# Patient Record
Sex: Female | Born: 1955 | Hispanic: No | Smoking: Never smoker
Health system: Southern US, Community
[De-identification: ages and names within clinical notes are randomized; demographics above are authoritative.]

## PROBLEM LIST (undated history)

## (undated) DIAGNOSIS — L409 Psoriasis, unspecified: Secondary | ICD-10-CM

## (undated) DIAGNOSIS — I5032 Chronic diastolic (congestive) heart failure: Secondary | ICD-10-CM

## (undated) DIAGNOSIS — E785 Hyperlipidemia, unspecified: Secondary | ICD-10-CM

## (undated) DIAGNOSIS — I10 Essential (primary) hypertension: Secondary | ICD-10-CM

## (undated) HISTORY — PX: ANAL FISSURE REPAIR: SHX2312

## (undated) HISTORY — PX: ENDOMETRIAL ABLATION: SHX621

---

## 1998-01-13 ENCOUNTER — Ambulatory Visit (HOSPITAL_COMMUNITY): Admission: RE | Admit: 1998-01-13 | Discharge: 1998-01-13 | Payer: Self-pay | Admitting: Surgery

## 1998-02-27 ENCOUNTER — Ambulatory Visit (HOSPITAL_COMMUNITY): Admission: RE | Admit: 1998-02-27 | Discharge: 1998-02-27 | Payer: Self-pay | Admitting: Surgery

## 1999-09-06 ENCOUNTER — Other Ambulatory Visit: Admission: RE | Admit: 1999-09-06 | Discharge: 1999-09-06 | Payer: Self-pay

## 2000-08-25 ENCOUNTER — Emergency Department (HOSPITAL_COMMUNITY): Admission: EM | Admit: 2000-08-25 | Discharge: 2000-08-26 | Payer: Self-pay | Admitting: Emergency Medicine

## 2001-01-12 ENCOUNTER — Encounter: Admission: RE | Admit: 2001-01-12 | Discharge: 2001-04-12 | Payer: Self-pay

## 2002-10-01 ENCOUNTER — Emergency Department (HOSPITAL_COMMUNITY): Admission: EM | Admit: 2002-10-01 | Discharge: 2002-10-01 | Payer: Self-pay | Admitting: *Deleted

## 2003-10-30 ENCOUNTER — Other Ambulatory Visit: Admission: RE | Admit: 2003-10-30 | Discharge: 2003-10-30 | Payer: Self-pay | Admitting: Obstetrics and Gynecology

## 2003-12-08 ENCOUNTER — Ambulatory Visit (HOSPITAL_COMMUNITY): Admission: RE | Admit: 2003-12-08 | Discharge: 2003-12-08 | Payer: Self-pay | Admitting: Obstetrics and Gynecology

## 2003-12-08 ENCOUNTER — Encounter (INDEPENDENT_AMBULATORY_CARE_PROVIDER_SITE_OTHER): Payer: Self-pay | Admitting: Specialist

## 2004-12-01 ENCOUNTER — Emergency Department (HOSPITAL_COMMUNITY): Admission: EM | Admit: 2004-12-01 | Discharge: 2004-12-01 | Payer: Self-pay | Admitting: Emergency Medicine

## 2006-04-09 ENCOUNTER — Emergency Department (HOSPITAL_COMMUNITY): Admission: EM | Admit: 2006-04-09 | Discharge: 2006-04-09 | Payer: Self-pay | Admitting: Emergency Medicine

## 2006-04-14 ENCOUNTER — Emergency Department (HOSPITAL_COMMUNITY): Admission: EM | Admit: 2006-04-14 | Discharge: 2006-04-14 | Payer: Self-pay | Admitting: Emergency Medicine

## 2006-11-12 ENCOUNTER — Emergency Department (HOSPITAL_COMMUNITY): Admission: EM | Admit: 2006-11-12 | Discharge: 2006-11-12 | Payer: Self-pay | Admitting: Emergency Medicine

## 2008-09-02 ENCOUNTER — Emergency Department (HOSPITAL_COMMUNITY): Admission: EM | Admit: 2008-09-02 | Discharge: 2008-09-03 | Payer: Self-pay | Admitting: Emergency Medicine

## 2009-03-17 ENCOUNTER — Emergency Department (HOSPITAL_COMMUNITY): Admission: EM | Admit: 2009-03-17 | Discharge: 2009-03-17 | Payer: Self-pay | Admitting: Emergency Medicine

## 2009-03-29 ENCOUNTER — Inpatient Hospital Stay (HOSPITAL_COMMUNITY): Admission: EM | Admit: 2009-03-29 | Discharge: 2009-03-31 | Payer: Self-pay | Admitting: Emergency Medicine

## 2009-03-29 ENCOUNTER — Encounter (INDEPENDENT_AMBULATORY_CARE_PROVIDER_SITE_OTHER): Payer: Self-pay | Admitting: Internal Medicine

## 2009-03-30 ENCOUNTER — Encounter (INDEPENDENT_AMBULATORY_CARE_PROVIDER_SITE_OTHER): Payer: Self-pay | Admitting: Internal Medicine

## 2009-03-30 ENCOUNTER — Ambulatory Visit: Payer: Self-pay | Admitting: Physical Medicine & Rehabilitation

## 2009-03-31 ENCOUNTER — Ambulatory Visit: Payer: Self-pay | Admitting: Physical Medicine & Rehabilitation

## 2009-03-31 ENCOUNTER — Inpatient Hospital Stay (HOSPITAL_COMMUNITY)
Admission: RE | Admit: 2009-03-31 | Discharge: 2009-04-09 | Payer: Self-pay | Admitting: Physical Medicine & Rehabilitation

## 2009-04-09 ENCOUNTER — Encounter: Payer: Self-pay | Admitting: Family Medicine

## 2009-05-11 ENCOUNTER — Encounter
Admission: RE | Admit: 2009-05-11 | Discharge: 2009-05-13 | Payer: Self-pay | Admitting: Physical Medicine & Rehabilitation

## 2009-05-12 ENCOUNTER — Ambulatory Visit: Payer: Self-pay | Admitting: Physical Medicine & Rehabilitation

## 2009-07-17 ENCOUNTER — Encounter: Payer: Self-pay | Admitting: *Deleted

## 2009-08-06 ENCOUNTER — Encounter: Payer: Self-pay | Admitting: Family Medicine

## 2009-08-06 ENCOUNTER — Ambulatory Visit: Payer: Self-pay | Admitting: Family Medicine

## 2009-08-06 DIAGNOSIS — I152 Hypertension secondary to endocrine disorders: Secondary | ICD-10-CM | POA: Insufficient documentation

## 2009-08-06 DIAGNOSIS — Z794 Long term (current) use of insulin: Secondary | ICD-10-CM

## 2009-08-06 DIAGNOSIS — L2089 Other atopic dermatitis: Secondary | ICD-10-CM | POA: Insufficient documentation

## 2009-08-06 DIAGNOSIS — E1165 Type 2 diabetes mellitus with hyperglycemia: Secondary | ICD-10-CM | POA: Insufficient documentation

## 2009-08-06 DIAGNOSIS — I10 Essential (primary) hypertension: Secondary | ICD-10-CM | POA: Insufficient documentation

## 2009-08-06 LAB — CONVERTED CEMR LAB
ALT: 10 units/L (ref 0–35)
AST: 11 units/L (ref 0–37)
Albumin: 4.4 g/dL (ref 3.5–5.2)
Alkaline Phosphatase: 73 units/L (ref 39–117)
BUN: 12 mg/dL (ref 6–23)
CO2: 27 meq/L (ref 19–32)
Calcium: 9.4 mg/dL (ref 8.4–10.5)
Chloride: 103 meq/L (ref 96–112)
Creatinine, Ser: 0.68 mg/dL (ref 0.40–1.20)
Glucose, Bld: 189 mg/dL — ABNORMAL HIGH (ref 70–99)
Hgb A1c MFr Bld: 8 %
Potassium: 4.5 meq/L (ref 3.5–5.3)
Sodium: 140 meq/L (ref 135–145)
Total Bilirubin: 0.3 mg/dL (ref 0.3–1.2)
Total Protein: 7.7 g/dL (ref 6.0–8.3)

## 2009-08-25 ENCOUNTER — Ambulatory Visit: Payer: Self-pay | Admitting: Family Medicine

## 2009-08-25 DIAGNOSIS — R609 Edema, unspecified: Secondary | ICD-10-CM | POA: Insufficient documentation

## 2009-08-25 DIAGNOSIS — K6289 Other specified diseases of anus and rectum: Secondary | ICD-10-CM | POA: Insufficient documentation

## 2009-09-08 ENCOUNTER — Telehealth: Payer: Self-pay | Admitting: *Deleted

## 2009-09-24 ENCOUNTER — Telehealth: Payer: Self-pay | Admitting: Family Medicine

## 2009-09-24 DIAGNOSIS — M79609 Pain in unspecified limb: Secondary | ICD-10-CM | POA: Insufficient documentation

## 2009-12-02 ENCOUNTER — Ambulatory Visit: Payer: Self-pay | Admitting: Family Medicine

## 2009-12-02 DIAGNOSIS — R269 Unspecified abnormalities of gait and mobility: Secondary | ICD-10-CM | POA: Insufficient documentation

## 2009-12-02 DIAGNOSIS — F411 Generalized anxiety disorder: Secondary | ICD-10-CM | POA: Insufficient documentation

## 2009-12-02 LAB — CONVERTED CEMR LAB: Hgb A1c MFr Bld: 11.4 %

## 2009-12-18 ENCOUNTER — Encounter: Payer: Self-pay | Admitting: *Deleted

## 2010-01-13 ENCOUNTER — Telehealth: Payer: Self-pay | Admitting: *Deleted

## 2010-01-13 ENCOUNTER — Ambulatory Visit: Payer: Self-pay | Admitting: Family Medicine

## 2010-01-13 DIAGNOSIS — Z853 Personal history of malignant neoplasm of breast: Secondary | ICD-10-CM | POA: Insufficient documentation

## 2010-01-14 ENCOUNTER — Telehealth: Payer: Self-pay | Admitting: *Deleted

## 2010-01-29 ENCOUNTER — Encounter: Payer: Self-pay | Admitting: *Deleted

## 2010-02-05 ENCOUNTER — Ambulatory Visit: Payer: Self-pay | Admitting: Family Medicine

## 2010-02-05 DIAGNOSIS — H9190 Unspecified hearing loss, unspecified ear: Secondary | ICD-10-CM | POA: Insufficient documentation

## 2010-02-05 DIAGNOSIS — R32 Unspecified urinary incontinence: Secondary | ICD-10-CM | POA: Insufficient documentation

## 2010-02-05 DIAGNOSIS — R413 Other amnesia: Secondary | ICD-10-CM | POA: Insufficient documentation

## 2010-02-10 ENCOUNTER — Telehealth: Payer: Self-pay | Admitting: Family Medicine

## 2010-02-18 ENCOUNTER — Encounter: Payer: Self-pay | Admitting: *Deleted

## 2010-03-23 ENCOUNTER — Encounter: Payer: Self-pay | Admitting: Family Medicine

## 2010-03-23 ENCOUNTER — Encounter: Payer: Self-pay | Admitting: *Deleted

## 2010-03-23 ENCOUNTER — Ambulatory Visit: Payer: Self-pay | Admitting: Family Medicine

## 2010-03-23 DIAGNOSIS — E785 Hyperlipidemia, unspecified: Secondary | ICD-10-CM | POA: Insufficient documentation

## 2010-03-23 LAB — CONVERTED CEMR LAB
Cholesterol: 331 mg/dL — ABNORMAL HIGH (ref 0–200)
HDL: 46 mg/dL (ref >39)
Hgb A1c MFr Bld: 13.8 %
LDL Cholesterol: 245 mg/dL — ABNORMAL HIGH (ref 0–99)
Total CHOL/HDL Ratio: 7.2
Triglycerides: 201 mg/dL — ABNORMAL HIGH (ref <150)
VLDL: 40 mg/dL (ref 0–40)

## 2010-03-24 ENCOUNTER — Encounter: Payer: Self-pay | Admitting: Family Medicine

## 2010-03-25 ENCOUNTER — Telehealth: Payer: Self-pay | Admitting: *Deleted

## 2010-03-25 ENCOUNTER — Telehealth: Payer: Self-pay | Admitting: Family Medicine

## 2010-03-29 ENCOUNTER — Encounter: Payer: Self-pay | Admitting: *Deleted

## 2010-04-06 ENCOUNTER — Telehealth: Payer: Self-pay | Admitting: *Deleted

## 2010-04-09 ENCOUNTER — Encounter: Payer: Self-pay | Admitting: *Deleted

## 2010-04-13 ENCOUNTER — Encounter: Payer: Self-pay | Admitting: Family Medicine

## 2010-05-12 ENCOUNTER — Encounter (INDEPENDENT_AMBULATORY_CARE_PROVIDER_SITE_OTHER): Payer: Self-pay | Admitting: *Deleted

## 2010-05-22 ENCOUNTER — Inpatient Hospital Stay (HOSPITAL_COMMUNITY)
Admission: AD | Admit: 2010-05-22 | Discharge: 2010-05-22 | Payer: Self-pay | Source: Home / Self Care | Attending: Obstetrics & Gynecology | Admitting: Obstetrics & Gynecology

## 2010-07-03 ENCOUNTER — Encounter: Payer: Self-pay | Admitting: Family Medicine

## 2010-07-04 ENCOUNTER — Encounter: Payer: Self-pay | Admitting: Family Medicine

## 2010-07-08 ENCOUNTER — Telehealth: Payer: Self-pay | Admitting: *Deleted

## 2010-07-12 ENCOUNTER — Encounter: Payer: Self-pay | Admitting: *Deleted

## 2010-07-13 NOTE — Progress Notes (Signed)
Summary: referral  Phone Note Call from Patient Call back at Home Phone [phone]   Caller: Patient Summary of Call: wants a referral to go to [facility] Initial call taken by: De Nurse,  March 25, 2010 11:59 AM     Appended Document: referral appt made pt notified

## 2010-07-13 NOTE — Progress Notes (Signed)
Summary: referral to podiatry done  Phone Note Call from Patient Call back at Home Phone [phone] Call back at [phone]   Caller: Patient Summary of Call: [facility]. Initial call taken by: De Nurse,  September 24, 2009 12:00 PM  Follow-up for Phone Call        tried to call back but phone number is invalid  message states. will MD make referral or should patient be seen here first. Follow-up by: Theresia Lo RN,  September 24, 2009 3:33 PM  Additional Follow-up for Phone Call Additional follow up Details #1::        She has been seen with toe tingling recently. Likely due to diabetic neuropathy but it is reasonable to have her seen a podiatrist.  Additional Follow-up by: [name] MD,  September 28, 2009 10:29 AM  New Problems: TOE PAIN (ICD-729.5)   New Problems: TOE PAIN (ICD-729.5)

## 2010-07-13 NOTE — Assessment & Plan Note (Signed)
Summary: HTN, DM2   Vital Signs:  Patient profile:   55 year old female Height:      63.5 inches Weight:      257 pounds BMI:     44.97 Temp:     97.5 degrees F oral Pulse rate:   106 / minute BP sitting:   168 / 96  (left arm) Cuff size:   large  Vitals Entered By: Jimmy Footman, CMA (January 13, 2010 11:10 AM)  Serial Vital Signs/Assessments:  Time      Position  BP       Pulse  Resp  Temp     By                     152/92                         Jimmy Footman, CMA  Comments: manual left arm (large) By: Jimmy Footman, CMA   CC: f/u bp, concernes about blood sugar   Primary Care Provider:  [name] MD  CC:  f/u bp and concernes about blood sugar.  History of Present Illness: Hypertension: Pt has not been taking all the meds she is suppose to take. She says she only has money for a few meds. She is tkaing amlodipine but not Lisinopril or HCTZ.   DM2: Pt says she can not afford her Metformin and has not been taking that regularly. She is taking her Lantus but says that her CBG's ahve been running between 250-359. She is using 30 units of Lanus right now. We discussed going up. She is still taking the Gabapentin for neuropathy.    Preventive: Pt needs to get a mammogram and colonoscopy. She thought that her medicaid had run out but she still has it at this time so we will try to get this done before she loses insurance. She has not gotten [facility] certified yet but will continue to work on this.   Habits & Providers  Alcohol-Tobacco-Diet     Tobacco Status: never  Current Medications (verified): 1)  Ecotrin 325 Mg Tbec (Aspirin) .... Take Once Daily 2)  Amlodipine Besylate 10 Mg Tabs (Amlodipine Besylate) .... Take 1 Dialy For High Blood Pressure 3)  Lantus 100 Unit/ml Soln (Insulin Glargine) .... Take 30 Units At Bedtime 4)  Actos 30 Mg Tabs (Pioglitazone Hcl) .Marland Kitchen.. 1 Tab A Day 5)  Simvastatin 20 Mg Tabs (Simvastatin) .... Take 1 Daily 6)  Lisinopril 40 Mg Tabs  (Lisinopril) .... Take 1 Pill Daily 7)  Triamcinolone Acetonide 0.025 % Crea (Triamcinolone Acetonide) .... Apply To Affected Area Behind Ears But Not On Face. 15 Ounce Tube 8)  Hydrochlorothiazide 25 Mg Tabs (Hydrochlorothiazide) .... Take On Pill Daily 9)  Metformin Hcl 500 Mg Tabs (Metformin Hcl) .... Take One in The Morning and One in The Evening 10)  Prodigy Lancets 28g  Misc (Lancets) .... Use One Lancet To Test Blood Sugar 11)  Prodigy No Coding Blood Gluc  Strp (Glucose Blood) .... Use One Test Strip To Test Glucose 12)  Gabapentin 300 Mg Caps (Gabapentin) .... Take One Pill 3 Times A Day  Allergies (verified): No Known Drug Allergies  Review of Systems        vitals reviewed and pertinent negatives and positives seen in HPI   Physical Exam  General:  obese, in no acute distress; alert,appropriate and cooperative throughout examination, mildly dissheveled.  Lungs:  Normal respiratory effort,  chest expands symmetrically. Lungs are clear to auscultation, no crackles or wheezes. Heart:  Normal rate and regular rhythm. S1 and S2 normal without gallop, murmur, click, rub or other extra sounds. Psych:  pt does not appear to be making clear decisions about her health, she appears to be very forgetful as well   Impression & Recommendations:  Problem # 1:  ESSENTIAL HYPERTENSION, BENIGN (ICD-401.1) Assessment Deteriorated Pt's blood pressure is getting worse because she is not taking her HCTZ or Lisinopril. She says she can not afford her meds however these should both be on the 4 dollar plan.   Her updated medication list for this problem includes:    Amlodipine Besylate 10 Mg Tabs (Amlodipine besylate) .Marland Kitchen... Take 1 dialy for high blood pressure    Lisinopril 40 Mg Tabs (Lisinopril) .Marland Kitchen... Take 1 pill daily    Hydrochlorothiazide 25 Mg Tabs (Hydrochlorothiazide) .Marland Kitchen... Take on pill daily  Orders: [facility]- Est  Level 4 ([address])  Problem # 2:  DM (ICD-250.00) Assessment:  Deteriorated Pt says that she does not have any money to buy her Metformin. She is trying to get disability but it has been denied once. She is currently only taking the Lanuts. Discussed increasing the lantus to 35 units at bedtime. Encouraged pt to get Metformin as it is on the 4 dollar plan. Given new handwritten Rx so she can find a pharmacy with the best price and get her Rx filled.   Her updated medication list for this problem includes:    Ecotrin 325 Mg Tbec (Aspirin) .Marland Kitchen... Take once daily    Lantus 100 Unit/ml Soln (Insulin glargine) .Marland Kitchen... Take 35 units at bedtime    Actos 30 Mg Tabs (Pioglitazone hcl) .Marland Kitchen... 1 tab a day    Lisinopril 40 Mg Tabs (Lisinopril) .Marland Kitchen... Take 1 pill daily    Metformin Hcl 500 Mg Tabs (Metformin hcl) .Marland Kitchen... Take one in the morning and one in the evening  Orders: [facility]- Est  Level 4 ([address]214)Future Orders: Lipid-[facility] ([address]454-[address]11) ... 01/27/2011  Complete Medication List: 1)  Ecotrin 325 Mg Tbec (Aspirin) .... Take once daily 2)  Amlodipine Besylate 10 Mg Tabs (Amlodipine besylate) .... Take 1 dialy for high blood pressure 3)  Lantus 100 Unit/ml Soln (Insulin glargine) .... Take 35 units at bedtime 4)  Actos 30 Mg Tabs (Pioglitazone hcl) .Marland Kitchen.. 1 tab a day 5)  Simvastatin 20 Mg Tabs (Simvastatin) .... Take 1 daily 6)  Lisinopril 40 Mg Tabs (Lisinopril) .... Take 1 pill daily 7)  Triamcinolone Acetonide 0.025 % Crea (Triamcinolone acetonide) .... Apply to affected area behind ears but not on face. 15 ounce tube 8)  Hydrochlorothiazide 25 Mg Tabs (Hydrochlorothiazide) .... Take on pill daily 9)  Metformin Hcl 500 Mg Tabs (Metformin hcl) .... Take one in the morning and one in the evening 10)  Prodigy Lancets 28g Misc (Lancets) .... Use one lancet to test blood sugar 11)  Prodigy No Coding Blood Gluc Strp (Glucose blood) .... Use one test strip to test glucose 12)  Gabapentin 300 Mg Caps (Gabapentin) .... Take one pill 3 times a day  Other Orders: Mammogram  (Screening) (Mammo) Gastroenterology Referral (GI)  Patient Instructions: 1)  The Metformin is on the 4 dollar plan at [facility]. [facility].  2)  We will help set you up for a mammogram and colonoscopy.  3)  You will need to make a seperate appointment for a pap smear.  4)  You can come  any morning that you have not eaten to get fasting cholesterol labs. (you can not eat 8 hours before this test).

## 2010-07-13 NOTE — Assessment & Plan Note (Signed)
Summary: memory test/kh  CC: memory test and hearing test  Hearing Screen 25db HL: Left  500 hz: No Response 1000 hz: No Response 2000 hz: No Response 4000 hz: 25db Right  500 hz: No Response 1000 hz: No Response 2000 hz: No Response 4000 hz: 25db    Primary Care Provider:  [name] MD  CC:  memory test and hearing test.  History of Present Illness: Memory: PT comes in today for a mini-mental exam. She is trying to get disability and has been concerned about her ability to work because she has a hard time remembering. I wanted to do a MMSE to determine her degree of forgetfullness. 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 I am happy to write a letter documenting what I have found.   Hearing/Gait imbalance: Pt is having a hard time with balance. She is concerned that she is having some hearing loss.   Fecal incontinence: Pt c/o fecal incontinence. She says that she was at her mom's house when she realized that she was stooling on herself and had never even felt the urge to stool. She says she often wipes the stool from her anus and then the next time she goes to the bathroom she realizes that she has stool in her underwear.  She is concerned about her sphincter tone and also needs a screening colonoscopy. Will try to get both evaluated.     Habits & Providers  Alcohol-Tobacco-Diet     Tobacco Status: never  Allergies (verified): No Known Drug Allergies  Review of Systems       no reported complaints  Physical Exam  General:  Well-developed,well-nourished,in no acute distress; alert,appropriate and cooperative throughout examination Psych:  Cognition and judgment appear intact. Alert and cooperative with normal attention span and concentration. No apparent delusions, illusions, hallucinations   Impression & Recommendations:  Problem # 1:  MEMORY LOSS  (ICD-780.93) Assessment Unchanged Pt scored a 27/30 on the MMSE. She is able to remember things but it sometimes takes her a while to get the word out. I am willing to write a letter stating that her MMSE is 27 but I don't feel that this patient is unable to work because of this.    Orders: [facility]- Est Level  3 ([address])  Problem # 2:  UNSTEADY GAIT (ICD-781.2) Assessment: Deteriorated Pt has an unsteady gate. She has a h/o a CVA which she has mostly recovered from. However she feels that she may be unsteady because she has a hearing loss. After today's screening test I agree and plan to refer her to an audiologist for formal testing.   Orders: Hearing- [facility] (92551) [facility]- Est Level  3 [phone]) Audiology (Audio)  Problem # 3:  INCONTINENCE (ICD-788.30) Assessment: New Pt is concerned that she is lacking in sphincter tone. She is in need of a colonosocpy and I asked her to ask the GI MD questions about her stool incontinence at the colonoscopy. If there is nothing the GI doc wants to add  I would consider bulking up her diet with some fiber to see if we can add some volume to her stools so they stay more formed.   Orders: Colonoscopy (Colon) [facility]- Est Level  3 (09811)  Complete Medication List: 1)  Ecotrin 325 Mg Tbec (Aspirin) .... Take once daily 2)  Amlodipine Besylate 10 Mg Tabs (Amlodipine besylate) .... Take 1  dialy for high blood pressure 3)  Lantus 100 Unit/ml Soln (Insulin glargine) .... Take 35 units at bedtime 4)  Actos 30 Mg Tabs (Pioglitazone hcl) .Marland Kitchen.. 1 tab a day 5)  Simvastatin 20 Mg Tabs (Simvastatin) .... Take 1 daily 6)  Lisinopril 40 Mg Tabs (Lisinopril) .... Take 1 pill daily 7)  Triamcinolone Acetonide 0.025 % Crea (Triamcinolone acetonide) .... Apply to affected area behind ears but not on face. 15 ounce tube 8)  Hydrochlorothiazide 25 Mg Tabs (Hydrochlorothiazide) .... Take on pill daily 9)  Metformin Hcl 500 Mg Tabs (Metformin hcl) .... Take one in the morning and one in  the evening 10)  Prodigy Lancets 28g Misc (Lancets) .... Use one lancet to test blood sugar 11)  Prodigy No Coding Blood Gluc Strp (Glucose blood) .... Use one test strip to test glucose 12)  Gabapentin 300 Mg Caps (Gabapentin) .... Take one pill 3 times a day  Other Orders: Mammogram (Screening) (Mammo)  Patient Instructions: 1)  You did well on your mental memory test today.  2)  Please let me know if your lawyer needs a letter written.  3)  You got a sample Lanus pen today.  4)  You are getting a hearing test today.  5)  We will call you with info about a mammogram.

## 2010-07-13 NOTE — Progress Notes (Signed)
  Phone Note Outgoing Call   Call placed by: Jimmy Footman, CMA,  January 13, 2010 3:53 PM Summary of Call: Called pt to inform her of an appt set at [facility] on 8/15 @ 2:45. Pt phone is unable to accept any phone calls. Initial call taken by: Jimmy Footman, CMA,  January 13, 2010 3:54 PM

## 2010-07-13 NOTE — Letter (Signed)
Summary: Generic Letter  [facility]  [address]   [place], [place] [address]   Phone: [phone]  Fax: [phone]    03/23/2010  Whitney Sloan [address] [place], [place]  [address]  Dear [name],   I have made appointments for you for an Colonoscopy and a Mammogram.  Your colonscopy appoinment has been scheduled for March 30, 2010 at 10:00am with [facility] [address] [phone], [phone].   Your mammogram is scheduled for October 19,2011 at 4:40pm with [facility]  located at [address], ph. [phone].    If you cannot make either of these appoinments please be sure to call 24 hours in advance to cancell or reschedule.  Thank you in advance for your time and attention to this matter.     Sincerely,   Loralee Pacas CMA

## 2010-07-13 NOTE — Progress Notes (Signed)
Summary: Whitney Sloan  Phone Note Call from Patient Call back at Home Phone [phone] Call back at [phone]   Caller: Patient Summary of Call: Pt is checking on a couple of referrals that she was supposed to have.  One for a colonoscopy and one for [facility]. Initial call taken by: Clydell Hakim,  March 25, 2010 10:06 AM  Follow-up for Phone Call        Called and spoke with patient ad gave her the info about colonoscopy and mammogram. Patient informed me that she cannot make either one. I gave her the numbes to call and reschedule appts. Follow-up by: Jimmy Footman, CMA,  March 25, 2010 11:58 AM

## 2010-07-13 NOTE — Letter (Signed)
Summary: Generic Letter  [facility]  [address]   [place], [place] [address]   Phone: [phone]  Fax: [phone]    02/18/2010  Whitney Sloan [address] [place], [place]  [address]  Dear Ms. Pendergrass,     I have been unable to contact you by phone. Dr. Clotilde Dieter requested we schedule an appointment with GI specialist for you to have a screening colonscopy. An appointment has been scheduled with Dr. Elnoria Howard for 02/24/2010 at 8:30 AM. This is just to discuss the procedure and to get instructions for the prep. Dr. Haywood Pao office is located at [address] , [place]. Phone number is [phone]. If this is not convenient please call their office to reschedule.      Thank you.       Sincerely,   Theresia Lo RN

## 2010-07-13 NOTE — Miscellaneous (Signed)
Summary: Appt rescheduled  Patient was late for appt.  Sicne she did show up she was allowed to reschedule.  Dennison Nancy RN  July 17, 2009 11:28 AM

## 2010-07-13 NOTE — Assessment & Plan Note (Signed)
Summary: NEW PT, HTN, DM, Ezcema   Vital Signs:  Patient profile:   55 year old female Height:      63.5 inches Weight:      258. pounds BMI:     45.15 Temp:     97.4 degrees F oral Pulse rate:   90 / minute BP sitting:   164 / 90  (left arm) Cuff size:   large  Vitals Entered By: Gladstone Pih (August 06, 2009 10:16 AM) CC: NP est Is Patient Diabetic? Yes Did you bring your meter with you today? No Pain Assessment Patient in pain? no        Primary Care Provider:  [name] MD  CC:  NP est.  History of Present Illness: 55 y/o new patient comes in to establish care. She was seen in the [facility] in Oct, 2010 for left sided weakness. 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 HGA1c at the time was 12.5. She did 9 days of inpatient rehab and improved significantly. She mentions that she still walks a little more slowly than before and sometimes feels off balance and seems to fatigue more quickly than before but is doing much better than she was after her stroke.   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  Menopause 2007  Past Surgical History: C-section 1985 and 1993 Tubal ligation 1993 Endometrial ablation 2006  Family History: Father: unknown Mother: alive, age 55, no health problems that she knows of.  Sister: hypertension, sleep apnea  Social History: lives with daughter Annella Prowell 07-23-1991), no pets, not employed, SSDI case pending, likes puzzles, TV, movies, writing, reading, dancing, black and [place], some college, sometimes has  difficulty understanding medical info, divorced, heterosexual, owns car, renting house, no tobacco, no drugs, alcohol 1 drink a year, occasionally exercises at home (walks on treadmill), no h/o STI'sSmoking Status:  never  Review of Systems        vitals reviewed and pertinent negatives and positives seen in HPI   Physical Exam  General:  alert, well-hydrated, appropriate dress, cooperative to examination, and overweight-appearing.   Head:  Normocephalic and atraumatic without obvious abnormalities. No apparent alopecia or balding. Eyes:  No corneal or conjunctival inflammation noted. EOMI. Perrla. Funduscopic exam benign, without hemorrhages, exudates or papilledema. Vision grossly normal. Ears:  External ear exam shows no significant lesions or deformities.  Otoscopic examination reveals clear canals, tympanic membranes are intact bilaterally without bulging, retraction, inflammation or discharge. Hearing is grossly normal bilaterally. Nose:  External nasal examination shows no deformity or inflammation. Nasal mucosa are pink and moist without lesions or exudates. Mouth:  Oral mucosa and oropharynx without lesions or exudates.  Teeth in good repair. Lungs:  Normal respiratory effort, chest expands symmetrically. Lungs are clear to auscultation, no crackles or wheezes. Heart:  Normal rate and regular rhythm. S1 and S2 normal without gallop, murmur, click, rub or other extra sounds. Abdomen:  Bowel sounds positive,abdomen soft and non-tender without masses, organomegaly or hernias noted. obese Skin:  ezcema rash behind bilateral ears Psych:  Cognition and judgment appear intact. Alert and cooperative with normal attention span and concentration.  No apparent delusions, illusions, hallucinations   Impression & Recommendations:  Problem # 1:  DM (ICD-250.00) Assessment Improved Pt is here for the first time today. Her A1c is better than her hospital record A1c in Oct 2010. She has done well on  the insulin and I would encourage her to stay on it for a while (even though she would like to stay off of it for a while if her numbers come down).   Her updated medication list for this problem includes:    Ecotrin 325 Mg Tbec (Aspirin) .Marland Kitchen... Take once daily    Lantus 100 Unit/ml Soln (Insulin glargine) .Marland Kitchen... Take 30 units at bedtime    Actos 30 Mg Tabs (Pioglitazone hcl) .Marland Kitchen... 1 tab a day    Lisinopril 40 Mg Tabs (Lisinopril) .Marland Kitchen... Take 1 pill daily  Orders: A1C-[facility] ([address]) Comp Met-[facility] ([address])  Problem # 2:  ESSENTIAL HYPERTENSION, BENIGN (ICD-401.1) Assessment: Deteriorated Pt has an elevated BP. Plan to recheck at next visit and if needed add a 3rd agent.   Her updated medication list for this problem includes:    Amlodipine Besylate 10 Mg Tabs (Amlodipine besylate) .Marland Kitchen... Take 1 dialy for hig blood pressure    Lisinopril 40 Mg Tabs (Lisinopril) .Marland Kitchen... Take 1 pill daily  Problem # 3:  DERMATITIS, ATOPIC (ICD-691.8) Assessment: Deteriorated Pt has skin lesions behind her ears. Says she use to use this cream and would like to use it again.   Her updated medication list for this problem includes:    Triamcinolone Acetonide 0.025 % Crea (Triamcinolone acetonide) .Marland Kitchen... Apply to affected area behind ears but not on face.  Medications Added to Medication List This Visit: 1)  Ecotrin 325 Mg Tbec (Aspirin) .... Take once daily 2)  Amlodipine Besylate 10 Mg Tabs (Amlodipine besylate) .... Take 1 dialy for hig blood pressure 3)  Lantus 100 Unit/ml Soln (Insulin glargine) .... Take 30 units at bedtime 4)  Actos 30 Mg Tabs (Pioglitazone hcl) .Marland Kitchen.. 1 tab a day 5)  Simvastatin 20 Mg Tabs (Simvastatin) .... Take 1 daily 6)  Lisinopril 40 Mg Tabs (Lisinopril) .... Take 1 pill daily 7)  Triamcinolone Acetonide 0.025 % Crea (Triamcinolone acetonide) .... Apply to affected area behind ears but not on face.  Complete Medication List: 1)  Ecotrin 325 Mg Tbec (Aspirin) .... Take once daily 2)   Amlodipine Besylate 10 Mg Tabs (Amlodipine besylate) .... Take 1 dialy for hig blood pressure 3)  Lantus 100 Unit/ml Soln (Insulin glargine) .... Take 30 units at bedtime 4)  Actos 30 Mg Tabs (Pioglitazone hcl) .Marland Kitchen.. 1 tab a day 5)  Simvastatin 20 Mg Tabs (Simvastatin) .... Take 1 daily 6)  Lisinopril 40 Mg Tabs (Lisinopril) .... Take 1 pill daily 7)  Triamcinolone Acetonide 0.025 % Crea (Triamcinolone acetonide) .... Apply to affected area behind ears but not on face.  Other Orders: Tdap => 1yrs IM (91478) Admin 1st Vaccine (29562)  Patient Instructions: 1)  Amek an appointment for 1-2 weeks.  2)  We will look at your fissue at that time.  3)  Make an appointment to get a mammogram.  4)  Keep your sugar log, bring with you to next appointment.  Prescriptions: TRIAMCINOLONE ACETONIDE 0.025 % CREA (TRIAMCINOLONE ACETONIDE) apply to affected area behind ears but not on face.  #1 x 1   Entered and Authorized by:   [name] MD   Signed by:   [name] MD on 08/06/2009   Method used:   Electronically to  [facility] [address] [phone]* (retail)       [address]       [place], [place]  [address]       Ph: [phone]       Fax: [phone]   RxID:   [id number] LISINOPRIL 40 MG TABS (LISINOPRIL) take 1 pill daily  #31 x 11   Entered and Authorized by:   [name] MD   Signed by:   [name] MD on 08/06/2009   Method used:   Historical   RxID:   [id number] SIMVASTATIN 20 MG TABS (SIMVASTATIN) take 1 daily  #31 x 11   Entered and Authorized by:   [name] MD   Signed by:   [name] MD on 08/06/2009   Method used:   Historical   RxID:   [id number] ACTOS 30 MG TABS (PIOGLITAZONE HCL) 1 tab a day  #31 x 0   Entered and Authorized by:   [name] MD   Signed by:   [name] MD on 08/06/2009   Method used:   Historical   RxID:   [id number] LANTUS 100 UNIT/ML SOLN (INSULIN GLARGINE) take 30 units at bedtime  #1 x 11    Entered and Authorized by:   [name] MD   Signed by:   [name] MD on 08/06/2009   Method used:   Historical   RxID:   [id number] ECOTRIN 325 MG TBEC (ASPIRIN) take once daily  #31 x 11   Entered and Authorized by:   [name] MD   Signed by:   [name] MD on 08/06/2009   Method used:   Historical   RxID:   [id number]    Immunizations Administered:  Tetanus Vaccine:    Vaccine Type: Tdap    Site: right deltoid    Mfr: GlaxoSmithKline    Dose: 0.5 ml    Route: IM    Given by: Loralee Pacas CMA    Exp. Date: 08/08/2011    Lot #: [id number]    VIS given: 05/01/07 version given August 06, 2009.  Laboratory Results   Blood Tests   Date/Time Received: August 06, 2009 11:30 AM  Date/Time Reported: August 06, 2009 11:56 AM   HGBA1C: 8.0%   (Normal Range: Non-Diabetic - 3-6%   Control Diabetic - 6-8%)  Comments: .......test performed by........Marland Kitchen [place], SMA

## 2010-07-13 NOTE — Progress Notes (Signed)
Summary: phn msg  Phone Note Call from Patient Call back at Home Phone [phone]   Caller: Patient Summary of Call: [facility] but not sure what type to tell them she needed. Initial call taken by: Clydell Hakim,  September 08, 2009 3:48 PM    pt called and informed of the type of mammogram that she needed.Loralee Pacas CMA  September 08, 2009 5:09 PM

## 2010-07-13 NOTE — Miscellaneous (Signed)
Summary: med rec  Clinical Lists Changes  Medications: Changed medication from METFORMIN HCL 500 MG TABS (METFORMIN HCL) take one in the morning and one in the evening to METFORMIN HCL 1000 MG TABS (METFORMIN HCL) take 1 tab in the morning and 1 tab in the evening. - Signed Changed medication from ACTOS 30 MG TABS (PIOGLITAZONE HCL) 1 tab a day to ACTOS 30 MG TABS (PIOGLITAZONE HCL) 1 tab a day - Signed Added new medication of ASPIR-LOW 81 MG TBEC (ASPIRIN) 1 Po qDay - Signed Changed medication from SIMVASTATIN 20 MG TABS (SIMVASTATIN) take 1 daily to SIMVASTATIN 40 MG TABS (SIMVASTATIN) take 1 by mouth qHS - Signed Removed medication of TRIAMCINOLONE ACETONIDE 0.025 % CREA (TRIAMCINOLONE ACETONIDE) apply to affected area behind ears but not on face. 15 ounce tube Rx of METFORMIN HCL 1000 MG TABS (METFORMIN HCL) take 1 tab in the morning and 1 tab in the evening.;  #60 x 5;  Signed;  Entered by: [name] MD;  Authorized by: [name] MD;  Method used: Faxed to [facility], [address], [place], [place]  [address], Ph: [phone], Fax: [phone] Rx of ACTOS 30 MG TABS (PIOGLITAZONE HCL) 1 tab a day;  #30 x 5;  Signed;  Entered by: [name] MD;  Authorized by: [name] MD;  Method used: Faxed to [facility], [address], [place], [place]  [address], Ph: [phone], Fax: [phone] Rx of ASPIR-LOW 81 MG TBEC (ASPIRIN) 1 Po qDay;  #30 x 5;  Signed;  Entered by: [name] MD;  Authorized by: [name] MD;  Method used: Faxed to [facility], [address], [place], [place]  [address], Ph: [phone], Fax: [phone] Rx of GABAPENTIN 300 MG CAPS (GABAPENTIN) take one pill 3 times a day;  #90 x 5;  Signed;  Entered by: [name] MD;  Authorized by: [name] MD;  Method used: Faxed to [facility], [address], [place], [place]  [address], Ph: [phone], Fax: [phone] Rx of  HYDROCHLOROTHIAZIDE 25 MG TABS (HYDROCHLOROTHIAZIDE) take on pill daily;  #31 x 5;  Signed;  Entered by: [name] MD;  Authorized by: [name] MD;  Method used: Faxed to [facility], [address], [place], [place]  [address], Ph: [phone], Fax: [phone] Rx of SIMVASTATIN 40 MG TABS (SIMVASTATIN) take 1 by mouth qHS;  #30 x 5;  Signed;  Entered by: [name] MD;  Authorized by: [name] MD;  Method used: Faxed to [facility], [address], [place], [place]  [address], Ph: [phone], Fax: [phone]    Prescriptions: SIMVASTATIN 40 MG TABS (SIMVASTATIN) take 1 by mouth qHS  #30 x 5   Entered and Authorized by:   [name] MD   Signed by:   [name] MD on 04/13/2010   Method used:   Faxed to ...       [facility] (retail)       [address]       [place], [place]  [address]       Ph: [phone]       Fax: [phone]   RxID:   [id number] HYDROCHLOROTHIAZIDE 25 MG TABS (HYDROCHLOROTHIAZIDE) take on pill daily  #31 x 5   Entered and Authorized by:   [name] MD   Signed by:   [name] MD on 04/13/2010   Method  used:   Faxed to ...       [facility] (retail)       [address]       [place], [place]  [address]       Ph: [phone]       Fax: [phone]   RxID:   [id number] GABAPENTIN 300 MG CAPS (GABAPENTIN) take one pill 3 times a day  #90 x 5   Entered and Authorized by:   [name] MD   Signed by:   [name] MD on 04/13/2010   Method used:   Faxed to ...       [facility] (retail)       [address]       [place], [place]  [address]       Ph: [phone]       Fax: [phone]   RxID:   [id number] ASPIR-LOW 81 MG TBEC (ASPIRIN) 1 Po qDay  #30 x 5   Entered and Authorized by:   [name] MD   Signed by:   [name] MD on 04/13/2010   Method used:   Faxed to ...       [facility] (retail)       [address]       [place], [place]  [address]       Ph: [phone]       Fax: [phone]   RxID:   [id number] ACTOS 30 MG TABS (PIOGLITAZONE HCL) 1 tab a day  #30 x 5   Entered and Authorized by:   [name] MD   Signed by:   [name] MD on 04/13/2010   Method used:   Faxed to ...       [facility] (retail)       [address]       [place], [place]  [address]       Ph: [phone]       Fax: [phone]   RxID:   [id number] METFORMIN HCL 1000 MG TABS (METFORMIN HCL) take 1 tab in the morning and 1 tab in the evening.  #60 x 5   Entered and Authorized by:   [name] MD   Signed by:   [name] MD on 04/13/2010   Method used:   Faxed to ...       [facility] (retail)       [address]       [place], [place]  [address]       Ph: [phone]       Fax: [phone]   RxID:   [id number]

## 2010-07-13 NOTE — Letter (Signed)
Summary: Results Follow-up Letter  [facility]  [address]   [place], [place] [address]   Phone: [phone]  Fax: [phone]    03/24/2010  [address] [place], [place]  [address]  Dear Ms. Loa,   The following are the results of your recent test(s):   Cholesterol          [H]  331 mg/dL          should be    5-784       Triglyceride         [H]  201 mg/dL          should be    <696   HDL Cholesterol           46 mg/dL           should be     >39   Total Chol/HDL Ratio      7.2 Ratio        should be about 3  VLDL Cholesterol (Calc)    40 mg/dL           should be     0-40  LDL Cholesterol (Calc) [H]  245 mg/dL    should be       2-95   You have very high cholesterol and this puts you at a very high risk for heart attack and stroke. You must start taking a medication now to get your cholesterol under control. I am sending in a prescription to your pharmacy. Please call my office if you have any questions.    Sincerely,  [name] MD [facility]

## 2010-07-13 NOTE — Letter (Signed)
Summary: Generic Letter  [facility]  [address]   [place], [place] [address]   Phone: [phone]  Fax: [phone]    03/29/2010  Whitney Sloan [address] [place], [place]  [address]  Dear [name],  I have made several attempts to reach you by phone.  I have made an appointment for you on April 15, 2010 at 930 am to go to [facility] [address], ph.  [phone]. If you cannot keep this appoinment please give their office a call 24 hours prior to your appointment so that you will not incur a fee for not keeping your appointment.      Thank you.   Sincerely,   Loralee Pacas CMA

## 2010-07-13 NOTE — Progress Notes (Signed)
Summary: Appt. cancelled with Dr. Evette Cristal  Phone Note From Other Clinic   Action Taken: Phone Call Completed Summary of Call: FYI. Dr. Luan Moore office([facility]) had to cancel Whitney Sloan's appointment because patient did not have her copay.  Pt. had [facility] card also, but they do not accept the [facility] card.  Initial call taken by: Terese Door,  April 06, 2010 11:33 AM  Follow-up for Phone Call        Oh, no, I think this was for a colonoscopy and she needs one. I may have to have her come back in a do the fecal occult blood cards. Can we mail one to her and have her mail it back? Or have her come pick one up? Please let me know.  Follow-up by: [name] MD,  April 07, 2010 11:06 AM  Additional Follow-up for Phone Call Additional follow up Details #1::        Tried calling patient to inform her that she can come by office to pick up hemoccult cards then mail them back to the office but got recording, "voicemail full", will try again later. Additional Follow-up by: Garen Grams LPN,  April 07, 2010 4:43 PM    Additional Follow-up for Phone Call Additional follow up Details #2::    Tried calling patient again, still unable to leave message will send letter to patient informing her to pick up stool cards. Follow-up by: Garen Grams LPN,  April 09, 2010 10:18 AM

## 2010-07-13 NOTE — Assessment & Plan Note (Signed)
Summary: anxiety/stress, unsteady gait, DM, HTN   Vital Signs:  Patient profile:   55 year old female Weight:      261.9 pounds Temp:     98.1 degrees F oral Pulse rate:   89 / minute Pulse rhythm:   regular BP sitting:   142 / 83  (left arm) Cuff size:   large  Vitals Entered By: Loralee Pacas CMA (December 02, 2009 8:37 AM)  Primary Care Provider:  [name] MD  CC:  stress and weakness.  History of Present Illness: Stress: Pt is very distraught about being denied disability. She spoke for at least 20 minutes about how she has tried to get many jobs and keeps being denied. She is about to loose her child support and now will be losing Medicaid as well. She is very concerned about how she will make her rent.   Weakness: Pt describes situation where she was outside in the heat for at least an hour and felt very weak after this. She also notes that after her stroke in Oct 2010 that she has some weakness and feels wobbly even at home. She says she also has stiff joints. Was using  cane before and doens't really want to go back to using a cane but thinks it might be the best option.   Diabetes: Pt ran out of meds for 10 days and has just gotten her meds about 4 days ago. Her CBG this mornin was 366. She is not taking the meds as prescribed because of money considerations.   Hypertension: Pt has been tkaing BP meds. Her BP is fairly well controlled today but could be better. When she gets [facility] and MAP lined up will plan to work on getting her under better control.   Habits & Providers  Alcohol-Tobacco-Diet     Tobacco Status: never  Current Medications (verified): 1)  Ecotrin 325 Mg Tbec (Aspirin) .... Take Once Daily 2)  Amlodipine Besylate 10 Mg Tabs (Amlodipine Besylate) .... Take 1 Dialy For High Blood Pressure 3)  Lantus 100 Unit/ml Soln (Insulin Glargine) .... Take 30 Units At Bedtime 4)  Actos 30 Mg Tabs (Pioglitazone Hcl) .Marland Kitchen.. 1 Tab A Day 5)  Simvastatin 20 Mg  Tabs (Simvastatin) .... Take 1 Daily 6)  Lisinopril 40 Mg Tabs (Lisinopril) .... Take 1 Pill Daily 7)  Triamcinolone Acetonide 0.025 % Crea (Triamcinolone Acetonide) .... Apply To Affected Area Behind Ears But Not On Face. 15 Ounce Tube 8)  Hydrochlorothiazide 25 Mg Tabs (Hydrochlorothiazide) .... Take On Pill Daily 9)  Metformin Hcl 500 Mg Tabs (Metformin Hcl) .... Take One in The Morning and One in The Evening 10)  Prodigy Lancets 28g  Misc (Lancets) .... Use One Lancet To Test Blood Sugar 11)  Prodigy No Coding Blood Gluc  Strp (Glucose Blood) .... Use One Test Strip To Test Glucose 12)  Gabapentin 300 Mg Caps (Gabapentin) .... Take One Pill 3 Times A Day  Allergies (verified): No Known Drug Allergies  Review of Systems        vitals reviewed and pertinent negatives and positives seen in HPI   Physical Exam  General:  Obese, in no acute distress; alert,appropriate and cooperative throughout examination Psych:  anxious, pressure speech   Impression & Recommendations:  Problem # 1:  ANXIETY STATE, UNSPECIFIED (ICD-300.00) Assessment New Pt is very anxious about her current financial situation. Offered her [facility] and MAP programs to help her get medical assistance and access. Spoke with her face  to face bout 20 minutes about this one issue.   Orders: [facility]- Est  Level 4 ([address])  Problem # 2:  UNSTEADY GAIT (ICD-781.2) Assessment: Unchanged Pt has been feeling "wobbly" ever since she had her stroke. Suggested using the cane again. Would like to get her into PT for balance training but will plan to set this up after she gets [facility].   Orders: [facility]- Est  Level 4 ([address])  Problem # 3:  DM (ICD-250.00) Assessment: Deteriorated Pt has been having high CBG's. She has not been getting or taking her meds like she should. She can not afford the Lantus sometimes. Pt given info to get MAP set up. Will see her back in 2 months at which time she will hopefully have it set up so we can  get her medications.   Her updated medication list for this problem includes:    Ecotrin 325 Mg Tbec (Aspirin) .Marland Kitchen... Take once daily    Lantus 100 Unit/ml Soln (Insulin glargine) .Marland Kitchen... Take 30 units at bedtime    Actos 30 Mg Tabs (Pioglitazone hcl) .Marland Kitchen... 1 tab a day    Lisinopril 40 Mg Tabs (Lisinopril) .Marland Kitchen... Take 1 pill daily    Metformin Hcl 500 Mg Tabs (Metformin hcl) .Marland Kitchen... Take one in the morning and one in the evening  Orders: [facility] ([address]) [facility]- Est  Level 4 ([address])  Problem # 4:  ESSENTIAL HYPERTENSION, BENIGN (ICD-401.1) Assessment: Unchanged Pt is fairly well controlled on these meds. Would like to optimize for BP goal of 130/80 but will wait to change meds until she has [employer] and MAP figured out.   Her updated medication list for this problem includes:    Amlodipine Besylate 10 Mg Tabs (Amlodipine besylate) .Marland Kitchen... Take 1 dialy for high blood pressure    Lisinopril 40 Mg Tabs (Lisinopril) .Marland Kitchen... Take 1 pill daily    Hydrochlorothiazide 25 Mg Tabs (Hydrochlorothiazide) .Marland Kitchen... Take on pill daily  Orders: [facility]- Est  Level 4 ([address])  Complete Medication List: 1)  Ecotrin 325 Mg Tbec (Aspirin) .... Take once daily 2)  Amlodipine Besylate 10 Mg Tabs (Amlodipine besylate) .... Take 1 dialy for high blood pressure 3)  Lantus 100 Unit/ml Soln (Insulin glargine) .... Take 30 units at bedtime 4)  Actos 30 Mg Tabs (Pioglitazone hcl) .Marland Kitchen.. 1 tab a day 5)  Simvastatin 20 Mg Tabs (Simvastatin) .... Take 1 daily 6)  Lisinopril 40 Mg Tabs (Lisinopril) .... Take 1 pill daily 7)  Triamcinolone Acetonide 0.025 % Crea (Triamcinolone acetonide) .... Apply to affected area behind ears but not on face. 15 ounce tube 8)  Hydrochlorothiazide 25 Mg Tabs (Hydrochlorothiazide) .... Take on pill daily 9)  Metformin Hcl 500 Mg Tabs (Metformin hcl) .... Take one in the morning and one in the evening 10)  Prodigy Lancets 28g Misc (Lancets) .... Use one lancet to test blood sugar 11)  Prodigy No Coding  Blood Gluc Strp (Glucose blood) .... Use one test strip to test glucose 12)  Gabapentin 300 Mg Caps (Gabapentin) .... Take one pill 3 times a day  Patient Instructions: 1)  I am sorry for your troubles with losing Medicaid and working on disability.  2)  There are several things we can do to inprove your health but I want to wait until you get the [facility] health coverage worked out and the [employer] worked out.  3)  Please work on these since you will need a Mammogram, Colonoscopy, Pap smear, Fasting Lipid Panal  and other diabetes labs in the near future.  4)  All our office and make an appointment when you have these two programs set up. Please make sure to have it done in the next 2 months.  5)  Use your cane when you are walking to give added stability.    Prevention & Chronic Care Immunizations   Influenza vaccine: Not documented    Tetanus booster: 08/06/2009: Tdap    Pneumococcal vaccine: Not documented  Colorectal Screening   Hemoccult: Not documented    Colonoscopy: Not documented  Other Screening   Pap smear: Not documented    Mammogram: Not documented  Reports requested:  Smoking status: never  (12/02/2009)  Diabetes Mellitus   HgbA1C: 11.4  (12/02/2009)    Eye exam: Not documented   Last eye exam report requested.    Foot exam: yes  (08/25/2009)   High risk foot: Not documented   Foot care education: Not documented    Urine microalbumin/creatinine ratio: Not documented   Urine microalbumin action/deferral: Not indicated    Diabetes flowsheet reviewed?: Yes   Progress toward A1C goal: Deteriorated  Lipids   Total Cholesterol: Not documented   LDL: Not documented   LDL Direct: Not documented   HDL: Not documented   Triglycerides: Not documented  Hypertension   Last Blood Pressure: 142 / 83  (12/02/2009)   Serum creatinine: 0.68  (08/06/2009)   Serum potassium 4.5  (08/06/2009)  Self-Management Support :    Diabetes  self-management support: Not documented    Hypertension self-management support: Not documented   Nursing Instructions: Request report of last diabetic eye exam    Laboratory Results   Blood Tests   Date/Time Received: December 02, 2009 8:46 AM  Date/Time Reported: December 02, 2009 9:46 AM   HGBA1C: 11.4%   (Normal Range: Non-Diabetic - 3-6%   Control Diabetic - 6-8%)  Comments: ...............test performed by......Marland KitchenBonnie A. [place], MLS (ASCP)cm

## 2010-07-13 NOTE — Assessment & Plan Note (Signed)
Summary: HTN, DM2, HLD, can't afford meds   Vital Signs:  Patient profile:   55 year old female Height:      63.5 inches Weight:      248 pounds BMI:     43.40 Temp:     98.3 degrees F oral Pulse rate:   88 / minute BP sitting:   144 / 80  (left arm) Cuff size:   large  Vitals Entered By: Tessie Fass CMA (March 23, 2010 9:51 AM) CC: HTN, DM2, disability Is Patient Diabetic? Yes Pain Assessment Patient in pain? no        Primary Care Provider:  [name] MD  CC:  HTN, DM2, and disability.  History of Present Illness: hypertension: Pt is not able to afford all her BP meds. She is only taking Lisinopril and Amlodipine. Her BP has been ranging in the 140's/110's. Her current BP today was 144/80. She says that she is trying to get some meds through [employer] a medicaid program to help her get free meds. She has a h/o pontein stroke in Oct 2010 and needs to keep her BP under control.  DM2: Pt is also not able to afford her DM meds. She is only taking Metformin. Her CBG's have been ranging 250-350. She has strips and lancets and is checking her blood sugars but is not able to get her blood glucose down.  Her current A1c is 13.8. The patient does not exercise nor eat healthfully.  HLD: Pt has h/o stroke and is not currently taking her statin because she can not afford it. Plan to retest her FLP today and encourage her to get on a statin. My fear is that she will have another stroke if she is not able to get many of these medications.   Disability: This patient has been trying to get disability for the last 1 year since her stroke. She currently has an appeal pending with the disability office. She is very forgetful and has to write everything down to discuss with me and even then it is hard to tell what she is getting at during our appointments. I am beginning to think that she may not be able to hold down a job. I will do what I can to assist in her case.    Preventive: Pt had a colonosocpy on 02/05/10 and a mammogram on 02/05/10 ordered but she has not done them yet. We will work on setting these up for her.   Habits & Providers  Alcohol-Tobacco-Diet     Tobacco Status: never  Allergies (verified): 1)  ! 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  Menopause 2007  Review of Systems       no c/o cough, SOB, headache, diaphoresis   Physical Exam  General:  Well-developed,well-nourished,in no acute distress; alert,appropriate and cooperative throughout examination Lungs:  Normal respiratory effort, chest expands symmetrically. Lungs are clear to auscultation, no crackles or wheezes. Heart:  Normal rate and regular rhythm. S1 and S2 normal without gallop, murmur, click, rub or other extra sounds.  Diabetes Management Exam:    Foot Exam (with socks and/or shoes not present):       Sensory-Monofilament:  Left foot: normal          Right foot: normal       Inspection:          Left foot: normal          Right foot: normal       Nails:          Left foot: normal          Right foot: normal   Impression & Recommendations:  Problem # 1:  ESSENTIAL HYPERTENSION, BENIGN (ICD-401.1) Assessment Deteriorated Pt is only taking 2 out of her 3 BP meds, she has elevated BP's today. encouraged her to take all the meds but she says she can not afford them. Even those that are on the 4 dollar plan are too much for her. She is working with [employer] to get her meds.   Her updated medication list for this problem includes:    Amlodipine Besylate 10 Mg Tabs (Amlodipine besylate) .Marland Kitchen... Take 1 dialy for high blood  pressure    Lisinopril 40 Mg Tabs (Lisinopril) .Marland Kitchen... Take 1 pill daily    Hydrochlorothiazide 25 Mg Tabs (Hydrochlorothiazide) .Marland Kitchen... Take on pill daily  Orders: [facility]- Est Level  3 (41660)  Problem # 2:  DM (ICD-250.00) Assessment: Deteriorated Pt is only taking Metformin and I suspect she is not really taking this on a consistant basis. Her A1c is worse today at 13.8. She was given Lantus in the clinic today and was given instruction on how to use it by the pharmacy student. She had not been using her lanuts correctly before when she had it. Hopefully her CBG's will come down and she will be able to get her other meds through the [employer] program.   Her updated medication list for this problem includes:    Ecotrin 325 Mg Tbec (Aspirin) .Marland Kitchen... Take once daily    Lantus 100 Unit/ml Soln (Insulin glargine) .Marland Kitchen... Take 35 units at bedtime    Actos 30 Mg Tabs (Pioglitazone hcl) .Marland Kitchen... 1 tab a day    Lisinopril 40 Mg Tabs (Lisinopril) .Marland Kitchen... Take 1 pill daily    Metformin Hcl 500 Mg Tabs (Metformin hcl) .Marland Kitchen... Take one in the morning and one in the evening  Orders: A1C-[facility] (63016) Lipid-[facility] ([address]557) Glucose Cap-[facility] (32202) [facility]- Est Level  3 (54270)  Problem # 3:  HYPERLIPIDEMIA (ICD-272.4) Assessment: Deteriorated Pt is suppose to be taking Simvastatin but is not taking it because she can not afford this medication. We will test a FLP today and I encouraged her to find  way to get this medication because it is to help prevent heart attack and stroke.   Her updated medication list for this problem includes:    Simvastatin 20 Mg Tabs (Simvastatin) .Marland Kitchen... Take 1 daily  Orders: [facility]- Est Level  3 (62376)  Complete Medication List: 1)  Ecotrin 325 Mg Tbec (Aspirin) .... Take once daily 2)  Amlodipine Besylate 10 Mg Tabs (Amlodipine besylate) .... Take 1 dialy for high blood pressure 3)  Lantus 100 Unit/ml Soln (Insulin glargine) .... Take 35 units at bedtime 4)  Actos 30 Mg Tabs  (Pioglitazone hcl) .Marland Kitchen.. 1 tab a day 5)  Simvastatin 20 Mg Tabs (Simvastatin) .... Take 1 daily 6)  Lisinopril 40 Mg Tabs (Lisinopril) .... Take 1 pill daily 7)  Triamcinolone Acetonide 0.025 % Crea (Triamcinolone acetonide) .... Apply to affected area behind ears but not on face. 15 ounce tube 8)  Hydrochlorothiazide 25 Mg Tabs (Hydrochlorothiazide) .Marland KitchenMarland KitchenMarland Kitchen  Take on pill daily 9)  Metformin Hcl 500 Mg Tabs (Metformin hcl) .... Take one in the morning and one in the evening 10)  Prodigy Lancets 28g Misc (Lancets) .... Use one lancet to test blood sugar 11)  Prodigy No Coding Blood Gluc Strp (Glucose blood) .... Use one test strip to test glucose 12)  Gabapentin 300 Mg Caps (Gabapentin) .... Take one pill 3 times a day  Patient Instructions: 1)  It was nice to see you today.  2)  We are giving you a Lantus pen.  3)  You are getting your Cholesterol tested.  4)  We will work on setting up the mammogram and colonoscopy.  5)  I want to see you again in December.   Laboratory Results   Blood Tests   Date/Time Received: March 23, 2010 10:06 AM  Date/Time Reported: March 23, 2010 10:39 AM   HGBA1C: 13.8%   (Normal Range: Non-Diabetic - 3-6%   Control Diabetic - 6-8%)  Comments: ...............test performed by......Marland KitchenBonnie A. [place]az[place], MLS (ASCP)cm

## 2010-07-13 NOTE — Progress Notes (Signed)
  Phone Note Outgoing Call   Call placed by: Jimmy Footman, CMA,  January 14, 2010 9:07 AM Summary of Call: Called pt. to inform of GI appt. and to ask about mammograms done @ other facilites so a mammorgams can be set up. Got message once agin that answering service cannot process calls at this time.  Follow-up for Phone Call         Patient called not able to leave message Follow-up by: Jimmy Footman, CMA,  January 14, 2010 2:40 PM  Additional Follow-up for Phone Call Additional follow up Details #1::        spoke with pt and informed her of her GI appt. she understands and wrote down appt. Additional Follow-up by: Jimmy Footman, CMA,  January 14, 2010 2:51 PM

## 2010-07-13 NOTE — Letter (Signed)
Summary: Generic Letter  [facility]  [address]   [place], [place] [address]   Phone: [phone]  Fax: [phone]    12/18/2009  Whitney Sloan [address] [place], [place]  [address]  Dear Ms. Mcnulty,   We have made several attempts to reach you by phone.  Dr. Clotilde Dieter would like for you to make an appointment with her to have your needs addressed.  Please call our office at your convenience to schedule an appointment.  Thank you in advance for your time and attention to this matter.          Sincerely,   Loralee Pacas CMA

## 2010-07-13 NOTE — Letter (Signed)
Summary: Generic Letter  [facility]  [address]   [place], [place] [address]   Phone: [phone]  Fax: [phone]    01/29/2010  Whitney Sloan [address] [place], [place]  [address]  Dear [name],   please call and schedule an appointment with Dr. Clotilde Dieter at your earliest convienence.  Thank you for your time and attention to this matter.        Sincerely,   Loralee Pacas CMA

## 2010-07-13 NOTE — Progress Notes (Signed)
Summary: phnmsg  Phone Note Call from Patient Call back at Home Phone [phone]   Caller: Patient Summary of Call: has a question about lantus pen Initial call taken by: De Nurse,  February 10, 2010 11:28 AM  Follow-up for Phone Call        states she has read the papers that came with her pen but does not understand how to put it together & use. offered nurse visit to go over it with her or she can ask her pharmacist. states she has to pick up a rx today & will ask the pharmacist to help her. told her if that did not help, call & we will schedule her to sit with a nurse to go over it Follow-up by: Golden Circle RN,  February 10, 2010 11:31 AM  Additional Follow-up for Phone Call Additional follow up Details #1::        agree. This is a good plan.  Additional Follow-up by: [name] MD,  February 11, 2010 8:51 AM

## 2010-07-13 NOTE — Letter (Signed)
Summary: Generic Letter  [facility]  [address]   [place], [place] [address]   Phone: [phone]  Fax: [phone]    04/09/2010  Whitney Sloan [address] [place], [place]  [address]  Dear Whitney Sloan,  We have been notified by Dr. Cecille Rubin office that you were unable to keep your appt. due to not having your copay at the time. Until you are able to get your colonoscopy, Dr. Clotilde Dieter would like for you to come by the office to pick up some stool cards that you can use at home to assure there is no blood in your stool. I will leave these cards up front with instructions enclosed for you to pick up at your earliest convience.          Sincerely,   Garen Grams LPN for [name] MD  Appended Document: Generic Letter mailed

## 2010-07-13 NOTE — Letter (Signed)
Summary: Discharge Summary  Discharge Summary   Imported By: Clydell Hakim 08/14/2009 14:17:18  _____________________________________________________________________  External Attachment:    Type:   Image     Comment:   External Document

## 2010-07-13 NOTE — Miscellaneous (Signed)
Summary: med record request  Clinical Lists Changes  Rec'd medical record request from Disability determination services faxed 01/01/10 Marily Memos  May 12, 2010 3:19 PM

## 2010-07-13 NOTE — Assessment & Plan Note (Signed)
Summary: DM2, Leg edema, HTN, ? Anal Fissure (no)   Vital Signs:  Patient profile:   55 year old female Height:      63.5 inches Weight:      265.9 pounds BMI:     46.53 Temp:     98.3 degrees F oral Pulse rate:   88 / minute BP sitting:   139 / 84  (left arm) Cuff size:   large  Vitals Entered By: Gladstone Pih (August 25, 2009 4:19 PM) CC: DM, Edema, HTN, ? anal fissure Is Patient Diabetic? Yes Did you bring your meter with you today? Yes Pain Assessment Patient in pain? no        Primary Care Provider:  [name] MD  CC:  DM, Edema, HTN, and ? anal fissure.  History of Present Illness: DM: Pt has DM ,  she is taking Actos and Lantus. She was suppose to be on Metformin as well but the patient did not realize it. Her CBG's have been mostly 140's. She is keeping her log. Her last A1c was 8 on 08/06/09. She mentions that she is developing some tingling in her toes. Some burning. Taught pt how to look at her feet.   Edema: Pt has edema on bilateral lower extremities. She is not taking the HCTZ that was prescribed because she did not know she was to be taking it. It is mostly gone in the mornings. It gets worse after about 1 hour of standing.   hypertension: Pt's BP's are actually do better. She  is taking her prescribed BP meds.  She is not checking her BP at home.   ? Anal Fissure: Pt has some pain occaionally when she stools. She feels a sharp burning pain that comes on all of a sudden. It is not currently present. There is no blood when stooling.   Habits & Providers  Alcohol-Tobacco-Diet     Tobacco Status: never  Current Medications (verified): 1)  Ecotrin 325 Mg Tbec (Aspirin) .... Take Once Daily 2)  Amlodipine Besylate 10 Mg Tabs (Amlodipine Besylate) .... Take 1 Dialy For High Blood Pressure 3)  Lantus 100 Unit/ml Soln (Insulin Glargine) .... Take 30 Units At Bedtime 4)  Actos 30 Mg Tabs (Pioglitazone Hcl) .Marland Kitchen.. 1 Tab A Day 5)  Simvastatin 20 Mg Tabs  (Simvastatin) .... Take 1 Daily 6)  Lisinopril 40 Mg Tabs (Lisinopril) .... Take 1 Pill Daily 7)  Triamcinolone Acetonide 0.025 % Crea (Triamcinolone Acetonide) .... Apply To Affected Area Behind Ears But Not On Face. 15 Ounce Tube 8)  Hydrochlorothiazide 25 Mg Tabs (Hydrochlorothiazide) .... Take On Pill Daily 9)  Metformin Hcl 500 Mg Tabs (Metformin Hcl) .... Take One in The Morning and One in The Evening 10)  Prodigy Lancets 28g  Misc (Lancets) .... Use One Lancet To Test Blood Sugar 11)  Prodigy No Coding Blood Gluc  Strp (Glucose Blood) .... Use One Test Strip To Test Glucose 12)  Gabapentin 300 Mg Caps (Gabapentin) .... Take One Pill 3 Times A Day  Allergies (verified): No Known Drug Allergies  Review of Systems        vitals reviewed and pertinent negatives and positives seen in HPI   Physical Exam  General:  Well-developed,well-nourished,in no acute distress; alert,appropriate and cooperative throughout examination Lungs:  Normal respiratory effort, chest expands symmetrically. Lungs are clear to auscultation, no crackles or wheezes. Heart:  Normal rate and regular rhythm. S1 and S2 normal without gallop, murmur, click, rub or other extra sounds.  Rectal:  No external abnormalities noted. Normal sphincter tone. No rectal masses or tenderness. no fissure seen on anoscopy Extremities:  2 + pitting edema in bilatearl lower legs.  Skin:  some chronic venous stasis changes in bilateral lower legs  Diabetes Management Exam:    Foot Exam (with socks and/or shoes not present):       Sensory-Monofilament:          Left foot: normal          Right foot: normal       Inspection:          Left foot: abnormal             Comments: callus, red bunion developing          Right foot: abnormal             Comments: callus       Nails:          Left foot: too long          Right foot: too long   Impression & Recommendations:  Problem # 1:  DM (ICD-250.00) Assessment Deteriorated Pt  was not taking all the meds she should be taking, her CBG's were mostly in the 140's but were as high as 180's. Put pt back on Metformin. She has run out of ACtos and so I told her to hold off on getting it for right now, see how she does on Metformin and Lantus and she may not need the ACtos. However if CBG's start to elevate she can get Actos filled.   Her updated medication list for this problem includes:    Ecotrin 325 Mg Tbec (Aspirin) .Marland Kitchen... Take once daily    Lantus 100 Unit/ml Soln (Insulin glargine) .Marland Kitchen... Take 30 units at bedtime    Actos 30 Mg Tabs (Pioglitazone hcl) .Marland Kitchen... 1 tab a day    Lisinopril 40 Mg Tabs (Lisinopril) .Marland Kitchen... Take 1 pill daily    Metformin Hcl 500 Mg Tabs (Metformin hcl) .Marland Kitchen... Take one in the morning and one in the evening  Orders: [facility]- Est  Level 4 (01093)  Problem # 2:  LEG EDEMA, BILATERAL (ICD-782.3) Assessment: New Pt has pretty significant lower leg edema but no lung crackles. This is likely from not taking the HCTZ like she is suppose to. Will resume this meds.   Her updated medication list for this problem includes:    Hydrochlorothiazide 25 Mg Tabs (Hydrochlorothiazide) .Marland Kitchen... Take on pill daily  Orders: [facility]- Est  Level 4 (23557)  Problem # 3:  ESSENTIAL HYPERTENSION, BENIGN (ICD-401.1) Assessment: Improved Pt's BP is ok today. Would like to see it even lower but it is better than last time. Cont home meds and add HCTZ  Her updated medication list for this problem includes:    Amlodipine Besylate 10 Mg Tabs (Amlodipine besylate) .Marland Kitchen... Take 1 dialy for high blood pressure    Lisinopril 40 Mg Tabs (Lisinopril) .Marland Kitchen... Take 1 pill daily    Hydrochlorothiazide 25 Mg Tabs (Hydrochlorothiazide) .Marland Kitchen... Take on pill daily  Orders: [facility]- Est  Level 4 ([address]202)  Problem # 4:  ANAL OR RECTAL PAIN (RKY-706.23) Assessment: Unchanged Pt thought she might have an anal fissure but there was not seen on anoscopy. Supportive treatment.   Orders: [facility]- Est  Level 4  ([address]214)  Complete Medication List: 1)  Ecotrin 325 Mg Tbec (Aspirin) .... Take once daily 2)  Amlodipine Besylate 10 Mg Tabs (Amlodipine besylate) .... Take 1 dialy for  high blood pressure 3)  Lantus 100 Unit/ml Soln (Insulin glargine) .... Take 30 units at bedtime 4)  Actos 30 Mg Tabs (Pioglitazone hcl) .Marland Kitchen.. 1 tab a day 5)  Simvastatin 20 Mg Tabs (Simvastatin) .... Take 1 daily 6)  Lisinopril 40 Mg Tabs (Lisinopril) .... Take 1 pill daily 7)  Triamcinolone Acetonide 0.025 % Crea (Triamcinolone acetonide) .... Apply to affected area behind ears but not on face. 15 ounce tube 8)  Hydrochlorothiazide 25 Mg Tabs (Hydrochlorothiazide) .... Take on pill daily 9)  Metformin Hcl 500 Mg Tabs (Metformin hcl) .... Take one in the morning and one in the evening 10)  Prodigy Lancets 28g Misc (Lancets) .... Use one lancet to test blood sugar 11)  Prodigy No Coding Blood Gluc Strp (Glucose blood) .... Use one test strip to test glucose 12)  Gabapentin 300 Mg Caps (Gabapentin) .... Take one pill 3 times a day  Patient Instructions: 1)  Take the Gabapentin morning, noon, and night for your foot numbness and tingling 2)  Keep up the good work with writing down your glucose numbers 3)  Make sure to test every morning and some random times during the week.  4)  I will see you in 2 months for a check up on your Diabetes and to test your A1c. Keep checking your feet, taking your meds (Actos, Metformin and Lantus).  Prescriptions: GABAPENTIN 300 MG CAPS (GABAPENTIN) take one pill 3 times a day  #96 x 11   Entered and Authorized by:   [name] MD   Signed by:   [name] MD on 08/25/2009   Method used:   Electronically to        [facility] [phone]* (retail)       [address]       [place], [place]  [address]       Ph: [phone]       Fax: [phone]   RxID:   [id number] PRODIGY NO CODING BLOOD GLUC  STRP (GLUCOSE BLOOD) use one test strip to test glucose  #50 x 11   Entered and  Authorized by:   [name] MD   Signed by:   [name] MD on 08/25/2009   Method used:   Electronically to        [facility] [phone]* (retail)       [address]       [place], [place]  [address]       Ph: [phone]       Fax: [phone]   RxID:   [id number] PRODIGY LANCETS 28G  MISC (LANCETS) use one lancet to test blood sugar  #100 x 11   Entered and Authorized by:   [name] MD   Signed by:   [name] MD on 08/25/2009   Method used:   Electronically to        [facility] [phone]* (retail)       [address]       [place], [place]  [address]       Ph: [phone]       Fax: [phone]   RxID:   [id number] METFORMIN HCL 500 MG TABS (METFORMIN HCL) take one in the morning and one in the evening  #64 x 11   Entered and Authorized by:   [name] MD   Signed by:   [name] MD on 08/25/2009   Method used:   Electronically to  [facility] [address] [phone]* (retail)       [address]       [place], [place]  [address]       Ph: [phone]       Fax: [phone]   RxID:   [id number] HYDROCHLOROTHIAZIDE 25 MG TABS (HYDROCHLOROTHIAZIDE) take on pill daily  #31 x 11   Entered and Authorized by:   [name] MD   Signed by:   [name] MD on 08/25/2009   Method used:   Electronically to        [facility] [phone]* (retail)       [address]       [place], [place]  [address]       Ph: [phone]       Fax: [phone]   RxID:   [id number] TRIAMCINOLONE ACETONIDE 0.025 % CREA (TRIAMCINOLONE ACETONIDE) apply to affected area behind ears but not on face. 15 ounce tube  #1 x 3   Entered and Authorized by:   [name] MD   Signed by:   [name] MD on 08/25/2009   Method used:   Electronically to        [facility] [phone]* (retail)       [address]       [place], [place]  [address]       Ph: [phone]       Fax: [phone]   RxID:   [id number] LISINOPRIL 40  MG TABS (LISINOPRIL) take 1 pill daily  #31 x 11   Entered and Authorized by:   [name] MD   Signed by:   [name] MD on 08/25/2009   Method used:   Electronically to        [facility] [phone]* (retail)       [address]       [place], [place]  [address]       Ph: [phone]       Fax: [phone]   RxID:   [id number] SIMVASTATIN 20 MG TABS (SIMVASTATIN) take 1 daily  #31 x 11   Entered and Authorized by:   [name] MD   Signed by:   [name] MD on 08/25/2009   Method used:   Electronically to        [facility] [phone]* (retail)       [address]       [place], [place]  [address]       Ph: [phone]       Fax: [phone]   RxID:   [id number] ACTOS 30 MG TABS (PIOGLITAZONE HCL) 1 tab a day  #31 x 11   Entered and Authorized by:   [name] MD   Signed by:   [name] MD on 08/25/2009   Method used:   Electronically to        [facility] [phone]* (retail)       [address]       [place], [place]  [address]       Ph: [phone]       Fax: [phone]   RxID:   [id number] LANTUS 100 UNIT/ML SOLN (INSULIN GLARGINE) take 30 units at bedtime  #31 x 11   Entered and Authorized by:   [name] MD   Signed by:   [name] MD on 08/25/2009   Method used:   Electronically to        [facility] #  [address]* (retail)       [address]       [place], [place]  [address]       Ph: [phone]       Fax: [phone]   RxID:   [id number] AMLODIPINE BESYLATE 10 MG TABS (AMLODIPINE BESYLATE) take 1 dialy for high blood pressure  #31 x 11   Entered and Authorized by:   [name] MD   Signed by:   [name] MD on 08/25/2009   Method used:   Electronically to        [facility] [phone]* (retail)       [address]       [place], [place]  [address]       Ph: [phone]       Fax: [phone]   RxID:   [id number] ECOTRIN 325 MG TBEC (ASPIRIN) take once daily  #31 x  11   Entered and Authorized by:   [name] MD   Signed by:   [name] MD on 08/25/2009   Method used:   Electronically to        [facility] [phone]* (retail)       [address] [place] [address]       [place], [place]  [address]       Ph: [phone]       Fax: [phone]   RxID:   [id number]

## 2010-07-21 NOTE — Progress Notes (Signed)
----   Converted from flag ---- ---- 07/08/2010 12:12 AM, [name] MD wrote: It looks like this patient never got the FOB cards and couldn't get her colonoscopy because she couldn't pay for it. Can you call her and send her a fecal occult blood cards set if she truely didn't get the colonoscopy? Thanks. ------------------------------  Called pt and phone is turned off. Will try later.Jimmy Footman, CMA  July 08, 2010 9:06 AM  attemted to call pt again and phone is either turned off or she is out of the coverage area.Loralee Pacas CMA  July 08, 2010 12:31 PM  could not reach pt and cannot lvm phone is off or out of range.Loralee Pacas CMA  July 09, 2010 10:21 AM  still unsucessful with contacting pt.Marland KitchenMarland KitchenLoralee Pacas CMA  July 09, 2010 5:22 PM  Tried again to reach pt. Phone is stilled turned off.Jimmy Footman, CMA  July 12, 2010 9:25 AM   unable to reach pt by phone will send letter regarding FOB cards.Marland KitchenMarland KitchenLoralee Pacas CMA  July 12, 2010 12:08 PM

## 2010-07-21 NOTE — Letter (Signed)
Summary: Generic Letter  [facility]  [address]   [place], [place] [address]   Phone: [phone]  Fax: [phone]    07/12/2010  Whitney Sloan [address] [place], [place]  [address]  Dear [name],  I have tried multiple time to reach you by phone.  At the request of Dr. Clotilde Dieter she would like for you to stop by our office and pick up the hemmocult stool cards. Also, if you would please at that time update your telephone number.  Thank you in advance for your time and consideration of this request.  Sincerely,   Loralee Pacas CMA

## 2010-08-06 ENCOUNTER — Other Ambulatory Visit: Payer: Self-pay | Admitting: Internal Medicine

## 2010-08-06 DIAGNOSIS — Z1231 Encounter for screening mammogram for malignant neoplasm of breast: Secondary | ICD-10-CM

## 2010-08-19 ENCOUNTER — Ambulatory Visit
Admission: RE | Admit: 2010-08-19 | Discharge: 2010-08-19 | Disposition: A | Discharge status: Home / Self Care | Payer: Medicaid Other | Source: Ambulatory Visit | Attending: Internal Medicine | Admitting: Internal Medicine

## 2010-08-19 DIAGNOSIS — Z1231 Encounter for screening mammogram for malignant neoplasm of breast: Secondary | ICD-10-CM

## 2010-08-23 LAB — URINALYSIS, ROUTINE W REFLEX MICROSCOPIC
Bilirubin Urine: NEGATIVE
Glucose, UA: >1000 mg/dL — AB
Hgb urine dipstick: NEGATIVE
Ketones, ur: 15 mg/dL — AB
Leukocytes, UA: NEGATIVE
Nitrite: NEGATIVE
Protein, ur: NEGATIVE mg/dL
Specific Gravity, Urine: >1.03 — ABNORMAL HIGH (ref 1.005–1.030)
Urobilinogen, UA: 0.2 mg/dL (ref 0.0–1.0)
pH: 5.5 (ref 5.0–8.0)

## 2010-08-23 LAB — URINE MICROSCOPIC-ADD ON

## 2010-08-26 LAB — GLUCOSE, CAPILLARY: Glucose-Capillary: 388 mg/dL — ABNORMAL HIGH (ref 70–99)

## 2010-09-04 IMAGING — CR DG KNEE 1-2V*L*
2 series · 2 of 2 positions shown · non-contrast
Comparison: None

CLINICAL DATA: Knee pain, no injury.

LEFT KNEE - 1-2 VIEW

[t knee ap left]
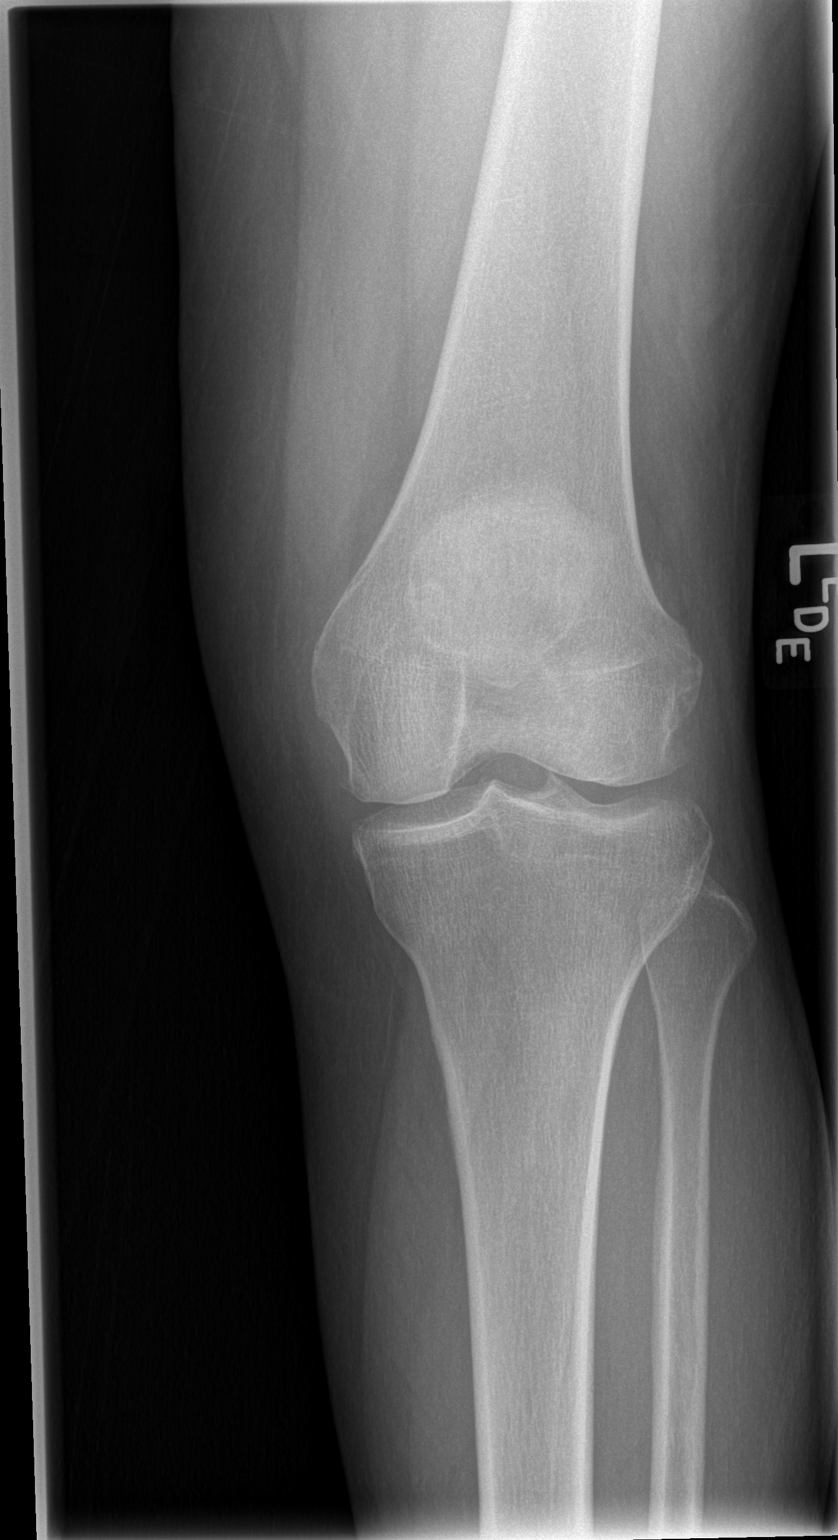

[t knee lat left]
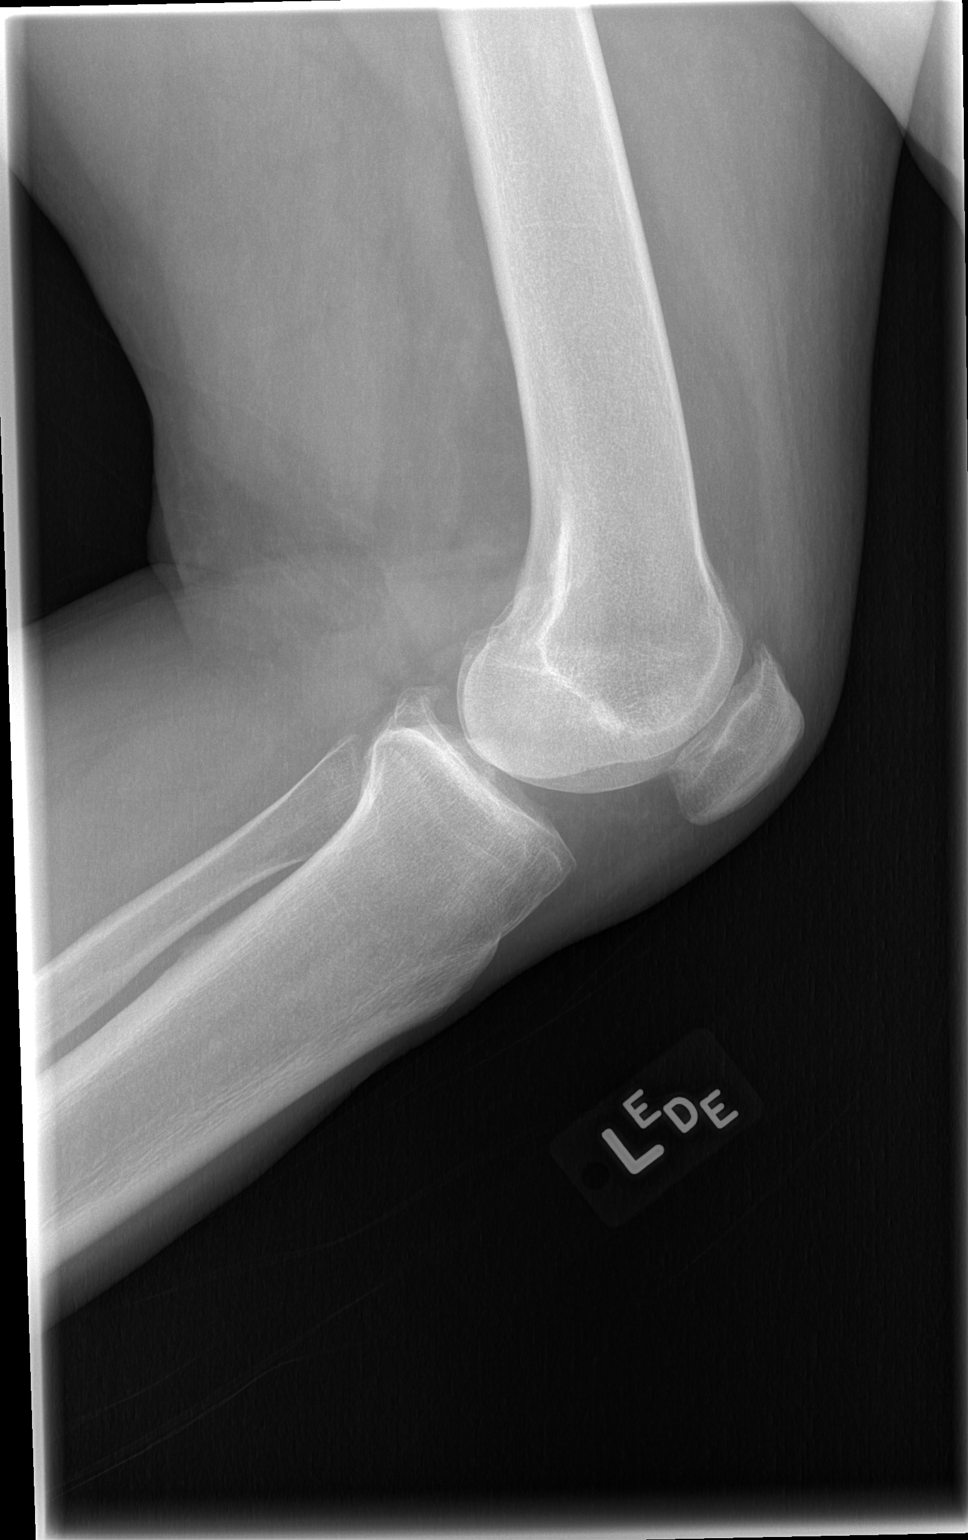

[2 of 2 positions shown; findings below may reference images not displayed]

FINDINGS: Mild degenerative changes present with early joint space
narrowing and osteophyte formation.  No effusions. No acute bony
abnormality.  Specifically, no fracture, subluxation, or
dislocation.  Soft tissues are intact.
IMPRESSION: No acute bony abnormality.  Early degenerative changes.

## 2010-09-16 LAB — URINALYSIS, ROUTINE W REFLEX MICROSCOPIC
Bilirubin Urine: NEGATIVE
Glucose, UA: >1000 mg/dL — AB
Hgb urine dipstick: NEGATIVE
Ketones, ur: NEGATIVE mg/dL
Leukocytes, UA: NEGATIVE
Nitrite: NEGATIVE
Protein, ur: NEGATIVE mg/dL
Specific Gravity, Urine: 1.042 — ABNORMAL HIGH (ref 1.005–1.030)
Urobilinogen, UA: 0.2 mg/dL (ref 0.0–1.0)
pH: 5.5 (ref 5.0–8.0)

## 2010-09-16 LAB — GLUCOSE, CAPILLARY
Glucose-Capillary: 154 mg/dL — ABNORMAL HIGH (ref 70–99)
Glucose-Capillary: 156 mg/dL — ABNORMAL HIGH (ref 70–99)
Glucose-Capillary: 160 mg/dL — ABNORMAL HIGH (ref 70–99)
Glucose-Capillary: 165 mg/dL — ABNORMAL HIGH (ref 70–99)
Glucose-Capillary: 170 mg/dL — ABNORMAL HIGH (ref 70–99)
Glucose-Capillary: 176 mg/dL — ABNORMAL HIGH (ref 70–99)
Glucose-Capillary: 182 mg/dL — ABNORMAL HIGH (ref 70–99)
Glucose-Capillary: 184 mg/dL — ABNORMAL HIGH (ref 70–99)
Glucose-Capillary: 193 mg/dL — ABNORMAL HIGH (ref 70–99)
Glucose-Capillary: 194 mg/dL — ABNORMAL HIGH (ref 70–99)
Glucose-Capillary: 196 mg/dL — ABNORMAL HIGH (ref 70–99)
Glucose-Capillary: 197 mg/dL — ABNORMAL HIGH (ref 70–99)
Glucose-Capillary: 198 mg/dL — ABNORMAL HIGH (ref 70–99)
Glucose-Capillary: 199 mg/dL — ABNORMAL HIGH (ref 70–99)
Glucose-Capillary: 202 mg/dL — ABNORMAL HIGH (ref 70–99)
Glucose-Capillary: 207 mg/dL — ABNORMAL HIGH (ref 70–99)
Glucose-Capillary: 214 mg/dL — ABNORMAL HIGH (ref 70–99)
Glucose-Capillary: 215 mg/dL — ABNORMAL HIGH (ref 70–99)
Glucose-Capillary: 215 mg/dL — ABNORMAL HIGH (ref 70–99)
Glucose-Capillary: 219 mg/dL — ABNORMAL HIGH (ref 70–99)
Glucose-Capillary: 221 mg/dL — ABNORMAL HIGH (ref 70–99)
Glucose-Capillary: 226 mg/dL — ABNORMAL HIGH (ref 70–99)
Glucose-Capillary: 233 mg/dL — ABNORMAL HIGH (ref 70–99)
Glucose-Capillary: 233 mg/dL — ABNORMAL HIGH (ref 70–99)
Glucose-Capillary: 237 mg/dL — ABNORMAL HIGH (ref 70–99)
Glucose-Capillary: 243 mg/dL — ABNORMAL HIGH (ref 70–99)
Glucose-Capillary: 244 mg/dL — ABNORMAL HIGH (ref 70–99)
Glucose-Capillary: 252 mg/dL — ABNORMAL HIGH (ref 70–99)
Glucose-Capillary: 254 mg/dL — ABNORMAL HIGH (ref 70–99)
Glucose-Capillary: 260 mg/dL — ABNORMAL HIGH (ref 70–99)
Glucose-Capillary: 262 mg/dL — ABNORMAL HIGH (ref 70–99)
Glucose-Capillary: 270 mg/dL — ABNORMAL HIGH (ref 70–99)
Glucose-Capillary: 271 mg/dL — ABNORMAL HIGH (ref 70–99)
Glucose-Capillary: 281 mg/dL — ABNORMAL HIGH (ref 70–99)
Glucose-Capillary: 281 mg/dL — ABNORMAL HIGH (ref 70–99)
Glucose-Capillary: 289 mg/dL — ABNORMAL HIGH (ref 70–99)
Glucose-Capillary: 289 mg/dL — ABNORMAL HIGH (ref 70–99)
Glucose-Capillary: 295 mg/dL — ABNORMAL HIGH (ref 70–99)
Glucose-Capillary: 296 mg/dL — ABNORMAL HIGH (ref 70–99)
Glucose-Capillary: 296 mg/dL — ABNORMAL HIGH (ref 70–99)
Glucose-Capillary: 304 mg/dL — ABNORMAL HIGH (ref 70–99)
Glucose-Capillary: 307 mg/dL — ABNORMAL HIGH (ref 70–99)
Glucose-Capillary: 308 mg/dL — ABNORMAL HIGH (ref 70–99)
Glucose-Capillary: 314 mg/dL — ABNORMAL HIGH (ref 70–99)
Glucose-Capillary: 322 mg/dL — ABNORMAL HIGH (ref 70–99)
Glucose-Capillary: 336 mg/dL — ABNORMAL HIGH (ref 70–99)
Glucose-Capillary: 337 mg/dL — ABNORMAL HIGH (ref 70–99)
Glucose-Capillary: 356 mg/dL — ABNORMAL HIGH (ref 70–99)

## 2010-09-16 LAB — CBC
HCT: 37.5 % (ref 36.0–46.0)
HCT: 38.7 % (ref 36.0–46.0)
HCT: 40.8 % (ref 36.0–46.0)
Hemoglobin: 12.5 g/dL (ref 12.0–15.0)
Hemoglobin: 12.9 g/dL (ref 12.0–15.0)
Hemoglobin: 13.6 g/dL (ref 12.0–15.0)
MCHC: 33.2 g/dL (ref 30.0–36.0)
MCHC: 33.3 g/dL (ref 30.0–36.0)
MCHC: 33.3 g/dL (ref 30.0–36.0)
MCV: 86 fL (ref 78.0–100.0)
MCV: 86.6 fL (ref 78.0–100.0)
MCV: 87.1 fL (ref 78.0–100.0)
Platelets: 196 10*3/uL (ref 150–400)
Platelets: 212 10*3/uL (ref 150–400)
Platelets: 228 10*3/uL (ref 150–400)
RBC: 4.33 MIL/uL (ref 3.87–5.11)
RBC: 4.44 MIL/uL (ref 3.87–5.11)
RBC: 4.75 MIL/uL (ref 3.87–5.11)
RDW: 13.9 % (ref 11.5–15.5)
RDW: 13.9 % (ref 11.5–15.5)
RDW: 14 % (ref 11.5–15.5)
WBC: 8.4 10*3/uL (ref 4.0–10.5)
WBC: 8.7 10*3/uL (ref 4.0–10.5)
WBC: 9.4 10*3/uL (ref 4.0–10.5)

## 2010-09-16 LAB — HEMOGLOBIN A1C
Hgb A1c MFr Bld: 12.5 % — ABNORMAL HIGH (ref 4.6–6.1)
Mean Plasma Glucose: 312 mg/dL

## 2010-09-16 LAB — URINE MICROSCOPIC-ADD ON

## 2010-09-16 LAB — BASIC METABOLIC PANEL
BUN: 5 mg/dL — ABNORMAL LOW (ref 6–23)
BUN: 8 mg/dL (ref 6–23)
CO2: 27 mEq/L (ref 19–32)
CO2: 27 mEq/L (ref 19–32)
Calcium: 8.9 mg/dL (ref 8.4–10.5)
Calcium: 9.2 mg/dL (ref 8.4–10.5)
Chloride: 102 mEq/L (ref 96–112)
Chloride: 102 mEq/L (ref 96–112)
Creatinine, Ser: 0.63 mg/dL (ref 0.4–1.2)
Creatinine, Ser: 0.7 mg/dL (ref 0.4–1.2)
GFR calc Af Amer: >60 mL/min (ref >60)
GFR calc Af Amer: >60 mL/min (ref >60)
GFR calc non Af Amer: >60 mL/min (ref >60)
GFR calc non Af Amer: >60 mL/min (ref >60)
Glucose, Bld: 277 mg/dL — ABNORMAL HIGH (ref 70–99)
Glucose, Bld: 347 mg/dL — ABNORMAL HIGH (ref 70–99)
Potassium: 3.1 mEq/L — ABNORMAL LOW (ref 3.5–5.1)
Potassium: 3.4 mEq/L — ABNORMAL LOW (ref 3.5–5.1)
Sodium: 139 mEq/L (ref 135–145)
Sodium: 140 mEq/L (ref 135–145)

## 2010-09-16 LAB — DIFFERENTIAL
Basophils Absolute: 0 10*3/uL (ref 0.0–0.1)
Basophils Absolute: 0 10*3/uL (ref 0.0–0.1)
Basophils Relative: 0 % (ref 0–1)
Basophils Relative: 0 % (ref 0–1)
Eosinophils Absolute: 0.1 10*3/uL (ref 0.0–0.7)
Eosinophils Absolute: 0.1 10*3/uL (ref 0.0–0.7)
Eosinophils Relative: 1 % (ref 0–5)
Eosinophils Relative: 1 % (ref 0–5)
Lymphocytes Relative: 32 % (ref 12–46)
Lymphocytes Relative: 37 % (ref 12–46)
Lymphs Abs: 2.8 10*3/uL (ref 0.7–4.0)
Lymphs Abs: 3.4 10*3/uL (ref 0.7–4.0)
Monocytes Absolute: 0.6 10*3/uL (ref 0.1–1.0)
Monocytes Absolute: 0.7 10*3/uL (ref 0.1–1.0)
Monocytes Relative: 7 % (ref 3–12)
Monocytes Relative: 8 % (ref 3–12)
Neutro Abs: 5.1 10*3/uL (ref 1.7–7.7)
Neutro Abs: 5.2 10*3/uL (ref 1.7–7.7)
Neutrophils Relative %: 55 % (ref 43–77)
Neutrophils Relative %: 59 % (ref 43–77)

## 2010-09-16 LAB — URINE CULTURE
Colony Count: NO GROWTH
Culture: NO GROWTH

## 2010-09-16 LAB — COMPREHENSIVE METABOLIC PANEL
ALT: 16 U/L (ref 0–35)
ALT: 18 U/L (ref 0–35)
AST: 18 U/L (ref 0–37)
AST: 24 U/L (ref 0–37)
Albumin: 3.3 g/dL — ABNORMAL LOW (ref 3.5–5.2)
Albumin: 3.3 g/dL — ABNORMAL LOW (ref 3.5–5.2)
Alkaline Phosphatase: 63 U/L (ref 39–117)
Alkaline Phosphatase: 66 U/L (ref 39–117)
BUN: 7 mg/dL (ref 6–23)
BUN: 8 mg/dL (ref 6–23)
CO2: 27 mEq/L (ref 19–32)
CO2: 28 mEq/L (ref 19–32)
Calcium: 8.4 mg/dL (ref 8.4–10.5)
Calcium: 9.1 mg/dL (ref 8.4–10.5)
Chloride: 100 mEq/L (ref 96–112)
Chloride: 102 mEq/L (ref 96–112)
Creatinine, Ser: 0.64 mg/dL (ref 0.4–1.2)
Creatinine, Ser: 0.76 mg/dL (ref 0.4–1.2)
GFR calc Af Amer: >60 mL/min (ref >60)
GFR calc Af Amer: >60 mL/min (ref >60)
GFR calc non Af Amer: >60 mL/min (ref >60)
GFR calc non Af Amer: >60 mL/min (ref >60)
Glucose, Bld: 241 mg/dL — ABNORMAL HIGH (ref 70–99)
Glucose, Bld: 357 mg/dL — ABNORMAL HIGH (ref 70–99)
Potassium: 2.9 mEq/L — ABNORMAL LOW (ref 3.5–5.1)
Potassium: 3.2 mEq/L — ABNORMAL LOW (ref 3.5–5.1)
Sodium: 137 mEq/L (ref 135–145)
Sodium: 138 mEq/L (ref 135–145)
Total Bilirubin: 0.4 mg/dL (ref 0.3–1.2)
Total Bilirubin: 0.4 mg/dL (ref 0.3–1.2)
Total Protein: 6.6 g/dL (ref 6.0–8.3)
Total Protein: 6.6 g/dL (ref 6.0–8.3)

## 2010-09-16 LAB — LIPID PANEL
Cholesterol: 239 mg/dL — ABNORMAL HIGH (ref 0–200)
HDL: 45 mg/dL (ref >39)
LDL Cholesterol: 149 mg/dL — ABNORMAL HIGH (ref 0–99)
Total CHOL/HDL Ratio: 5.3 RATIO
Triglycerides: 224 mg/dL — ABNORMAL HIGH (ref <150)
VLDL: 45 mg/dL — ABNORMAL HIGH (ref 0–40)

## 2010-09-16 LAB — POCT CARDIAC MARKERS
CKMB, poc: <1 ng/mL — ABNORMAL LOW (ref 1.0–8.0)
CKMB, poc: <1 ng/mL — ABNORMAL LOW (ref 1.0–8.0)
Myoglobin, poc: 188 ng/mL (ref 12–200)
Myoglobin, poc: 65.7 ng/mL (ref 12–200)
Troponin i, poc: <0.05 ng/mL (ref 0.00–0.09)
Troponin i, poc: <0.05 ng/mL (ref 0.00–0.09)

## 2010-09-16 LAB — TSH: TSH: 0.818 u[IU]/mL (ref 0.350–4.500)

## 2010-09-16 LAB — PROTIME-INR
INR: 1.07 (ref 0.00–1.49)
Prothrombin Time: 13.8 seconds (ref 11.6–15.2)

## 2010-09-16 LAB — MAGNESIUM: Magnesium: 1.8 mg/dL (ref 1.5–2.5)

## 2010-09-16 LAB — APTT: aPTT: 22 seconds — ABNORMAL LOW (ref 24–37)

## 2010-09-16 LAB — HOMOCYSTEINE: Homocysteine: 13.1 umol/L (ref 4.0–15.4)

## 2010-09-23 LAB — CBC
HCT: 44.1 % (ref 36.0–46.0)
Hemoglobin: 14.6 g/dL (ref 12.0–15.0)
MCHC: 33 g/dL (ref 30.0–36.0)
MCV: 85.6 fL (ref 78.0–100.0)
Platelets: 209 10*3/uL (ref 150–400)
RBC: 5.16 MIL/uL — ABNORMAL HIGH (ref 3.87–5.11)
RDW: 13.6 % (ref 11.5–15.5)
WBC: 8.8 10*3/uL (ref 4.0–10.5)

## 2010-09-23 LAB — COMPREHENSIVE METABOLIC PANEL
ALT: 25 U/L (ref 0–35)
AST: 34 U/L (ref 0–37)
Albumin: 3.6 g/dL (ref 3.5–5.2)
Alkaline Phosphatase: 87 U/L (ref 39–117)
BUN: 9 mg/dL (ref 6–23)
CO2: 23 mEq/L (ref 19–32)
Calcium: 9.4 mg/dL (ref 8.4–10.5)
Chloride: 100 mEq/L (ref 96–112)
Creatinine, Ser: 0.91 mg/dL (ref 0.4–1.2)
GFR calc Af Amer: >60 mL/min (ref >60)
GFR calc non Af Amer: >60 mL/min (ref >60)
Glucose, Bld: 483 mg/dL — ABNORMAL HIGH (ref 70–99)
Potassium: 3.4 mEq/L — ABNORMAL LOW (ref 3.5–5.1)
Sodium: 136 mEq/L (ref 135–145)
Total Bilirubin: 0.7 mg/dL (ref 0.3–1.2)
Total Protein: 7 g/dL (ref 6.0–8.3)

## 2010-09-23 LAB — DIFFERENTIAL
Basophils Absolute: 0 10*3/uL (ref 0.0–0.1)
Basophils Relative: 1 % (ref 0–1)
Eosinophils Absolute: 0.1 10*3/uL (ref 0.0–0.7)
Eosinophils Relative: 1 % (ref 0–5)
Lymphocytes Relative: 36 % (ref 12–46)
Lymphs Abs: 3.1 10*3/uL (ref 0.7–4.0)
Monocytes Absolute: 0.6 10*3/uL (ref 0.1–1.0)
Monocytes Relative: 7 % (ref 3–12)
Neutro Abs: 4.9 10*3/uL (ref 1.7–7.7)
Neutrophils Relative %: 56 % (ref 43–77)

## 2010-09-23 LAB — URINALYSIS, ROUTINE W REFLEX MICROSCOPIC
Bilirubin Urine: NEGATIVE
Glucose, UA: >1000 mg/dL — AB
Hgb urine dipstick: NEGATIVE
Ketones, ur: 15 mg/dL — AB
Leukocytes, UA: NEGATIVE
Nitrite: NEGATIVE
Protein, ur: NEGATIVE mg/dL
Specific Gravity, Urine: 1.044 — ABNORMAL HIGH (ref 1.005–1.030)
Urobilinogen, UA: 0.2 mg/dL (ref 0.0–1.0)
pH: 5.5 (ref 5.0–8.0)

## 2010-09-23 LAB — GLUCOSE, CAPILLARY
Glucose-Capillary: 234 mg/dL — ABNORMAL HIGH (ref 70–99)
Glucose-Capillary: 326 mg/dL — ABNORMAL HIGH (ref 70–99)
Glucose-Capillary: 350 mg/dL — ABNORMAL HIGH (ref 70–99)
Glucose-Capillary: 435 mg/dL — ABNORMAL HIGH (ref 70–99)

## 2010-09-23 LAB — LIPASE, BLOOD: Lipase: 41 U/L (ref 11–59)

## 2010-09-23 LAB — URINE MICROSCOPIC-ADD ON: Urine-Other: NONE SEEN

## 2010-10-29 NOTE — Op Note (Signed)
NAME:  Whitney, Sloan                         ACCOUNT NO.:  [id number]   MEDICAL RECORD NO.:  [id number]                   PATIENT TYPE:  AMB   LOCATION:  SDC                                  FACILITY:  [facility]   PHYSICIAN:  Zenaida Niece, M.D.             DATE OF BIRTH:  [birth year]   DATE OF PROCEDURE:  12/08/2003  DATE OF DISCHARGE:                                 OPERATIVE REPORT   PREOPERATIVE DIAGNOSES:  1. Abnormal uterine bleeding.  2. Uterine leiomyomas.   POSTOPERATIVE DIAGNOSES:  1. Abnormal bleeding.  2. Uterine leiomyomas.  3. Possible endometrial polyp.   PROCEDURE:  Hysteroscopy with resection of a possible fundal polyp and  NovaSure endometrial ablation.   SURGEON:  Zenaida Niece, M.D.   ANESTHESIA:  General with an LMA and a paracervical block.   ESTIMATED BLOOD LOSS:  Less than 50 mL.   FLUID DEFICIT:  The fluid deficit through the scope was 20 mL.   FINDINGS:  Small fundal lesion consistent with a polyp.  There was a  possible anterior right leiomyoma with a small submucosal component.  The  NovaSure device was used with a uterine depth of 6.5 cm and a width of 4.1  cm and operated at 147 watts for 45 seconds.   DESCRIPTION OF PROCEDURE:  The patient was taken to the operating room and  placed in the dorsal supine position.  General anesthesia was induced and  the patient placed in the mobile stirrups.  Anesthesia did have some issues  with IV access.  Perineum and vagina were prepped and draped in the sterile  fashion and bladder drained with a red rubber catheter.  Graves speculum was  inserted in the vagina and the anterior lip of the cervix grasped with a  single-tooth tenaculum.  Paracervical block was then performed with 2%  lidocaine plain.  Uterus was then sounded to 12.5 cm and cervix measured 6  cm giving a uterine depth of 6.5 cm. Cervix was then dilated to a size 23  dilator to allow insertion of the observer hysteroscope. The observer  hysteroscope was inserted and good visualization achieved.  Most of the  endometrial cavity was atrophic.  There did appear to be this lesion  fundally which appeared to be possibly a small polyp.  There was a small  bulge right anteriorly from a mostly intramural leiomyoma. The observer  hysteroscope was removed and the cervix dilated to a size 31 dilator.  The  resectoscope was then inserted and with a single loop electrode, this fundal  lesion was removed with cutting current.  The lesion was removed with the  hysteroscope.  With the scope, there were no other lesions identified and  again it appears that there is a small submucosal component of this right  anterior leiomyoma.  The hysteroscope was then removed.  The NovaSure device  was set appropriately and inserted without complications.  It did then pass  the CO2 test and endometrial ablation was performed with the measurements  noted above.  This was done without complications.  The NovaSure  device was then removed intact without complications.  The single-tooth  tenaculum was removed from the cervix and bleeding controlled with pressure.  The patient was awakened in the operating room, tolerated the procedure well  and was taken to the recovery room in stable condition.  Counts were correct  and she received no antibiotics.                                               Zenaida Niece, M.D.    TDM/MEDQ  D:  12/08/2003  T:  12/08/2003  Job:  [id number]

## 2010-10-29 NOTE — H&P (Signed)
NAME:  Whitney Sloan, Whitney Sloan                         ACCOUNT NO.:  [id number]   MEDICAL RECORD NO.:  [id number]                   PATIENT TYPE:  AMB   LOCATION:  SDC                                  [facility]ILITY:  [facility]   PHYSICIAN:  Zenaida Niece, M.D.             DATE OF BIRTH:  Dec 07, 1955   DATE OF ADMISSION:  12/08/2003  DATE OF DISCHARGE:                                HISTORY & PHYSICAL   CHIEF COMPLAINT:  Abnormal uterine bleeding.   HISTORY OF PRESENT ILLNESS:  This is a 55 year old black female para 2-0-0-2  whom I first saw for an annual examination on Oct 30, 2003.  At that time  she was complaining of irregular menses which were very heavy soaking  through tampons, pads and clothes with some cramps.  She is not sexually  active.  Physical examination at that time revealed no adnexal masses  although she is obese and her examination was limited due to this.  She had  a sonohysterogram performed on November 27, 2003 and this revealed a large  anteverted uterus with a possible myoma.  Ovaries were not seen.  An  endometrial biopsy was also performed which returned with an endocervical  polyp and proliferative endometrium with focal stromal and glandular  breakdown and findings possible consistent with an endometrial polyp.  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  The  patient has continued to bleed requiring Aygestin to help control the  bleeding and wishes to proceed with conservative surgical therapy.   OBSTETRICAL HISTORY:  Two Cesarean sections at term without complications.   PAST MEDICAL HISTORY:  Hypertension, seborrhea and psoriasis.   PAST SURGICAL HISTORY:  Pilonidal cyst removal, leg surgery, Cesarean  section X2 and tubal ligation.   ALLERGIES:  She has gastrointestinal upset to an unknown PAIN MEDICINE.   CURRENT MEDICATIONS:  Diovan 160 mg daily.   GYN  HISTORY:  No history of abnormal Pap smear and previous Pap smears was  in 1999.  Pap smear performed this May is within normal limits.   SOCIAL HISTORY:  Patient is divorced and denies alcohol, tobacco or drug  use.   FAMILY HISTORY:  Paternal aunt with colon cancer.   PHYSICAL EXAMINATION:  VITAL SIGNS:  Height is 5 feet 5 inches, weight 256.  Blood pressure 154/98. Pulse 70.  GENERAL:  This is a slightly obese black female in no acute distress.  NECK:  Supple without lymphadenopathy or thyromegaly.  LUNGS:  Clear to auscultation.  HEART: Regular rate and rhythm without murmurs.  ABDOMEN:  Obese, soft, nontender, nondistended without palpable masses and  she does have a transverse scar.  EXTREMITIES:  No edema and are nontender.  PELVIC:  External genitalia has no lesions.  On speculum examination the  cervix is normal and the Pap smear was performed.  On bimanual examination  her examination is limited due to her body habitus but there are no masses  palpated and this is confirmed by rectovaginal examination.   ASSESSMENT:  Abnormal uterine bleeding with a benign Pipelle.  Patient may  have a submucosal myoma but by ultrasound this myoma appears to be mostly  intramural.  Patient is on Aygestin to control bleeding and wishes to  proceed with conservative surgical therapy.  The risks of surgery including  bleeding, infection, damage to surrounding organs, including uterine  perforation, have been discussed and she understands.   PLAN:  Admit patient for hysteroscopy with D and C and possible resection of  a polyp or fibroid.  We will also then proceed with NovaSure endometrial  ablation.                                               Zenaida Niece, M.D.    TDM/MEDQ  D:  12/05/2003  T:  12/05/2003  Job:  [id number]

## 2010-11-18 ENCOUNTER — Other Ambulatory Visit: Payer: Self-pay | Admitting: Family Medicine

## 2010-11-18 MED ORDER — AMLODIPINE BESYLATE 10 MG PO TABS: 10.0000 mg | ORAL_TABLET | Freq: Every day | ORAL | Status: DC

## 2010-11-18 MED ORDER — SIMVASTATIN 20 MG PO TABS: 20.0000 mg | ORAL_TABLET | Freq: Every day | ORAL | Status: DC

## 2011-02-17 ENCOUNTER — Emergency Department (HOSPITAL_COMMUNITY)
Admission: EM | Admit: 2011-02-17 | Discharge: 2011-02-17 | Disposition: A | Discharge status: Home / Self Care | Payer: Medicaid Other | Attending: Emergency Medicine | Admitting: Emergency Medicine

## 2011-02-17 ENCOUNTER — Emergency Department (HOSPITAL_COMMUNITY): Payer: Medicaid Other

## 2011-02-17 DIAGNOSIS — M25519 Pain in unspecified shoulder: Secondary | ICD-10-CM | POA: Insufficient documentation

## 2011-02-17 DIAGNOSIS — Z8673 Personal history of transient ischemic attack (TIA), and cerebral infarction without residual deficits: Secondary | ICD-10-CM | POA: Insufficient documentation

## 2011-02-17 DIAGNOSIS — Z79899 Other long term (current) drug therapy: Secondary | ICD-10-CM | POA: Insufficient documentation

## 2011-02-17 DIAGNOSIS — I10 Essential (primary) hypertension: Secondary | ICD-10-CM | POA: Insufficient documentation

## 2011-02-17 DIAGNOSIS — E119 Type 2 diabetes mellitus without complications: Secondary | ICD-10-CM | POA: Insufficient documentation

## 2011-02-17 DIAGNOSIS — W010XXA Fall on same level from slipping, tripping and stumbling without subsequent striking against object, initial encounter: Secondary | ICD-10-CM | POA: Insufficient documentation

## 2011-03-21 ENCOUNTER — Other Ambulatory Visit: Payer: Self-pay | Admitting: Family Medicine

## 2011-03-21 NOTE — Telephone Encounter (Signed)
Refill request

## 2011-03-31 LAB — DIFFERENTIAL
Basophils Absolute: 0
Basophils Relative: 0
Eosinophils Absolute: 0.1
Eosinophils Relative: 1
Lymphocytes Relative: 17
Lymphs Abs: 2.1
Monocytes Absolute: 0.9 — ABNORMAL HIGH
Monocytes Relative: 8
Neutro Abs: 9.4 — ABNORMAL HIGH
Neutrophils Relative %: 74

## 2011-03-31 LAB — I-STAT 8, (EC8 V) (CONVERTED LAB)
Acid-Base Excess: 1
BUN: 9
Bicarbonate: 26.1 — ABNORMAL HIGH
Chloride: 107
Glucose, Bld: 171 — ABNORMAL HIGH
HCT: 44
Hemoglobin: 15
Operator id: 270111
Potassium: 3.6
Sodium: 140
TCO2: 27
pCO2, Ven: 40.8 — ABNORMAL LOW
pH, Ven: 7.413 — ABNORMAL HIGH

## 2011-03-31 LAB — CBC
HCT: 39.8
Hemoglobin: 13
MCHC: 32.6
MCV: 83.9
Platelets: 327
RBC: 4.74
RDW: 14.4 — ABNORMAL HIGH
WBC: 12.7 — ABNORMAL HIGH

## 2011-03-31 LAB — POCT I-STAT CREATININE
Creatinine, Ser: 0.7
Operator id: 270111

## 2011-03-31 LAB — SEDIMENTATION RATE: Sed Rate: 54 — ABNORMAL HIGH

## 2011-06-08 ENCOUNTER — Other Ambulatory Visit: Payer: Self-pay | Admitting: Family Medicine

## 2011-06-08 MED ORDER — SIMVASTATIN 20 MG PO TABS: 20.0000 mg | ORAL_TABLET | Freq: Every day | ORAL | Status: DC

## 2011-08-31 ENCOUNTER — Other Ambulatory Visit: Payer: Self-pay | Admitting: Family Medicine

## 2011-08-31 MED ORDER — METFORMIN HCL 1000 MG PO TABS: 1000.0000 mg | ORAL_TABLET | Freq: Two times a day (BID) | ORAL | Status: DC

## 2011-09-13 ENCOUNTER — Other Ambulatory Visit: Payer: Self-pay | Admitting: Internal Medicine

## 2011-09-13 DIAGNOSIS — Z1231 Encounter for screening mammogram for malignant neoplasm of breast: Secondary | ICD-10-CM

## 2011-09-19 ENCOUNTER — Ambulatory Visit: Payer: Medicaid Other

## 2011-09-20 ENCOUNTER — Encounter (HOSPITAL_COMMUNITY): Payer: Self-pay | Admitting: *Deleted

## 2011-09-20 ENCOUNTER — Emergency Department (HOSPITAL_COMMUNITY)
Admission: EM | Admit: 2011-09-20 | Discharge: 2011-09-21 | Disposition: A | Discharge status: Home / Self Care | Payer: Medicaid Other | Attending: Emergency Medicine | Admitting: Emergency Medicine

## 2011-09-20 ENCOUNTER — Encounter (HOSPITAL_COMMUNITY): Payer: Self-pay | Admitting: Emergency Medicine

## 2011-09-20 ENCOUNTER — Emergency Department (HOSPITAL_COMMUNITY)
Admission: EM | Admit: 2011-09-20 | Discharge: 2011-09-20 | Discharge status: Against Medical Advice | Payer: Medicaid Other | Source: Home / Self Care

## 2011-09-20 DIAGNOSIS — Z79899 Other long term (current) drug therapy: Secondary | ICD-10-CM | POA: Insufficient documentation

## 2011-09-20 DIAGNOSIS — L0390 Cellulitis, unspecified: Secondary | ICD-10-CM

## 2011-09-20 DIAGNOSIS — I10 Essential (primary) hypertension: Secondary | ICD-10-CM | POA: Insufficient documentation

## 2011-09-20 DIAGNOSIS — M7989 Other specified soft tissue disorders: Secondary | ICD-10-CM | POA: Insufficient documentation

## 2011-09-20 DIAGNOSIS — E785 Hyperlipidemia, unspecified: Secondary | ICD-10-CM | POA: Insufficient documentation

## 2011-09-20 DIAGNOSIS — R609 Edema, unspecified: Secondary | ICD-10-CM | POA: Insufficient documentation

## 2011-09-20 DIAGNOSIS — E119 Type 2 diabetes mellitus without complications: Secondary | ICD-10-CM | POA: Insufficient documentation

## 2011-09-20 HISTORY — DX: Psoriasis, unspecified: L40.9

## 2011-09-20 HISTORY — DX: Hyperlipidemia, unspecified: E78.5

## 2011-09-20 HISTORY — DX: Essential (primary) hypertension: I10

## 2011-09-20 LAB — DIFFERENTIAL
Basophils Absolute: 0 10*3/uL (ref 0.0–0.1)
Basophils Relative: 0 % (ref 0–1)
Eosinophils Absolute: 0.2 10*3/uL (ref 0.0–0.7)
Eosinophils Relative: 2 % (ref 0–5)
Lymphocytes Relative: 33 % (ref 12–46)
Lymphs Abs: 3 10*3/uL (ref 0.7–4.0)
Monocytes Absolute: 0.8 10*3/uL (ref 0.1–1.0)
Monocytes Relative: 9 % (ref 3–12)
Neutro Abs: 5.1 10*3/uL (ref 1.7–7.7)
Neutrophils Relative %: 56 % (ref 43–77)

## 2011-09-20 LAB — CBC
HCT: 38.7 % (ref 36.0–46.0)
Hemoglobin: 12.7 g/dL (ref 12.0–15.0)
MCH: 27.3 pg (ref 26.0–34.0)
MCHC: 32.8 g/dL (ref 30.0–36.0)
MCV: 83.2 fL (ref 78.0–100.0)
Platelets: 310 10*3/uL (ref 150–400)
RBC: 4.65 MIL/uL (ref 3.87–5.11)
RDW: 15 % (ref 11.5–15.5)
WBC: 9 10*3/uL (ref 4.0–10.5)

## 2011-09-20 LAB — BASIC METABOLIC PANEL
BUN: 11 mg/dL (ref 6–23)
CO2: 28 mEq/L (ref 19–32)
Calcium: 10.2 mg/dL (ref 8.4–10.5)
Chloride: 97 mEq/L (ref 96–112)
Creatinine, Ser: 0.73 mg/dL (ref 0.50–1.10)
GFR calc Af Amer: >90 mL/min (ref >90)
GFR calc non Af Amer: >90 mL/min (ref >90)
Glucose, Bld: 372 mg/dL — ABNORMAL HIGH (ref 70–99)
Potassium: 3.6 mEq/L (ref 3.5–5.1)
Sodium: 139 mEq/L (ref 135–145)

## 2011-09-20 LAB — GLUCOSE, CAPILLARY: Glucose-Capillary: 267 mg/dL — ABNORMAL HIGH (ref 70–99)

## 2011-09-20 NOTE — ED Notes (Signed)
PT. REPORTS BILATERAL ANKLE SWELLING / REDDNESS FOR SEVERAL DAYS , STATES SHE MISSED HER INSULIN AT NIGHT FOR 2 WEEKS.

## 2011-09-20 NOTE — ED Notes (Signed)
Patient has hx of diabetes.  She has noted onset of tightness in her ankles.  She also reports the ankles are red.  Her swelling increases during the day and decreases at night.  She had hx of cellulitis

## 2011-09-20 NOTE — ED Notes (Signed)
Pt states that she needs to leave. Risks explained regarding leaving AMA. Pt left after being triaged.

## 2011-09-21 ENCOUNTER — Emergency Department (HOSPITAL_COMMUNITY): Payer: Medicaid Other

## 2011-09-21 LAB — URINALYSIS, ROUTINE W REFLEX MICROSCOPIC
Bilirubin Urine: NEGATIVE
Glucose, UA: >1000 mg/dL — AB
Hgb urine dipstick: NEGATIVE
Ketones, ur: NEGATIVE mg/dL
Nitrite: NEGATIVE
Protein, ur: NEGATIVE mg/dL
Specific Gravity, Urine: 1.039 — ABNORMAL HIGH (ref 1.005–1.030)
Urobilinogen, UA: 0.2 mg/dL (ref 0.0–1.0)
pH: 5 (ref 5.0–8.0)

## 2011-09-21 LAB — D-DIMER, QUANTITATIVE: D-Dimer, Quant: 0.39 ug/mL-FEU (ref 0.00–0.48)

## 2011-09-21 LAB — GLUCOSE, CAPILLARY: Glucose-Capillary: 354 mg/dL — ABNORMAL HIGH (ref 70–99)

## 2011-09-21 LAB — URINE MICROSCOPIC-ADD ON

## 2011-09-21 MED ORDER — INSULIN ASPART 100 UNIT/ML ~~LOC~~ SOLN
5.0000 [IU] | Freq: Once | SUBCUTANEOUS | Status: DC
  Filled 2011-09-21: qty 1

## 2011-09-21 MED ORDER — SULFAMETHOXAZOLE-TRIMETHOPRIM 800-160 MG PO TABS: 1.0000 | ORAL_TABLET | Freq: Two times a day (BID) | ORAL | Status: AC

## 2011-09-21 MED ORDER — INSULIN ASPART 100 UNIT/ML ~~LOC~~ SOLN
SUBCUTANEOUS | Status: AC
  Filled 2011-09-21: qty 1

## 2011-09-21 MED ORDER — INSULIN REGULAR HUMAN 100 UNIT/ML IJ SOLN
5.0000 [IU] | Freq: Once | INTRAMUSCULAR | Status: DC
  Filled 2011-09-21 (×2): qty 0.05

## 2011-09-21 MED ORDER — INSULIN ASPART 100 UNIT/ML ~~LOC~~ SOLN
5.0000 [IU] | Freq: Once | SUBCUTANEOUS | Status: AC
  Administered 2011-09-21: 5 [IU] via SUBCUTANEOUS

## 2011-09-21 NOTE — ED Notes (Addendum)
Pt stated that she has been having redness, swelling, and pain in both ankles since last Thursday. Currently, pain is 5 out of 10. There is tightness and swelling in ankles. Redness to the back of both ankles. More redness in the left ankle. Pedal pulses present. Capillary refill present and brisk. Ankles are warm to touch. Pt has chronic red spots on both legs. They are intermittent. Pt also stated that she has not had insulin x 2 weeks. No fevers. Will continue to monitor.

## 2011-09-21 NOTE — Discharge Instructions (Signed)
Cellulitis Cellulitis is an infection of the tissue under the skin. The infected area is usually red and tender. This is caused by germs. These germs enter the body through cuts or sores. This usually happens in the arms or lower legs. HOME CARE   Take your medicine as told. Finish it even if you start to feel better.   If the infection is on the arm or leg, keep it raised (elevated).   Use a warm cloth on the infected area several times a day.   See your doctor for a follow-up visit as told.  GET HELP RIGHT AWAY IF:   You are tired or confused.   You throw up (vomit).   You have watery poop (diarrhea).   You feel ill and have muscle aches.   You have a fever.  MAKE SURE YOU:   Understand these instructions.   Will watch your condition.   Will get help right away if you are not doing well or get worse.  Document Released: 11/16/2007 Document Revised: 05/19/2011 Document Reviewed: 05/01/2009 [employer] Patient Information 2012 [employer]. 

## 2011-09-21 NOTE — ED Provider Notes (Signed)
History     CSN: [id number]  Arrival date & time 09/20/11  2114   First MD Initiated Contact with Patient 09/21/11 0049      Chief Complaint  Patient presents with  . Joint Swelling    (Consider location/radiation/quality/duration/timing/severity/associated sxs/prior treatment) The history is provided by the patient. No language interpreter was used.  patient presents with weeks of B symmetrical lower extremity swelling that is worse during the day and gets better when she rests.  Now has redness of B ankles.  No f/c/r.  No CP, no DOE, no SOB, no n/v/d.  No weakness or numbness.  Has been seen by PCP for same  Past Medical History  Diagnosis Date  . Diabetes mellitus   . Hypertension   . Hyperlipemia   . Psoriasis     Past Surgical History  Procedure Date  . Anal fissure repair   . Endometrial ablation     No family history on file.  History  Substance Use Topics  . Smoking status: Never Smoker   . Smokeless tobacco: Not on file  . Alcohol Use: No    OB History    Grav Para Term Preterm Abortions TAB SAB Ect Mult Living                  Review of Systems  Constitutional: Negative.   HENT: Negative.   Eyes: Negative.   Respiratory: Negative.  Negative for shortness of breath.   Cardiovascular: Positive for leg swelling. Negative for chest pain.  Gastrointestinal: Negative.   Genitourinary: Negative.   Musculoskeletal: Negative.   Skin: Positive for rash.  Neurological: Negative.   Hematological: Negative.   Psychiatric/Behavioral: Negative.     Allergies  Oxycodone-acetaminophen  Home Medications   Current Outpatient Rx  Name Route Sig Dispense Refill  . AMLODIPINE BESYLATE 10 MG PO TABS Oral Take 10 mg by mouth daily. For high blood pressure    . ASPIRIN 81 MG PO TBEC Oral Take 81 mg by mouth every morning.     . ASPIRIN 325 MG PO TBEC Oral Take 325 mg by mouth every morning.     Marland Kitchen VITAMIN D PO Oral Take 1 capsule by mouth daily.    Marland Kitchen  HYDROCHLOROTHIAZIDE 25 MG PO TABS Oral Take 25 mg by mouth daily.     . INSULIN GLARGINE 100 UNIT/ML [place] SOLN Subcutaneous Inject 60 Units into the skin at bedtime.     Marland Kitchen LISINOPRIL 40 MG PO TABS Oral Take 40 mg by mouth daily.     Marland Kitchen METFORMIN HCL 1000 MG PO TABS Oral Take 1,000 mg by mouth 2 (two) times daily.    Marland Kitchen PIOGLITAZONE HCL 30 MG PO TABS Oral Take 30 mg by mouth daily.     Marland Kitchen SIMVASTATIN 20 MG PO TABS Oral Take 20 mg by mouth at bedtime.     Marland Kitchen UNIFINE PENTIPS 31G X 6 MM MISC  USE TO INJECT INSULIN 100 each PRN    BP 110/67  Pulse 119  Temp(Src) 98.4 F (36.9 C) (Oral)  Resp 19  SpO2 96%  Physical Exam  Constitutional: She is oriented to person, place, and time. She appears well-developed and well-nourished. No distress.  HENT:  Head: Normocephalic and atraumatic.  Mouth/Throat: Oropharynx is clear and moist.  Eyes: Conjunctivae are normal. Pupils are equal, round, and reactive to light.  Neck: Normal range of motion. Neck supple.  Cardiovascular: Normal rate and regular rhythm.   Pulmonary/Chest: Effort normal and breath sounds  normal. She has no wheezes. She has no rales.  Abdominal: Soft. Bowel sounds are normal. There is no tenderness. There is no rebound and no guarding.  Musculoskeletal: She exhibits edema. She exhibits no tenderness.       Mild cellulitis of medial aspects of B ankles  Neurological: She is alert and oriented to person, place, and time.  Skin: Skin is warm and dry.  Psychiatric: She has a normal mood and affect.    ED Course  Procedures (including critical care time)  Labs Reviewed  BASIC METABOLIC PANEL - Abnormal; Notable for the following:    Glucose, Bld 372 (*)    All other components within normal limits  CBC  DIFFERENTIAL  URINALYSIS, ROUTINE W REFLEX MICROSCOPIC  URINALYSIS, ROUTINE W REFLEX MICROSCOPIC   No results found.   No diagnosis found.    MDM  Follow up with your regular doctor return for fevers chills worsening  symptoms       Alleah Dearman K Jahki Witham-Rasch, MD 09/21/11 [phone]

## 2011-09-21 NOTE — ED Notes (Signed)
The override insulin was pulled on the pts first admission today. Wasted the insulin in pyxis. However pt chart closed and read only.

## 2011-09-30 ENCOUNTER — Ambulatory Visit (HOSPITAL_COMMUNITY): Admission: RE | Admit: 2011-09-30 | Payer: Medicaid Other | Source: Ambulatory Visit

## 2011-10-24 ENCOUNTER — Ambulatory Visit: Payer: Medicaid Other

## 2012-04-02 ENCOUNTER — Other Ambulatory Visit: Payer: Self-pay | Admitting: *Deleted

## 2012-04-02 MED ORDER — LISINOPRIL 40 MG PO TABS: 40.0000 mg | ORAL_TABLET | Freq: Every day | ORAL | Status: DC

## 2012-04-02 MED ORDER — PIOGLITAZONE HCL 30 MG PO TABS: 30.0000 mg | ORAL_TABLET | Freq: Every day | ORAL | Status: DC

## 2012-05-30 ENCOUNTER — Other Ambulatory Visit: Payer: Self-pay | Admitting: *Deleted

## 2012-05-30 MED ORDER — METFORMIN HCL 1000 MG PO TABS: 1000.0000 mg | ORAL_TABLET | Freq: Two times a day (BID) | ORAL | Status: DC

## 2012-05-30 MED ORDER — SIMVASTATIN 20 MG PO TABS: 20.0000 mg | ORAL_TABLET | Freq: Every day | ORAL | Status: DC

## 2012-05-30 MED ORDER — AMLODIPINE BESYLATE 10 MG PO TABS: 10.0000 mg | ORAL_TABLET | Freq: Every day | ORAL | Status: DC

## 2013-01-03 ENCOUNTER — Telehealth: Payer: Self-pay | Admitting: *Deleted

## 2013-01-03 NOTE — Telephone Encounter (Signed)
Pt called and no voice mail left  regarding scheduling of yearly diabetic check and A1C , letter also sent to pt home. Wyatt Haste, RN-BSN

## 2013-02-12 ENCOUNTER — Ambulatory Visit: Payer: Medicaid Other | Admitting: Sports Medicine

## 2013-03-12 ENCOUNTER — Other Ambulatory Visit: Payer: Self-pay | Admitting: Sports Medicine

## 2013-05-06 ENCOUNTER — Other Ambulatory Visit: Payer: Self-pay | Admitting: Internal Medicine

## 2013-05-06 ENCOUNTER — Other Ambulatory Visit: Payer: Self-pay | Admitting: Sports Medicine

## 2013-05-27 ENCOUNTER — Other Ambulatory Visit: Payer: Self-pay | Admitting: Internal Medicine

## 2013-06-03 ENCOUNTER — Other Ambulatory Visit: Payer: Self-pay | Admitting: Internal Medicine

## 2013-06-03 ENCOUNTER — Other Ambulatory Visit: Payer: Self-pay | Admitting: Sports Medicine

## 2013-10-22 ENCOUNTER — Other Ambulatory Visit: Payer: Self-pay

## 2013-10-22 DIAGNOSIS — Z1231 Encounter for screening mammogram for malignant neoplasm of breast: Secondary | ICD-10-CM

## 2013-11-05 ENCOUNTER — Ambulatory Visit: Payer: Medicaid Other

## 2013-11-14 ENCOUNTER — Ambulatory Visit
Admission: RE | Admit: 2013-11-14 | Discharge: 2013-11-14 | Disposition: A | Discharge status: Home / Self Care | Payer: Medicaid Other | Source: Ambulatory Visit

## 2013-11-14 ENCOUNTER — Encounter (INDEPENDENT_AMBULATORY_CARE_PROVIDER_SITE_OTHER): Payer: Self-pay

## 2013-11-14 DIAGNOSIS — Z1231 Encounter for screening mammogram for malignant neoplasm of breast: Secondary | ICD-10-CM

## 2016-05-04 ENCOUNTER — Other Ambulatory Visit: Payer: Self-pay | Admitting: Internal Medicine

## 2016-05-04 DIAGNOSIS — E2839 Other primary ovarian failure: Secondary | ICD-10-CM

## 2016-05-19 ENCOUNTER — Other Ambulatory Visit: Payer: Self-pay | Admitting: Internal Medicine

## 2016-05-19 ENCOUNTER — Other Ambulatory Visit: Payer: Medicaid Other

## 2016-05-19 DIAGNOSIS — Z1231 Encounter for screening mammogram for malignant neoplasm of breast: Secondary | ICD-10-CM

## 2016-06-14 ENCOUNTER — Other Ambulatory Visit: Payer: Medicaid Other

## 2016-06-14 ENCOUNTER — Ambulatory Visit: Payer: Medicaid Other

## 2016-06-29 ENCOUNTER — Ambulatory Visit: Payer: Medicaid Other

## 2016-06-29 ENCOUNTER — Other Ambulatory Visit: Payer: Medicaid Other

## 2016-07-18 ENCOUNTER — Ambulatory Visit: Payer: Medicaid Other

## 2016-07-18 ENCOUNTER — Other Ambulatory Visit: Payer: Medicaid Other

## 2016-07-22 ENCOUNTER — Encounter (HOSPITAL_COMMUNITY): Payer: Self-pay | Admitting: Emergency Medicine

## 2016-07-22 ENCOUNTER — Ambulatory Visit (HOSPITAL_COMMUNITY)
Admission: EM | Admit: 2016-07-22 | Discharge: 2016-07-22 | Disposition: A | Discharge status: Home / Self Care | Payer: Medicaid Other | Attending: Family Medicine | Admitting: Family Medicine

## 2016-07-22 DIAGNOSIS — R21 Rash and other nonspecific skin eruption: Secondary | ICD-10-CM | POA: Diagnosis not present

## 2016-07-22 MED ORDER — FLUCONAZOLE 150 MG PO TABS: 150.0000 mg | ORAL_TABLET | Freq: Every day | ORAL | 0 refills | Status: DC

## 2016-07-22 MED ORDER — NYSTATIN 100000 UNIT/GM EX POWD: Freq: Four times a day (QID) | CUTANEOUS | 1 refills | Status: DC

## 2016-07-22 MED ORDER — NYSTATIN 100000 UNIT/GM EX CREA: TOPICAL_CREAM | CUTANEOUS | 1 refills | Status: DC

## 2016-07-22 NOTE — ED Provider Notes (Signed)
CSN: [id number]     Arrival date & time 07/22/16  1759 History   First MD Initiated Contact with Patient 07/22/16 1856     Chief Complaint  Patient presents with  . Rash   (Consider location/radiation/quality/duration/timing/severity/associated sxs/prior Treatment) Patient c/o rash under breasts and worst left breast.   The history is provided by the patient.  Rash  Location: Breast rash. Quality: redness   Onset quality:  Sudden Timing:  Constant Progression:  Worsening Chronicity:  New Relieved by:  Nothing Worsened by:  Nothing Associated symptoms: fatigue     Past Medical History:  Diagnosis Date  . Diabetes mellitus   . Hyperlipemia   . Hypertension   . Psoriasis    Past Surgical History:  Procedure Laterality Date  . ANAL FISSURE REPAIR    . ENDOMETRIAL ABLATION     History reviewed. No pertinent family history. Social History  Substance Use Topics  . Smoking status: Never Smoker  . Smokeless tobacco: Never Used  . Alcohol use No   OB History    No data available     Review of Systems  Constitutional: Positive for fatigue.  HENT: Negative.   Eyes: Negative.   Respiratory: Negative.   Cardiovascular: Negative.   Gastrointestinal: Negative.   Endocrine: Negative.   Genitourinary: Negative.   Musculoskeletal: Negative.   Skin: Positive for rash.  Allergic/Immunologic: Negative.   Neurological: Negative.   Hematological: Negative.   Psychiatric/Behavioral: Negative.     Allergies  Oxycodone-acetaminophen  Home Medications   Prior to Admission medications   Medication Sig Start Date End Date Taking? Authorizing Provider  amLODipine (NORVASC) 10 MG tablet Take 1 tablet (10 mg total) by mouth daily. For high blood pressure 05/30/12  Yes Andrena Mews, DO  aspirin (ASPIR-LOW) 81 MG EC tablet Take 81 mg by mouth every morning.    Yes Historical Provider, MD  Cholecalciferol (VITAMIN D PO) Take 1 capsule by mouth daily.   Yes Historical Provider,  MD  hydrochlorothiazide (HYDRODIURIL) 25 MG tablet Take 25 mg by mouth daily.  03/21/11  Yes Edd Arbour, MD  insulin glargine (LANTUS) 100 UNIT/ML injection Inject 60 Units into the skin at bedtime.    Yes Historical Provider, MD  lisinopril (PRINIVIL,ZESTRIL) 40 MG tablet Take 1 tablet (40 mg total) by mouth daily. 04/02/12  Yes Andrena Mews, DO  metFORMIN (GLUCOPHAGE) 1000 MG tablet Take 1 tablet (1,000 mg total) by mouth 2 (two) times daily. 05/30/12  Yes Andrena Mews, DO  pioglitazone (ACTOS) 30 MG tablet Take 1 tablet (30 mg total) by mouth daily. 04/02/12  Yes Andrena Mews, DO  simvastatin (ZOCOR) 20 MG tablet Take 1 tablet (20 mg total) by mouth at bedtime. 05/30/12  Yes Andrena Mews, DO  UNIFINE PENTIPS 31G X 6 MM MISC USE TO INJECT INSULIN 03/21/11  Yes Edd Arbour, MD  aspirin (ECOTRIN) 325 MG EC tablet Take 325 mg by mouth every morning.     Historical Provider, MD  fluconazole (DIFLUCAN) 150 MG tablet Take 1 tablet (150 mg total) by mouth daily. 07/22/16   Deatra Canter, FNP  nystatin (MYCOSTATIN/NYSTOP) powder Apply topically 4 (four) times daily. 07/22/16   Deatra Canter, FNP  nystatin cream (MYCOSTATIN) Apply to affected area 2 times daily 07/22/16   Deatra Canter, FNP   Meds Ordered and Administered this Visit  Medications - No data to display  BP 94/57 (BP Location: Right Arm)   Pulse 107   Temp 98.3  F (36.8 C) (Oral)   SpO2 97%  No data found.   Physical Exam  Constitutional: She appears well-developed and well-nourished.  HENT:  Head: Normocephalic and atraumatic.  Right Ear: External ear normal.  Left Ear: External ear normal.  Mouth/Throat: Oropharynx is clear and moist.  Eyes: Conjunctivae and EOM are normal. Pupils are equal, round, and reactive to light.  Neck: Normal range of motion. Neck supple.  Cardiovascular: Normal rate, regular rhythm and normal heart sounds.   Pulmonary/Chest: Effort normal and breath sounds normal.  Abdominal:  Soft. Bowel sounds are normal.  Skin: Rash noted.  Erythematous rash under left breast.  Nursing note and vitals reviewed.   Urgent Care Course     Procedures (including critical care time)  Labs Review Labs Reviewed - No data to display  Imaging Review No results found.   Visual Acuity Review  Right Eye Distance:   Left Eye Distance:   Bilateral Distance:    Right Eye Near:   Left Eye Near:    Bilateral Near:         MDM   1. Rash    Diflucan 150mg  one po qd x 5 days #5 Nystatin Powder Nystatin Cream apply bid      Deatra Canter, FNP 07/22/16 1940

## 2016-07-22 NOTE — ED Triage Notes (Addendum)
Pt reports a rash on the underside of her left breast for 10 days.  She reports itching, discomfort and oozing.  She denies any fever.

## 2016-10-01 ENCOUNTER — Ambulatory Visit (HOSPITAL_COMMUNITY)
Admission: EM | Admit: 2016-10-01 | Discharge: 2016-10-01 | Disposition: A | Discharge status: Home / Self Care | Payer: Medicaid Other | Attending: Family Medicine | Admitting: Family Medicine

## 2016-10-01 ENCOUNTER — Encounter (HOSPITAL_COMMUNITY): Payer: Self-pay | Admitting: Emergency Medicine

## 2016-10-01 DIAGNOSIS — Z7982 Long term (current) use of aspirin: Secondary | ICD-10-CM | POA: Diagnosis not present

## 2016-10-01 DIAGNOSIS — R350 Frequency of micturition: Secondary | ICD-10-CM | POA: Insufficient documentation

## 2016-10-01 DIAGNOSIS — R300 Dysuria: Secondary | ICD-10-CM | POA: Insufficient documentation

## 2016-10-01 DIAGNOSIS — Z9889 Other specified postprocedural states: Secondary | ICD-10-CM | POA: Diagnosis not present

## 2016-10-01 DIAGNOSIS — N898 Other specified noninflammatory disorders of vagina: Secondary | ICD-10-CM

## 2016-10-01 DIAGNOSIS — Z79899 Other long term (current) drug therapy: Secondary | ICD-10-CM | POA: Insufficient documentation

## 2016-10-01 DIAGNOSIS — I10 Essential (primary) hypertension: Secondary | ICD-10-CM | POA: Diagnosis not present

## 2016-10-01 DIAGNOSIS — L298 Other pruritus: Secondary | ICD-10-CM

## 2016-10-01 DIAGNOSIS — Z794 Long term (current) use of insulin: Secondary | ICD-10-CM | POA: Insufficient documentation

## 2016-10-01 DIAGNOSIS — E119 Type 2 diabetes mellitus without complications: Secondary | ICD-10-CM | POA: Insufficient documentation

## 2016-10-01 DIAGNOSIS — N3001 Acute cystitis with hematuria: Secondary | ICD-10-CM

## 2016-10-01 LAB — POCT URINALYSIS DIP (DEVICE)
Bilirubin Urine: NEGATIVE
Glucose, UA: 500 mg/dL — AB
Nitrite: NEGATIVE
Protein, ur: NEGATIVE mg/dL
Specific Gravity, Urine: 1.015 (ref 1.005–1.030)
Urobilinogen, UA: 0.2 mg/dL (ref 0.0–1.0)
pH: 5.5 (ref 5.0–8.0)

## 2016-10-01 MED ORDER — FLUCONAZOLE 150 MG PO TABS: 150.0000 mg | ORAL_TABLET | Freq: Every day | ORAL | 0 refills | Status: AC

## 2016-10-01 MED ORDER — NITROFURANTOIN MONOHYD MACRO 100 MG PO CAPS: 100.0000 mg | ORAL_CAPSULE | Freq: Two times a day (BID) | ORAL | 0 refills | Status: AC

## 2016-10-01 NOTE — ED Triage Notes (Signed)
Here for UTI sx onset 5 days associated w/dysuria, urinary freq/urgency  Denies fevers  Taking OTC AZO w/no relief.   A&O x4... NAD

## 2016-10-01 NOTE — ED Provider Notes (Signed)
CSN: [id number]     Arrival date & time 10/01/16  1501 History   First MD Initiated Contact with Patient 10/01/16 1707     Chief Complaint  Patient presents with  . Urinary Tract Infection   (Consider location/radiation/quality/duration/timing/severity/associated sxs/prior Treatment)  Patient is here for possible UTI. Patient reports started having symptoms 5 days ago with sudden onset of dysuria, urinary frequency and urgency. She also had a yeast infection during this time and got self-treated with monistat OTC however without complete relief. She continues to endorse vaginal itchiness and thick vaginal discharge. She is not sexually active. She denies abdominal pain, N/V, flank pain, fever.        Past Medical History:  Diagnosis Date  . Diabetes mellitus   . Hyperlipemia   . Hypertension   . Psoriasis    Past Surgical History:  Procedure Laterality Date  . ANAL FISSURE REPAIR    . ENDOMETRIAL ABLATION     History reviewed. No pertinent family history. Social History  Substance Use Topics  . Smoking status: Never Smoker  . Smokeless tobacco: Never Used  . Alcohol use No   OB History    No data available     Review of Systems  Constitutional:       As stated in the HPI    Allergies  Oxycodone-acetaminophen  Home Medications   Prior to Admission medications   Medication Sig Start Date End Date Taking? Authorizing Provider  amLODipine (NORVASC) 10 MG tablet Take 1 tablet (10 mg total) by mouth daily. For high blood pressure 05/30/12  Yes Andrena Mews, DO  aspirin (ASPIR-LOW) 81 MG EC tablet Take 81 mg by mouth every morning.    Yes Historical Provider, MD  Cholecalciferol (VITAMIN D PO) Take 1 capsule by mouth daily.   Yes Historical Provider, MD  hydrochlorothiazide (HYDRODIURIL) 25 MG tablet Take 25 mg by mouth daily.  03/21/11  Yes Edd Arbour, MD  insulin glargine (LANTUS) 100 UNIT/ML injection Inject 60 Units into the skin at bedtime.    Yes Historical  Provider, MD  lisinopril (PRINIVIL,ZESTRIL) 40 MG tablet Take 1 tablet (40 mg total) by mouth daily. 04/02/12  Yes Andrena Mews, DO  metFORMIN (GLUCOPHAGE) 1000 MG tablet Take 1 tablet (1,000 mg total) by mouth 2 (two) times daily. 05/30/12  Yes Andrena Mews, DO  nystatin (MYCOSTATIN/NYSTOP) powder Apply topically 4 (four) times daily. 07/22/16  Yes Deatra Canter, FNP  nystatin cream (MYCOSTATIN) Apply to affected area 2 times daily 07/22/16  Yes Deatra Canter, FNP  pioglitazone (ACTOS) 30 MG tablet Take 1 tablet (30 mg total) by mouth daily. 04/02/12  Yes Andrena Mews, DO  simvastatin (ZOCOR) 20 MG tablet Take 1 tablet (20 mg total) by mouth at bedtime. 05/30/12  Yes Andrena Mews, DO  aspirin (ECOTRIN) 325 MG EC tablet Take 325 mg by mouth every morning.     Historical Provider, MD  fluconazole (DIFLUCAN) 150 MG tablet Take 1 tablet (150 mg total) by mouth daily. Once today, repeat in 3 days 10/01/16 10/03/16  Lucia Estelle, NP  nitrofurantoin, macrocrystal-monohydrate, (MACROBID) 100 MG capsule Take 1 capsule (100 mg total) by mouth 2 (two) times daily. 10/01/16 10/06/16  Lucia Estelle, NP  UNIFINE PENTIPS 31G X 6 MM MISC USE TO INJECT INSULIN 03/21/11   Edd Arbour, MD   Meds Ordered and Administered this Visit  Medications - No data to display  BP 135/74 (BP Location: Left Arm)   Pulse 94  Temp 98.4 F (36.9 C) (Oral)   Resp 16   SpO2 97%  No data found.   Physical Exam  Constitutional: She is oriented to person, place, and time. She appears well-developed and well-nourished.  Cardiovascular: Normal rate, regular rhythm and normal heart sounds.   Pulmonary/Chest: Effort normal and breath sounds normal.  Abdominal: Soft. Bowel sounds are normal. She exhibits no distension. There is no tenderness.  Genitourinary:  Genitourinary Comments: Negative CVA tenderness. She declines pelvic exam  Neurological: She is alert and oriented to person, place, and time.  Skin: Skin is warm  and dry.  Nursing note and vitals reviewed.   Urgent Care Course     Procedures (including critical care time)  Labs Review Labs Reviewed  POCT URINALYSIS DIP (DEVICE) - Abnormal; Notable for the following:       Result Value   Glucose, UA 500 (*)    Ketones, ur TRACE (*)    Hgb urine dipstick TRACE (*)    Leukocytes, UA TRACE (*)    All other components within normal limits  URINE CULTURE    Imaging Review No results found.    MDM   1. Acute cystitis with hematuria   2. Vaginal itching    1) Will treat presumptively for UTI with Macrobid BID x 5 days. Urine culture pending 2) She refused pelvic exam today; will treat presumptively for yeast vaginitis with Diflucan 150 mg once, repeat in 72 hours.  3) Informed to f/u or return for no improvement.     Lucia Estelle, NP 10/01/16 1727

## 2016-10-03 LAB — URINE CULTURE

## 2017-08-31 DIAGNOSIS — I10 Essential (primary) hypertension: Secondary | ICD-10-CM | POA: Diagnosis not present

## 2017-08-31 DIAGNOSIS — E1165 Type 2 diabetes mellitus with hyperglycemia: Secondary | ICD-10-CM | POA: Diagnosis not present

## 2017-08-31 DIAGNOSIS — E7800 Pure hypercholesterolemia, unspecified: Secondary | ICD-10-CM | POA: Diagnosis not present

## 2017-08-31 DIAGNOSIS — R5383 Other fatigue: Secondary | ICD-10-CM | POA: Diagnosis not present

## 2018-01-11 DIAGNOSIS — Z0000 Encounter for general adult medical examination without abnormal findings: Secondary | ICD-10-CM | POA: Diagnosis not present

## 2018-01-11 DIAGNOSIS — M109 Gout, unspecified: Secondary | ICD-10-CM | POA: Diagnosis not present

## 2018-01-11 DIAGNOSIS — E1165 Type 2 diabetes mellitus with hyperglycemia: Secondary | ICD-10-CM | POA: Diagnosis not present

## 2018-01-11 DIAGNOSIS — Z114 Encounter for screening for human immunodeficiency virus [HIV]: Secondary | ICD-10-CM | POA: Diagnosis not present

## 2018-01-11 DIAGNOSIS — E559 Vitamin D deficiency, unspecified: Secondary | ICD-10-CM | POA: Diagnosis not present

## 2018-01-17 DIAGNOSIS — R9431 Abnormal electrocardiogram [ECG] [EKG]: Secondary | ICD-10-CM | POA: Diagnosis not present

## 2018-01-18 DIAGNOSIS — M79605 Pain in left leg: Secondary | ICD-10-CM | POA: Diagnosis not present

## 2018-01-18 DIAGNOSIS — M79604 Pain in right leg: Secondary | ICD-10-CM | POA: Diagnosis not present

## 2018-01-18 DIAGNOSIS — M7989 Other specified soft tissue disorders: Secondary | ICD-10-CM | POA: Diagnosis not present

## 2018-02-15 DIAGNOSIS — M79604 Pain in right leg: Secondary | ICD-10-CM | POA: Diagnosis not present

## 2018-02-15 DIAGNOSIS — R350 Frequency of micturition: Secondary | ICD-10-CM | POA: Diagnosis not present

## 2018-02-15 DIAGNOSIS — M79605 Pain in left leg: Secondary | ICD-10-CM | POA: Diagnosis not present

## 2018-02-15 DIAGNOSIS — I10 Essential (primary) hypertension: Secondary | ICD-10-CM | POA: Diagnosis not present

## 2018-02-15 DIAGNOSIS — N390 Urinary tract infection, site not specified: Secondary | ICD-10-CM | POA: Diagnosis not present

## 2018-02-15 DIAGNOSIS — E1165 Type 2 diabetes mellitus with hyperglycemia: Secondary | ICD-10-CM | POA: Diagnosis not present

## 2018-02-21 DIAGNOSIS — M2012 Hallux valgus (acquired), left foot: Secondary | ICD-10-CM | POA: Diagnosis not present

## 2018-02-21 DIAGNOSIS — M2011 Hallux valgus (acquired), right foot: Secondary | ICD-10-CM | POA: Diagnosis not present

## 2018-02-21 DIAGNOSIS — E1165 Type 2 diabetes mellitus with hyperglycemia: Secondary | ICD-10-CM | POA: Diagnosis not present

## 2018-03-15 DIAGNOSIS — E1165 Type 2 diabetes mellitus with hyperglycemia: Secondary | ICD-10-CM | POA: Diagnosis not present

## 2018-06-11 DIAGNOSIS — E119 Type 2 diabetes mellitus without complications: Secondary | ICD-10-CM | POA: Diagnosis not present

## 2018-06-14 DIAGNOSIS — N390 Urinary tract infection, site not specified: Secondary | ICD-10-CM | POA: Diagnosis not present

## 2018-06-14 DIAGNOSIS — E7800 Pure hypercholesterolemia, unspecified: Secondary | ICD-10-CM | POA: Diagnosis not present

## 2018-06-14 DIAGNOSIS — R350 Frequency of micturition: Secondary | ICD-10-CM | POA: Diagnosis not present

## 2018-06-14 DIAGNOSIS — Z79899 Other long term (current) drug therapy: Secondary | ICD-10-CM | POA: Diagnosis not present

## 2018-06-14 DIAGNOSIS — E1165 Type 2 diabetes mellitus with hyperglycemia: Secondary | ICD-10-CM | POA: Diagnosis not present

## 2018-06-14 DIAGNOSIS — E559 Vitamin D deficiency, unspecified: Secondary | ICD-10-CM | POA: Diagnosis not present

## 2018-06-14 DIAGNOSIS — A499 Bacterial infection, unspecified: Secondary | ICD-10-CM | POA: Diagnosis not present

## 2018-07-18 DIAGNOSIS — E119 Type 2 diabetes mellitus without complications: Secondary | ICD-10-CM | POA: Diagnosis not present

## 2018-07-30 DIAGNOSIS — E7800 Pure hypercholesterolemia, unspecified: Secondary | ICD-10-CM | POA: Diagnosis not present

## 2018-07-30 DIAGNOSIS — E559 Vitamin D deficiency, unspecified: Secondary | ICD-10-CM | POA: Diagnosis not present

## 2018-07-30 DIAGNOSIS — R350 Frequency of micturition: Secondary | ICD-10-CM | POA: Diagnosis not present

## 2018-07-30 DIAGNOSIS — E1165 Type 2 diabetes mellitus with hyperglycemia: Secondary | ICD-10-CM | POA: Diagnosis not present

## 2018-07-30 DIAGNOSIS — N949 Unspecified condition associated with female genital organs and menstrual cycle: Secondary | ICD-10-CM | POA: Diagnosis not present

## 2018-09-06 DIAGNOSIS — E119 Type 2 diabetes mellitus without complications: Secondary | ICD-10-CM | POA: Diagnosis not present

## 2018-09-10 ENCOUNTER — Ambulatory Visit: Payer: Medicaid Other | Admitting: Internal Medicine

## 2018-09-10 ENCOUNTER — Other Ambulatory Visit: Payer: Self-pay

## 2018-09-10 ENCOUNTER — Encounter: Payer: Self-pay | Admitting: Internal Medicine

## 2018-09-10 VITALS — BP 126/80 | HR 97 | Temp 97.9°F | Ht 65.0 in | Wt 223.0 lb

## 2018-09-10 DIAGNOSIS — E1159 Type 2 diabetes mellitus with other circulatory complications: Secondary | ICD-10-CM

## 2018-09-10 DIAGNOSIS — E1142 Type 2 diabetes mellitus with diabetic polyneuropathy: Secondary | ICD-10-CM

## 2018-09-10 DIAGNOSIS — Z794 Long term (current) use of insulin: Secondary | ICD-10-CM

## 2018-09-10 DIAGNOSIS — E1165 Type 2 diabetes mellitus with hyperglycemia: Secondary | ICD-10-CM | POA: Diagnosis not present

## 2018-09-10 LAB — POCT GLYCOSYLATED HEMOGLOBIN (HGB A1C): Hemoglobin A1C: 11 % — AB (ref 4.0–5.6)

## 2018-09-10 MED ORDER — DULAGLUTIDE 0.75 MG/0.5ML ~~LOC~~ SOAJ: 0.7500 mg | SUBCUTANEOUS | 11 refills | Status: DC

## 2018-09-10 NOTE — Progress Notes (Addendum)
Name: Whitney Sloan  MRN/ DOB: [id number], 1956/01/08   Age/ Sex: 63 y.o., female    PCP: Norval Gable, DO   Reason for Endocrinology Evaluation: Type 2 Diabetes Mellitus     Date of Initial Endocrinology Visit: 09/10/2018     PATIENT IDENTIFIER: Ms. ALAYJA ARJONA is a 63 y.o. female with a past medical history of HTN, T2DM,and Dyslipidemia. The patient presented for initial endocrinology clinic visit on 09/10/2018 for consultative assistance with her diabetes management.    HPI: Ms. Langerman is here with her daughter's boyfriend Elijah    Diagnosed with T2DM 2008 Prior Medications tried/Intolerance: Metformin, Glipizide, Januvia, actos  Currently checking blood sugars 1 x / day.  Hypoglycemia episodes :  No  Hemoglobin A1c has ranged from 8.0% in 2011, peaking at 12.5% in 2010. Patient required assistance for hypoglycemia: no Patient has required hospitalization within the last 1 year from hyper or hypoglycemia: no  In terms of diet, the patient drinks sugar-sweetened beverages, eats 2 meals a day and snacks at bedtime .   Pt with non-compliance with medication intake, has been off all meds for the month of February.   HOME DIABETES REGIMEN: Lantus 70 units QHS Invokana 300 mg daily  Metformin 1000 mg BID  Pioglitazone 30 mg daily - not taking > 1 yr   Statin: Yes ACE-I/ARB: Yes Prior Diabetic Education: no   METER DOWNLOAD SUMMARY: Did not bring    DIABETIC COMPLICATIONS: Microvascular complications:   Neuropathy   Denies: retinopathy   Last eye exam: Completed 2018  Macrovascular complications:   CVA (2010)  Denies: CAD, PVD   PAST HISTORY: Past Medical History:  Past Medical History:  Diagnosis Date  . Diabetes mellitus   . Hyperlipemia   . Hypertension   . Psoriasis    Past Surgical History:  Past Surgical History:  Procedure Laterality Date  . ANAL FISSURE REPAIR    . ENDOMETRIAL ABLATION        Social History:  reports that  she has never smoked. She has never used smokeless tobacco. She reports that she does not drink alcohol or use drugs. Family History:  Family History  Problem Relation Age of Onset  . Dementia Mother   . Renal Disease Father   . Diabetes Other      HOME MEDICATIONS: Allergies as of 09/10/2018      Reactions   Oxycodone-acetaminophen Nausea And Vomiting      Medication List       Accurate as of September 10, 2018  3:21 PM. Always use your most recent med list.        amLODipine 10 MG tablet Commonly known as:  NORVASC Take 1 tablet (10 mg total) by mouth daily. For high blood pressure   Aspir-Low 81 MG EC tablet Generic drug:  aspirin Take 81 mg by mouth every morning.   hydrochlorothiazide 25 MG tablet Commonly known as:  HYDRODIURIL Take 25 mg by mouth daily.   insulin glargine 100 UNIT/ML injection Commonly known as:  LANTUS Inject 60 Units into the skin at bedtime.   Invokana 300 MG Tabs tablet Generic drug:  canagliflozin 300 mg.   lisinopril 40 MG tablet Commonly known as:  PRINIVIL,ZESTRIL Take 1 tablet (40 mg total) by mouth daily.   metFORMIN 1000 MG tablet Commonly known as:  GLUCOPHAGE Take 1 tablet (1,000 mg total) by mouth 2 (two) times daily.   nystatin cream Commonly known as:  MYCOSTATIN Apply to affected area 2 times  daily   nystatin powder Commonly known as:  MYCOSTATIN/NYSTOP Apply topically 4 (four) times daily.   pioglitazone 30 MG tablet Commonly known as:  Actos Take 1 tablet (30 mg total) by mouth daily.   simvastatin 20 MG tablet Commonly known as:  ZOCOR Take 1 tablet (20 mg total) by mouth at bedtime.   Unifine Pentips 31G X 6 MM Misc Generic drug:  Insulin Pen Needle USE TO INJECT INSULIN   VITAMIN D PO Take 1 capsule by mouth daily.        ALLERGIES: Allergies  Allergen Reactions  . Oxycodone-Acetaminophen Nausea And Vomiting     REVIEW OF SYSTEMS: A comprehensive ROS was conducted with the patient and is  negative except as per HPI and below:  Review of Systems  Constitutional: Negative for fever and weight loss.  HENT: Negative for congestion and sore throat.   Eyes: Negative for blurred vision and pain.  Respiratory: Negative for cough and shortness of breath.   Cardiovascular: Negative for chest pain and palpitations.  Gastrointestinal: Negative for diarrhea and nausea.  Genitourinary: Positive for frequency.  Skin: Negative.   Neurological: Positive for tingling. Negative for tremors.  Endo/Heme/Allergies: Positive for polydipsia.  Psychiatric/Behavioral: Negative for depression. The patient is not nervous/anxious.       OBJECTIVE:   VITAL SIGNS: BP 126/80   Pulse 97   Temp 97.9 F (36.6 C)   Ht 5\' 5"  (1.651 m)   Wt 223 lb (101.2 kg)   SpO2 95%   BMI 37.11 kg/m    PHYSICAL EXAM:  General: Pt appears well and is in NAD  Hydration: Well-hydrated with moist mucous membranes and good skin turgor  HEENT: Head: Unremarkable with good dentition. Oropharynx clear without exudate.  Eyes: External eye exam normal without stare, lid lag or exophthalmos.  EOM intact.  PERRL.  Neck: General: Supple without adenopathy or carotid bruits. Thyroid: Thyroid size normal.  No goiter or nodules appreciated. No thyroid bruit.  Lungs: Clear with good BS bilat with no rales, rhonchi, or wheezes  Heart: RRR with normal S1 and S2 and no gallops; no murmurs; no rub  Abdomen: Normoactive bowel sounds, soft, nontender, without masses or organomegaly palpable  Extremities:  Lower extremities - No pretibial edema. No lesions.  Skin: Normal texture and temperature to palpation. No rash noted. No Acanthosis nigricans/skin tags. No lipohypertrophy.  Neuro: MS is good with appropriate affect, pt is alert and Ox3    DM foot exam: 09/11/18 The skin of the feet is intact without sores or ulcerations.Plantar callous formation noted  The pedal pulses are 2+ on right and 2+ on left. The sensation is  decreased on the left to a screening 5.07, 10 gram monofilament    DATA REVIEWED:  Lab Results  Component Value Date   HGBA1C 11.0 (A) 09/10/2018   HGBA1C 13.8 03/23/2010   HGBA1C 11.4 12/02/2009   Lab Results  Component Value Date   LDLCALC 245 (H) 03/23/2010   CREATININE 0.73 09/20/2011    Lab Results  Component Value Date   CHOL 331 (H) 03/23/2010   HDL 46 03/23/2010   LDLCALC 245 (H) 03/23/2010   TRIG 201 (H) 03/23/2010   CHOLHDL 7.2 Ratio 03/23/2010        ASSESSMENT / PLAN / RECOMMENDATIONS:   1) Type 2 Diabetes Mellitus,Poorly controlled, With neuropathiccomplications - Most recent A1c of 11.0 %. Goal A1c < 7.0 %.    Plan: GENERAL: I have discussed with the patient the pathophysiology of  diabetes. We went over the natural progression of the disease. We talked about both insulin resistance and insulin deficiency. We stressed the importance of lifestyle changes including diet and exercise. I explained the complications associated with diabetes including retinopathy, nephropathy, neuropathy as well as increased risk of cardiovascular disease. We went over the benefit seen with glycemic control.    I explained to the patient that diabetic patients are at higher than normal risk for amputations. The patient was informed that diabetes is the number one cause of non-traumatic amputations in [place].  Discussed pharmacokinetics of basal insulin.  We also discussed avoiding sugar-sweetened beverages and snacks, when possible.    MEDICATIONS: - Decrease Lantus to 58 units daily  - Bydureon 2 mg weekly  - Continue Metformin 1000 mg twice a day  - Continue Invokana 300 mg daily   EDUCATION / INSTRUCTIONS:  BG monitoring instructions: Patient is instructed to check her blood sugars 2 times a day, fasting and bedtime.  Call [place] [facility] if: BG persistently < 70 or > 300. . I reviewed the Rule of 15 for the treatment of hypoglycemia in detail with the  patient. Literature supplied.   2) Diabetic complications:   Eye: Does not have known diabetic retinopathy.   Neuro/ Feet: Does  have known diabetic peripheral neuropathy.  Renal: Patient does not have known baseline CKD. She is not on an ACEI/ARB at present.   3) Lipids: Patient is on a statin.    4) Hypertension: She is  at goal of < 140/90 mmHg.    F/u in 6 weeks   Addendum: insurance does not cover trulicity but they would cover bydureon, prescription sent   Signed electronically by: Lyndle Herrlich, MD  [facility] Endocrinology  [facility] [address] [place], [place] [address] Phone: [phone] FAX: [phone]   CC: Norval Gable, DO [address] [place] [address] Phone: [phone]  Fax: [phone]    Return to Endocrinology clinic as below: No future appointments.

## 2018-09-10 NOTE — Patient Instructions (Addendum)
-   Decrease Lantus to 58 units daily  - Start Trulicity 0.75 mg weekly  - Continue Metformin 1000 mg twice a day  - Continue Invokana 300 mg daily   - check sugars twice a day (fasting and bedtime)   Choose healthy, lower carb lower calorie snacks: toss salad, cooked vegetables, cottage cheese, peanut butter, low fat cheese / string cheese, lower sodium deli meat, tuna salad or chicken salad    HOW TO TREAT LOW BLOOD SUGARS (Blood sugar LESS THAN 70 MG/DL)  Please follow the RULE OF 15 for the treatment of hypoglycemia treatment (when your (blood sugars are less than 70 mg/dL)    STEP 1: Take 15 grams of carbohydrates when your blood sugar is low, which includes:   3-4 GLUCOSE TABS  OR  3-4 OZ OF JUICE OR REGULAR SODA OR  ONE TUBE OF GLUCOSE GEL     STEP 2: RECHECK blood sugar in 15 MINUTES STEP 3: If your blood sugar is still low at the 15 minute recheck --> then, go back to STEP 1 and treat AGAIN with another 15 grams of carbohydrates.

## 2018-09-11 ENCOUNTER — Telehealth: Payer: Self-pay | Admitting: Internal Medicine

## 2018-09-11 MED ORDER — EXENATIDE ER 2 MG ~~LOC~~ PEN: 2.0000 mg | PEN_INJECTOR | SUBCUTANEOUS | 6 refills | Status: DC

## 2018-09-11 NOTE — Telephone Encounter (Signed)
Per [facility], "Caller states retuning a call to the office re med change. Was seen yesterday, RX was called in at [facility]."

## 2018-09-11 NOTE — Addendum Note (Signed)
Addended by: Scarlette Shorts on: 09/11/2018 09:14 AM   Modules accepted: Orders

## 2018-09-12 NOTE — Telephone Encounter (Signed)
Returned pt call lft vm

## 2018-09-18 ENCOUNTER — Telehealth: Payer: Self-pay | Admitting: Internal Medicine

## 2018-09-18 NOTE — Telephone Encounter (Signed)
Returned call lft vm

## 2018-09-18 NOTE — Telephone Encounter (Signed)
Patient has called requesting to speak with a nurse, she is unsure about using Exenatide ER (BYDUREON) 2 MG PEN and needs instructions.  Please Advise, Thanks

## 2018-09-18 NOTE — Telephone Encounter (Signed)
Patient returned call

## 2018-09-18 NOTE — Telephone Encounter (Signed)
Pt could not understand instructions over the phone for injecting of bydureon so I asked pt to come by the office and informed her of office hours

## 2018-09-26 DIAGNOSIS — E1165 Type 2 diabetes mellitus with hyperglycemia: Secondary | ICD-10-CM | POA: Diagnosis not present

## 2018-09-28 ENCOUNTER — Ambulatory Visit: Payer: Medicaid Other | Admitting: Dietician

## 2018-10-22 ENCOUNTER — Ambulatory Visit: Payer: Medicaid Other | Admitting: Internal Medicine

## 2018-10-23 ENCOUNTER — Other Ambulatory Visit: Payer: Self-pay

## 2018-10-23 ENCOUNTER — Encounter: Payer: Self-pay | Admitting: Internal Medicine

## 2018-10-23 ENCOUNTER — Ambulatory Visit (INDEPENDENT_AMBULATORY_CARE_PROVIDER_SITE_OTHER): Payer: Medicaid Other | Admitting: Internal Medicine

## 2018-10-23 DIAGNOSIS — E1142 Type 2 diabetes mellitus with diabetic polyneuropathy: Secondary | ICD-10-CM

## 2018-10-23 DIAGNOSIS — E1165 Type 2 diabetes mellitus with hyperglycemia: Secondary | ICD-10-CM | POA: Diagnosis not present

## 2018-10-23 DIAGNOSIS — E1159 Type 2 diabetes mellitus with other circulatory complications: Secondary | ICD-10-CM

## 2018-10-23 DIAGNOSIS — Z794 Long term (current) use of insulin: Secondary | ICD-10-CM

## 2018-10-23 NOTE — Progress Notes (Signed)
Virtual Visit via Video Note  I connected with Marline Backbone on 10/23/18 at  9:10 AM EDT by a video enabled telemedicine application and verified that I am speaking with the correct person using two identifiers.   I discussed the limitations of evaluation and management by telemedicine and the availability of in person appointments. The patient expressed understanding and agreed to proceed.   -Location of the patient : Home  -Location of the provider : Office -The names of all persons participating in the telemedicine service : Pt and myself , Elijah (daughter's boyfriend)       Name: Whitney Sloan  Age/ Sex: 39 y.o., female   MRN/ DOB: [id number], 06/28/1955     PCP: Norval Gable, DO   Reason for Endocrinology Evaluation: Type 2 Diabetes Mellitus     Initial Endocrinology Clinic Visit: 09/10/2018    PATIENT IDENTIFIER: Whitney Sloan is a 63 y.o. female with a past medical history of HTN, T2DM,and Dyslipidemia The patient has followed with Endocrinology clinic since 09/10/2018 for consultative assistance with management of her diabetes.  DIABETIC HISTORY:  Whitney Sloan was diagnosed with T2DM in 2008, she has been on Metformin, Glipizide, Januvia and Actos in the past.She has been on insulin therapy for years. Her hemoglobin A1c has ranged from 8.0% in 2011, peaking at 12.5% in 2010.   On her initial visit to our clinic her A1c was 11.0% , she was on lantus, Invokana, Metfomin and Pioglitazone but she was not taking them.    Bydureon started in 08/2018 SUBJECTIVE:   During the last visit (08/15/2018): A1c was 11.0%. Started Bydureon, decreased lantus, continued invokana and Metformin.   Today (10/23/2018): [name] is here for a 6 week virtual follow up on diabetes.   She checks her blood sugars 1 times daily, preprandial to breakfast. The patient has not had hypoglycemic episodes since the last clinic visit. Otherwise, the patient has not required any recent emergency  interventions for hypoglycemia and has not had recent hospitalizations secondary to hyper or hypoglycemic episodes.  She admits to continued drinking of sugar sweetened beverages, but has cut down from 2 liters a day of coke to 12 ounce.  She is doing better with snacks She is concerned about the bydureon pen. The size scares her but it doesn't hurt and has to use the help of daughter and Elijah    ROS: As per HPI and as detailed below: Review of Systems  Constitutional: Negative for chills and fever.  HENT: Negative for congestion and sore throat.   Respiratory: Negative for cough and shortness of breath.   Cardiovascular: Negative for chest pain and palpitations.  Gastrointestinal: Negative for diarrhea and nausea.      HOME DIABETES REGIMEN:  Lantus to 58 units daily  Bydureon 2 mg weekly  Metformin 1000 mg twice a day - taking once a day  Invokana 300 mg daily   GLUCOSE LOG:  10/23/2018   184 5/11             160 5/10             107     HISTORY:  Past Medical History:  Past Medical History:  Diagnosis Date   Diabetes mellitus    Hyperlipemia    Hypertension    Psoriasis    Past Surgical History:  Past Surgical History:  Procedure Laterality Date   ANAL FISSURE REPAIR     ENDOMETRIAL ABLATION     Social History:  reports that she has never smoked. She has never used smokeless tobacco. She reports that she does not drink alcohol or use drugs. Family History:  Family History  Problem Relation Age of Onset   Dementia Mother    Renal Disease Father    Diabetes Other      HOME MEDICATIONS: Allergies as of 10/23/2018      Reactions   Oxycodone-acetaminophen Nausea And Vomiting      Medication List       Accurate as of Oct 23, 2018  8:07 AM. If you have any questions, ask your nurse or doctor.        amLODipine 10 MG tablet Commonly known as:  NORVASC Take 1 tablet (10 mg total) by mouth daily. For high blood pressure   Aspir-Low 81 MG EC  tablet Generic drug:  aspirin Take 81 mg by mouth every morning.   Exenatide ER 2 MG Pen Commonly known as:  Bydureon Inject 2 mg into the skin once a week.   hydrochlorothiazide 25 MG tablet Commonly known as:  HYDRODIURIL Take 25 mg by mouth daily.   insulin glargine 100 UNIT/ML injection Commonly known as:  LANTUS Inject 58 Units into the skin at bedtime.   Invokana 300 MG Tabs tablet Generic drug:  canagliflozin 300 mg.   lisinopril 40 MG tablet Commonly known as:  ZESTRIL Take 1 tablet (40 mg total) by mouth daily.   metFORMIN 1000 MG tablet Commonly known as:  GLUCOPHAGE Take 1 tablet (1,000 mg total) by mouth 2 (two) times daily.   nystatin cream Commonly known as:  MYCOSTATIN Apply to affected area 2 times daily   nystatin powder Commonly known as:  MYCOSTATIN/NYSTOP Apply topically 4 (four) times daily.   simvastatin 20 MG tablet Commonly known as:  ZOCOR Take 1 tablet (20 mg total) by mouth at bedtime.   Unifine Pentips 31G X 6 MM Misc Generic drug:  Insulin Pen Needle USE TO INJECT INSULIN   VITAMIN D PO Take 1 capsule by mouth daily.         DATA REVIEWED:  Lab Results  Component Value Date   HGBA1C 11.0 (A) 09/10/2018   HGBA1C 13.8 03/23/2010   HGBA1C 11.4 12/02/2009   ASSESSMENT / PLAN / RECOMMENDATIONS:   1) Type 2 Diabetes Mellitus,Poorly controlled, With neuropathic complications - Most recent A1c of 11.0 %. Goal A1c < 7.0 %.    Plan:  - Her BG's have been acceptable based on fasting readings, bedtime readings are not available. I have encouraged her to try and check bedtime glucose as well.  - I have praised her on medication compliance and encouraged her to use coke zero instead of regular coke.   - She is not very happy with the bydureon pen, it doesn't hurt her but the size of the pen is intimidating to her and has to use the help of daughter or Elijah, I explained to her that this is preferred by her insurance as well as  [employer], I gave her the option of switching but she did not find another daily injection very appealing.  - Will see how she does on next visit and consider other alternatives.  - She has been taking Metformin once a day, will increase to BID dosing.     MEDICATIONS: Decrease Lantus to 50 units daily  Increase Metformin to 1000 mg BID  Continue Bydureon 2 mg weekly Continue invokana 300 mg daily   EDUCATION / INSTRUCTIONS: BG monitoring instructions: Patient is  instructed to check her blood sugars 2 times a day, fasting and bedtime. Call  Endocrinology clinic if: BG persistently < 70 or > 300. I reviewed the Rule of 15 for the treatment of hypoglycemia in detail with the patient. Literature supplied.    I discussed the assessment and treatment plan with the patient. The patient was provided an opportunity to ask questions and all were answered. The patient agreed with the plan and demonstrated an understanding of the instructions.   The patient was advised to call back or seek an in-person evaluation if the symptoms worsen or if the condition fails to improve as anticipated.    F/U in 3 months    Signed electronically by: Lyndle Herrlich, MD  [facility] Endocrinology  [facility] [address] [place], [place] [address] Phone: [phone] FAX: [phone]   CC: Norval Gable, DO [address] [place] [address] Phone: [phone]  Fax: [phone]  Return to Endocrinology clinic as below: Future Appointments  Date Time Provider Department Center  10/23/2018  9:10 AM Kymir Coles, Konrad Dolores, MD [id number] None

## 2018-12-12 ENCOUNTER — Other Ambulatory Visit: Payer: Self-pay

## 2018-12-12 DIAGNOSIS — Z20822 Contact with and (suspected) exposure to covid-19: Secondary | ICD-10-CM

## 2018-12-14 ENCOUNTER — Encounter (HOSPITAL_COMMUNITY): Payer: Self-pay | Admitting: Emergency Medicine

## 2018-12-14 ENCOUNTER — Ambulatory Visit (HOSPITAL_COMMUNITY)
Admission: EM | Admit: 2018-12-14 | Discharge: 2018-12-14 | Disposition: A | Discharge status: Home / Self Care | Payer: Medicaid Other | Attending: Family Medicine | Admitting: Family Medicine

## 2018-12-14 DIAGNOSIS — B373 Candidiasis of vulva and vagina: Secondary | ICD-10-CM

## 2018-12-14 DIAGNOSIS — B3731 Acute candidiasis of vulva and vagina: Secondary | ICD-10-CM

## 2018-12-14 MED ORDER — FLUCONAZOLE 200 MG PO TABS: 200.0000 mg | ORAL_TABLET | Freq: Once | ORAL | 0 refills | Status: AC

## 2018-12-14 NOTE — ED Provider Notes (Signed)
[facility]    CSN: [id number] Arrival date & time: 12/14/18  1140     History   Chief Complaint Chief Complaint  Patient presents with  . Appointment    1150  . Vaginitis    HPI Whitney Sloan is a 63 y.o. female with history of obesity, diabetes, hypertension presenting for acute concern of vaginal yeast infection.  Patient states that she has had symptoms for the last 2 weeks including thick, white discharge with vaginal itching and irritation.  Patient denies malodor, bloody discharge, dysuria, and burning with urination, urinary frequency/urgency.  Patient denies fever, myalgias, abdominal/pelvic pain, flank pain.  Last A1c was done on 3/30: 11%.  Patient states that she is working with her PCP to get better control of her sugars.  Patient denies polyuria, polydipsia, polyphagia. Of note, patient's blood pressure is elevated on initial presentation, decreased after sitting for 5 minutes.  Patient denies headache, change vision, chest pain, palpitations, lower extremity motor claudication.  Patient states that she took her blood pressure medications today, intends on following up with her PCP regarding this.   Past Medical History:  Diagnosis Date  . Diabetes mellitus   . Hyperlipemia   . Hypertension   . Psoriasis     Patient Active Problem List   Diagnosis Date Noted  . HYPERLIPIDEMIA 03/23/2010  . DECREASED HEARING, BILATERAL 02/05/2010  . MEMORY LOSS 02/05/2010  . INCONTINENCE 02/05/2010  . NEOPLASM, MALIGNANT, BREAST, HX OF 01/13/2010  . ANXIETY STATE, UNSPECIFIED 12/02/2009  . UNSTEADY GAIT 12/02/2009  . TOE PAIN 09/24/2009  . ANAL OR RECTAL PAIN 08/25/2009  . LEG EDEMA, BILATERAL 08/25/2009  . Type 2 diabetes mellitus with hyperglycemia, with long-term current use of insulin (HCC) 08/06/2009  . ESSENTIAL HYPERTENSION, BENIGN 08/06/2009  . DERMATITIS, ATOPIC 08/06/2009    Past Surgical History:  Procedure Laterality Date  . ANAL FISSURE REPAIR     . ENDOMETRIAL ABLATION      OB History   No obstetric history on file.      Home Medications    Prior to Admission medications   Medication Sig Start Date End Date Taking? Authorizing Provider  amLODipine (NORVASC) 10 MG tablet Take 1 tablet (10 mg total) by mouth daily. For high blood pressure 05/30/12   Andrena Mews, DO  aspirin (ASPIR-LOW) 81 MG EC tablet Take 81 mg by mouth every morning.     [provider]  Cholecalciferol (VITAMIN D PO) Take 1 capsule by mouth daily.    [provider]  Exenatide ER (BYDUREON) 2 MG PEN Inject 2 mg into the skin once a week. 09/11/18   Shamleffer, Konrad Dolores, MD  fluconazole (DIFLUCAN) 200 MG tablet Take 1 tablet (200 mg total) by mouth once for 1 dose. May repeat in 72 hours if needed 12/14/18 12/14/18  Hall-Potvin, [place], PA-C  hydrochlorothiazide (HYDRODIURIL) 25 MG tablet Take 25 mg by mouth daily.  03/21/11   Edd Arbour, MD  insulin glargine (LANTUS) 100 UNIT/ML injection Inject 50 Units into the skin at bedtime.    [provider]  INVOKANA 300 MG TABS tablet 300 mg. 08/18/18   [provider]  lisinopril (PRINIVIL,ZESTRIL) 40 MG tablet Take 1 tablet (40 mg total) by mouth daily. 04/02/12   Andrena Mews, DO  metFORMIN (GLUCOPHAGE) 1000 MG tablet Take 1 tablet (1,000 mg total) by mouth 2 (two) times daily. 05/30/12   Andrena Mews, DO  nystatin (MYCOSTATIN/NYSTOP) powder Apply topically 4 (four) times daily.  07/22/16   Deatra Canterxford, William J, FNP  nystatin cream (MYCOSTATIN) Apply to affected area 2 times daily 07/22/16   Deatra Canterxford, William J, FNP  simvastatin (ZOCOR) 20 MG tablet Take 1 tablet (20 mg total) by mouth at bedtime. 05/30/12   Andrena Mewsigby, Michael D, DO  UNIFINE PENTIPS 31G X 6 MM MISC USE TO INJECT INSULIN 03/21/11   Edd Arbourrton, Jonathan, MD    Family History Family History  Problem Relation Age of Onset  . Dementia Mother   . Renal Disease Father   . Diabetes Other     Social History Social  History   Tobacco Use  . Smoking status: Never Smoker  . Smokeless tobacco: Never Used  Substance Use Topics  . Alcohol use: No  . Drug use: No     Allergies   Oxycodone-acetaminophen   Review of Systems As per HPI   Physical Exam Triage Vital Signs ED Triage Vitals  Enc Vitals Group     BP 12/14/18 1220 (!) 181/106     Pulse Rate 12/14/18 1220 (!) 107     Resp 12/14/18 1220 18     Temp 12/14/18 1220 98 F (36.7 C)     Temp src --      SpO2 12/14/18 1220 96 %     Weight --      Height --      Head Circumference --      Peak Flow --      Pain Score 12/14/18 1221 0     Pain Loc --      Pain Edu? --      Excl. in GC? --    No data found.  Updated Vital Signs BP (!) 163/93   Pulse 95   Temp 98 F (36.7 C)   Resp 18   SpO2 96%   Visual Acuity Right Eye Distance:   Left Eye Distance:   Bilateral Distance:    Right Eye Near:   Left Eye Near:    Bilateral Near:     Physical Exam Constitutional:      General: She is not in acute distress. HENT:     Head: Normocephalic and atraumatic.  Eyes:     General: No scleral icterus.    Pupils: Pupils are equal, round, and reactive to light.  Cardiovascular:     Rate and Rhythm: Normal rate.  Pulmonary:     Effort: Pulmonary effort is normal.  Skin:    Coloration: Skin is not jaundiced or pale.  Neurological:     Mental Status: She is alert and oriented to person, place, and time.      UC Treatments / Results  Labs (all labs ordered are listed, but only abnormal results are displayed) Labs Reviewed - No data to display  EKG   Radiology No results found.  Procedures Procedures (including critical care time)  Medications Ordered in UC Medications - No data to display  Initial Impression / Assessment and Plan / UC Course  I have reviewed the triage vital signs and the nursing notes.  Pertinent labs & imaging results that were available during my care of the patient were reviewed by me and  considered in my medical decision making (see chart for details).     63 year old female with history of obesity, diabetes, recurrent yeast infection presenting for vaginal yeast infection.  Discussed importance of glycemic control to help prevent yeast infections.  Patient states that she is following up with her PCP regarding this.  Will  treat with Diflucan outpatient.  Return precautions discussed, patient verbalized understanding, is agreeable to plan. Final Clinical Impressions(s) / UC Diagnoses   Final diagnoses:  Vaginal yeast infection     Discharge Instructions     Take Diflucan as prescribed. Return if you develop fever, urinary symptoms, abdominal or back pain. Is important to control your diabetes tell prevent yeast infections as well as UTIs.  Follow-up with your PCP regarding this. Your blood pressure was elevated in office today.  Continue your blood pressure medications as prescribed.  Return for further evaluation if you develop headache, change in vision, chest pain, shortness of breath, severe abdominal pain.    ED Prescriptions    Medication Sig Dispense Auth. Provider   fluconazole (DIFLUCAN) 200 MG tablet Take 1 tablet (200 mg total) by mouth once for 1 dose. May repeat in 72 hours if needed 2 tablet Hall-Potvin, [place], PA-C     Controlled Substance Prescriptions [place] Controlled Substance Registry consulted? Not Applicable   Quincy Sheehan, [place] 12/14/18 1324

## 2018-12-14 NOTE — Discharge Instructions (Addendum)
Take Diflucan as prescribed. Return if you develop fever, urinary symptoms, abdominal or back pain. Is important to control your diabetes tell prevent yeast infections as well as UTIs.  Follow-up with your PCP regarding this. Your blood pressure was elevated in office today.  Continue your blood pressure medications as prescribed.  Return for further evaluation if you develop headache, change in vision, chest pain, shortness of breath, severe abdominal pain.

## 2018-12-14 NOTE — ED Triage Notes (Signed)
Pt c/o chronic yeast infections, states "its been going on a year." c/o vaginal discharge and itching. Pt states she is diabetic, states yesterdays blood sugar was 211.

## 2018-12-18 LAB — NOVEL CORONAVIRUS, NAA: SARS-CoV-2, NAA: NOT DETECTED

## 2018-12-18 LAB — SPECIMEN STATUS REPORT

## 2018-12-24 DIAGNOSIS — Z79899 Other long term (current) drug therapy: Secondary | ICD-10-CM | POA: Diagnosis not present

## 2018-12-24 DIAGNOSIS — Z1159 Encounter for screening for other viral diseases: Secondary | ICD-10-CM | POA: Diagnosis not present

## 2018-12-24 DIAGNOSIS — I10 Essential (primary) hypertension: Secondary | ICD-10-CM | POA: Diagnosis not present

## 2018-12-24 DIAGNOSIS — E7800 Pure hypercholesterolemia, unspecified: Secondary | ICD-10-CM | POA: Diagnosis not present

## 2019-02-17 DIAGNOSIS — R5383 Other fatigue: Secondary | ICD-10-CM | POA: Diagnosis not present

## 2019-02-17 DIAGNOSIS — E559 Vitamin D deficiency, unspecified: Secondary | ICD-10-CM | POA: Diagnosis not present

## 2019-02-17 DIAGNOSIS — Z1159 Encounter for screening for other viral diseases: Secondary | ICD-10-CM | POA: Diagnosis not present

## 2019-02-17 DIAGNOSIS — Z114 Encounter for screening for human immunodeficiency virus [HIV]: Secondary | ICD-10-CM | POA: Diagnosis not present

## 2019-02-17 DIAGNOSIS — Z0000 Encounter for general adult medical examination without abnormal findings: Secondary | ICD-10-CM | POA: Diagnosis not present

## 2019-03-06 DIAGNOSIS — Z1212 Encounter for screening for malignant neoplasm of rectum: Secondary | ICD-10-CM | POA: Diagnosis not present

## 2019-03-06 DIAGNOSIS — Z1211 Encounter for screening for malignant neoplasm of colon: Secondary | ICD-10-CM | POA: Diagnosis not present

## 2019-03-14 DIAGNOSIS — E559 Vitamin D deficiency, unspecified: Secondary | ICD-10-CM | POA: Diagnosis not present

## 2019-03-14 DIAGNOSIS — E7800 Pure hypercholesterolemia, unspecified: Secondary | ICD-10-CM | POA: Diagnosis not present

## 2019-03-14 DIAGNOSIS — Z8673 Personal history of transient ischemic attack (TIA), and cerebral infarction without residual deficits: Secondary | ICD-10-CM | POA: Diagnosis not present

## 2019-03-14 DIAGNOSIS — Z01419 Encounter for gynecological examination (general) (routine) without abnormal findings: Secondary | ICD-10-CM | POA: Diagnosis not present

## 2019-04-30 ENCOUNTER — Other Ambulatory Visit: Payer: Self-pay | Admitting: Internal Medicine

## 2019-04-30 ENCOUNTER — Telehealth: Payer: Self-pay | Admitting: Internal Medicine

## 2019-04-30 NOTE — Telephone Encounter (Signed)
Received refill request for pt's bydureon. Attempted to reach pt, no answer. Left vm to call back. Pt is overdue for a follow up visit. Will need to get this scheduled.

## 2019-04-30 NOTE — Telephone Encounter (Signed)
Please see telephone encounter

## 2019-05-02 NOTE — Telephone Encounter (Signed)
Sent in a #30 day supply with note that an appointment is needed for further refills

## 2019-05-03 ENCOUNTER — Other Ambulatory Visit: Payer: Self-pay

## 2019-05-06 ENCOUNTER — Ambulatory Visit (INDEPENDENT_AMBULATORY_CARE_PROVIDER_SITE_OTHER): Payer: Medicaid Other | Admitting: Internal Medicine

## 2019-05-06 ENCOUNTER — Other Ambulatory Visit: Payer: Self-pay

## 2019-05-06 ENCOUNTER — Encounter: Payer: Self-pay | Admitting: Internal Medicine

## 2019-05-06 VITALS — BP 148/68 | HR 92 | Temp 97.9°F | Ht 65.0 in | Wt 234.4 lb

## 2019-05-06 DIAGNOSIS — Z23 Encounter for immunization: Secondary | ICD-10-CM | POA: Diagnosis not present

## 2019-05-06 DIAGNOSIS — Z794 Long term (current) use of insulin: Secondary | ICD-10-CM

## 2019-05-06 DIAGNOSIS — E1165 Type 2 diabetes mellitus with hyperglycemia: Secondary | ICD-10-CM

## 2019-05-06 LAB — BASIC METABOLIC PANEL
BUN: 10 mg/dL (ref 6–23)
CO2: 30 mEq/L (ref 19–32)
Calcium: 9.3 mg/dL (ref 8.4–10.5)
Chloride: 101 mEq/L (ref 96–112)
Creatinine, Ser: 0.54 mg/dL (ref 0.40–1.20)
GFR: 137.58 mL/min (ref >60.00)
Glucose, Bld: 221 mg/dL — ABNORMAL HIGH (ref 70–99)
Potassium: 3.5 mEq/L (ref 3.5–5.1)
Sodium: 139 mEq/L (ref 135–145)

## 2019-05-06 LAB — GLUCOSE, POCT (MANUAL RESULT ENTRY): POC Glucose: 226 mg/dl — AB (ref 70–99)

## 2019-05-06 LAB — POCT GLYCOSYLATED HEMOGLOBIN (HGB A1C): Hemoglobin A1C: 12.4 % — AB (ref 4.0–5.6)

## 2019-05-06 NOTE — Progress Notes (Signed)
Name: Whitney Sloan  Age/ Sex: 63 y.o., female   MRN/ DOB: [id number], 09-Aug-1955     PCP: Norval Gable, DO   Reason for Endocrinology Evaluation: Type 2 Diabetes Mellitus  Initial Endocrine Consultative Visit: 09/10/2018    PATIENT IDENTIFIER: Whitney Sloan is a 63 y.o. female with a past medical history of HTN, T2DM and Dyslipidemia. The patient has followed with Endocrinology clinic since 09/10/2018 for consultative assistance with management of her diabetes.  DIABETIC HISTORY:  [name] was diagnosed with T2DM in 2008, she has been on Metformin, Glipizide, Januvia and Actos in the past.She has been on insulin therapy for years. Her hemoglobin A1c has ranged from 8.0%in 2011, peaking at12.5%in 2010.  On her initial visit to our clinic her A1c was 11.0% , she was on lantus, Invokana, Metfomin and Pioglitazone but she was not taking them. Bydureon was added.     SUBJECTIVE:   During the last visit (10/23/2018): A1 c 11.0% . Decreased Lantus , increased metformin and contnued Bydureon and invokana   Today (05/06/2019): [name] is here for a follow up on diabetes.  She checks her blood sugars 1 times daily, preprandial . The patient has not had hypoglycemic episodes since the last clinic visit. Otherwise, the patient has not required any recent emergency interventions for hypoglycemia and has not  had recent hospitalizations secondary to hyper or hypoglycemic episodes.    ROS: As per HPI and as detailed below: Review of Systems  Constitutional: Negative for chills and fever.  HENT: Negative for congestion and sore throat.   Respiratory: Negative for cough and shortness of breath.   Cardiovascular: Negative for chest pain and palpitations.  Gastrointestinal: Negative for diarrhea and nausea.      HOME DIABETES REGIMEN:   Lantus 50 units daily   Metformin to 1000 mg BID   Continue Bydureon 2 mg weekly  Continue invokana 300 mg daily     METER  DOWNLOAD SUMMARY: Did not bring     HISTORY:  Past Medical History:  Past Medical History:  Diagnosis Date  . Diabetes mellitus   . Hyperlipemia   . Hypertension   . Psoriasis    Past Surgical History:  Past Surgical History:  Procedure Laterality Date  . ANAL FISSURE REPAIR    . ENDOMETRIAL ABLATION      Social History:  reports that she has never smoked. She has never used smokeless tobacco. She reports that she does not drink alcohol or use drugs. Family History:  Family History  Problem Relation Age of Onset  . Dementia Mother   . Renal Disease Father   . Diabetes Other      HOME MEDICATIONS: Allergies as of 05/06/2019      Reactions   Oxycodone-acetaminophen Nausea And Vomiting      Medication List       Accurate as of May 06, 2019  4:24 PM. If you have any questions, ask your nurse or doctor.        amLODipine 10 MG tablet Commonly known as: NORVASC Take 1 tablet (10 mg total) by mouth daily. For high blood pressure   Aspir-Low 81 MG EC tablet Generic drug: aspirin Take 81 mg by mouth every morning.   Bydureon 2 MG Pen Generic drug: Exenatide ER INJECT 2 MG INTO THE SKIN ONCE A WEEK   hydrochlorothiazide 25 MG tablet Commonly known as: HYDRODIURIL Take 25 mg by mouth daily.   insulin glargine 100 UNIT/ML injection Commonly known  as: LANTUS Inject 50 Units into the skin at bedtime.   Invokana 300 MG Tabs tablet Generic drug: canagliflozin 300 mg.   lisinopril 40 MG tablet Commonly known as: ZESTRIL Take 1 tablet (40 mg total) by mouth daily.   metFORMIN 1000 MG tablet Commonly known as: GLUCOPHAGE Take 1 tablet (1,000 mg total) by mouth 2 (two) times daily.   nystatin cream Commonly known as: MYCOSTATIN Apply to affected area 2 times daily   nystatin powder Commonly known as: MYCOSTATIN/NYSTOP Apply topically 4 (four) times daily.   simvastatin 20 MG tablet Commonly known as: ZOCOR Take 1 tablet (20 mg total) by mouth at  bedtime.   Unifine Pentips 31G X 6 MM Misc Generic drug: Insulin Pen Needle USE TO INJECT INSULIN   VITAMIN D PO Take 1 capsule by mouth daily.        OBJECTIVE:   Vital Signs: BP (!) 148/68 (BP Location: Right Arm, Patient Position: Sitting, Cuff Size: Large)   Pulse 92   Temp 97.9 F (36.6 C)   Ht 5\' 5"  (1.651 m)   Wt 234 lb 6.4 oz (106.3 kg)   SpO2 97%   BMI 39.01 kg/m   Wt Readings from Last 3 Encounters:  05/06/19 234 lb 6.4 oz (106.3 kg)  09/10/18 223 lb (101.2 kg)  09/20/11 265 lb (120.2 kg)     Exam: General: Pt appears well and is in NAD  Lungs: Clear with good BS bilat with no rales, rhonchi, or wheezes  Heart: RRR with normal S1 and S2 and no gallops; no murmurs; no rub  Abdomen: Normoactive bowel sounds, soft, nontender, without masses or organomegaly palpable  Extremities: No pretibial edema.  Skin: Normal texture and temperature to palpation  Neuro: MS is good with appropriate affect, pt is alert and Ox3    DM Foot Exam 05/06/2019 The skin of the feet is intact without sores or ulcerations. The pedal pulses are decreased The sensation is decreased to a screening 5.07, 10 gram monofilament bilaterally            DATA REVIEWED:  Lab Results  Component Value Date   HGBA1C 12.4 (A) 05/06/2019   HGBA1C 11.0 (A) 09/10/2018   HGBA1C 13.8 03/23/2010        Results for MELANNY, WIRE (MRN [id number]) as of 05/06/2019 16:20  Ref. Range 05/06/2019 14:30  Sodium Latest Ref Range: 135 - 145 mEq/L 139  Potassium Latest Ref Range: 3.5 - 5.1 mEq/L 3.5  Chloride Latest Ref Range: 96 - 112 mEq/L 101  CO2 Latest Ref Range: 19 - 32 mEq/L 30  Glucose Latest Ref Range: 70 - 99 mg/dL 221 (H)  BUN Latest Ref Range: 6 - 23 mg/dL 10  Creatinine Latest Ref Range: 0.40 - 1.20 mg/dL 0.54  Calcium Latest Ref Range: 8.4 - 10.5 mg/dL 9.3  GFR Latest Ref Range: >60.00 mL/min 137.58    ASSESSMENT / PLAN / RECOMMENDATIONS:   1) Type2Diabetes  Mellitus,Poorlycontrolled, Withneuropathic complications - Most recent A1c of12.4%. Goal A1c <7.0%.    - With an A1c that high the patient is not taking most of medications. We have discussed worsening glycemic control, we discussed risk of microvascular complications.  - I have advised her to add a prandial insulin, but the patient would like to try and take medications as prescribed prior to making that decisions - We also discussed the importance of checking glucose at home and having the data available to me  - No changes will be made  today   MEDICATIONS: -  Lantus 50 units daily  - Continue Bydureon 2 mg weekly  - Continue Metformin 1000 mg twice a day  - Continue Invokana 300 mg daily   EDUCATION / INSTRUCTIONS:  BG monitoring instructions: Patient is instructed to check her blood sugars 2  times a day, fasting and bedtime .  Call [place] [facility] if: BG persistently < 70 or > 300. . I reviewed the Rule of 15 for the treatment of hypoglycemia in detail with the patient. Literature supplied.   F/U in 3 months    Signed electronically by: Lyndle Herrlich, MD  [facility]  [facility] [address] [place], [place] [address] Phone: [phone] FAX: [phone]   CC: Norval Gable, DO [address] [place] [address] Phone: [phone]  Fax: [phone]  Return to Endocrinology clinic as below: Future Appointments  Date Time Provider Department Center  08/07/2019  2:40 PM Aziz Slape, Konrad Dolores, MD [id number] None

## 2019-05-06 NOTE — Patient Instructions (Signed)
-    Lantus 50 units daily  - Continue Bydureon 2 mg weekly  - Continue Metformin 1000 mg twice a day  - Continue Invokana 300 mg daily   - check sugars twice a day (fasting and bedtime)   Choose healthy, lower carb lower calorie snacks: toss salad, cooked vegetables, cottage cheese, peanut butter, low fat cheese / string cheese, lower sodium deli meat, tuna salad or chicken salad    HOW TO TREAT LOW BLOOD SUGARS (Blood sugar LESS THAN 70 MG/DL)  Please follow the RULE OF 15 for the treatment of hypoglycemia treatment (when your (blood sugars are less than 70 mg/dL)    STEP 1: Take 15 grams of carbohydrates when your blood sugar is low, which includes:   3-4 GLUCOSE TABS  OR  3-4 OZ OF JUICE OR REGULAR SODA OR  ONE TUBE OF GLUCOSE GEL     STEP 2: RECHECK blood sugar in 15 MINUTES STEP 3: If your blood sugar is still low at the 15 minute recheck --> then, go back to STEP 1 and treat AGAIN with another 15 grams of carbohydrates.

## 2019-05-07 ENCOUNTER — Encounter: Payer: Self-pay | Admitting: Internal Medicine

## 2019-05-08 ENCOUNTER — Ambulatory Visit: Payer: Medicaid Other | Admitting: Internal Medicine

## 2019-06-12 ENCOUNTER — Other Ambulatory Visit: Payer: Self-pay | Admitting: Internal Medicine

## 2019-06-25 DIAGNOSIS — I10 Essential (primary) hypertension: Secondary | ICD-10-CM | POA: Diagnosis not present

## 2019-06-25 DIAGNOSIS — Z1159 Encounter for screening for other viral diseases: Secondary | ICD-10-CM | POA: Diagnosis not present

## 2019-06-25 DIAGNOSIS — E7800 Pure hypercholesterolemia, unspecified: Secondary | ICD-10-CM | POA: Diagnosis not present

## 2019-06-25 DIAGNOSIS — E1165 Type 2 diabetes mellitus with hyperglycemia: Secondary | ICD-10-CM | POA: Diagnosis not present

## 2019-06-25 DIAGNOSIS — E559 Vitamin D deficiency, unspecified: Secondary | ICD-10-CM | POA: Diagnosis not present

## 2019-07-18 ENCOUNTER — Telehealth: Payer: Self-pay | Admitting: Internal Medicine

## 2019-07-18 NOTE — Telephone Encounter (Signed)
Pt she has been on novolog for last few years,I read follow up instructions from OV on 05/06/19 to pt and informed her that she did not let us know that she was on novolog and that it can be discussed during next OV.

## 2019-07-18 NOTE — Telephone Encounter (Signed)
Patient called stating she would like to switch to Rybellsus rather than Lantus and Bydureon due to cost.  Pharmacy :   [id number] [place], [place] - [address] AT [facility] OF [address] Phone:  [phone]  Fax:  [phone]     Patient ph# [phone]

## 2019-07-18 NOTE — Telephone Encounter (Signed)
Please advise 

## 2019-07-26 DIAGNOSIS — N952 Postmenopausal atrophic vaginitis: Secondary | ICD-10-CM | POA: Diagnosis not present

## 2019-07-26 DIAGNOSIS — B373 Candidiasis of vulva and vagina: Secondary | ICD-10-CM | POA: Diagnosis not present

## 2019-07-26 DIAGNOSIS — Z124 Encounter for screening for malignant neoplasm of cervix: Secondary | ICD-10-CM | POA: Diagnosis not present

## 2019-08-07 ENCOUNTER — Other Ambulatory Visit: Payer: Self-pay

## 2019-08-07 ENCOUNTER — Ambulatory Visit: Payer: Medicaid Other | Admitting: Internal Medicine

## 2019-08-07 VITALS — BP 142/68 | HR 112 | Temp 97.5°F | Ht 65.0 in | Wt 237.6 lb

## 2019-08-07 DIAGNOSIS — E1165 Type 2 diabetes mellitus with hyperglycemia: Secondary | ICD-10-CM | POA: Diagnosis not present

## 2019-08-07 DIAGNOSIS — E1159 Type 2 diabetes mellitus with other circulatory complications: Secondary | ICD-10-CM | POA: Diagnosis not present

## 2019-08-07 DIAGNOSIS — E1142 Type 2 diabetes mellitus with diabetic polyneuropathy: Secondary | ICD-10-CM

## 2019-08-07 DIAGNOSIS — Z794 Long term (current) use of insulin: Secondary | ICD-10-CM | POA: Diagnosis not present

## 2019-08-07 LAB — POCT GLYCOSYLATED HEMOGLOBIN (HGB A1C): Hemoglobin A1C: 11.7 % — AB (ref 4.0–5.6)

## 2019-08-07 MED ORDER — NOVOLOG FLEXPEN 100 UNIT/ML ~~LOC~~ SOPN: 16.0000 [IU] | PEN_INJECTOR | Freq: Three times a day (TID) | SUBCUTANEOUS | 6 refills | Status: DC

## 2019-08-07 NOTE — Patient Instructions (Addendum)
-   Continue Lantus 50 units daily  - Continue Bydureon 2 mg weekly  - Continue Metformin 1000 mg twice a day  - Novolog 16 units with each meal     HOW TO TREAT LOW BLOOD SUGARS (Blood sugar LESS THAN 70 MG/DL)  Please follow the RULE OF 15 for the treatment of hypoglycemia treatment (when your (blood sugars are less than 70 mg/dL)    STEP 1: Take 15 grams of carbohydrates when your blood sugar is low, which includes:   3-4 GLUCOSE TABS  OR  3-4 OZ OF JUICE OR REGULAR SODA OR  ONE TUBE OF GLUCOSE GEL     STEP 2: RECHECK blood sugar in 15 MINUTES STEP 3: If your blood sugar is still low at the 15 minute recheck --> then, go back to STEP 1 and treat AGAIN with another 15 grams of carbohydrates.

## 2019-08-07 NOTE — Progress Notes (Signed)
Name: Whitney Sloan  Age/ Sex: 64 y.o., female   MRN/ DOB: [id number], 1955-08-02     PCP: Emelia Loron, NP   Reason for Endocrinology Evaluation: Type 2 Diabetes Mellitus  Initial Endocrine Consultative Visit: 09/10/2018    PATIENT IDENTIFIER: [name] is a 64 y.o. female with a past medical history of HTN, T2DM and Dyslipidemia. The patient has followed with Endocrinology clinic since 09/10/2018 for consultative assistance with management of her diabetes.  DIABETIC HISTORY:  Whitney Sloan was diagnosed with T2DM in 2008, she has been on Metformin, Glipizide, Januvia and Actos in the past.She has been on insulin therapy for years. Her hemoglobin A1c has ranged from 8.0%in 2011, peaking at12.5%in 2010.  On her initial visit to our clinic her A1c was 11.0% , she was on lantus, Invokana, Metfomin and Pioglitazone but she was not taking them. Bydureon was added.   Invokana was stopped by the patient by 06/2019 due to recurrent yeast infections  SUBJECTIVE:   During the last visit (05/06/2019): A1 c 12.4 % .  Patient was non adherent to her medication regimen, no changes were made she was encouraged to start with taking her medications    Today (08/07/2019): Whitney Sloan is here for a follow up on diabetes.She is accompanied by her daughter "Dietrich Pates"  today .  She has not checked her blood sugar since 05/2019 . The patient has not had hypoglycemic episodes since the last clinic visit. Otherwise, the patient has not required any recent emergency interventions for hypoglycemia and has not  had recent hospitalizations secondary to hyper or hypoglycemic episodes.  She also tells me that she has a prescription of NovoLog that she is supposed to take 20 units daily     ROS: As per HPI and as detailed below: Review of Systems  Constitutional: Negative for chills and fever.  HENT: Negative for congestion and sore throat.   Respiratory: Negative for cough and shortness of breath.    Cardiovascular: Negative for chest pain and palpitations.  Gastrointestinal: Negative for diarrhea and nausea.      HOME DIABETES REGIMEN:  Lantus 50 units daily  Metformin 1000 mg BID  Bydureon 2 mg weekly Invokana 300 mg daily - stopped due to yeats infection  Pt tells me she has a prescription  Novolog 20 units once daily at night     METER DOWNLOAD SUMMARY: No data since 05/2019    HISTORY:  Past Medical History:  Past Medical History:  Diagnosis Date  . Diabetes mellitus   . Hyperlipemia   . Hypertension   . Psoriasis    Past Surgical History:  Past Surgical History:  Procedure Laterality Date  . ANAL FISSURE REPAIR    . ENDOMETRIAL ABLATION     Social History:  reports that she has never smoked. She has never used smokeless tobacco. She reports that she does not drink alcohol or use drugs. Family History:  Family History  Problem Relation Age of Onset  . Dementia Mother   . Renal Disease Father   . Diabetes Other      HOME MEDICATIONS: Allergies as of 08/07/2019      Reactions   Oxycodone-acetaminophen Nausea And Vomiting      Medication List       Accurate as of August 07, 2019  2:57 PM. If you have any questions, ask your nurse or doctor.        amLODipine 10 MG tablet Commonly known as: NORVASC Take 1 tablet (  10 mg total) by mouth daily. For high blood pressure   Aspir-Low 81 MG EC tablet Generic drug: aspirin Take 81 mg by mouth every morning.   Bydureon 2 MG Pen Generic drug: Exenatide ER INJECT 2 MG INTO THE SKIN ONCE WEEKLY   hydrochlorothiazide 25 MG tablet Commonly known as: HYDRODIURIL Take 25 mg by mouth daily.   insulin glargine 100 UNIT/ML injection Commonly known as: LANTUS Inject 50 Units into the skin at bedtime.   Invokana 300 MG Tabs tablet Generic drug: canagliflozin 300 mg.   lisinopril 40 MG tablet Commonly known as: ZESTRIL Take 1 tablet (40 mg total) by mouth daily.   metFORMIN 1000 MG tablet Commonly  known as: GLUCOPHAGE Take 1 tablet (1,000 mg total) by mouth 2 (two) times daily.   nystatin cream Commonly known as: MYCOSTATIN Apply to affected area 2 times daily   nystatin powder Commonly known as: MYCOSTATIN/NYSTOP Apply topically 4 (four) times daily.   simvastatin 20 MG tablet Commonly known as: ZOCOR Take 1 tablet (20 mg total) by mouth at bedtime.   Unifine Pentips 31G X 6 MM Misc Generic drug: Insulin Pen Needle USE TO INJECT INSULIN   VITAMIN D PO Take 1 capsule by mouth daily.        OBJECTIVE:   Vital Signs: BP (!) 142/68 (BP Location: Right Arm, Patient Position: Sitting, Cuff Size: Large)   Pulse (!) 112   Temp (!) 97.5 F (36.4 C)   Ht 5\' 5"  (1.651 m)   Wt 237 lb 9.6 oz (107.8 kg)   SpO2 98%   BMI 39.54 kg/m   Wt Readings from Last 3 Encounters:  08/07/19 237 lb 9.6 oz (107.8 kg)  05/06/19 234 lb 6.4 oz (106.3 kg)  09/10/18 223 lb (101.2 kg)     Exam: General: Pt appears well and is in NAD  Lungs: Clear with good BS bilat with no rales, rhonchi, or wheezes  Heart: RRR with normal S1 and S2 and no gallops; no murmurs; no rub  Extremities: No pretibial edema.  Skin: Normal texture and temperature to palpation  Neuro: MS is good with appropriate affect, pt is alert and Ox3    DM Foot Exam 05/06/2019 The skin of the feet is intact without sores or ulcerations. The pedal pulses are decreased The sensation is decreased to a screening 5.07, 10 gram monofilament bilaterally     In-Office 205  DATA REVIEWED:  Lab Results  Component Value Date   HGBA1C 12.4 (A) 05/06/2019   HGBA1C 11.0 (A) 09/10/2018   HGBA1C 13.8 03/23/2010        Results for MARQUISA, SALIH (MRN Marline Backbone) as of 05/06/2019 16:20  Ref. Range 05/06/2019 14:30  Sodium Latest Ref Range: 135 - 145 mEq/L 139  Potassium Latest Ref Range: 3.5 - 5.1 mEq/L 3.5  Chloride Latest Ref Range: 96 - 112 mEq/L 101  CO2 Latest Ref Range: 19 - 32 mEq/L 30  Glucose Latest Ref Range:  70 - 99 mg/dL 05/08/2019 (H)  BUN Latest Ref Range: 6 - 23 mg/dL 10  Creatinine Latest Ref Range: 0.40 - 1.20 mg/dL 841  Calcium Latest Ref Range: 8.4 - 10.5 mg/dL 9.3  GFR Latest Ref Range: >60.00 mL/min 137.58    ASSESSMENT / PLAN / RECOMMENDATIONS:   1) Type2Diabetes Mellitus,Poorlycontrolled, Withneuropathic complications - Most recent A1c of11.7%. Goal A1c <7.0%.    -Poorly controlled diabetes due to medication nonadherence and dietary indiscretions -I have encouraged the daughter to remind the patient to take  her medications, daughter admits that patient has dietary indiscretions mainly at night. -Since she had stopped the Invokana we will give her a set dose of NovoLog, apparently the patient had a prescription of NovoLog that I am not aware of. -Patient is interested in trying Rybelsus, I did explain to her that Rybelsus is not on the formulary for Medicaid, we did discuss Victoza as an alternative to Bydureon but she will have to take Victoza daily versus the Bydureon that she takes weekly.  Patient opted to continue with the Bydureon at this time.    MEDICATIONS: -Continue Lantus 50 units daily  - Continue Bydureon 2 mg weekly  - Continue Metformin 1000 mg twice a day  -NovoLog 16 units with each meal  EDUCATION / INSTRUCTIONS: BG monitoring instructions: Patient is instructed to check her blood sugars 3 times a day, before meals Call [facility] if: BG persistently < 70 or > 300. I reviewed the Rule of 15 for the treatment of hypoglycemia in detail with the patient. Literature supplied.   F/U in 3 months    Signed electronically by: Lyndle Herrlich, MD  [facility] Endocrinology  [facility] [address] [place], [place] [address] Phone: [phone] FAX: [phone]   CC: Carmel Sacramento, NP [address] [place] [address] Phone: [phone]  Fax: [phone]  Return to Endocrinology clinic as  below: No future appointments.

## 2019-08-08 ENCOUNTER — Encounter: Payer: Self-pay | Admitting: Internal Medicine

## 2019-08-08 DIAGNOSIS — E1142 Type 2 diabetes mellitus with diabetic polyneuropathy: Secondary | ICD-10-CM | POA: Insufficient documentation

## 2019-08-08 DIAGNOSIS — E119 Type 2 diabetes mellitus without complications: Secondary | ICD-10-CM | POA: Insufficient documentation

## 2019-08-08 DIAGNOSIS — Z794 Long term (current) use of insulin: Secondary | ICD-10-CM | POA: Insufficient documentation

## 2019-09-20 DIAGNOSIS — M109 Gout, unspecified: Secondary | ICD-10-CM | POA: Diagnosis not present

## 2019-09-20 DIAGNOSIS — B373 Candidiasis of vulva and vagina: Secondary | ICD-10-CM | POA: Diagnosis not present

## 2019-09-20 DIAGNOSIS — Z79899 Other long term (current) drug therapy: Secondary | ICD-10-CM | POA: Diagnosis not present

## 2019-09-20 DIAGNOSIS — M129 Arthropathy, unspecified: Secondary | ICD-10-CM | POA: Diagnosis not present

## 2019-09-20 DIAGNOSIS — I10 Essential (primary) hypertension: Secondary | ICD-10-CM | POA: Diagnosis not present

## 2019-09-20 DIAGNOSIS — Z760 Encounter for issue of repeat prescription: Secondary | ICD-10-CM | POA: Diagnosis not present

## 2019-09-20 DIAGNOSIS — Z20822 Contact with and (suspected) exposure to covid-19: Secondary | ICD-10-CM | POA: Diagnosis not present

## 2019-09-23 ENCOUNTER — Other Ambulatory Visit: Payer: Self-pay

## 2019-09-23 ENCOUNTER — Telehealth: Payer: Self-pay | Admitting: Internal Medicine

## 2019-09-23 MED ORDER — BYDUREON 2 MG ~~LOC~~ PEN: PEN_INJECTOR | SUBCUTANEOUS | 6 refills | Status: DC

## 2019-09-23 MED ORDER — OZEMPIC (0.25 OR 0.5 MG/DOSE) 2 MG/1.5ML ~~LOC~~ SOPN: 0.5000 mg | PEN_INJECTOR | SUBCUTANEOUS | 3 refills | Status: DC

## 2019-09-23 NOTE — Telephone Encounter (Signed)
Bydureon is not longer available according to pharmacy, please advise.

## 2019-09-23 NOTE — Telephone Encounter (Signed)
Ozempic ordered and pt informed

## 2019-09-23 NOTE — Telephone Encounter (Signed)
Patient requests to be called at ph# [phone] re: Patient is unable to get BYDUREON 2 MG PEN. Pharmacy told patient to call Dr. Lonzo Cloud to let her know that patient will need to have a substitute medication because PHARM is unable to get the medication listed above.

## 2019-09-23 NOTE — Telephone Encounter (Signed)
Asked pt if insurance was no longer covering it or if the pharmacy was out of stock, pt stated that pharmacy was out of stock so I added a new pharmacy to pt chart and sent rx to that pharmacy.

## 2019-09-25 ENCOUNTER — Other Ambulatory Visit: Payer: Self-pay

## 2019-09-26 ENCOUNTER — Other Ambulatory Visit: Payer: Self-pay

## 2019-09-26 ENCOUNTER — Telehealth: Payer: Self-pay

## 2019-09-26 NOTE — Telephone Encounter (Signed)
Patient calling today requesting Lantus be filled I did see this on her list by historical provider and note in last chart note states 50 mg at bed time-patient also had questions about Ozempic-patient has not started using Ozempic yet but wants to know if it is going to be gradually increased and if her stomach is the best spot to inject-I advised she can use fatty area of stomach rotate sites and allow 6-10 seconds with needle in for best absorption-please contact her to discuss further

## 2019-09-26 NOTE — Telephone Encounter (Signed)
Attempted to reach pt no answer lft vm informing she could call back if she had any further questions.

## 2019-09-30 NOTE — Telephone Encounter (Signed)
Patient called back requesting to speak w nurse. [phone]

## 2019-10-01 ENCOUNTER — Other Ambulatory Visit: Payer: Self-pay

## 2019-10-01 MED ORDER — INSULIN GLARGINE 100 UNIT/ML ~~LOC~~ SOLN: 50.0000 [IU] | Freq: Every day | SUBCUTANEOUS | 3 refills | Status: DC

## 2019-10-01 NOTE — Telephone Encounter (Signed)
Patient returned called regarding lantus, Ozempic,

## 2019-10-01 NOTE — Telephone Encounter (Signed)
Lft vm to return call 

## 2019-10-01 NOTE — Telephone Encounter (Signed)
Spoke to pt and she wanted a refill for lantus which was sent and to know what else can she be put on besides the ozempic because she stated that she read about the side effects.

## 2019-10-02 MED ORDER — VICTOZA 18 MG/3ML ~~LOC~~ SOPN: 1.8000 mg | PEN_INJECTOR | Freq: Every day | SUBCUTANEOUS | 11 refills | Status: DC

## 2019-10-02 NOTE — Telephone Encounter (Signed)
Pt stated that she will try victoza

## 2019-10-02 NOTE — Addendum Note (Signed)
Addended by: Scarlette Shorts on: 10/02/2019 03:47 PM   Modules accepted: Orders

## 2019-10-02 NOTE — Telephone Encounter (Signed)
Lf vm requesting call back to inform pt of this

## 2019-10-02 NOTE — Telephone Encounter (Signed)
Sent. Let her know this is daily injections. Pen is used until finished usually lasts for 10 days

## 2019-10-16 DIAGNOSIS — E119 Type 2 diabetes mellitus without complications: Secondary | ICD-10-CM | POA: Diagnosis not present

## 2019-10-28 ENCOUNTER — Telehealth: Payer: Self-pay | Admitting: Internal Medicine

## 2019-10-28 ENCOUNTER — Other Ambulatory Visit: Payer: Self-pay

## 2019-10-28 NOTE — Telephone Encounter (Signed)
Lft vm to return call, also d/c'ed ozempic and victoza

## 2019-10-28 NOTE — Telephone Encounter (Signed)
Victoza was ordered on 10/02/19, pt also on novolog, lantus,metformin and ozempic. Please advise.

## 2019-10-28 NOTE — Telephone Encounter (Signed)
Per patient since starting Victoza she has been experiencing shortness of breath (unable to walk about her home without becoming winded) and she is unable to sleep at all.  Patient started medication on 10/21/2019.    Would like a call back to [phone]

## 2019-10-28 NOTE — Telephone Encounter (Signed)
Patient on phone now returning your call from earlier.

## 2019-10-29 NOTE — Telephone Encounter (Signed)
Pt informed of doctor's advice

## 2019-10-29 NOTE — Telephone Encounter (Signed)
Attempted to reach pt , lft another vm

## 2019-11-01 DIAGNOSIS — M7989 Other specified soft tissue disorders: Secondary | ICD-10-CM | POA: Diagnosis not present

## 2019-11-01 DIAGNOSIS — I509 Heart failure, unspecified: Secondary | ICD-10-CM | POA: Diagnosis not present

## 2019-11-01 DIAGNOSIS — R0602 Shortness of breath: Secondary | ICD-10-CM | POA: Diagnosis not present

## 2019-11-08 ENCOUNTER — Inpatient Hospital Stay (HOSPITAL_COMMUNITY)
Admission: EM | Admit: 2019-11-08 | Discharge: 2019-11-15 | DRG: 291 | Disposition: A | Discharge status: Home Health | Payer: Medicaid Other | Attending: Internal Medicine | Admitting: Internal Medicine

## 2019-11-08 ENCOUNTER — Encounter (HOSPITAL_COMMUNITY): Payer: Self-pay | Admitting: Emergency Medicine

## 2019-11-08 ENCOUNTER — Inpatient Hospital Stay (HOSPITAL_COMMUNITY): Payer: Medicaid Other

## 2019-11-08 ENCOUNTER — Emergency Department (HOSPITAL_COMMUNITY): Payer: Medicaid Other

## 2019-11-08 ENCOUNTER — Other Ambulatory Visit: Payer: Self-pay

## 2019-11-08 DIAGNOSIS — I161 Hypertensive emergency: Secondary | ICD-10-CM

## 2019-11-08 DIAGNOSIS — G4700 Insomnia, unspecified: Secondary | ICD-10-CM | POA: Diagnosis present

## 2019-11-08 DIAGNOSIS — L84 Corns and callosities: Secondary | ICD-10-CM | POA: Diagnosis present

## 2019-11-08 DIAGNOSIS — I5031 Acute diastolic (congestive) heart failure: Secondary | ICD-10-CM | POA: Diagnosis not present

## 2019-11-08 DIAGNOSIS — I152 Hypertension secondary to endocrine disorders: Secondary | ICD-10-CM

## 2019-11-08 DIAGNOSIS — Z20822 Contact with and (suspected) exposure to covid-19: Secondary | ICD-10-CM | POA: Diagnosis present

## 2019-11-08 DIAGNOSIS — R269 Unspecified abnormalities of gait and mobility: Secondary | ICD-10-CM

## 2019-11-08 DIAGNOSIS — K5900 Constipation, unspecified: Secondary | ICD-10-CM

## 2019-11-08 DIAGNOSIS — I5032 Chronic diastolic (congestive) heart failure: Secondary | ICD-10-CM

## 2019-11-08 DIAGNOSIS — W3400XA Accidental discharge from unspecified firearms or gun, initial encounter: Secondary | ICD-10-CM

## 2019-11-08 DIAGNOSIS — Z7982 Long term (current) use of aspirin: Secondary | ICD-10-CM

## 2019-11-08 DIAGNOSIS — Z6839 Body mass index (BMI) 39.0-39.9, adult: Secondary | ICD-10-CM | POA: Diagnosis not present

## 2019-11-08 DIAGNOSIS — I509 Heart failure, unspecified: Secondary | ICD-10-CM

## 2019-11-08 DIAGNOSIS — I959 Hypotension, unspecified: Secondary | ICD-10-CM | POA: Diagnosis present

## 2019-11-08 DIAGNOSIS — R109 Unspecified abdominal pain: Secondary | ICD-10-CM | POA: Diagnosis not present

## 2019-11-08 DIAGNOSIS — Z794 Long term (current) use of insulin: Secondary | ICD-10-CM | POA: Diagnosis not present

## 2019-11-08 DIAGNOSIS — I169 Hypertensive crisis, unspecified: Secondary | ICD-10-CM | POA: Diagnosis not present

## 2019-11-08 DIAGNOSIS — R0602 Shortness of breath: Secondary | ICD-10-CM | POA: Diagnosis not present

## 2019-11-08 DIAGNOSIS — R339 Retention of urine, unspecified: Secondary | ICD-10-CM | POA: Diagnosis present

## 2019-11-08 DIAGNOSIS — E785 Hyperlipidemia, unspecified: Secondary | ICD-10-CM | POA: Diagnosis present

## 2019-11-08 DIAGNOSIS — R000 Tachycardia, unspecified: Secondary | ICD-10-CM | POA: Diagnosis not present

## 2019-11-08 DIAGNOSIS — Z841 Family history of disorders of kidney and ureter: Secondary | ICD-10-CM

## 2019-11-08 DIAGNOSIS — Z79899 Other long term (current) drug therapy: Secondary | ICD-10-CM | POA: Diagnosis not present

## 2019-11-08 DIAGNOSIS — Z885 Allergy status to narcotic agent status: Secondary | ICD-10-CM | POA: Diagnosis not present

## 2019-11-08 DIAGNOSIS — I5021 Acute systolic (congestive) heart failure: Secondary | ICD-10-CM | POA: Diagnosis not present

## 2019-11-08 DIAGNOSIS — R509 Fever, unspecified: Secondary | ICD-10-CM | POA: Diagnosis not present

## 2019-11-08 DIAGNOSIS — I5043 Acute on chronic combined systolic (congestive) and diastolic (congestive) heart failure: Secondary | ICD-10-CM | POA: Diagnosis present

## 2019-11-08 DIAGNOSIS — E1165 Type 2 diabetes mellitus with hyperglycemia: Secondary | ICD-10-CM | POA: Diagnosis not present

## 2019-11-08 DIAGNOSIS — I501 Left ventricular failure: Secondary | ICD-10-CM | POA: Diagnosis not present

## 2019-11-08 DIAGNOSIS — E782 Mixed hyperlipidemia: Secondary | ICD-10-CM

## 2019-11-08 DIAGNOSIS — I10 Essential (primary) hypertension: Secondary | ICD-10-CM | POA: Diagnosis not present

## 2019-11-08 DIAGNOSIS — J9601 Acute respiratory failure with hypoxia: Secondary | ICD-10-CM

## 2019-11-08 DIAGNOSIS — G4733 Obstructive sleep apnea (adult) (pediatric): Secondary | ICD-10-CM | POA: Diagnosis present

## 2019-11-08 DIAGNOSIS — I69393 Ataxia following cerebral infarction: Secondary | ICD-10-CM

## 2019-11-08 DIAGNOSIS — I160 Hypertensive urgency: Secondary | ICD-10-CM | POA: Diagnosis present

## 2019-11-08 DIAGNOSIS — M7989 Other specified soft tissue disorders: Secondary | ICD-10-CM | POA: Diagnosis not present

## 2019-11-08 DIAGNOSIS — J80 Acute respiratory distress syndrome: Secondary | ICD-10-CM | POA: Diagnosis not present

## 2019-11-08 DIAGNOSIS — Z833 Family history of diabetes mellitus: Secondary | ICD-10-CM | POA: Diagnosis not present

## 2019-11-08 DIAGNOSIS — J810 Acute pulmonary edema: Secondary | ICD-10-CM | POA: Diagnosis not present

## 2019-11-08 DIAGNOSIS — R0689 Other abnormalities of breathing: Secondary | ICD-10-CM | POA: Diagnosis not present

## 2019-11-08 DIAGNOSIS — I110 Hypertensive heart disease with heart failure: Secondary | ICD-10-CM | POA: Diagnosis present

## 2019-11-08 DIAGNOSIS — E1169 Type 2 diabetes mellitus with other specified complication: Secondary | ICD-10-CM | POA: Diagnosis present

## 2019-11-08 DIAGNOSIS — R0902 Hypoxemia: Secondary | ICD-10-CM | POA: Diagnosis not present

## 2019-11-08 DIAGNOSIS — I5033 Acute on chronic diastolic (congestive) heart failure: Secondary | ICD-10-CM | POA: Diagnosis present

## 2019-11-08 LAB — COMPREHENSIVE METABOLIC PANEL
ALT: 24 U/L (ref 0–44)
AST: 39 U/L (ref 15–41)
Albumin: 3.4 g/dL — ABNORMAL LOW (ref 3.5–5.0)
Alkaline Phosphatase: 77 U/L (ref 38–126)
Anion gap: 15 (ref 5–15)
BUN: 13 mg/dL (ref 8–23)
CO2: 25 mmol/L (ref 22–32)
Calcium: 8.9 mg/dL (ref 8.9–10.3)
Chloride: 100 mmol/L (ref 98–111)
Creatinine, Ser: 0.88 mg/dL (ref 0.44–1.00)
GFR calc Af Amer: >60 mL/min (ref >60)
GFR calc non Af Amer: >60 mL/min (ref >60)
Glucose, Bld: 324 mg/dL — ABNORMAL HIGH (ref 70–99)
Potassium: 3.7 mmol/L (ref 3.5–5.1)
Sodium: 140 mmol/L (ref 135–145)
Total Bilirubin: 1 mg/dL (ref 0.3–1.2)
Total Protein: 7.7 g/dL (ref 6.5–8.1)

## 2019-11-08 LAB — POCT I-STAT 7, (LYTES, BLD GAS, ICA,H+H)
Acid-Base Excess: 2 mmol/L (ref 0.0–2.0)
Bicarbonate: 27.2 mmol/L (ref 20.0–28.0)
Calcium, Ion: 1.2 mmol/L (ref 1.15–1.40)
HCT: 39 % (ref 36.0–46.0)
Hemoglobin: 13.3 g/dL (ref 12.0–15.0)
O2 Saturation: 92 %
Patient temperature: 98.8
Potassium: 3.8 mmol/L (ref 3.5–5.1)
Sodium: 140 mmol/L (ref 135–145)
TCO2: 29 mmol/L (ref 22–32)
pCO2 arterial: 44.2 mmHg (ref 32.0–48.0)
pH, Arterial: 7.398 (ref 7.350–7.450)
pO2, Arterial: 64 mmHg — ABNORMAL LOW (ref 83.0–108.0)

## 2019-11-08 LAB — C-REACTIVE PROTEIN: CRP: <0.5 mg/dL (ref <1.0)

## 2019-11-08 LAB — CBC WITH DIFFERENTIAL/PLATELET
Abs Immature Granulocytes: 0.06 10*3/uL (ref 0.00–0.07)
Basophils Absolute: 0 10*3/uL (ref 0.0–0.1)
Basophils Relative: 0 %
Eosinophils Absolute: 0.1 10*3/uL (ref 0.0–0.5)
Eosinophils Relative: 1 %
HCT: 43.1 % (ref 36.0–46.0)
Hemoglobin: 13.3 g/dL (ref 12.0–15.0)
Immature Granulocytes: 0 %
Lymphocytes Relative: 48 %
Lymphs Abs: 6.9 10*3/uL — ABNORMAL HIGH (ref 0.7–4.0)
MCH: 27.1 pg (ref 26.0–34.0)
MCHC: 30.9 g/dL (ref 30.0–36.0)
MCV: 87.8 fL (ref 80.0–100.0)
Monocytes Absolute: 1.2 10*3/uL — ABNORMAL HIGH (ref 0.1–1.0)
Monocytes Relative: 8 %
Neutro Abs: 6.2 10*3/uL (ref 1.7–7.7)
Neutrophils Relative %: 43 %
Platelets: 338 10*3/uL (ref 150–400)
RBC: 4.91 MIL/uL (ref 3.87–5.11)
RDW: 14.9 % (ref 11.5–15.5)
WBC: 14.4 10*3/uL — ABNORMAL HIGH (ref 4.0–10.5)
nRBC: 0 % (ref 0.0–0.2)

## 2019-11-08 LAB — SARS CORONAVIRUS 2 BY RT PCR (HOSPITAL ORDER, PERFORMED IN ~~LOC~~ HOSPITAL LAB): SARS Coronavirus 2: NEGATIVE

## 2019-11-08 LAB — URINALYSIS, COMPLETE (UACMP) WITH MICROSCOPIC
Bacteria, UA: NONE SEEN
Bilirubin Urine: NEGATIVE
Glucose, UA: >=500 mg/dL — AB
Ketones, ur: NEGATIVE mg/dL
Leukocytes,Ua: NEGATIVE
Nitrite: NEGATIVE
Protein, ur: >=300 mg/dL — AB
Specific Gravity, Urine: 1.01 (ref 1.005–1.030)
pH: 5 (ref 5.0–8.0)

## 2019-11-08 LAB — ECHOCARDIOGRAM COMPLETE: Height: 65 in

## 2019-11-08 LAB — GLUCOSE, CAPILLARY
Glucose-Capillary: 364 mg/dL — ABNORMAL HIGH (ref 70–99)
Glucose-Capillary: 237 mg/dL — ABNORMAL HIGH (ref 70–99)

## 2019-11-08 LAB — BRAIN NATRIURETIC PEPTIDE: B Natriuretic Peptide: 132.6 pg/mL — ABNORMAL HIGH (ref 0.0–100.0)

## 2019-11-08 LAB — HEMOGLOBIN A1C
Hgb A1c MFr Bld: 10.1 % — ABNORMAL HIGH (ref 4.8–5.6)
Mean Plasma Glucose: 243.17 mg/dL

## 2019-11-08 LAB — CBG MONITORING, ED
Glucose-Capillary: 314 mg/dL — ABNORMAL HIGH (ref 70–99)
Glucose-Capillary: 315 mg/dL — ABNORMAL HIGH (ref 70–99)

## 2019-11-08 LAB — TROPONIN I (HIGH SENSITIVITY)
Troponin I (High Sensitivity): 19 ng/L — ABNORMAL HIGH (ref <18)
Troponin I (High Sensitivity): 34 ng/L — ABNORMAL HIGH (ref <18)

## 2019-11-08 MED ORDER — ENOXAPARIN SODIUM 40 MG/0.4ML ~~LOC~~ SOLN
40.0000 mg | SUBCUTANEOUS | Status: DC
  Administered 2019-11-08 – 2019-11-14 (×7): 40 mg via SUBCUTANEOUS
  Filled 2019-11-08 (×8): qty 0.4

## 2019-11-08 MED ORDER — HYDRALAZINE HCL 20 MG/ML IJ SOLN: 10.0000 mg | Freq: Four times a day (QID) | INTRAMUSCULAR | Status: DC | PRN

## 2019-11-08 MED ORDER — ACETAMINOPHEN 325 MG PO TABS
650.0000 mg | ORAL_TABLET | ORAL | Status: DC | PRN
  Administered 2019-11-11 – 2019-11-14 (×2): 650 mg via ORAL
  Filled 2019-11-08 (×2): qty 2

## 2019-11-08 MED ORDER — AMLODIPINE BESYLATE 10 MG PO TABS
10.0000 mg | ORAL_TABLET | Freq: Every day | ORAL | Status: DC
  Administered 2019-11-08 – 2019-11-15 (×8): 10 mg via ORAL
  Filled 2019-11-08 (×9): qty 1

## 2019-11-08 MED ORDER — LISINOPRIL 40 MG PO TABS
40.0000 mg | ORAL_TABLET | Freq: Every day | ORAL | Status: DC
  Administered 2019-11-09 – 2019-11-15 (×7): 40 mg via ORAL
  Filled 2019-11-08 (×7): qty 1

## 2019-11-08 MED ORDER — ALBUTEROL SULFATE (2.5 MG/3ML) 0.083% IN NEBU: 2.5000 mg | INHALATION_SOLUTION | RESPIRATORY_TRACT | Status: DC | PRN

## 2019-11-08 MED ORDER — CHLORHEXIDINE GLUCONATE CLOTH 2 % EX PADS
6.0000 | MEDICATED_PAD | Freq: Every day | CUTANEOUS | Status: DC
  Administered 2019-11-09 – 2019-11-15 (×5): 6 via TOPICAL

## 2019-11-08 MED ORDER — SIMVASTATIN 20 MG PO TABS
20.0000 mg | ORAL_TABLET | Freq: Every day | ORAL | Status: DC
  Administered 2019-11-08 – 2019-11-12 (×5): 20 mg via ORAL
  Filled 2019-11-08 (×5): qty 1

## 2019-11-08 MED ORDER — SODIUM CHLORIDE 0.9 % IV SOLN: 250.0000 mL | INTRAVENOUS | Status: DC | PRN

## 2019-11-08 MED ORDER — ASPIRIN EC 81 MG PO TBEC
81.0000 mg | DELAYED_RELEASE_TABLET | Freq: Every day | ORAL | Status: DC
  Administered 2019-11-08 – 2019-11-15 (×8): 81 mg via ORAL
  Filled 2019-11-08 (×8): qty 1

## 2019-11-08 MED ORDER — FUROSEMIDE 10 MG/ML IJ SOLN
80.0000 mg | Freq: Once | INTRAMUSCULAR | Status: AC
  Administered 2019-11-08: 80 mg via INTRAVENOUS
  Filled 2019-11-08: qty 8

## 2019-11-08 MED ORDER — ONDANSETRON HCL 4 MG/2ML IJ SOLN: 4.0000 mg | Freq: Four times a day (QID) | INTRAMUSCULAR | Status: DC | PRN

## 2019-11-08 MED ORDER — INSULIN ASPART 100 UNIT/ML ~~LOC~~ SOLN
16.0000 [IU] | Freq: Three times a day (TID) | SUBCUTANEOUS | Status: DC
  Administered 2019-11-09 – 2019-11-15 (×18): 16 [IU] via SUBCUTANEOUS

## 2019-11-08 MED ORDER — SODIUM CHLORIDE 0.9% FLUSH: 3.0000 mL | INTRAVENOUS | Status: DC | PRN

## 2019-11-08 MED ORDER — INSULIN ASPART 100 UNIT/ML ~~LOC~~ SOLN
0.0000 [IU] | Freq: Three times a day (TID) | SUBCUTANEOUS | Status: DC
  Administered 2019-11-08: 15 [IU] via SUBCUTANEOUS
  Administered 2019-11-08: 7 [IU] via SUBCUTANEOUS
  Administered 2019-11-08: 20 [IU] via SUBCUTANEOUS
  Administered 2019-11-09 (×3): 3 [IU] via SUBCUTANEOUS
  Administered 2019-11-09: 4 [IU] via SUBCUTANEOUS
  Administered 2019-11-10 (×4): 3 [IU] via SUBCUTANEOUS
  Administered 2019-11-11 (×3): 4 [IU] via SUBCUTANEOUS
  Administered 2019-11-12: 3 [IU] via SUBCUTANEOUS
  Administered 2019-11-12 – 2019-11-13 (×4): 4 [IU] via SUBCUTANEOUS
  Administered 2019-11-13 (×3): 3 [IU] via SUBCUTANEOUS
  Administered 2019-11-14: 4 [IU] via SUBCUTANEOUS
  Administered 2019-11-14: 3 [IU] via SUBCUTANEOUS
  Administered 2019-11-14: 4 [IU] via SUBCUTANEOUS
  Administered 2019-11-15: 11 [IU] via SUBCUTANEOUS
  Administered 2019-11-15: 4 [IU] via SUBCUTANEOUS

## 2019-11-08 MED ORDER — NITROGLYCERIN IN D5W 200-5 MCG/ML-% IV SOLN
0.0000 ug/min | INTRAVENOUS | Status: DC
  Administered 2019-11-08: 20 ug/min via INTRAVENOUS
  Filled 2019-11-08: qty 250

## 2019-11-08 MED ORDER — SODIUM CHLORIDE 0.9% FLUSH
3.0000 mL | Freq: Two times a day (BID) | INTRAVENOUS | Status: DC
  Administered 2019-11-08 – 2019-11-15 (×14): 3 mL via INTRAVENOUS

## 2019-11-08 MED ORDER — FUROSEMIDE 10 MG/ML IJ SOLN
40.0000 mg | Freq: Two times a day (BID) | INTRAMUSCULAR | Status: DC
  Administered 2019-11-08 – 2019-11-11 (×7): 40 mg via INTRAVENOUS
  Filled 2019-11-08 (×7): qty 4

## 2019-11-08 MED ORDER — INSULIN GLARGINE 100 UNIT/ML ~~LOC~~ SOLN
50.0000 [IU] | Freq: Every day | SUBCUTANEOUS | Status: DC
  Administered 2019-11-08 – 2019-11-15 (×8): 50 [IU] via SUBCUTANEOUS
  Filled 2019-11-08 (×8): qty 0.5

## 2019-11-08 NOTE — ED Notes (Signed)
Pt was unable to void, unable to locate bladder scanner, in and out cath done on pt, of urine obtained.

## 2019-11-08 NOTE — ED Notes (Signed)
2d echo at bedside, breakfast tray ordered- will cover with insulin once it arrives

## 2019-11-08 NOTE — ED Triage Notes (Signed)
Patient is from home, found in resp distress with oxygen sats of 83% on RA.  Patient placed on CPAP by EMS.  Patient with diminished BS, crackles throughout.  Patient denies any pain or nausea/vomiting.  ST on monitor.

## 2019-11-08 NOTE — ED Provider Notes (Signed)
[facility] EMERGENCY DEPARTMENT Provider Note   CSN: [id number] Arrival date & time: 11/08/19  0106     History Chief Complaint  Patient presents with  . Respiratory Distress   Level 5 caveat due to acuity of condition Whitney Sloan is a 64 y.o. female.  The history is provided by the EMS personnel. The history is limited by the condition of the patient.  Patient presents with respiratory distress.  History provided by EMS.  EMS was called due to shortness of breath.  On their arrival patient was then distress and was hypoxic.  Patient was placed on CPAP with some improvement.  Patient denied any chest pain.  No other details are known on arrival.     Past Medical History:  Diagnosis Date  . Diabetes mellitus   . Hyperlipemia   . Hypertension   . Psoriasis     Patient Active Problem List   Diagnosis Date Noted  . Type 2 diabetes mellitus with diabetic polyneuropathy, with long-term current use of insulin (HCC) 08/08/2019  . Diabetes mellitus (HCC) 08/08/2019  . HYPERLIPIDEMIA 03/23/2010  . DECREASED HEARING, BILATERAL 02/05/2010  . MEMORY LOSS 02/05/2010  . INCONTINENCE 02/05/2010  . NEOPLASM, MALIGNANT, BREAST, HX OF 01/13/2010  . ANXIETY STATE, UNSPECIFIED 12/02/2009  . UNSTEADY GAIT 12/02/2009  . TOE PAIN 09/24/2009  . ANAL OR RECTAL PAIN 08/25/2009  . LEG EDEMA, BILATERAL 08/25/2009  . Type 2 diabetes mellitus with hyperglycemia, with long-term current use of insulin (HCC) 08/06/2009  . ESSENTIAL HYPERTENSION, BENIGN 08/06/2009  . DERMATITIS, ATOPIC 08/06/2009    Past Surgical History:  Procedure Laterality Date  . ANAL FISSURE REPAIR    . ENDOMETRIAL ABLATION       OB History   No obstetric history on file.     Family History  Problem Relation Age of Onset  . Dementia Mother   . Renal Disease Father   . Diabetes Other     Social History   Tobacco Use  . Smoking status: Never Smoker  . Smokeless tobacco: Never Used    Substance Use Topics  . Alcohol use: No  . Drug use: No    Home Medications Prior to Admission medications   Medication Sig Start Date End Date Taking? Authorizing Provider  amLODipine (NORVASC) 10 MG tablet Take 1 tablet (10 mg total) by mouth daily. For high blood pressure 05/30/12   Andrena Mews, DO  aspirin (ASPIR-LOW) 81 MG EC tablet Take 81 mg by mouth every morning.     [provider]  Cholecalciferol (VITAMIN D PO) Take 1 capsule by mouth daily.    [provider]  hydrochlorothiazide (HYDRODIURIL) 25 MG tablet Take 25 mg by mouth daily.  03/21/11   Edd Arbour, MD  insulin aspart (NOVOLOG FLEXPEN) 100 UNIT/ML FlexPen Inject 16 Units into the skin 3 (three) times daily with meals. 08/07/19   Shamleffer, Konrad Dolores, MD  insulin glargine (LANTUS) 100 UNIT/ML injection Inject 0.5 mLs (50 Units total) into the skin at bedtime. E11.65 10/01/19   Shamleffer, Konrad Dolores, MD  lisinopril (PRINIVIL,ZESTRIL) 40 MG tablet Take 1 tablet (40 mg total) by mouth daily. 04/02/12   Andrena Mews, DO  metFORMIN (GLUCOPHAGE) 1000 MG tablet Take 1 tablet (1,000 mg total) by mouth 2 (two) times daily. 05/30/12   Andrena Mews, DO  nystatin (MYCOSTATIN/NYSTOP) powder Apply topically 4 (four) times daily. 07/22/16   Deatra Canter, FNP  nystatin cream (MYCOSTATIN) Apply to affected area 2  times daily 07/22/16   Deatra Canter, FNP  simvastatin (ZOCOR) 20 MG tablet Take 1 tablet (20 mg total) by mouth at bedtime. 05/30/12   Andrena Mews, DO  UNIFINE PENTIPS 31G X 6 MM MISC USE TO INJECT INSULIN 03/21/11   Edd Arbour, MD    Allergies    Oxycodone-acetaminophen  Review of Systems   Review of Systems  Unable to perform ROS: Acuity of condition    Physical Exam Updated Vital Signs BP (!) 188/106 (BP Location: Right Arm)   Pulse (!) 133   Temp 98.8 F (37.1 C) (Temporal)   Resp (!) 36   Ht 1.651 m (5\' 5" )   SpO2 94%   BMI 39.54 kg/m   Physical  Exam CONSTITUTIONAL: Ill-appearing, respiratory distress noted HEAD: Normocephalic/atraumatic EYES: EOMI/PERRL ENMT: Mucous membranes moist, CPAP mask in place NECK: supple no meningeal signs SPINE/BACK:entire spine nontender CV: Tachycardic LUNGS: Respiratory distress noted, crackles bilaterally, tachypneic ABDOMEN: soft, obese NEURO: Pt is awake/alert/appropriate, moves all extremitiesx4.  No facial droop.  EXTREMITIES: pulses normal/equal, full ROM, bilateral lower extremity edema noted SKIN: warm, color normal PSYCH: Anxious  ED Results / Procedures / Treatments   Labs (all labs ordered are listed, but only abnormal results are displayed) Labs Reviewed  CBC WITH DIFFERENTIAL/PLATELET - Abnormal; Notable for the following components:      Result Value   WBC 14.4 (*)    Lymphs Abs 6.9 (*)    Monocytes Absolute 1.2 (*)    All other components within normal limits  BRAIN NATRIURETIC PEPTIDE - Abnormal; Notable for the following components:   B Natriuretic Peptide 132.6 (*)    All other components within normal limits  COMPREHENSIVE METABOLIC PANEL - Abnormal; Notable for the following components:   Glucose, Bld 324 (*)    Albumin 3.4 (*)    All other components within normal limits  TROPONIN I (HIGH SENSITIVITY) - Abnormal; Notable for the following components:   Troponin I (High Sensitivity) 19 (*)    All other components within normal limits  SARS CORONAVIRUS 2 BY RT PCR (HOSPITAL ORDER, PERFORMED IN [facility])  HEMOGLOBIN A1C  C-REACTIVE PROTEIN  URINALYSIS, COMPLETE (UACMP) WITH MICROSCOPIC  BLOOD GAS, ARTERIAL  TROPONIN I (HIGH SENSITIVITY)    EKG EKG Interpretation  Date/Time:  Friday Nov 08 2019 01:41:34 EDT Ventricular Rate:  117 PR Interval:    QRS Duration: 97 QT Interval:  346 QTC Calculation: 483 R Axis:   42 Text Interpretation: Sinus tachycardia Nonspecific T abnormalities, lateral leads Confirmed by 07-06-1988 (Zadie Rhine) on  11/08/2019 1:48:44 AM   Radiology DG Chest Port 1 View  Result Date: 11/08/2019 CLINICAL DATA:  Shortness of breath EXAM: PORTABLE CHEST 1 VIEW COMPARISON:  09/21/2011 FINDINGS: There is mild cardiomegaly with pulmonary vascular congestion. No overt edema. No pleural effusion or pneumothorax. No focal consolidation. IMPRESSION: Cardiomegaly and pulmonary vascular congestion without overt edema. Electronically Signed   By: 11/21/2011 M.D.   On: 11/08/2019 01:43    Procedures .Critical Care Performed by: 11/10/2019, MD Authorized by: Zadie Rhine, MD   Critical care provider statement:    Critical care time (minutes):  40   Critical care start time:  11/08/2019 1:50 AM   Critical care end time:  11/08/2019 2:30 AM   Critical care time was exclusive of:  Separately billable procedures and treating other patients   Critical care was necessary to treat or prevent imminent or life-threatening deterioration of the following conditions:  Respiratory failure   Critical care was time spent personally by me on the following activities:  Ordering and performing treatments and interventions, ordering and review of laboratory studies, ordering and review of radiographic studies, pulse oximetry, re-evaluation of patient's condition, examination of patient, evaluation of patient's response to treatment, discussions with consultants, development of treatment plan with patient or surrogate and obtaining history from patient or surrogate   I assumed direction of critical care for this patient from another provider in my specialty: no     Medications Ordered in ED Medications  furosemide (LASIX) injection 40 mg (has no administration in time range)  insulin glargine (LANTUS) injection 50 Units (has no administration in time range)  insulin aspart (novoLOG) injection 0-20 Units (has no administration in time range)  insulin aspart (novoLOG) injection 16 Units (has no administration in time range)    furosemide (LASIX) injection 80 mg (80 mg Intravenous Given 11/08/19 0120)    ED Course  I have reviewed the triage vital signs and the nursing notes.  Pertinent labs & imaging results that were available during my care of the patient were reviewed by me and considered in my medical decision making (see chart for details).    MDM Rules/Calculators/A&P                     1:52 AM Patient arrived in respiratory distress.  Strong suspicion for acute pulmonary edema and respiratory failure.  Patient was transitioned to BiPAP with improvement.  She has been given nitroglycerin and Lasix as well.  Work-up is pending at this time 4:41 AM Patient is improved.  Blood pressures improved.  Her work of breathing is improved. Patient does not report a previous known history of CHF.  Suspicious this is hypertensive emergency with acute pulmonary edema and respiratory failure Discussed with Dr. Cyd Silence for admission   This patient presents to the ED for concern of shortness of breath, this involves an extensive number of treatment options, and is a complaint that carries with it a high risk of complications and morbidity.  The differential diagnosis includes pneumonia, COVID-19, CHF, acute coronary syndrome   Lab Tests:   I Ordered, reviewed, and interpreted labs, which included BNP, metabolic panel, complete blood count  Medicines ordered:   I ordered medication to nitroglycerin Lasix for presumed CHF with pulmonary edema  Imaging Studies ordered:   I ordered imaging studies which included x-ray   I independently visualized and interpreted imaging which showed cardiomegaly and congestion   Consultations Obtained:   I consulted hospitalist and discussed lab and imaging findings  Reevaluation:  After the interventions stated above, I reevaluated the patient and found patient is improved  Critical Interventions:  . Noninvasive ventilation, nitroglycerin and Lasix  Final Clinical  Impression(s) / ED Diagnoses Final diagnoses:  Acute respiratory failure with hypoxia ([place])  Acute pulmonary edema [facility])  Hypertensive emergency    Rx / DC Orders ED Discharge Orders    None       Ripley Fraise, MD 11/08/19 [phone]

## 2019-11-08 NOTE — Progress Notes (Signed)
  Echocardiogram 2D Echocardiogram has been performed.  Delcie Roch 11/08/2019, 9:42 AM

## 2019-11-08 NOTE — H&P (Signed)
History and Physical    Whitney Sloan AVW:[id number] DOB: 03/10/1956 DOA: 11/08/2019  PCP: Carmel [place], NP  Patient coming from: Home   Chief Complaint:  Chief Complaint  Patient presents with  . 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  Patient has been obtained from a combination of the patient and the daughter who is at the bedside.  Approximately 1 month ago, the patient began to experience bilateral lower extremity swelling.  The weeks that followed, as patient's bilateral lower extremity swelling began to worsen, patient also began to experience episodic shortness of breath.  The shortness of breath initially was primarily associated with pillow orthopnea and paroxysmal nocturnal dyspnea.  As the weeks continue to progress patient began to also develop severe dyspnea on exertion that was worse with exertion and improved with rest.  Patient also notes associated increasing abdominal girth and a weight gain of at least 10 pounds over the span of time.  Upon further questioning patient denies fever, cough, sick contacts, recent travel, chest pain or confirmed contact with COVID-19.  Patient eventually presented to [facility] emergency department for evaluation where patient was found to be in respiratory distress with markedly elevated blood pressures initially concerning for hypertensive crisis and acute pulmonary edema.  Patient was placed on BiPAP, given 80 mg of IV Lasix and initiated on nitroglycerin infusion all of which resulted in improvement in shortness of breath.  The hospitalist group was then called to assess the patient for admission to the hospital.   Review of Systems: A 10-system review of systems  has been performed and all systems are negative with the exception of what is listed in the HPI.    Past Medical History:  Diagnosis Date  . Diabetes mellitus   . Hyperlipemia   . Hypertension   . Psoriasis     Past Surgical History:  Procedure Laterality Date  . ANAL FISSURE REPAIR    . ENDOMETRIAL ABLATION       reports that she has never smoked. She has never used smokeless tobacco. She reports that she does not drink alcohol or use drugs.  Allergies  Allergen Reactions  . Oxycodone-Acetaminophen Nausea And Vomiting    Family History  Problem Relation Age of Onset  . Dementia Mother   . Renal Disease Father   . Diabetes Other      Prior to Admission medications   Medication Sig Start Date End Date Taking? Authorizing Provider  amLODipine (NORVASC) 10 MG tablet Take 1 tablet (10 mg total) by mouth daily. For high blood pressure 05/30/12   Andrena Mews, DO  aspirin (ASPIR-LOW) 81 MG EC tablet Take 81 mg by mouth every morning.     [provider]  Cholecalciferol (VITAMIN D PO) Take 1 capsule by mouth daily.    [provider]  hydrochlorothiazide (HYDRODIURIL) 25 MG tablet Take 25 mg by mouth daily.  03/21/11   Edd Arbour, MD  insulin aspart (NOVOLOG FLEXPEN) 100 UNIT/ML FlexPen Inject 16 Units into the skin 3 (three) times daily with meals. 08/07/19   Shamleffer, Konrad Dolores, MD  insulin glargine (LANTUS) 100 UNIT/ML injection Inject 0.5 mLs (50 Units total) into the skin at bedtime. E11.65 10/01/19   Shamleffer, Konrad Dolores,  MD  lisinopril (PRINIVIL,ZESTRIL) 40 MG tablet Take 1 tablet (40 mg total) by mouth daily. 04/02/12   Andrena Mews, DO  metFORMIN (GLUCOPHAGE) 1000 MG tablet Take 1 tablet (1,000 mg total) by mouth 2 (two) times daily. 05/30/12   Andrena Mews, DO  nystatin (MYCOSTATIN/NYSTOP) powder Apply topically 4 (four) times daily. 07/22/16   Deatra Canter, FNP  nystatin cream (MYCOSTATIN) Apply to affected area 2 times  daily 07/22/16   Deatra Canter, FNP  simvastatin (ZOCOR) 20 MG tablet Take 1 tablet (20 mg total) by mouth at bedtime. 05/30/12   Andrena Mews, DO  UNIFINE PENTIPS 31G X 6 MM MISC USE TO INJECT INSULIN 03/21/11   Edd Arbour, MD    Physical Exam: Vitals:   11/08/19 0300 11/08/19 0315 11/08/19 0318 11/08/19 0421  BP: 132/81 120/79 120/79 133/84  Pulse: (!) 108 (!) 106 (!) 105 (!) 105  Resp: (!) 24 (!) 21 (!) 21 (!) 27  Temp:      TempSrc:      SpO2: 94% 95% 95% 94%  Height:        Constitutional: Acute alert and oriented x3, patient is not in any acute distress, patient is currently on BiPAP. Skin: no rashes, no lesions, good skin turgor noted. Eyes: Pupils are equally reactive to light.  No evidence of scleral icterus or conjunctival pallor.  ENMT: Moist mucous membranes noted.  Posterior pharynx clear of any exudate or lesions.   Neck: normal, supple, no masses, no thyromegaly.  Notable jugular venous distention at 45 degrees. Respiratory: clear to auscultation bilaterally, no wheezing, no crackles. Normal respiratory effort. No accessory muscle use.  Cardiovascular: Regular rate and rhythm, no murmurs / rubs / gallops extensive bilateral lower extremity edema that tracks up through the thighs.. 2+ pedal pulses. No carotid bruits.  Chest:   Nontender without crepitus or deformity.   Back:   Nontender without crepitus or deformity. Abdomen: Abdomen is protuberant but soft and nontender.  No evidence of intra-abdominal masses.  Positive bowel sounds noted in all quadrants.   Musculoskeletal: No joint deformity upper and lower extremities. Good ROM, no contractures. Normal muscle tone.  Neurologic: Patient is able to move all 4 extremities spontaneously.  Sensation is grossly intact.  Patient is following all commands.  Patient is responsive to verbal stimuli.  Psychiatric: Patient presents as a normal mood with appropriate affect.  Patient seems to possess insight as to  theircurrent situation.     Labs on Admission: I have personally reviewed following labs and imaging studies -   CBC: Recent Labs  Lab 11/08/19 0115  WBC 14.4*  NEUTROABS 6.2  HGB 13.3  HCT 43.1  MCV 87.8  PLT 338   Basic Metabolic Panel: Recent Labs  Lab 11/08/19 0115  NA 140  K 3.7  CL 100  CO2 25  GLUCOSE 324*  BUN 13  CREATININE 0.88  CALCIUM 8.9   GFR: CrCl cannot be calculated (Unknown ideal weight.). Liver Function Tests: Recent Labs  Lab 11/08/19 0115  AST 39  ALT 24  ALKPHOS 77  BILITOT 1.0  PROT 7.7  ALBUMIN 3.4*   No results for input(s): LIPASE, AMYLASE in the last 168 hours. No results for input(s): AMMONIA in the last 168 hours. Coagulation Profile: No results for input(s): INR, PROTIME in the last 168 hours. Cardiac Enzymes: No results for input(s): CKTOTAL, CKMB, CKMBINDEX, TROPONINI in the last 168 hours. BNP (last 3 results) No results for input(s): PROBNP in  the last 8760 hours. HbA1C: No results for input(s): HGBA1C in the last 72 hours. CBG: No results for input(s): GLUCAP in the last 168 hours. Lipid Profile: No results for input(s): CHOL, HDL, LDLCALC, TRIG, CHOLHDL, LDLDIRECT in the last 72 hours. Thyroid Function Tests: No results for input(s): TSH, T4TOTAL, FREET4, T3FREE, THYROIDAB in the last 72 hours. Anemia Panel: No results for input(s): VITAMINB12, FOLATE, FERRITIN, TIBC, IRON, RETICCTPCT in the last 72 hours. Urine analysis:    Component Value Date/Time   COLORURINE YELLOW 09/21/2011 0138   APPEARANCEUR CLOUDY (A) 09/21/2011 0138   LABSPEC 1.015 10/01/2016 1648   PHURINE 5.5 10/01/2016 1648   GLUCOSEU 500 (A) 10/01/2016 1648   HGBUR TRACE (A) 10/01/2016 1648   BILIRUBINUR NEGATIVE 10/01/2016 1648   KETONESUR TRACE (A) 10/01/2016 1648   PROTEINUR NEGATIVE 10/01/2016 1648   UROBILINOGEN 0.2 10/01/2016 1648   NITRITE NEGATIVE 10/01/2016 1648   LEUKOCYTESUR TRACE (A) 10/01/2016 1648    Radiological Exams on  Admission - Personally Reviewed: DG Chest Port 1 View  Result Date: 11/08/2019 CLINICAL DATA:  Shortness of breath EXAM: PORTABLE CHEST 1 VIEW COMPARISON:  09/21/2011 FINDINGS: There is mild cardiomegaly with pulmonary vascular congestion. No overt edema. No pleural effusion or pneumothorax. No focal consolidation. IMPRESSION: Cardiomegaly and pulmonary vascular congestion without overt edema. Electronically Signed   By: Ulyses Jarred M.D.   On: 11/08/2019 01:43    EKG: Personally reviewed.  Rhythm is sinus tachycardia with heart rate of 117 bpm.  No dynamic ST segment changes appreciated.  Assessment/Plan Principal Problem:   Acute respiratory failure with hypoxia [facility])   Patient presenting with acute respiratory failure and significant hypoxia upon arrival to the emergency department  This is felt to be secondary to acute cardiogenic pulmonary edema with hypertensive crisis in the setting of likely undiagnosed congestive heart failure.  Patient is clinically improving on several hours of BiPAP status post 80 mg of IV Lasix and a short course of nitroglycerin infusion  Attempted to wean patient off of BiPAP and nitroglycerin infusion while here in the emergency department, if we are successful, patient can be admitted to medical telemetry unit.  If we are unsuccessful patient will be admitted to stepdown unit.  Continue to provide patient with intravenous Lasix twice daily to achieve excellent diuresis  If we are able to wean patient off of BiPAP, will transition to supplemental oxygen via nasal cannula  Obtaining ABG once off BiPAP for 30 minutes  As needed bronchodilator therapy  Echocardiogram this morning  Resumption of home antihypertensives with additional intravenous antihypertensives for excessively elevated blood pressures.  Active Problems:   Acute cardiogenic pulmonary edema (HCC)   Please see assessment and plan above    Acute CHF (congestive heart failure)  (HCC)   Please see assessment and plan above    Hypertensive crisis   Please see assessment and plan above    Uncontrolled type 2 diabetes mellitus with hyperglycemia, with long-term current use of insulin (HCC)   Continue home regimen of Lantus 50 units every morning as well as NovoLog 16 units before every meal  Holding home regimen of metformin and Victoza  Obtain hemoglobin A1c  Accu-Cheks before every meal and nightly with sliding scale insulin    Mixed diabetic hyperlipidemia associated with type 2 diabetes mellitus ([place])  Continue home regimen of statin therapy   Code Status:  Full code Family Communication: Daughter is at bedside and has been updated on plan of care  Status is: Inpatient  Remains inpatient appropriate because:Ongoing diagnostic testing needed not appropriate for outpatient work up, IV treatments appropriate due to intensity of illness or inability to take PO and Inpatient level of care appropriate due to severity of illness   Dispo: The patient is from: Home              Anticipated d/c is to: Home              Anticipated d/c date is: > 3 days              Patient currently is not medically stable to d/c.         Marinda Elk MD [facility] Pager [phone]  If 7PM-7AM, please contact night-coverage [email or web address] Use universal [place] password for that web site. If you do not have the password, please call the hospital operator.  11/08/2019, 4:45 AM

## 2019-11-08 NOTE — ED Notes (Signed)
SDU 

## 2019-11-08 NOTE — Progress Notes (Signed)
PROGRESS NOTE    Whitney Sloan  JSE:[id number] DOB: [birth year] DOA: 11/08/2019 PCP: Carmel [place], NP   Brief Narrative:  HPI On 11/08/2019 by Dr. 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  Patient has been obtained from a combination of the patient and the daughter who is at the bedside.  Approximately 1 month ago, the patient began to experience bilateral lower extremity swelling.  The weeks that followed, as patient's bilateral lower extremity swelling began to worsen, patient also began to experience episodic shortness of breath.  The shortness of breath initially was primarily associated with pillow orthopnea and paroxysmal nocturnal dyspnea.  As the weeks continue to progress patient began to also develop severe dyspnea on exertion that was worse with exertion and improved with rest.  Patient also notes associated increasing abdominal girth and a weight gain of at least 10 pounds over the span of time.  Upon further questioning patient denies fever, cough, sick contacts, recent travel, chest pain or confirmed contact with COVID-19.  Patient eventually presented to [facility] emergency department for evaluation where patient was found to be in respiratory distress with markedly elevated blood pressures initially concerning for hypertensive crisis and acute pulmonary edema.  Patient was placed on BiPAP, given 80 mg of IV Lasix and initiated on nitroglycerin infusion all of which resulted in improvement in shortness of breath.  The hospitalist group was then called to assess the patient for admission to the hospital.  Assessment & Plan   Acute hypoxic respiratory failure with pulmonary edema -Patient required BiPAP on admission however has  now been transitioned to nasal cannula -Patient is also noted to have hypertensive crisis, also suspect some underlying component of congestive heart failure -Patient was given IV Lasix, 80 mg on admission as well as a nitro drip -Continue IV Lasix twice daily -Echocardiogram as below  Acute diastolic CHF -Echocardiogram shows an EF of 50 to 55%, grade 2 diastolic dysfunction -Pending echocardiogram -Continue IV Lasix -Monitor intake and output, daily weights  Hypertensive urgency -Patient did require nitroglycerin upon admission -Currently BP has normalized, 137/86 -Continue IV Lasix, amlodipine and monitor  Diabetes mellitus, type II, uncontrolled with hyperglycemia -Continue lantus, insulin sliding scale -metformin and Victoza held -Hemoglobin A1c 10.1  Hyperlipidemia -Continue statin  DVT Prophylaxis Lovenox  Code Status: Full  Family Communication: Daughter at bedside  Disposition Plan:  Status is: Inpatient  Remains inpatient appropriate because:IV treatments appropriate due to intensity of illness or inability to take PO   Dispo: The patient is from: Home              Anticipated d/c is to: Home              Anticipated d/c date is: 2 days              Patient currently is not medically stable to d/c.   Consultants None  Procedures  Echocardiogram  Antibiotics   Anti-infectives (From admission, onward)   None      Subjective:   Whitney Sloan seen and examined today.  Patient feels her breathing has improved mildly as compared to when she first arrived to the hospital.  She states she felt like she had been running a marathon.  She denies any chest pain, abdominal pain, nausea  or vomiting, diarrhea or constipation, dizziness or headache.  Objective:   Vitals:   11/08/19 0600 11/08/19 0615 11/08/19 0800 11/08/19 0900  BP: 136/78 135/79 128/75 137/86  Pulse: 100 98 97 (!) 102  Resp: (!) 24 (!) 26 (!) 24 19  Temp:      TempSrc:      SpO2: 93% 93%  96% 96%  Height:        Intake/Output Summary (Last 24 hours) at 11/08/2019 1410 Last data filed at 11/08/2019 0526 Gross per 24 hour  Intake 35.54 ml  Output --  Net 35.54 ml   There were no vitals filed for this visit.  Exam  General: Well developed, well nourished, NAD, appears stated age  HEENT: NCAT, mucous membranes moist.   Cardiovascular: S1 S2 auscultated, no rubs, murmurs or gallops. Regular rate and rhythm.  Respiratory: Clear to auscultation bilaterally with equal chest rise  Abdomen: Soft, obese, nontender, nondistended, + bowel sounds  Extremities: warm dry without cyanosis clubbing.  RLE edema  Neuro: AAOx3, focal  Psych: Normal affect and demeanor with intact judgement and insight   Data Reviewed: I have personally reviewed following labs and imaging studies  CBC: Recent Labs  Lab 11/08/19 0115 11/08/19 0604  WBC 14.4*  --   NEUTROABS 6.2  --   HGB 13.3 13.3  HCT 43.1 39.0  MCV 87.8  --   PLT 338  --    Basic Metabolic Panel: Recent Labs  Lab 11/08/19 0115 11/08/19 0604  NA 140 140  K 3.7 3.8  CL 100  --   CO2 25  --   GLUCOSE 324*  --   BUN 13  --   CREATININE 0.88  --   CALCIUM 8.9  --    GFR: CrCl cannot be calculated (Unknown ideal weight.). Liver Function Tests: Recent Labs  Lab 11/08/19 0115  AST 39  ALT 24  ALKPHOS 77  BILITOT 1.0  PROT 7.7  ALBUMIN 3.4*   No results for input(s): LIPASE, AMYLASE in the last 168 hours. No results for input(s): AMMONIA in the last 168 hours. Coagulation Profile: No results for input(s): INR, PROTIME in the last 168 hours. Cardiac Enzymes: No results for input(s): CKTOTAL, CKMB, CKMBINDEX, TROPONINI in the last 168 hours. BNP (last 3 results) No results for input(s): PROBNP in the last 8760 hours. HbA1C: Recent Labs    11/08/19 0529  HGBA1C 10.1*   CBG: Recent Labs  Lab 11/08/19 0840 11/08/19 1239  GLUCAP 314* 315*   Lipid Profile: No results for input(s): CHOL, HDL,  LDLCALC, TRIG, CHOLHDL, LDLDIRECT in the last 72 hours. Thyroid Function Tests: No results for input(s): TSH, T4TOTAL, FREET4, T3FREE, THYROIDAB in the last 72 hours. Anemia Panel: No results for input(s): VITAMINB12, FOLATE, FERRITIN, TIBC, IRON, RETICCTPCT in the last 72 hours. Urine analysis:    Component Value Date/Time   COLORURINE YELLOW 11/08/2019 0653   APPEARANCEUR CLEAR 11/08/2019 0653   LABSPEC 1.010 11/08/2019 0653   PHURINE 5.0 11/08/2019 0653   GLUCOSEU >=500 (A) 11/08/2019 0653   HGBUR MODERATE (A) 11/08/2019 0653   BILIRUBINUR NEGATIVE 11/08/2019 0653   KETONESUR NEGATIVE 11/08/2019 0653   PROTEINUR >=300 (A) 11/08/2019 0653   UROBILINOGEN 0.2 10/01/2016 1648   NITRITE NEGATIVE 11/08/2019 0653   LEUKOCYTESUR NEGATIVE 11/08/2019 0653   Sepsis Labs: @LABRCNTIP (procalcitonin:4,lacticidven:4)  ) Recent Results (from the past 240 hour(s))  SARS Coronavirus 2 by RT PCR (hospital order, performed in [facility] hospital lab) Nasopharyngeal Nasopharyngeal Swab  Status: None   Collection Time: 11/08/19  1:15 AM   Specimen: Nasopharyngeal Swab  Result Value Ref Range Status   SARS Coronavirus 2 NEGATIVE NEGATIVE Final    Comment: (NOTE) SARS-CoV-2 target nucleic acids are NOT DETECTED. The SARS-CoV-2 RNA is generally detectable in upper and lower respiratory specimens during the acute phase of infection. The lowest concentration of SARS-CoV-2 viral copies this assay can detect is 250 copies / mL. A negative result does not preclude SARS-CoV-2 infection and should not be used as the sole basis for treatment or other patient management decisions.  A negative result may occur with improper specimen collection / handling, submission of specimen other than nasopharyngeal swab, presence of viral mutation(s) within the areas targeted by this assay, and inadequate number of viral copies (<250 copies / mL). A negative result must be combined with clinical observations,  patient history, and epidemiological information. Fact Sheet for Patients:   StrictlyIdeas.no Fact Sheet for Healthcare Providers: BankingDealers.co.za This test is not yet approved or cleared  by the [place] FDA and has been authorized for detection and/or diagnosis of SARS-CoV-2 by FDA under an Emergency Use Authorization (EUA).  This EUA will remain in effect (meaning this test can be used) for the duration of the COVID-19 declaration under Section 564(b)(1) of the Act, 21 U.S.C. section 360bbb-3(b)(1), unless the authorization is terminated or revoked sooner. Performed at [facility], [place] [address] [place], [place] [address]       Radiology Studies: DG Chest Port 1 View  Result Date: 11/08/2019 CLINICAL DATA:  Shortness of breath EXAM: PORTABLE CHEST 1 VIEW COMPARISON:  09/21/2011 FINDINGS: There is mild cardiomegaly with pulmonary vascular congestion. No overt edema. No pleural effusion or pneumothorax. No focal consolidation. IMPRESSION: Cardiomegaly and pulmonary vascular congestion without overt edema. Electronically Signed   By: Ulyses Jarred M.D.   On: 11/08/2019 01:43   DG Foot Complete Right  Result Date: 11/08/2019 CLINICAL DATA:  Soft tissue swelling at the great toe without healing. Hit toe on door recently. EXAM: RIGHT FOOT COMPLETE - 3+ VIEW COMPARISON:  None. FINDINGS: Soft tissue swelling is present over the dorsum of foot at the MTP joints. No acute or healing fracture is present. IMPRESSION: Soft tissue swelling over the dorsum of the foot without underlying fracture. Electronically Signed   By: San Morelle M.D.   On: 11/08/2019 10:09   ECHOCARDIOGRAM COMPLETE  Result Date: 11/08/2019    ECHOCARDIOGRAM REPORT   Patient Name:   SHEALYN SEAN Date of Exam: 11/08/2019 Medical Rec #:  [id number]       Height:       65.0 in Accession #:    [id number]      Weight:       237.6 lb Date of Birth:  10-Nov-1955         BSA:          2.128 m Patient Age:    53 years        BP:           135/79 mmHg Patient Gender: F               HR:           102 bpm. Exam Location:  Inpatient Procedure: 2D Echo Indications:    acute systolic chf 237.62  History:        Patient has prior history of Echocardiogram examinations, most  recent 03/30/2009. Signs/Symptoms:Shortness of Breath and lower                 extremity edema; Risk Factors:Hypertension, Dyslipidemia and                 Diabetes.  Sonographer:    Delcie Roch Referring Phys: [id number] Marinda Elk  Sonographer Comments: Patient is morbidly obese. IMPRESSIONS  1. Left ventricular ejection fraction, by estimation, is 50 to 55%. The left ventricle has low normal function. The left ventricle demonstrates global hypokinesis. Left ventricular diastolic parameters are consistent with Grade II diastolic dysfunction (pseudonormalization). Elevated left ventricular end-diastolic pressure.  2. Right ventricular systolic function is normal. The right ventricular size is normal.  3. The mitral valve is abnormal. Trivial mitral valve regurgitation.  4. The aortic valve is tricuspid. Aortic valve regurgitation is not visualized. Mild aortic valve sclerosis is present, with no evidence of aortic valve stenosis.  5. The inferior vena cava is dilated in size with >50% respiratory variability, suggesting right atrial pressure of 8 mmHg. FINDINGS  Left Ventricle: Left ventricular ejection fraction, by estimation, is 50 to 55%. The left ventricle has low normal function. The left ventricle demonstrates global hypokinesis. The left ventricular internal cavity size was normal in size. There is no left ventricular hypertrophy. Left ventricular diastolic parameters are consistent with Grade II diastolic dysfunction (pseudonormalization). Elevated left ventricular end-diastolic pressure. Right Ventricle: The right ventricular size is normal. No increase in right ventricular  wall thickness. Right ventricular systolic function is normal. Left Atrium: Left atrial size was normal in size. Right Atrium: Right atrial size was normal in size. Pericardium: There is no evidence of pericardial effusion. Mitral Valve: The mitral valve is abnormal. There is mild thickening of the mitral valve leaflet(s). Trivial mitral valve regurgitation. Tricuspid Valve: The tricuspid valve is grossly normal. Tricuspid valve regurgitation is trivial. Aortic Valve: The aortic valve is tricuspid. Aortic valve regurgitation is not visualized. Mild aortic valve sclerosis is present, with no evidence of aortic valve stenosis. Pulmonic Valve: The pulmonic valve was normal in structure. Pulmonic valve regurgitation is not visualized. Aorta: The aortic root and ascending aorta are structurally normal, with no evidence of dilitation. Venous: The inferior vena cava is dilated in size with greater than 50% respiratory variability, suggesting right atrial pressure of 8 mmHg. IAS/Shunts: No atrial level shunt detected by color flow Doppler.  LEFT VENTRICLE PLAX 2D LVIDd:         5.50 cm     Diastology LVIDs:         4.10 cm     LV e' lateral:   6.31 cm/s LV PW:         1.00 cm     LV E/e' lateral: 21.2 LV IVS:        0.90 cm     LV e' medial:    4.79 cm/s LVOT diam:     2.00 cm     LV E/e' medial:  28.0 LV SV:         78 LV SV Index:   36 LVOT Area:     3.14 cm  LV Volumes (MOD) LV vol d, MOD A4C: 80.1 ml LV vol s, MOD A4C: 50.0 ml LV SV MOD A4C:     80.1 ml RIGHT VENTRICLE             IVC RV S prime:     13.70 cm/s  IVC diam: 2.50 cm TAPSE (M-mode): 1.7 cm LEFT  ATRIUM             Index LA diam:        4.20 cm 1.97 cm/m LA Vol (A2C):   69.2 ml 32.51 ml/m LA Vol (A4C):   57.9 ml 27.20 ml/m LA Biplane Vol: 66.7 ml 31.34 ml/m  AORTIC VALVE LVOT Vmax:   136.00 cm/s LVOT Vmean:  89.800 cm/s LVOT VTI:    0.247 m  AORTA Ao Root diam: 3.10 cm MITRAL VALVE MV Area (PHT): 5.97 cm     SHUNTS MV Decel Time: 127 msec     Systemic  VTI:  0.25 m MV E velocity: 134.00 cm/s  Systemic Diam: 2.00 cm MV A velocity: 97.40 cm/s MV E/A ratio:  1.38 Zoila Shutter MD Electronically signed by Zoila Shutter MD Signature Date/Time: 11/08/2019/11:01:57 AM    Final      Scheduled Meds: . furosemide  40 mg Intravenous BID  . insulin aspart  0-20 Units Subcutaneous TID AC & HS  . insulin aspart  16 Units Subcutaneous TID WC  . insulin glargine  50 Units Subcutaneous Daily   Continuous Infusions:   LOS: 0 days   Time Spent in minutes   45 minutes  Kellin Fifer D.O. on 11/08/2019 at 2:10 PM  Between 7am to 7pm - Please see pager noted on [email or web address]  After 7pm go to [email or web address]  And look for the night coverage person covering for me after hours  [facility] Office  [phone]

## 2019-11-08 NOTE — Progress Notes (Signed)
Patient trialed off BIPAP per MD. Patient looks and states she feels much better. Patient informed to let us know if she starts to feel SOB again. RT will continue to monitor.

## 2019-11-09 ENCOUNTER — Other Ambulatory Visit: Payer: Self-pay

## 2019-11-09 LAB — GLUCOSE, CAPILLARY
Glucose-Capillary: 146 mg/dL — ABNORMAL HIGH (ref 70–99)
Glucose-Capillary: 130 mg/dL — ABNORMAL HIGH (ref 70–99)
Glucose-Capillary: 136 mg/dL — ABNORMAL HIGH (ref 70–99)
Glucose-Capillary: 179 mg/dL — ABNORMAL HIGH (ref 70–99)

## 2019-11-09 LAB — CBC
HCT: 39.6 % (ref 36.0–46.0)
Hemoglobin: 12.5 g/dL (ref 12.0–15.0)
MCH: 27.2 pg (ref 26.0–34.0)
MCHC: 31.6 g/dL (ref 30.0–36.0)
MCV: 86.3 fL (ref 80.0–100.0)
Platelets: 311 10*3/uL (ref 150–400)
RBC: 4.59 MIL/uL (ref 3.87–5.11)
RDW: 14.9 % (ref 11.5–15.5)
WBC: 15.7 10*3/uL — ABNORMAL HIGH (ref 4.0–10.5)
nRBC: 0 % (ref 0.0–0.2)

## 2019-11-09 LAB — BASIC METABOLIC PANEL
Anion gap: 10 (ref 5–15)
BUN: 19 mg/dL (ref 8–23)
CO2: 29 mmol/L (ref 22–32)
Calcium: 9 mg/dL (ref 8.9–10.3)
Chloride: 103 mmol/L (ref 98–111)
Creatinine, Ser: 0.73 mg/dL (ref 0.44–1.00)
GFR calc Af Amer: >60 mL/min (ref >60)
GFR calc non Af Amer: >60 mL/min (ref >60)
Glucose, Bld: 170 mg/dL — ABNORMAL HIGH (ref 70–99)
Potassium: 3.5 mmol/L (ref 3.5–5.1)
Sodium: 142 mmol/L (ref 135–145)

## 2019-11-09 LAB — HIV ANTIBODY (ROUTINE TESTING W REFLEX): HIV Screen 4th Generation wRfx: NONREACTIVE

## 2019-11-09 MED ORDER — ADULT MULTIVITAMIN W/MINERALS CH
1.0000 | ORAL_TABLET | Freq: Every day | ORAL | Status: DC
  Administered 2019-11-10 – 2019-11-15 (×6): 1 via ORAL
  Filled 2019-11-09 (×6): qty 1

## 2019-11-09 MED ORDER — PNEUMOCOCCAL VAC POLYVALENT 25 MCG/0.5ML IJ INJ
0.5000 mL | INJECTION | INTRAMUSCULAR | Status: DC
  Filled 2019-11-09: qty 0.5

## 2019-11-09 MED ORDER — LIVING BETTER WITH HEART FAILURE BOOK: Freq: Once | Status: AC

## 2019-11-09 MED ORDER — POTASSIUM CHLORIDE CRYS ER 20 MEQ PO TBCR
40.0000 meq | EXTENDED_RELEASE_TABLET | Freq: Once | ORAL | Status: AC
  Administered 2019-11-09: 40 meq via ORAL
  Filled 2019-11-09: qty 2

## 2019-11-09 NOTE — Progress Notes (Signed)
Initial Nutrition Assessment  DOCUMENTATION CODES:   Morbid obesity  INTERVENTION:  Diet education handout provided in discharge instructions MVI with minerals daily  NUTRITION DIAGNOSIS:   Altered nutrition lab value related to chronic illness(uncontrolled DM2) as evidenced by (A1c 10.1).   GOAL:   (Patient will adhere to dietary recommendations to obtain better glucose control, lowering A1c)  MONITOR:   PO intake, Labs, I & O's, Weight trends  REASON FOR ASSESSMENT:   Consult Assessment of nutrition requirement/status, Diet education  ASSESSMENT:  RD working remotely.  64 year old female with past medical history of DM2, HLD, HTN, obesity, history of stroke in 2011 with residual gait ataxia and slurring of speech admitted for acute respiratory failure with hypoxia and pulmonary edema after presenting due to episodic SOB, BLE swelling, and incrased abdominal girth that has been worsening over the past month.  Patient required BiPAP on admission, has transitioned to 4 L O2 via [place] per flowsheets.   RD attempted to contact patient this afternoon via phone, however pt did not answer. Unable to obtain nutrition history at this time. Per flowsheets, pt consumed 100% x 1 documented meal. Patient with uncontrolled DM2, last A1c 10.1 on 11/08/19. RD will attach heart healthy consistent carbohydrate diet education handout to discharge instructions.   Current wt 258.06 lb Per history weights have trended up 20.9 lb in 2 months, suspect this is due to fluid status, noted elevated BNP and mild pitting BLE edema. On 2/24 pt weighed 237.16 lb, on 05/06/19 she weighed 233.86 lb and on 09/10/18 pt weighed 222.64 lb.  I/Os: -1564 ml since admit UOP: 2100 ml x 24 hrs Medications reviewed and include: Lasix IV 40 mg twice daily, SSI, Lantus 50 units daily Labs: CBGs [phone], BNP 132.6 (H), WBC 14.4 Lab Results  Component Value Date   HGBA1C 10.1 (H) 11/08/2019     NUTRITION -  FOCUSED PHYSICAL EXAM: Unable to complete at this time, RD working remotely.    Diet Order:   Diet Order            Diet heart healthy/carb modified Room service appropriate? Yes; Fluid consistency: Thin  Diet effective now              EDUCATION NEEDS:   Education needs have been addressed  Skin:  Skin Assessment: Reviewed RN Assessment  Last BM:  5/27  Height:   Ht Readings from Last 1 Encounters:  11/08/19 5\' 5"  (1.651 m)    Weight:   Wt Readings from Last 1 Encounters:  11/09/19 117.3 kg    Ideal Body Weight:  56.8 kg  BMI:  Body mass index is 43.03 kg/m.  Estimated Nutritional Needs:   Kcal:  1800-2000 (32-35 g/kg/IBW)  Protein:  90-100  Fluid:  1.8 L   11/11/19, RD, LDN Clinical Nutrition After Hours/Weekend Pager # in Amion

## 2019-11-09 NOTE — Progress Notes (Signed)
Pts order is PRN. Pt not in need of at this time. Pt respiratory status is stable at this time. Pt in no distress. RT will continue to monitor.

## 2019-11-09 NOTE — Evaluation (Signed)
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 This has progressively worsened over ~4 weeks PTA. Required BiPAP <24 hrs. Newly diagnosed CHF. Hypertensive urgency  Clinical Impression   Pt admitted with above diagnosis. Patient was mobilizing independently without a device inside apartment until several weeks PTA. She currently required 4L [place] O2 to transfer bed to chair with dyspnea 3/4. Her daughter lives with her, however patient is alone during the day with daughter at work. If she requires home O2 be set up for her, would be concerned re: tripping hazard this creates (especially if she is also needing to use her rollator) and would recommend 24/7 supervision until she can demonstrate ability to mobilize safely in her home environment. Pt currently with functional limitations due to the deficits listed below (see PT Problem List). Pt will benefit from skilled PT to increase their independence and safety with mobility to allow discharge to the venue listed below.    Note: at rest on arrival on 4L with HR 102, RR 23, Sats 97%; with activity on 4L HR max 118, RR>30 with difficulty speaking in short phrases; ?sats (not registering, but once seated 96%); during education continued to turn down oxygen until on room air with sats 92%. Returned to 1L with sats 94% and RN aware.      Follow Up Recommendations Home health PT; Supervision/Assistance - 24 hour--if goes home on home O2, rec 24/7 initially   Equipment Recommendations  None recommended by PT    Recommendations for Other Services OT consult     Precautions /  Restrictions Precautions Precautions: Other (comment) Precaution Comments: watch O2 sats and dyspnea      Mobility  Bed Mobility Overal bed mobility: Modified Independent             General bed mobility comments: HOB elevated due to respiratory status and swollen abdomen  Transfers Overall transfer level: Needs assistance Equipment used: 1 person hand held assist Transfers: Sit to/from Stand;Stand Pivot Transfers Sit to Stand: Min assist Stand pivot transfers: Min assist       General transfer comment: pt with wide BOS due to body habitus; incr dyspnea with standing  Ambulation/Gait             General Gait Details: deferred due to dyspnea, incr HR, ?sats (not registering until seated--96% on 4L0  Stairs            Wheelchair Mobility    Modified Rankin (Stroke Patients Only)       Balance Overall balance assessment: Mild deficits observed, not formally tested                                           Pertinent Vitals/Pain Pain Assessment: No/denies pain    Home Living Family/patient expects to be discharged to:: Private residence Living Arrangements: Children(28 yo dtr works 9-5 ) Available Help at Discharge: Family;Available PRN/intermittently Type of Home: Apartment Home Access: Stairs to enter Entrance Stairs-Rails: None Entrance Stairs-Number of Steps: 1 Home Layout: One level Home Equipment: Cane - quad;Walker - 4 wheels  Prior Function Level of Independence: Independent with assistive device(s)         Comments: uses quad cane at times; uses rollator to carry groceries from the car; daughter does the driving errands; assists with housework     Hand Dominance        Extremity/Trunk Assessment   Upper Extremity Assessment Upper Extremity Assessment: Defer to OT evaluation    Lower Extremity Assessment Lower Extremity Assessment: Generalized weakness(bil LE edema)    Cervical / Trunk  Assessment Cervical / Trunk Assessment: Other exceptions Cervical / Trunk Exceptions: overweight, plus fluid retention  Communication   Communication: No difficulties  Cognition Arousal/Alertness: Awake/alert Behavior During Therapy: WFL for tasks assessed/performed Overall Cognitive Status: Within Functional Limits for tasks assessed                                 General Comments: a&ox4      General Comments General comments (skin integrity, edema, etc.): Educated pt on pursed lip breathing to assist with slowing rate. Patient with difficulty return demonstrating. Educated on role of PT and concerns re: returning home if alone during the day.     Exercises General Exercises - Lower Extremity Ankle Circles/Pumps: AROM;Both;10 reps(encouraged freq reps for edema)   Assessment/Plan    PT Assessment Patient needs continued PT services  PT Problem List Decreased strength;Decreased activity tolerance;Decreased mobility;Decreased knowledge of use of DME;Cardiopulmonary status limiting activity;Obesity       PT Treatment Interventions DME instruction;Gait training;Functional mobility training;Therapeutic activities;Therapeutic exercise;Patient/family education    PT Goals (Current goals can be found in the Care Plan section)  Acute Rehab PT Goals Patient Stated Goal: go home without oxygen or needing to use rollator PT Goal Formulation: With patient Time For Goal Achievement: 11/23/19 Potential to Achieve Goals: Good    Frequency Min 3X/week   Barriers to discharge Decreased caregiver support alone during day    Co-evaluation               AM-PAC PT "6 Clicks" Mobility  Outcome Measure Help needed turning from your back to your side while in a flat bed without using bedrails?: A Little Help needed moving from lying on your back to sitting on the side of a flat bed without using bedrails?: A Lot Help needed moving to and from a bed to a chair (including a  wheelchair)?: A Little Help needed standing up from a chair using your arms (e.g., wheelchair or bedside chair)?: A Little Help needed to walk in hospital room?: Total Help needed climbing 3-5 steps with a railing? : Total 6 Click Score: 13    End of Session Equipment Utilized During Treatment: Oxygen Activity Tolerance: Treatment limited secondary to medical complications (Comment)(dyspnea 3/4) Patient left: in chair;with call bell/phone within reach;with nursing/sitter in room Nurse Communication: Mobility status;Other (comment)(O2 turned down to 1L after transfer to chair) PT Visit Diagnosis: Difficulty in walking, not elsewhere classified (R26.2);Muscle weakness (generalized) (M62.81)    Time: 5573-2202 PT Time Calculation (min) (ACUTE ONLY): 32 min   Charges:   PT Evaluation $PT Eval Moderate Complexity: 1 Mod PT Treatments $Self Care/Home Management: 8-22         Whitney Sloan, PT Pager [phone]   Whitney Sloan 11/09/2019, 4:59 PM

## 2019-11-09 NOTE — Discharge Instructions (Signed)

## 2019-11-09 NOTE — Progress Notes (Signed)
PROGRESS NOTE    LILLIA LENGEL  UQJ:[id number] DOB: 10-Feb-1956 DOA: 11/08/2019 PCP: Carmel Sacramento, NP   Brief Narrative:  HPI On 11/08/2019 by Dr. 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  Patient has been obtained from a combination of the patient and the daughter who is at the bedside.  Approximately 1 month ago, the patient began to experience bilateral lower extremity swelling.  The weeks that followed, as patient's bilateral lower extremity swelling began to worsen, patient also began to experience episodic shortness of breath.  The shortness of breath initially was primarily associated with pillow orthopnea and paroxysmal nocturnal dyspnea.  As the weeks continue to progress patient began to also develop severe dyspnea on exertion that was worse with exertion and improved with rest.  Patient also notes associated increasing abdominal girth and a weight gain of at least 10 pounds over the span of time.  Upon further questioning patient denies fever, cough, sick contacts, recent travel, chest pain or confirmed contact with COVID-19.  Patient eventually presented to [facility] emergency department for evaluation where patient was found to be in respiratory distress with markedly elevated blood pressures initially concerning for hypertensive crisis and acute pulmonary edema.  Patient was placed on BiPAP, given 80 mg of IV Lasix and initiated on nitroglycerin infusion all of which resulted in improvement in shortness of breath.  The hospitalist group was then called to assess the patient for admission to the hospital.  Assessment & Plan   Acute hypoxic respiratory failure with pulmonary edema -Patient required BiPAP on admission however has  now been transitioned to nasal cannula -Patient is also noted to have hypertensive crisis, also suspect some underlying component of congestive heart failure -Patient was given IV Lasix, 80 mg on admission as well as a nitro drip -Continue IV Lasix twice daily -Echocardiogram as below -Attempt to wean supplemental oxygen  Acute diastolic CHF -Echocardiogram shows an EF of 50 to 55%, grade 2 diastolic dysfunction -Continue IV Lasix -Monitor intake and output, daily weights -Urine output over the past 24 hours 2100 cc -Have ordered CHF information  Hypertensive urgency -Patient did require nitroglycerin upon admission -Continue IV Lasix, amlodipine, lisinopril and monitor -BP is better controlled today, currently 132/80  Diabetes mellitus, type II, uncontrolled with hyperglycemia -Continue lantus, insulin sliding scale -metformin and Victoza held -Hemoglobin A1c 10.1 -Have discussed the importance of controlling patient's blood sugar -Nutrition consulted  Hyperlipidemia -Continue statin  DVT Prophylaxis Lovenox  Code Status: Full  Family Communication: Daughter at bedside (all questions answered)  Disposition Plan:  Status is: Inpatient  Remains inpatient appropriate because:IV treatments appropriate due to intensity of illness or inability to take PO   Dispo: The patient is from: Home              Anticipated d/c is to: Home              Anticipated d/c date is: 2 days              Patient currently is not medically stable to d/c.   Consultants None  Procedures  Echocardiogram  Antibiotics   Anti-infectives (From admission, onward)   None      Subjective:   Sireen Halk seen and examined today.  She is feeling better than  the last few days.  She does to continue to have some shortness of breath but feels it is also getting better but not back to her normal.  She denies any chest pain or abdominal pain, nausea or vomiting, diarrhea or constipation, dizziness  or headache.  Patient did state that she thought maybe the Victoza had caused her shortness of breath.    Objective:   Vitals:   11/08/19 1656 11/08/19 2249 11/09/19 0300 11/09/19 0743  BP: (!) 156/98 (!) 149/95  132/80  Pulse: (!) 109 (!) 105  (!) 102  Resp: 16 20  16   Temp: 98.1 F (36.7 C) 97.7 F (36.5 C)  98 F (36.7 C)  TempSrc: Oral Oral    SpO2: 99% 98%  98%  Weight:   117.3 kg   Height:        Intake/Output Summary (Last 24 hours) at 11/09/2019 1030 Last data filed at 11/09/2019 0350 Gross per 24 hour  Intake 500 ml  Output 2100 ml  Net -1600 ml   Filed Weights   11/09/19 0300  Weight: 117.3 kg   Exam  General: Well developed, well nourished, NAD, appears stated age  HEENT: NCAT, mucous membranes moist.   Cardiovascular: S1 S2 auscultated, RRR  Respiratory: Diminished breath sounds however clear, no wheezing  Abdomen: Soft, obese, nontender, nondistended, + bowel sounds  Extremities: warm dry without cyanosis clubbing or edema  Neuro: AAOx3, nonfocal  Psych: Appropriate mood and affect, pleasant  Data Reviewed: I have personally reviewed following labs and imaging studies  CBC: Recent Labs  Lab 11/08/19 0115 11/08/19 0604 11/09/19 0442  WBC 14.4*  --  15.7*  NEUTROABS 6.2  --   --   HGB 13.3 13.3 12.5  HCT 43.1 39.0 39.6  MCV 87.8  --  86.3  PLT 338  --  299   Basic Metabolic Panel: Recent Labs  Lab 11/08/19 0115 11/08/19 0604 11/09/19 0442  NA 140 140 142  K 3.7 3.8 3.5  CL 100  --  103  CO2 25  --  29  GLUCOSE 324*  --  170*  BUN 13  --  19  CREATININE 0.88  --  0.73  CALCIUM 8.9  --  9.0   GFR: Estimated Creatinine Clearance: 91 mL/min (by C-G formula based on SCr of 0.73 mg/dL). Liver Function Tests: Recent Labs  Lab 11/08/19 0115  AST 39  ALT 24  ALKPHOS 77  BILITOT 1.0  PROT 7.7  ALBUMIN 3.4*   No results for input(s): LIPASE, AMYLASE in the last 168 hours. No results for input(s): AMMONIA in the last 168  hours. Coagulation Profile: No results for input(s): INR, PROTIME in the last 168 hours. Cardiac Enzymes: No results for input(s): CKTOTAL, CKMB, CKMBINDEX, TROPONINI in the last 168 hours. BNP (last 3 results) No results for input(s): PROBNP in the last 8760 hours. HbA1C: Recent Labs    11/08/19 0529  HGBA1C 10.1*   CBG: Recent Labs  Lab 11/08/19 0840 11/08/19 1239 11/08/19 1648 11/08/19 2152 11/09/19 0741  GLUCAP 314* 315* 364* 237* 146*   Lipid Profile: No results for input(s): CHOL, HDL, LDLCALC, TRIG, CHOLHDL, LDLDIRECT in the last 72 hours. Thyroid Function Tests: No results for input(s): TSH, T4TOTAL, FREET4, T3FREE, THYROIDAB in the last 72 hours. Anemia Panel: No results for input(s): VITAMINB12, FOLATE, FERRITIN, TIBC, IRON, RETICCTPCT in the last 72 hours. Urine analysis:    Component Value Date/Time   COLORURINE YELLOW 11/08/2019 [place] 11/08/2019  0653   LABSPEC 1.010 11/08/2019 0653   PHURINE 5.0 11/08/2019 0653   GLUCOSEU >=500 (A) 11/08/2019 0653   HGBUR MODERATE (A) 11/08/2019 0653   BILIRUBINUR NEGATIVE 11/08/2019 0653   KETONESUR NEGATIVE 11/08/2019 0653   PROTEINUR >=300 (A) 11/08/2019 0653   UROBILINOGEN 0.2 10/01/2016 1648   NITRITE NEGATIVE 11/08/2019 0653   LEUKOCYTESUR NEGATIVE 11/08/2019 0653   Sepsis Labs: @LABRCNTIP (procalcitonin:4,lacticidven:4)  ) Recent Results (from the past 240 hour(s))  SARS Coronavirus 2 by RT PCR (hospital order, performed in [facility] hospital lab) Nasopharyngeal Nasopharyngeal Swab     Status: None   Collection Time: 11/08/19  1:15 AM   Specimen: Nasopharyngeal Swab  Result Value Ref Range Status   SARS Coronavirus 2 NEGATIVE NEGATIVE Final    Comment: (NOTE) SARS-CoV-2 target nucleic acids are NOT DETECTED. The SARS-CoV-2 RNA is generally detectable in upper and lower respiratory specimens during the acute phase of infection. The lowest concentration of SARS-CoV-2 viral copies this  assay can detect is 250 copies / mL. A negative result does not preclude SARS-CoV-2 infection and should not be used as the sole basis for treatment or other patient management decisions.  A negative result may occur with improper specimen collection / handling, submission of specimen other than nasopharyngeal swab, presence of viral mutation(s) within the areas targeted by this assay, and inadequate number of viral copies (<250 copies / mL). A negative result must be combined with clinical observations, patient history, and epidemiological information. Fact Sheet for Patients:   11/10/19 Fact Sheet for Healthcare Providers: BoilerBrush.com.cy This test is not yet approved or cleared  by the https://pope.com/ FDA and has been authorized for detection and/or diagnosis of SARS-CoV-2 by FDA under an Emergency Use Authorization (EUA).  This EUA will remain in effect (meaning this test can be used) for the duration of the COVID-19 declaration under Section 564(b)(1) of the Act, 21 U.S.C. section 360bbb-3(b)(1), unless the authorization is terminated or revoked sooner. Performed at [facility], [address], [place], [place]       Radiology Studies: DG Chest Port 1 View  Result Date: 11/08/2019 CLINICAL DATA:  Shortness of breath EXAM: PORTABLE CHEST 1 VIEW COMPARISON:  09/21/2011 FINDINGS: There is mild cardiomegaly with pulmonary vascular congestion. No overt edema. No pleural effusion or pneumothorax. No focal consolidation. IMPRESSION: Cardiomegaly and pulmonary vascular congestion without overt edema. Electronically Signed   By: 11/21/2011 M.D.   On: 11/08/2019 01:43   DG Foot Complete Right  Result Date: 11/08/2019 CLINICAL DATA:  Soft tissue swelling at the great toe without healing. Hit toe on door recently. EXAM: RIGHT FOOT COMPLETE - 3+ VIEW COMPARISON:  None. FINDINGS: Soft tissue swelling is present over  the dorsum of foot at the MTP joints. No acute or healing fracture is present. IMPRESSION: Soft tissue swelling over the dorsum of the foot without underlying fracture. Electronically Signed   By: 11/10/2019 M.D.   On: 11/08/2019 10:09   ECHOCARDIOGRAM COMPLETE  Result Date: 11/08/2019    ECHOCARDIOGRAM REPORT   Patient Name:   ARNITRA SOKOLOSKI Date of Exam: 11/08/2019 Medical Rec #:  11/10/2019       Height:       65.0 in Accession #:    [id number]      Weight:       237.6 lb Date of Birth:  [birth year]        BSA:          2.128  m Patient Age:    64 years        BP:           135/79 mmHg Patient Gender: F               HR:           102 bpm. Exam Location:  Inpatient Procedure: 2D Echo Indications:    acute systolic chf 428.21  History:        Patient has prior history of Echocardiogram examinations, most                 recent 03/30/2009. Signs/Symptoms:Shortness of Breath and lower                 extremity edema; Risk Factors:Hypertension, Dyslipidemia and                 Diabetes.  Sonographer:    Delcie Roch Referring Phys: [id number] Marinda Elk  Sonographer Comments: Patient is morbidly obese. IMPRESSIONS  1. Left ventricular ejection fraction, by estimation, is 50 to 55%. The left ventricle has low normal function. The left ventricle demonstrates global hypokinesis. Left ventricular diastolic parameters are consistent with Grade II diastolic dysfunction (pseudonormalization). Elevated left ventricular end-diastolic pressure.  2. Right ventricular systolic function is normal. The right ventricular size is normal.  3. The mitral valve is abnormal. Trivial mitral valve regurgitation.  4. The aortic valve is tricuspid. Aortic valve regurgitation is not visualized. Mild aortic valve sclerosis is present, with no evidence of aortic valve stenosis.  5. The inferior vena cava is dilated in size with >50% respiratory variability, suggesting right atrial pressure of 8 mmHg. FINDINGS  Left  Ventricle: Left ventricular ejection fraction, by estimation, is 50 to 55%. The left ventricle has low normal function. The left ventricle demonstrates global hypokinesis. The left ventricular internal cavity size was normal in size. There is no left ventricular hypertrophy. Left ventricular diastolic parameters are consistent with Grade II diastolic dysfunction (pseudonormalization). Elevated left ventricular end-diastolic pressure. Right Ventricle: The right ventricular size is normal. No increase in right ventricular wall thickness. Right ventricular systolic function is normal. Left Atrium: Left atrial size was normal in size. Right Atrium: Right atrial size was normal in size. Pericardium: There is no evidence of pericardial effusion. Mitral Valve: The mitral valve is abnormal. There is mild thickening of the mitral valve leaflet(s). Trivial mitral valve regurgitation. Tricuspid Valve: The tricuspid valve is grossly normal. Tricuspid valve regurgitation is trivial. Aortic Valve: The aortic valve is tricuspid. Aortic valve regurgitation is not visualized. Mild aortic valve sclerosis is present, with no evidence of aortic valve stenosis. Pulmonic Valve: The pulmonic valve was normal in structure. Pulmonic valve regurgitation is not visualized. Aorta: The aortic root and ascending aorta are structurally normal, with no evidence of dilitation. Venous: The inferior vena cava is dilated in size with greater than 50% respiratory variability, suggesting right atrial pressure of 8 mmHg. IAS/Shunts: No atrial level shunt detected by color flow Doppler.  LEFT VENTRICLE PLAX 2D LVIDd:         5.50 cm     Diastology LVIDs:         4.10 cm     LV e' lateral:   6.31 cm/s LV PW:         1.00 cm     LV E/e' lateral: 21.2 LV IVS:        0.90 cm     LV e' medial:  4.79 cm/s LVOT diam:     2.00 cm     LV E/e' medial:  28.0 LV SV:         78 LV SV Index:   36 LVOT Area:     3.14 cm  LV Volumes (MOD) LV vol d, MOD A4C: 80.1 ml LV  vol s, MOD A4C: 50.0 ml LV SV MOD A4C:     80.1 ml RIGHT VENTRICLE             IVC RV S prime:     13.70 cm/s  IVC diam: 2.50 cm TAPSE (M-mode): 1.7 cm LEFT ATRIUM             Index LA diam:        4.20 cm 1.97 cm/m LA Vol (A2C):   69.2 ml 32.51 ml/m LA Vol (A4C):   57.9 ml 27.20 ml/m LA Biplane Vol: 66.7 ml 31.34 ml/m  AORTIC VALVE LVOT Vmax:   136.00 cm/s LVOT Vmean:  89.800 cm/s LVOT VTI:    0.247 m  AORTA Ao Root diam: 3.10 cm MITRAL VALVE MV Area (PHT): 5.97 cm     SHUNTS MV Decel Time: 127 msec     Systemic VTI:  0.25 m MV E velocity: 134.00 cm/s  Systemic Diam: 2.00 cm MV A velocity: 97.40 cm/s MV E/A ratio:  1.38 Zoila Shutter MD Electronically signed by Zoila Shutter MD Signature Date/Time: 11/08/2019/11:01:57 AM    Final      Scheduled Meds: . amLODipine  10 mg Oral Daily  . aspirin EC  81 mg Oral Daily  . Chlorhexidine Gluconate Cloth  6 each Topical Daily  . enoxaparin (LOVENOX) injection  40 mg Subcutaneous Q24H  . furosemide  40 mg Intravenous BID  . insulin aspart  0-20 Units Subcutaneous TID AC & HS  . insulin aspart  16 Units Subcutaneous TID WC  . insulin glargine  50 Units Subcutaneous Daily  . lisinopril  40 mg Oral Daily  . [START ON 11/10/2019] pneumococcal 23 valent vaccine  0.5 mL Intramuscular Tomorrow-1000  . simvastatin  20 mg Oral QHS  . sodium chloride flush  3 mL Intravenous Q12H   Continuous Infusions: . sodium chloride       LOS: 1 day   Time Spent in minutes   45 minutes (greater than 50% of time spent with patient face to face, as well as reviewing records, answering all questions, and formulating a plan)   Edsel Petrin D.O. on 11/09/2019 at 10:30 AM  Between 7am to 7pm - Please see pager noted on [email or web address]  After 7pm go to [email or web address]  And look for the night coverage person covering for me after hours  [facility] Office  [phone]

## 2019-11-10 LAB — BASIC METABOLIC PANEL
Anion gap: 8 (ref 5–15)
BUN: 17 mg/dL (ref 8–23)
CO2: 30 mmol/L (ref 22–32)
Calcium: 8.8 mg/dL — ABNORMAL LOW (ref 8.9–10.3)
Chloride: 104 mmol/L (ref 98–111)
Creatinine, Ser: 0.66 mg/dL (ref 0.44–1.00)
GFR calc Af Amer: >60 mL/min (ref >60)
GFR calc non Af Amer: >60 mL/min (ref >60)
Glucose, Bld: 161 mg/dL — ABNORMAL HIGH (ref 70–99)
Potassium: 3.6 mmol/L (ref 3.5–5.1)
Sodium: 142 mmol/L (ref 135–145)

## 2019-11-10 LAB — CBC
HCT: 39.7 % (ref 36.0–46.0)
Hemoglobin: 12.3 g/dL (ref 12.0–15.0)
MCH: 26.9 pg (ref 26.0–34.0)
MCHC: 31 g/dL (ref 30.0–36.0)
MCV: 86.9 fL (ref 80.0–100.0)
Platelets: 297 10*3/uL (ref 150–400)
RBC: 4.57 MIL/uL (ref 3.87–5.11)
RDW: 14.8 % (ref 11.5–15.5)
WBC: 13.2 10*3/uL — ABNORMAL HIGH (ref 4.0–10.5)
nRBC: 0 % (ref 0.0–0.2)

## 2019-11-10 LAB — GLUCOSE, CAPILLARY
Glucose-Capillary: 144 mg/dL — ABNORMAL HIGH (ref 70–99)
Glucose-Capillary: 141 mg/dL — ABNORMAL HIGH (ref 70–99)
Glucose-Capillary: 121 mg/dL — ABNORMAL HIGH (ref 70–99)

## 2019-11-10 MED ORDER — ZOLPIDEM TARTRATE 5 MG PO TABS
5.0000 mg | ORAL_TABLET | Freq: Every evening | ORAL | Status: DC | PRN
  Administered 2019-11-10 – 2019-11-14 (×5): 5 mg via ORAL
  Filled 2019-11-10 (×5): qty 1

## 2019-11-10 MED ORDER — BISACODYL 5 MG PO TBEC
5.0000 mg | DELAYED_RELEASE_TABLET | Freq: Every day | ORAL | Status: DC | PRN
  Administered 2019-11-10: 5 mg via ORAL
  Filled 2019-11-10: qty 1

## 2019-11-10 MED ORDER — FUROSEMIDE 10 MG/ML IJ SOLN
40.0000 mg | Freq: Once | INTRAMUSCULAR | Status: AC
  Administered 2019-11-11: 40 mg via INTRAVENOUS
  Filled 2019-11-10: qty 4

## 2019-11-10 MED ORDER — TAMSULOSIN HCL 0.4 MG PO CAPS
0.4000 mg | ORAL_CAPSULE | Freq: Every day | ORAL | Status: DC
  Administered 2019-11-10 – 2019-11-15 (×6): 0.4 mg via ORAL
  Filled 2019-11-10 (×6): qty 1

## 2019-11-10 NOTE — Evaluation (Signed)
Unclamped patient's foley for evening dose of Lasix. She says she feels like she needs to pee, but it is not urgent. She has been clamped for around 6 hours at this point and 500cc clear yellow urine immediately returned when unclamped. Will check again in one hour and document output.

## 2019-11-10 NOTE — Evaluation (Signed)
Foley catheter clamped for approximately 2 hours before patient began to feel need to urinate. She has been given her first dose of Flomax. Will continue to observe for a few more hours before d/c'ing foley. Will update MD and obtain order, as appropriate.

## 2019-11-10 NOTE — Progress Notes (Signed)
PROGRESS NOTE    Whitney Sloan  HYQ:[id number] DOB: January 06, 1956 DOA: 11/08/2019 PCP: Emelia Loron, NP   Brief Narrative:  HPI On 11/08/2019 by Dr. 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  Patient has been obtained from a combination of the patient and the daughter who is at the bedside.  Approximately 1 month ago, the patient began to experience bilateral lower extremity swelling.  The weeks that followed, as patient's bilateral lower extremity swelling began to worsen, patient also began to experience episodic shortness of breath.  The shortness of breath initially was primarily associated with pillow orthopnea and paroxysmal nocturnal dyspnea.  As the weeks continue to progress patient began to also develop severe dyspnea on exertion that was worse with exertion and improved with rest.  Patient also notes associated increasing abdominal girth and a weight gain of at least 10 pounds over the span of time.  Upon further questioning patient denies fever, cough, sick contacts, recent travel, chest pain or confirmed contact with COVID-19.  Patient eventually presented to [facility] emergency department for evaluation where patient was found to be in respiratory distress with markedly elevated blood pressures initially concerning for hypertensive crisis and acute pulmonary edema.  Patient was placed on BiPAP, given 80 mg of IV Lasix and initiated on nitroglycerin infusion all of which resulted in improvement in shortness of breath.  The hospitalist group was then called to assess the patient for admission to the hospital.  Assessment & Plan   Acute hypoxic respiratory failure with pulmonary edema -Patient required BiPAP on admission however has  now been transitioned to 4L nasal cannula -Patient is also noted to have hypertensive crisis, also suspect some underlying component of congestive heart failure -Patient was given IV Lasix, 80 mg on admission as well as a nitro drip -Continue IV Lasix twice daily -Echocardiogram as below -Attempt to wean supplemental oxygen, down to 2L  Acute diastolic CHF -Echocardiogram shows an EF of 50 to 95%, grade 2 diastolic dysfunction -Continue IV Lasix -Monitor intake and output, daily weights -Urine output over the past 24 hours 2175 cc -Have ordered CHF information  Hypertensive urgency -Patient did require nitroglycerin upon admission -Continue IV Lasix, amlodipine, lisinopril and monitor -BP is better controlled today  Diabetes mellitus, type II, uncontrolled with hyperglycemia -Continue lantus, insulin sliding scale -metformin and Victoza held -Hemoglobin A1c 10.1 -Have discussed the importance of controlling patient's blood sugar -Nutrition consulted  Hyperlipidemia -Continue statin  Insomnia -Patient states she has had insomnia for the past 2 to 3 years however did not speak to her primary care physician -Will place on Ambien as needed nightly  Urinary retention -Initially on admission, patient noted to have urinary retention.  Foley catheter placed to monitor intake and output -Will clamp catheter today, if patient does feel the urge to urinate, will then remove Foley -will place on flomax  Question OSA -Daughter and patient asked about possible use of CPAP -Explained to patient that she will need a sleep studyWhich is normally done as an outpatient  Deconditioning -PT consulted and recommended home health with 24-hour supervision   DVT Prophylaxis Lovenox  Code Status: Full  Family Communication: Daughter at bedside (all questions answered)  Disposition Plan:  Status is: Inpatient  Remains inpatient appropriate because:IV treatments appropriate due  to  intensity of illness or inability to take PO   Dispo: The patient is from: Home              Anticipated d/c is to: Home              Anticipated d/c date is: 2 days              Patient currently is not medically stable to d/c.   Consultants None  Procedures  Echocardiogram  Antibiotics   Anti-infectives (From admission, onward)   None      Subjective:   Whitney Sloan seen and examined today.  Patient feels her breathing has improved as compared to coming into the hospital.  Continues to feel some weakness.  Denies current chest pain, abdominal pain, nausea or vomiting, dizziness or headache.  Does complain of not being able to sleep well.  Has questions regarding using CPAP machine.  So wonders if she can have her Foley catheter removed.  Objective:   Vitals:   11/09/19 1642 11/09/19 2319 11/10/19 0300 11/10/19 0806  BP: 133/83 (!) 150/91  (!) 147/83  Pulse: (!) 107 (!) 102  (!) 106  Resp: 16 20  18   Temp: 98.6 F (37 C) 98.4 F (36.9 C)  97.9 F (36.6 C)  TempSrc:  Oral    SpO2: 94% 93%  93%  Weight:   116.5 kg   Height:        Intake/Output Summary (Last 24 hours) at 11/10/2019 1040 Last data filed at 11/10/2019 11/12/2019 Gross per 24 hour  Intake 600 ml  Output 2175 ml  Net -1575 ml   Filed Weights   11/09/19 0300 11/10/19 0300  Weight: 117.3 kg 116.5 kg   Exam  General: Well developed, well nourished, NAD, appears stated age  HEENT: NCAT, mucous membranes moist.   Cardiovascular: S1 S2 auscultated, RRR, no murmur  Respiratory: Diminished breath sounds, no wheezing  Abdomen: Soft, obese, nontender, nondistended, + bowel sounds  Extremities: warm dry without cyanosis clubbing or edema.  Callus on sole of right foot  Neuro: AAOx3, nonfocal  Psych: Appropriate mood and affect  Data Reviewed: I have personally reviewed following labs and imaging studies  CBC: Recent Labs  Lab 11/08/19 0115 11/08/19 0604 11/09/19 0442 11/10/19 0509  WBC 14.4*   --  15.7* 13.2*  NEUTROABS 6.2  --   --   --   HGB 13.3 13.3 12.5 12.3  HCT 43.1 39.0 39.6 39.7  MCV 87.8  --  86.3 86.9  PLT 338  --  311 297   Basic Metabolic Panel: Recent Labs  Lab 11/08/19 0115 11/08/19 0604 11/09/19 0442 11/10/19 0509  NA 140 140 142 142  K 3.7 3.8 3.5 3.6  CL 100  --  103 104  CO2 25  --  29 30  GLUCOSE 324*  --  170* 161*  BUN 13  --  19 17  CREATININE 0.88  --  0.73 0.66  CALCIUM 8.9  --  9.0 8.8*   GFR: Estimated Creatinine Clearance: 90.6 mL/min (by C-G formula based on SCr of 0.66 mg/dL). Liver Function Tests: Recent Labs  Lab 11/08/19 0115  AST 39  ALT 24  ALKPHOS 77  BILITOT 1.0  PROT 7.7  ALBUMIN 3.4*   No results for input(s): LIPASE, AMYLASE in the last 168 hours. No results for input(s): AMMONIA in the last 168 hours. Coagulation Profile: No results for input(s): INR, PROTIME in the last 168 hours. Cardiac  Enzymes: No results for input(s): CKTOTAL, CKMB, CKMBINDEX, TROPONINI in the last 168 hours. BNP (last 3 results) No results for input(s): PROBNP in the last 8760 hours. HbA1C: Recent Labs    11/08/19 0529  HGBA1C 10.1*   CBG: Recent Labs  Lab 11/09/19 0741 11/09/19 1212 11/09/19 1634 11/09/19 2104 11/10/19 0730  GLUCAP 146* 130* 136* 179* 144*   Lipid Profile: No results for input(s): CHOL, HDL, LDLCALC, TRIG, CHOLHDL, LDLDIRECT in the last 72 hours. Thyroid Function Tests: No results for input(s): TSH, T4TOTAL, FREET4, T3FREE, THYROIDAB in the last 72 hours. Anemia Panel: No results for input(s): VITAMINB12, FOLATE, FERRITIN, TIBC, IRON, RETICCTPCT in the last 72 hours. Urine analysis:    Component Value Date/Time   COLORURINE YELLOW 11/08/2019 0653   APPEARANCEUR CLEAR 11/08/2019 0653   LABSPEC 1.010 11/08/2019 0653   PHURINE 5.0 11/08/2019 0653   GLUCOSEU >=500 (A) 11/08/2019 0653   HGBUR MODERATE (A) 11/08/2019 0653   BILIRUBINUR NEGATIVE 11/08/2019 0653   KETONESUR NEGATIVE 11/08/2019 0653    PROTEINUR >=300 (A) 11/08/2019 0653   UROBILINOGEN 0.2 10/01/2016 1648   NITRITE NEGATIVE 11/08/2019 0653   LEUKOCYTESUR NEGATIVE 11/08/2019 0653   Sepsis Labs: @LABRCNTIP (procalcitonin:4,lacticidven:4)  ) Recent Results (from the past 240 hour(s))  SARS Coronavirus 2 by RT PCR (hospital order, performed in [facility] hospital lab) Nasopharyngeal Nasopharyngeal Swab     Status: None   Collection Time: 11/08/19  1:15 AM   Specimen: Nasopharyngeal Swab  Result Value Ref Range Status   SARS Coronavirus 2 NEGATIVE NEGATIVE Final    Comment: (NOTE) SARS-CoV-2 target nucleic acids are NOT DETECTED. The SARS-CoV-2 RNA is generally detectable in upper and lower respiratory specimens during the acute phase of infection. The lowest concentration of SARS-CoV-2 viral copies this assay can detect is 250 copies / mL. A negative result does not preclude SARS-CoV-2 infection and should not be used as the sole basis for treatment or other patient management decisions.  A negative result may occur with improper specimen collection / handling, submission of specimen other than nasopharyngeal swab, presence of viral mutation(s) within the areas targeted by this assay, and inadequate number of viral copies (<250 copies / mL). A negative result must be combined with clinical observations, patient history, and epidemiological information. Fact Sheet for Patients:   11/10/19 Fact Sheet for Healthcare Providers: BoilerBrush.com.cy This test is not yet approved or cleared  by the https://pope.com/ FDA and has been authorized for detection and/or diagnosis of SARS-CoV-2 by FDA under an Emergency Use Authorization (EUA).  This EUA will remain in effect (meaning this test can be used) for the duration of the COVID-19 declaration under Section 564(b)(1) of the Act, 21 U.S.C. section 360bbb-3(b)(1), unless the authorization is terminated or revoked  sooner. Performed at [facility], [address], [place], [place]       Radiology Studies: No results found.   Scheduled Meds: . amLODipine  10 mg Oral Daily  . aspirin EC  81 mg Oral Daily  . Chlorhexidine Gluconate Cloth  6 each Topical Daily  . enoxaparin (LOVENOX) injection  40 mg Subcutaneous Q24H  . furosemide  40 mg Intravenous BID  . insulin aspart  0-20 Units Subcutaneous TID AC & HS  . insulin aspart  16 Units Subcutaneous TID WC  . insulin glargine  50 Units Subcutaneous Daily  . lisinopril  40 mg Oral Daily  . multivitamin with minerals  1 tablet Oral Daily  . pneumococcal 23 valent vaccine  0.5 mL Intramuscular Tomorrow-1000  . simvastatin  20 mg Oral QHS  . sodium chloride flush  3 mL Intravenous Q12H   Continuous Infusions: . sodium chloride       LOS: 2 days   Time Spent in minutes   45 minutes (greater than 50% of time spent with patient face to face, as well as reviewing records, answering all questions, and formulating a plan)   Edsel Petrin D.O. on 11/10/2019 at 10:40 AM  Between 7am to 7pm - Please see pager noted on [email or web address]  After 7pm go to [email or web address]  And look for the night coverage person covering for me after hours  [facility] Office  [phone]

## 2019-11-10 NOTE — Progress Notes (Signed)
   11/10/19 1929  Assess: MEWS Score  ECG Heart Rate (!) 109  Resp (!) 26  Assess: MEWS Score  MEWS Temp 0  MEWS Systolic 0  MEWS Pulse 1  MEWS RR 2  MEWS LOC 0  MEWS Score 3  MEWS Score Color Yellow  Assess: if the MEWS score is Yellow or Red  Were vital signs taken at a resting state? Yes  Early Detection of Sepsis Score *See Row Information* Low  MEWS guidelines implemented *See Row Information* Yes  Treat  MEWS Interventions Other (Comment)  Take Vital Signs  Increase Vital Sign Frequency  Yellow: Q 2hr X 2 then Q 4hr X 2, if remains yellow, continue Q 4hrs  Escalate  MEWS: Escalate Yellow: discuss with charge nurse/RN and consider discussing with provider and RRT  Notify: Charge Nurse/RN  Name of Charge Nurse/RN Notified Mindy RN  Date Charge Nurse/RN Notified 11/10/19  Time Charge Nurse/RN Notified 1941  Document  Patient Outcome Other (Comment)  Progress note created (see row info) Yes

## 2019-11-11 ENCOUNTER — Inpatient Hospital Stay (HOSPITAL_COMMUNITY): Payer: Medicaid Other

## 2019-11-11 LAB — GLUCOSE, CAPILLARY
Glucose-Capillary: 166 mg/dL — ABNORMAL HIGH (ref 70–99)
Glucose-Capillary: 165 mg/dL — ABNORMAL HIGH (ref 70–99)
Glucose-Capillary: 103 mg/dL — ABNORMAL HIGH (ref 70–99)
Glucose-Capillary: 152 mg/dL — ABNORMAL HIGH (ref 70–99)

## 2019-11-11 LAB — BASIC METABOLIC PANEL
Anion gap: 13 (ref 5–15)
BUN: 22 mg/dL (ref 8–23)
CO2: 29 mmol/L (ref 22–32)
Calcium: 8.6 mg/dL — ABNORMAL LOW (ref 8.9–10.3)
Chloride: 99 mmol/L (ref 98–111)
Creatinine, Ser: 0.79 mg/dL (ref 0.44–1.00)
GFR calc Af Amer: >60 mL/min (ref >60)
GFR calc non Af Amer: >60 mL/min (ref >60)
Glucose, Bld: 238 mg/dL — ABNORMAL HIGH (ref 70–99)
Potassium: 3.5 mmol/L (ref 3.5–5.1)
Sodium: 141 mmol/L (ref 135–145)

## 2019-11-11 MED ORDER — FUROSEMIDE 10 MG/ML IJ SOLN
60.0000 mg | Freq: Two times a day (BID) | INTRAMUSCULAR | Status: DC
  Administered 2019-11-11 – 2019-11-15 (×8): 60 mg via INTRAVENOUS
  Filled 2019-11-11 (×8): qty 6

## 2019-11-11 MED ORDER — METOPROLOL TARTRATE 5 MG/5ML IV SOLN
5.0000 mg | Freq: Once | INTRAVENOUS | Status: AC
  Administered 2019-11-11: 5 mg via INTRAVENOUS
  Filled 2019-11-11: qty 5

## 2019-11-11 MED ORDER — LORAZEPAM 2 MG/ML IJ SOLN
1.0000 mg | Freq: Once | INTRAMUSCULAR | Status: AC
  Administered 2019-11-11: 1 mg via INTRAVENOUS
  Filled 2019-11-11: qty 1

## 2019-11-11 MED ORDER — IOHEXOL 350 MG/ML SOLN: 75.0000 mL | Freq: Once | INTRAVENOUS | Status: DC | PRN

## 2019-11-11 NOTE — TOC Initial Note (Addendum)
Transition of Care [facility]) - Initial/Assessment Note    Patient Details  Name: Whitney Sloan MRN: [id number] Date of Birth: Feb 01, 1956  [facility] CM/SW Contact:    Kirstie Peri, [place] Work Phone Number: 11/11/2019, 1:20 PM  Clinical Narrative:                 MSW Intern went to speak with pt about [facility] referral for discharge. Daughters, Conservation officer, nature, were at bedside. Brooke lives with pt and helps her. They live in an accessible apartment and pt has a shower chair and cane. Pt may need rollator as daughters say she does not have one. Pt has no preference for [facility] agency . [facility] contacted for PT and OT. unable to order DME, will order tomorrow. SW will continue to follow.  Addendum 11/11/2019 2:06 pm Georgina Snell from [place] advised MSW Intern that he is unable to take this pt due to insurance now. Will follow up with other options for [facility].  Expected Discharge Plan: [place] Barriers to Discharge: Continued Medical Work up   Patient Goals and CMS Choice Patient states their goals for this hospitalization and ongoing recovery are:: Pt and family are agreeable to going home with [facility]. CMS Medicare.gov Compare Post Acute Care list provided to:: Patient Choice offered to / list presented to : Patient  Expected Discharge Plan and Services Expected Discharge Plan: [place] In-house Referral: Clinical Social Work   Post Acute Care Choice: [place] arrangements for the past 2 months: Apartment                 DME Arranged: Walker rolling with seat         HH Arranged: PT, OT HH Agency: [place] Date [place]: 11/11/19 Time HH Agency Contacted: [phone] Representative spoke with at [place]: Georgina Snell  Prior Living Arrangements/Services Living arrangements for the past 2 months: [place] with:: Adult Children Patient language and need for interpreter reviewed:: Yes Do you feel safe going  back to the place where you live?: Yes      Need for Family Participation in Patient Care: Yes (Comment) Care giver support system in place?: Yes (comment)   Criminal Activity/Legal Involvement Pertinent to Current Situation/Hospitalization: No - Comment as needed  Activities of Daily Living Home Assistive Devices/Equipment: Cane (specify quad or straight) ADL Screening (condition at time of admission) Patient's cognitive ability adequate to safely complete daily activities?: Yes Is the patient deaf or have difficulty hearing?: No Does the patient have difficulty seeing, even when wearing glasses/contacts?: Yes Does the patient have difficulty concentrating, remembering, or making decisions?: No Patient able to express need for assistance with ADLs?: Yes Does the patient have difficulty dressing or bathing?: Yes Independently performs ADLs?: No Communication: Independent Dressing (OT): Independent Grooming: Independent Feeding: Independent Bathing: Independent Toileting: Independent In/Out Bed: Independent Walks in Home: Independent Does the patient have difficulty walking or climbing stairs?: Yes Weakness of Legs: Both Weakness of Arms/Hands: Both  Permission Sought/Granted   Permission granted to share information with : Yes, Verbal Permission Granted     Permission granted to share info w AGENCY: [facility]        Emotional Assessment Appearance:: Appears stated age Attitude/Demeanor/Rapport: Lethargic Affect (typically observed): Pleasant Orientation: : Oriented to Self, Oriented to Place, Oriented to  Time, Oriented to Situation Alcohol / Substance Use: Not Applicable Psych Involvement: No (comment)  Admission diagnosis:  Acute pulmonary  edema (HCC) [J81.0] Wound ballistics [W34.00XA] Acute respiratory failure with hypoxia (HCC) [J96.01] Hypertensive emergency [I16.1] Patient Active Problem List   Diagnosis Date Noted  . Acute cardiogenic pulmonary edema (HCC)  11/08/2019  . Acute respiratory failure with hypoxia (HCC) 11/08/2019  . Acute CHF (congestive heart failure) (HCC) 11/08/2019  . Hypertensive crisis 11/08/2019  . Mixed diabetic hyperlipidemia associated with type 2 diabetes mellitus (HCC) 11/08/2019  . Type 2 diabetes mellitus with diabetic polyneuropathy, with long-term current use of insulin (HCC) 08/08/2019  . Diabetes mellitus (HCC) 08/08/2019  . HYPERLIPIDEMIA 03/23/2010  . DECREASED HEARING, BILATERAL 02/05/2010  . MEMORY LOSS 02/05/2010  . INCONTINENCE 02/05/2010  . NEOPLASM, MALIGNANT, BREAST, HX OF 01/13/2010  . ANXIETY STATE, UNSPECIFIED 12/02/2009  . UNSTEADY GAIT 12/02/2009  . TOE PAIN 09/24/2009  . ANAL OR RECTAL PAIN 08/25/2009  . LEG EDEMA, BILATERAL 08/25/2009  . Uncontrolled type 2 diabetes mellitus with hyperglycemia, with long-term current use of insulin (HCC) 08/06/2009  . ESSENTIAL HYPERTENSION, BENIGN 08/06/2009  . DERMATITIS, ATOPIC 08/06/2009   PCP:  Carmel Sacramento, NP Pharmacy:   [id number] - [place], [place] - [address] AT [facility] [address] & [address] [place] [facility] [address] Phone: [phone] Fax: [phone]     Social Determinants of Health (SDOH) Interventions    Readmission Risk Interventions No flowsheet data found.

## 2019-11-11 NOTE — Progress Notes (Signed)
Patient bladder scanned as no urinary output since 1100 when foley was discontinued. Received 60mg  IV lasix at 1641. noted in bladder scan. MD made aware via secure text. Spoke with Dr. , received order to place indwelling foley catheter. Patient and daughter made aware of POC.

## 2019-11-11 NOTE — Evaluation (Signed)
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 This has progressively worsened over ~4 weeks PTA. Required BiPAP <24 hrs. Newly diagnosed CHF. Hypertensive urgency   Clinical Impression   PT admitted with SOB and new CHF. Pt currently with functional limitiations due to the deficits listed below (see OT problem list). Recommend next session to educate on energy conservation. Pt currently requires min (A) with transfer and cues for proper use of RW.  Pt will benefit from skilled OT to increase their independence and safety with adls and balance to allow discharge hhot to d/c to apartment with daughters .     Follow Up Recommendations  Home health OT    Equipment Recommendations  3 in 1 bedside commode;Other (comment)(RW)    Recommendations for Other Services       Precautions / Restrictions Precautions Precaution Comments: watch O2 sats and dyspnea      Mobility Bed Mobility               General bed mobility comments: oob in chair on arrival  Transfers Overall transfer level: Needs assistance Equipment used: 1 person hand held assist Transfers: Sit to/from Stand Sit to Stand: Min assist         General transfer comment: cues for hand placement and to power up to standing. pt static standing with RW supervision    Balance Overall balance assessment: Mild deficits observed, not formally tested                                         ADL either performed or assessed with clinical judgement   ADL Overall ADL's : Needs assistance/impaired Eating/Feeding: Modified  independent   Grooming: Supervision/safety;Oral care Grooming Details (indicate cue type and reason): pt able to tolerate static standing             Lower Body Dressing: Maximal assistance   Toilet Transfer: Min Nurse, learning disability Details (indicate cue type and reason): pt requires cues for positioning and to avoid sitting prematurely Toileting- Clothing Manipulation and Hygiene: Maximal assistance       Functional mobility during ADLs: Min guard;Rolling walker General ADL Comments: pt pushing RW too far ahead and cues to bring RW back. pt sitting prematurely throughout session and needed cues to position then sit     Vision         Perception     Praxis      Pertinent Vitals/Pain Pain Assessment: No/denies pain     Hand Dominance Right   Extremity/Trunk Assessment Upper Extremity Assessment Upper Extremity Assessment: Generalized weakness   Lower Extremity Assessment Lower Extremity Assessment: Defer to PT evaluation   Cervical / Trunk Assessment Cervical / Trunk Assessment: Other exceptions Cervical / Trunk Exceptions: body habitus   Communication Communication Communication: No difficulties   Cognition Arousal/Alertness: Awake/alert Behavior During Therapy: WFL for tasks assessed/performed Overall Cognitive Status: Within Functional Limits for tasks assessed  General Comments: a&ox4   General Comments  tolerated activity well and reports "i feel better moving"    Exercises     Shoulder Instructions      Home Living Family/patient expects to be discharged to:: Private residence Living Arrangements: Children Available Help at Discharge: Family;Available PRN/intermittently Type of Home: Apartment Home Access: Stairs to enter Entrance Stairs-Number of Steps: 1 Entrance Stairs-Rails: None Home Layout: One level     Bathroom Shower/Tub: Tub/shower unit;Curtain         Home Equipment:  Mining engineer - 4 wheels          Prior Functioning/Environment Level of Independence: Independent with assistive device(s)        Comments: uses quad cane at times; uses rollator to carry groceries from the car; daughter does the driving errands; assists with housework        OT Problem List: Decreased strength;Impaired balance (sitting and/or standing);Decreased safety awareness;Decreased knowledge of use of DME or AE;Decreased knowledge of precautions;Obesity      OT Treatment/Interventions: Self-care/ADL training;Therapeutic exercise;Energy conservation;DME and/or AE instruction;Therapeutic activities;Patient/family education;Balance training    OT Goals(Current goals can be found in the care plan section) Acute Rehab OT Goals Patient Stated Goal: to get warmer ( pt with air increased in temp during session to warm the room) OT Goal Formulation: With patient/family Time For Goal Achievement: 11/25/19 Potential to Achieve Goals: Good  OT Frequency: Min 2X/week   Barriers to D/C:            Co-evaluation              AM-PAC OT "6 Clicks" Daily Activity     Outcome Measure Help from another person eating meals?: None Help from another person taking care of personal grooming?: A Little Help from another person toileting, which includes using toliet, bedpan, or urinal?: A Lot Help from another person bathing (including washing, rinsing, drying)?: A Lot Help from another person to put on and taking off regular upper body clothing?: A Little Help from another person to put on and taking off regular lower body clothing?: A Lot 6 Click Score: 16   End of Session Equipment Utilized During Treatment: Rolling walker Nurse Communication: Mobility status;Precautions  Activity Tolerance: Patient tolerated treatment well Patient left: in chair;with call bell/phone within reach;with family/visitor present  OT Visit Diagnosis: Unsteadiness on feet (R26.81);Muscle  weakness (generalized) (M62.81)                Time: 1324-4010 OT Time Calculation (min): 26 min Charges:  OT General Charges $OT Visit: 1 Visit OT Evaluation $OT Eval Moderate Complexity: 1 Mod OT Treatments $Self Care/Home Management : 8-22 mins   Brynn, OTR/L  Acute Rehabilitation Services Pager: [phone] Office: [phone] .   Jeri Modena 11/11/2019, 2:09 PM

## 2019-11-11 NOTE — Progress Notes (Addendum)
PT Cancellation Note  Patient Details Name: Whitney Sloan MRN: [id number] DOB: 11/08/1955   Cancelled Treatment:    Reason Eval/Treat Not Completed: Fatigue/lethargy limiting ability to participate - pt very sleepy, barely rouses to PT entering room and talking with her. Pt states she mobilized with OT earlier, and declines further mobility this day. PT to check back.   Richrd Sox, PT Acute Rehabilitation Services Pager [phone]  Office [phone]    Tyrone Apple D Despina Hidden 11/11/2019, 2:30 PM

## 2019-11-11 NOTE — Progress Notes (Signed)
SATURATION QUALIFICATIONS: (This note is used to comply with regulatory documentation for home oxygen)  Patient Saturations on Room Air at Rest = 92%  Patient Saturations on Room Air while Ambulating = 93%  Patient Saturations on 1 Liters of oxygen while Ambulating = 93%  Please briefly explain why patient needs home oxygen: Patient does not appear to need oxygen. Saturations remained 93% while ambulating.

## 2019-11-11 NOTE — Consult Note (Signed)
WOC Nurse Consult Note: Patient receiving care in [facility] 2W04. Reason for Consult: "2 scabs on R foot" Wound type: There is a very tiny, 0.2 cm x 0.2cm dark spot on the lateral aspect of the right great toe, no drainage, no dressing necessary.  There is a callous on the plantar surface of the right foot adjacent to the metatarsal head of the 5th toe.  No drainage, no redness, no induration, no dressing necessary. Pressure Injury POA: Yes/No/NA Measurement: Wound bed: Drainage (amount, consistency, odor)  Periwound: Dressing procedure/placement/frequency: none at this time.  The patient might benefit from a podiatry appointment after discharge. Monitor the wound area(s) for worsening of condition such as: Signs/symptoms of infection,  Increase in size,  Development of or worsening of odor, Development of pain, or increased pain at the affected locations.  Notify the medical team if any of these develop.  Thank you for the consult.  Discussed plan of care with the patient.  WOC nurse will not follow at this time.  Please re-consult the WOC team if needed.  Helmut Muster, RN, MSN, CWOCN, CNS-BC, pager [phone]

## 2019-11-11 NOTE — Progress Notes (Signed)
PROGRESS NOTE    Whitney Sloan  TZG:[id number] DOB: 1956/04/11 DOA: 11/08/2019 PCP: Carmel Sacramento, NP   Brief Narrative:  HPI On 11/08/2019 by Dr. 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  Patient has been obtained from a combination of the patient and the daughter who is at the bedside.  Approximately 1 month ago, the patient began to experience bilateral lower extremity swelling.  The weeks that followed, as patient's bilateral lower extremity swelling began to worsen, patient also began to experience episodic shortness of breath.  The shortness of breath initially was primarily associated with pillow orthopnea and paroxysmal nocturnal dyspnea.  As the weeks continue to progress patient began to also develop severe dyspnea on exertion that was worse with exertion and improved with rest.  Patient also notes associated increasing abdominal girth and a weight gain of at least 10 pounds over the span of time.  Upon further questioning patient denies fever, cough, sick contacts, recent travel, chest pain or confirmed contact with COVID-19.  Patient eventually presented to [facility] emergency department for evaluation where patient was found to be in respiratory distress with markedly elevated blood pressures initially concerning for hypertensive crisis and acute pulmonary edema.  Patient was placed on BiPAP, given 80 mg of IV Lasix and initiated on nitroglycerin infusion all of which resulted in improvement in shortness of breath.  The hospitalist group was then called to assess the patient for admission to the hospital.  Interim history Patient mated with acute hypoxic respiratory failure and pulmonary edema.  Found to have diastolic heart  failure, placed on IV Lasix. Also has urinary retention, attempt to remove Foley catheter today and monitor. Assessment & Plan   Acute hypoxic respiratory failure with pulmonary edema -Patient required BiPAP on admission however has now been transitioned to 4L nasal cannula -Patient is also noted to have hypertensive crisis, also suspect some underlying component of congestive heart failure -Patient was given IV Lasix, 80 mg on admission as well as a nitro drip -Continue IV Lasix twice daily -Echocardiogram as below -Attempt to wean supplemental oxygen, down to 1-2L -Have ordered home desaturation test  Acute diastolic CHF -Echocardiogram shows an EF of 50 to 55%, grade 2 diastolic dysfunction -Continue IV Lasix -Monitor intake and output, daily weights -Urine output over the past 24 hours 1000 cc -Have ordered CHF information  Hypertensive urgency -Patient did require nitroglycerin upon admission -Continue IV Lasix, amlodipine, lisinopril and monitor -BP is better controlled today  Diabetes mellitus, type II, uncontrolled with hyperglycemia -Continue lantus, insulin sliding scale -metformin and Victoza held -Hemoglobin A1c 10.1 -Have discussed the importance of controlling patient's blood sugar -Nutrition consulted  Hyperlipidemia -Continue statin  Insomnia -Patient states she has had insomnia for the past 2 to 3 years however did not speak to her primary care physician -Continue Ambien QHS PRN  Urinary retention -Initially on admission, patient noted to have urinary retention.  Foley catheter placed to monitor intake and output -Will attempt to remove Foley catheter today -Continue Flomax  Question OSA -Daughter and patient asked about possible use of CPAP -Explained to patient that she will need a sleep studyWhich is normally done as an outpatient  Deconditioning -PT consulted and recommended home health with 24-hour supervision -OT consulted and pending  Right  foot callus -X-ray obtained: soft tissue swelling over dorsum of the foot without underlying fracture -Wound care consulted  DVT Prophylaxis Lovenox  Code Status: Full  Family Communication: Daughter at bedside (all questions answered)  Disposition Plan:  Status is: Inpatient  Remains inpatient appropriate because:IV treatments appropriate due to intensity of illness or inability to take PO   Dispo: The patient is from: Home              Anticipated d/c is to: Home              Anticipated d/c date is: 2 days              Patient currently is not medically stable to d/c.   Consultants None  Procedures  Echocardiogram  Antibiotics   Anti-infectives (From admission, onward)   None      Subjective:   Whitney Sloan seen and examined today.  Feels her breathing improves as she is sitting up in bed.  Wants to know she will be able to get the Foley catheter removed.  Does have some weakness and tiredness although states that she was able to sleep well overnight.  Denies current chest pain, abdominal pain, nausea or vomiting, dizziness or headache.    Objective:   Vitals:   11/11/19 0508 11/11/19 0543 11/11/19 0544 11/11/19 0741  BP: 123/60 138/72  131/75  Pulse: (!) 119 (!) 107  (!) 114  Resp: (!) 29 (!) 32 (!) 31 18  Temp: 97.8 F (36.6 C) 97.8 F (36.6 C)  98.4 F (36.9 C)  TempSrc: Oral Oral    SpO2: 96% 93%  93%  Weight:      Height:        Intake/Output Summary (Last 24 hours) at 11/11/2019 1037 Last data filed at 11/11/2019 0946 Gross per 24 hour  Intake --  Output 1700 ml  Net -1700 ml   Filed Weights   11/09/19 0300 11/10/19 0300 11/11/19 0352  Weight: 117.3 kg 116.5 kg 117.6 kg   Exam  General: Well developed, well nourished, NAD, appears stated age  HEENT: NCAT, mucous membranes moist.  Cardiovascular: S1 S2 auscultated, RRR, no murmur  Respiratory: Diminished breath sounds however clear.  No wheezing  Abdomen: Soft, obese, nontender,  nondistended, + bowel sounds  Extremities: warm dry without cyanosis clubbing. + LE edema.  Callus on sole of right foot  Neuro: AAOx3, nonfocal  Psych: Appropriate mood and affect   Data Reviewed: I have personally reviewed following labs and imaging studies  CBC: Recent Labs  Lab 11/08/19 0115 11/08/19 0604 11/09/19 0442 11/10/19 0509  WBC 14.4*  --  15.7* 13.2*  NEUTROABS 6.2  --   --   --   HGB 13.3 13.3 12.5 12.3  HCT 43.1 39.0 39.6 39.7  MCV 87.8  --  86.3 86.9  PLT 338  --  311 009   Basic Metabolic Panel: Recent Labs  Lab 11/08/19 0115 11/08/19 0604 11/09/19 0442 11/10/19 0509 11/11/19 0323  NA 140 140 142 142 141  K 3.7 3.8 3.5 3.6 3.5  CL 100  --  103 104 99  CO2 25  --  29 30 29   GLUCOSE 324*  --  170* 161* 238*  BUN 13  --  19 17 22   CREATININE 0.88  --  0.73 0.66 0.79  CALCIUM 8.9  --  9.0 8.8* 8.6*   GFR: Estimated Creatinine Clearance: 91.1 mL/min (by C-G formula based on SCr of 0.79 mg/dL). Liver  Function Tests: Recent Labs  Lab 11/08/19 0115  AST 39  ALT 24  ALKPHOS 77  BILITOT 1.0  PROT 7.7  ALBUMIN 3.4*   No results for input(s): LIPASE, AMYLASE in the last 168 hours. No results for input(s): AMMONIA in the last 168 hours. Coagulation Profile: No results for input(s): INR, PROTIME in the last 168 hours. Cardiac Enzymes: No results for input(s): CKTOTAL, CKMB, CKMBINDEX, TROPONINI in the last 168 hours. BNP (last 3 results) No results for input(s): PROBNP in the last 8760 hours. HbA1C: No results for input(s): HGBA1C in the last 72 hours. CBG: Recent Labs  Lab 11/09/19 2104 11/10/19 0730 11/10/19 1541 11/10/19 2110 11/11/19 0740  GLUCAP 179* 144* 141* 121* 166*   Lipid Profile: No results for input(s): CHOL, HDL, LDLCALC, TRIG, CHOLHDL, LDLDIRECT in the last 72 hours. Thyroid Function Tests: No results for input(s): TSH, T4TOTAL, FREET4, T3FREE, THYROIDAB in the last 72 hours. Anemia Panel: No results for input(s):  VITAMINB12, FOLATE, FERRITIN, TIBC, IRON, RETICCTPCT in the last 72 hours. Urine analysis:    Component Value Date/Time   COLORURINE YELLOW 11/08/2019 0653   APPEARANCEUR CLEAR 11/08/2019 0653   LABSPEC 1.010 11/08/2019 0653   PHURINE 5.0 11/08/2019 0653   GLUCOSEU >=500 (A) 11/08/2019 0653   HGBUR MODERATE (A) 11/08/2019 0653   BILIRUBINUR NEGATIVE 11/08/2019 0653   KETONESUR NEGATIVE 11/08/2019 0653   PROTEINUR >=300 (A) 11/08/2019 0653   UROBILINOGEN 0.2 10/01/2016 1648   NITRITE NEGATIVE 11/08/2019 0653   LEUKOCYTESUR NEGATIVE 11/08/2019 0653   Sepsis Labs: @LABRCNTIP (procalcitonin:4,lacticidven:4)  ) Recent Results (from the past 240 hour(s))  SARS Coronavirus 2 by RT PCR (hospital order, performed in [facility] hospital lab) Nasopharyngeal Nasopharyngeal Swab     Status: None   Collection Time: 11/08/19  1:15 AM   Specimen: Nasopharyngeal Swab  Result Value Ref Range Status   SARS Coronavirus 2 NEGATIVE NEGATIVE Final    Comment: (NOTE) SARS-CoV-2 target nucleic acids are NOT DETECTED. The SARS-CoV-2 RNA is generally detectable in upper and lower respiratory specimens during the acute phase of infection. The lowest concentration of SARS-CoV-2 viral copies this assay can detect is 250 copies / mL. A negative result does not preclude SARS-CoV-2 infection and should not be used as the sole basis for treatment or other patient management decisions.  A negative result may occur with improper specimen collection / handling, submission of specimen other than nasopharyngeal swab, presence of viral mutation(s) within the areas targeted by this assay, and inadequate number of viral copies (<250 copies / mL). A negative result must be combined with clinical observations, patient history, and epidemiological information. Fact Sheet for Patients:   11/10/19 Fact Sheet for Healthcare Providers: BoilerBrush.com.cy This test  is not yet approved or cleared  by the https://pope.com/ FDA and has been authorized for detection and/or diagnosis of SARS-CoV-2 by FDA under an Emergency Use Authorization (EUA).  This EUA will remain in effect (meaning this test can be used) for the duration of the COVID-19 declaration under Section 564(b)(1) of the Act, 21 U.S.C. section 360bbb-3(b)(1), unless the authorization is terminated or revoked sooner. Performed at [facility], [address] [place], [place]       Radiology Studies: No results found.   Scheduled Meds: . amLODipine  10 mg Oral Daily  . aspirin EC  81 mg Oral Daily  . Chlorhexidine Gluconate Cloth  6 each Topical Daily  . enoxaparin (LOVENOX) injection  40 mg Subcutaneous Q24H  .  furosemide  40 mg Intravenous BID  . insulin aspart  0-20 Units Subcutaneous TID AC & HS  . insulin aspart  16 Units Subcutaneous TID WC  . insulin glargine  50 Units Subcutaneous Daily  . lisinopril  40 mg Oral Daily  . multivitamin with minerals  1 tablet Oral Daily  . pneumococcal 23 valent vaccine  0.5 mL Intramuscular Tomorrow-1000  . simvastatin  20 mg Oral QHS  . sodium chloride flush  3 mL Intravenous Q12H  . tamsulosin  0.4 mg Oral Daily   Continuous Infusions: . sodium chloride       LOS: 3 days   Time Spent in minutes   45 minutes (greater than 50% of time spent with patient face to face, as well as reviewing records, answering all questions, and formulating a plan)   Edsel Petrin D.O. on 11/11/2019 at 10:37 AM  Between 7am to 7pm - Please see pager noted on [email or web address]  After 7pm go to [email or web address]  And look for the night coverage person covering for me after hours  [facility] Office  [phone]

## 2019-11-11 NOTE — Progress Notes (Addendum)
Received page that patient received 60 IV lasix and has not voided since Foley removal at 1100 despite multiple attempts. Bladder scan with 481 mL. Will replace Foley at this time to avoid multiple straight caths.   Received another page that patient was tachycardic with fever. RN to give Tylenol. Blood cx, UA, CBC and Lactate ordered. Reviewed CXR from earlier today. Will cont to monitor. No IVF at this time as patient is being diuresed.   Jerilynn Birkenhead, MD [facility]

## 2019-11-12 ENCOUNTER — Inpatient Hospital Stay (HOSPITAL_COMMUNITY): Payer: Medicaid Other

## 2019-11-12 DIAGNOSIS — J9601 Acute respiratory failure with hypoxia: Secondary | ICD-10-CM

## 2019-11-12 DIAGNOSIS — I5031 Acute diastolic (congestive) heart failure: Secondary | ICD-10-CM

## 2019-11-12 DIAGNOSIS — I160 Hypertensive urgency: Secondary | ICD-10-CM

## 2019-11-12 LAB — GLUCOSE, CAPILLARY
Glucose-Capillary: 124 mg/dL — ABNORMAL HIGH (ref 70–99)
Glucose-Capillary: 132 mg/dL — ABNORMAL HIGH (ref 70–99)
Glucose-Capillary: 200 mg/dL — ABNORMAL HIGH (ref 70–99)
Glucose-Capillary: 171 mg/dL — ABNORMAL HIGH (ref 70–99)
Glucose-Capillary: 200 mg/dL — ABNORMAL HIGH (ref 70–99)

## 2019-11-12 LAB — CBC
HCT: 37.8 % (ref 36.0–46.0)
Hemoglobin: 12 g/dL (ref 12.0–15.0)
MCH: 27 pg (ref 26.0–34.0)
MCHC: 31.7 g/dL (ref 30.0–36.0)
MCV: 84.9 fL (ref 80.0–100.0)
Platelets: 255 10*3/uL (ref 150–400)
RBC: 4.45 MIL/uL (ref 3.87–5.11)
RDW: 14.5 % (ref 11.5–15.5)
WBC: 11.3 10*3/uL — ABNORMAL HIGH (ref 4.0–10.5)
nRBC: 0 % (ref 0.0–0.2)

## 2019-11-12 LAB — BASIC METABOLIC PANEL
Anion gap: 7 (ref 5–15)
BUN: 17 mg/dL (ref 8–23)
CO2: 29 mmol/L (ref 22–32)
Calcium: 8.4 mg/dL — ABNORMAL LOW (ref 8.9–10.3)
Chloride: 103 mmol/L (ref 98–111)
Creatinine, Ser: 0.72 mg/dL (ref 0.44–1.00)
GFR calc Af Amer: >60 mL/min (ref >60)
GFR calc non Af Amer: >60 mL/min (ref >60)
Glucose, Bld: 134 mg/dL — ABNORMAL HIGH (ref 70–99)
Potassium: 3.5 mmol/L (ref 3.5–5.1)
Sodium: 139 mmol/L (ref 135–145)

## 2019-11-12 LAB — URINALYSIS, ROUTINE W REFLEX MICROSCOPIC
Bilirubin Urine: NEGATIVE
Glucose, UA: NEGATIVE mg/dL
Ketones, ur: NEGATIVE mg/dL
Leukocytes,Ua: NEGATIVE
Nitrite: NEGATIVE
Protein, ur: 100 mg/dL — AB
RBC / HPF: >50 RBC/hpf — ABNORMAL HIGH (ref 0–5)
Specific Gravity, Urine: 1.018 (ref 1.005–1.030)
pH: 5 (ref 5.0–8.0)

## 2019-11-12 LAB — MAGNESIUM: Magnesium: 2 mg/dL (ref 1.7–2.4)

## 2019-11-12 LAB — LACTIC ACID, PLASMA: Lactic Acid, Venous: 0.8 mmol/L (ref 0.5–1.9)

## 2019-11-12 MED ORDER — CALCIUM POLYCARBOPHIL 625 MG PO TABS
625.0000 mg | ORAL_TABLET | Freq: Every day | ORAL | Status: DC
  Administered 2019-11-12 – 2019-11-15 (×4): 625 mg via ORAL
  Filled 2019-11-12 (×4): qty 1

## 2019-11-12 MED ORDER — BISACODYL 10 MG RE SUPP
10.0000 mg | Freq: Once | RECTAL | Status: DC
  Filled 2019-11-12: qty 1

## 2019-11-12 MED ORDER — POLYETHYLENE GLYCOL 3350 17 G PO PACK
17.0000 g | PACK | Freq: Every day | ORAL | Status: DC
  Administered 2019-11-12 – 2019-11-14 (×3): 17 g via ORAL
  Filled 2019-11-12 (×4): qty 1

## 2019-11-12 NOTE — Consult Note (Addendum)
Cardiology Consultation:   Patient ID: Whitney Sloan MRN: [id number]; DOB: 1956/03/02  Admit date: 11/08/2019 Date of Consult: 11/12/2019  Primary Care Provider: Carmel [place], NP Primary Cardiologist: New Primary Electrophysiologist:  None    Patient Profile:   Whitney Sloan is a 64 y.o. 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 Catha Gosselin.  History of Present Illness:   [name] has not been seen by cardiology in the past. Echo in 2010 showed EF 40-45% with diffuse hypokinesis, mildly dilated LA. No history of MI, stent, arrhythmia. She lives with her daughter and is able to take care of herself. No tobacco, alcohol, drug use.   The patient presented to the ED 11/08/19 for shortness of breath. Symptoms started 1 month ago and acutely worse over the last week. Also noted lower leg edema which had been worsening over the last month. Shortness of breath also worsened and patient started experiencing PND and orthopnea. Then she starting having severe sob on exertion. Also not abdominal firmness and 10 lbs weight gain. Denies recent fever, cough, sick contacts. No chest pain.  In the ED the patient was in respiratory distress started on Bipap and given IV lasix 80 mg. BP 188/106, pulse 133, afebrile, RR 36. Nitro infusion started for elevated BP. Labs showed WBC 14.4, BNP 132, glucose 324, albumin 3.4. HS troponin 19>34. EKG showed sinus tachycardia with nonspecific T wave abnormality in lateral leads. CXR showed cardiomegaly with pulmonary vascular congestion without overt edema. Patient was admitted for further work-up.    Past Medical History:  Diagnosis Date  . Diabetes mellitus   . Hyperlipemia   . Hypertension   . Psoriasis     Past Surgical History:  Procedure Laterality Date  . ANAL FISSURE REPAIR    . ENDOMETRIAL ABLATION       Home Medications:  Prior to  Admission medications   Medication Sig Start Date End Date Taking? Authorizing Provider  amLODipine-benazepril (LOTREL) 10-40 MG capsule Take 1 capsule by mouth daily. 10/17/19  Yes [provider]  aspirin (ASPIR-LOW) 81 MG EC tablet Take 81 mg by mouth every morning.    Yes [provider]  Cholecalciferol (VITAMIN D PO) Take 1 capsule by mouth daily.   Yes [provider]  ezetimibe (ZETIA) 10 MG tablet Take 10 mg by mouth at bedtime. 08/21/19  Yes [provider]  furosemide (LASIX) 20 MG tablet Take 20 mg by mouth 2 (two) times daily as needed. 11/01/19  Yes [provider]  ibuprofen (ADVIL) 200 MG tablet Take 200 mg by mouth every 6 (six) hours as needed for fever, headache or moderate pain.   Yes [provider]  insulin aspart (NOVOLOG FLEXPEN) 100 UNIT/ML FlexPen Inject 16 Units into the skin 3 (three) times daily with meals. 08/07/19  Yes Shamleffer, Konrad Dolores, MD  insulin glargine (LANTUS) 100 UNIT/ML injection Inject 0.5 mLs (50 Units total) into the skin at bedtime. E11.65 10/01/19  Yes Shamleffer, Konrad Dolores, MD  liraglutide (VICTOZA) 18 MG/3ML SOPN Inject 1.8 mg into the skin daily.   Yes [provider]  metFORMIN (GLUCOPHAGE) 500 MG tablet Take 1,000 mg by mouth 2 (two) times daily.  10/21/19  Yes [provider]  amLODipine (NORVASC) 10 MG tablet Take 1 tablet (10 mg total) by mouth daily. For high blood pressure Patient not taking: Reported  on 11/08/2019 05/30/12   Andrena Mews, DO  lisinopril (PRINIVIL,ZESTRIL) 40 MG tablet Take 1 tablet (40 mg total) by mouth daily. Patient not taking: Reported on 11/08/2019 04/02/12   Andrena Mews, DO  metFORMIN (GLUCOPHAGE) 1000 MG tablet Take 1 tablet (1,000 mg total) by mouth 2 (two) times daily. Patient not taking: Reported on 11/08/2019 05/30/12   Andrena Mews, DO  nystatin (MYCOSTATIN/NYSTOP) powder Apply topically 4 (four) times daily. Patient not  taking: Reported on 11/08/2019 07/22/16   Deatra Canter, FNP  nystatin cream (MYCOSTATIN) Apply to affected area 2 times daily Patient not taking: Reported on 11/08/2019 07/22/16   Deatra Canter, FNP  simvastatin (ZOCOR) 20 MG tablet Take 1 tablet (20 mg total) by mouth at bedtime. Patient not taking: Reported on 11/08/2019 05/30/12   Andrena Mews, DO  UNIFINE PENTIPS 31G X 6 MM MISC USE TO INJECT INSULIN 03/21/11   Edd Arbour, MD    Inpatient Medications: Scheduled Meds: . amLODipine  10 mg Oral Daily  . aspirin EC  81 mg Oral Daily  . bisacodyl  10 mg Rectal Once  . Chlorhexidine Gluconate Cloth  6 each Topical Daily  . enoxaparin (LOVENOX) injection  40 mg Subcutaneous Q24H  . furosemide  60 mg Intravenous BID  . insulin aspart  0-20 Units Subcutaneous TID AC & HS  . insulin aspart  16 Units Subcutaneous TID WC  . insulin glargine  50 Units Subcutaneous Daily  . lisinopril  40 mg Oral Daily  . multivitamin with minerals  1 tablet Oral Daily  . pneumococcal 23 valent vaccine  0.5 mL Intramuscular Tomorrow-1000  . polycarbophil  625 mg Oral Daily  . polyethylene glycol  17 g Oral Daily  . simvastatin  20 mg Oral QHS  . sodium chloride flush  3 mL Intravenous Q12H  . tamsulosin  0.4 mg Oral Daily   Continuous Infusions: . sodium chloride     PRN Meds: sodium chloride, acetaminophen, albuterol, bisacodyl, hydrALAZINE, iohexol, ondansetron (ZOFRAN) IV, sodium chloride flush, zolpidem  Allergies:    Allergies  Allergen Reactions  . Oxycodone-Acetaminophen Nausea And Vomiting    Social History:   Social History   Socioeconomic History  . Marital status: Divorced    Spouse name: Not on file  . Number of children: Not on file  . Years of education: Not on file  . Highest education level: Not on file  Occupational History  . Not on file  Tobacco Use  . Smoking status: Never Smoker  . Smokeless tobacco: Never Used  Substance and Sexual Activity  . Alcohol use:  No  . Drug use: No  . Sexual activity: Not on file  Other Topics Concern  . Not on file  Social History Narrative  . Not on file   Social Determinants of Health   Financial Resource Strain:   . Difficulty of Paying Living Expenses:   Food Insecurity:   . Worried About Programme researcher, broadcasting/film/video in the Last Year:   . Barista in the Last Year:   Transportation Needs:   . Freight forwarder (Medical):   Marland Kitchen Lack of Transportation (Non-Medical):   Physical Activity:   . Days of Exercise per Week:   . Minutes of Exercise per Session:   Stress:   . Feeling of Stress :   Social Connections:   . Frequency of Communication with Friends and Family:   . Frequency of Social Gatherings with Friends and  Family:   . Attends Religious Services:   . Active Member of Clubs or Organizations:   . Attends Archivist Meetings:   Marland Kitchen Marital Status:   Intimate Partner Violence:   . Fear of Current or Ex-Partner:   . Emotionally Abused:   Marland Kitchen Physically Abused:   . Sexually Abused:     Family History:   Family History  Problem Relation Age of Onset  . Dementia Mother   . Renal Disease Father   . Diabetes Other      ROS:  Please see the history of present illness.  All other ROS reviewed and negative.     Physical Exam/Data:   Vitals:   11/12/19 0300 11/12/19 0800 11/12/19 0804 11/12/19 1141  BP:   (!) 149/95   Pulse:   (!) 110   Resp:  (!) 29 16   Temp:   98.9 F (37.2 C)   TempSrc:      SpO2:   94% 93%  Weight: 115.4 kg     Height:        Intake/Output Summary (Last 24 hours) at 11/12/2019 1334 Last data filed at 11/12/2019 0604 Gross per 24 hour  Intake 240 ml  Output 1000 ml  Net -760 ml   Last 3 Weights 11/12/2019 11/11/2019 11/10/2019  Weight (lbs) 254 lb 6.6 oz 259 lb 4.2 oz 256 lb 13.4 oz  Weight (kg) 115.4 kg 117.6 kg 116.5 kg     Body mass index is 42.34 kg/m.  General:  Well nourished, well developed, in no acute distress HEENT: normal Lymph: no  adenopathy Neck: + JVD Endocrine:  No thryomegaly Vascular: No carotid bruits; FA pulses 2+ bilaterally without bruits  Cardiac:  normal S1, S2; RRR; tachycardic, no murmur  Lungs:  clear to auscultation bilaterally, no wheezing, rhonchi or rales  Abd: soft, nontender, no hepatomegaly  Ext: 1-2+ lower leg edema Musculoskeletal:  No deformities, BUE and BLE strength normal and equal Skin: warm and dry  Neuro:  CNs 2-12 intact, no focal abnormalities noted Psych:  Normal affect   EKG:  The EKG was personally reviewed and demonstrates:  Sinus tachycardia, T wave changes lateral leads, 117bpm Telemetry:  Telemetry was personally reviewed and demonstrates:  Sinus tachy, HR 110-120, PVCs  Relevant CV Studies:  Echo 11/08/19 1. Left ventricular ejection fraction, by estimation, is 50 to 55%. The  left ventricle has low normal function. The left ventricle demonstrates  global hypokinesis. Left ventricular diastolic parameters are consistent  with Grade II diastolic dysfunction  (pseudonormalization). Elevated left ventricular end-diastolic pressure.  2. Right ventricular systolic function is normal. The right ventricular  size is normal.  3. The mitral valve is abnormal. Trivial mitral valve regurgitation.  4. The aortic valve is tricuspid. Aortic valve regurgitation is not  visualized. Mild aortic valve sclerosis is present, with no evidence of  aortic valve stenosis.  5. The inferior vena cava is dilated in size with >50% respiratory  variability, suggesting right atrial pressure of 8 mmHg.     Laboratory Data:  High Sensitivity Troponin:   Recent Labs  Lab 11/08/19 0115 11/08/19 0326  TROPONINIHS 19* 34*     Chemistry Recent Labs  Lab 11/10/19 0509 11/11/19 0323 11/12/19 0041  NA 142 141 139  K 3.6 3.5 3.5  CL 104 99 103  CO2 30 29 29   GLUCOSE 161* 238* 134*  BUN 17 22 17   CREATININE 0.66 0.79 0.72  CALCIUM 8.8* 8.6* 8.4*  GFRNONAA >60 >60 >  60  GFRAA >60 >60  >60  ANIONGAP 8 13 7     Recent Labs  Lab 11/08/19 0115  PROT 7.7  ALBUMIN 3.4*  AST 39  ALT 24  ALKPHOS 77  BILITOT 1.0   Hematology Recent Labs  Lab 11/09/19 0442 11/10/19 0509 11/12/19 0041  WBC 15.7* 13.2* 11.3*  RBC 4.59 4.57 4.45  HGB 12.5 12.3 12.0  HCT 39.6 39.7 37.8  MCV 86.3 86.9 84.9  MCH 27.2 26.9 27.0  MCHC 31.6 31.0 31.7  RDW 14.9 14.8 14.5  PLT 311 297 255   BNP Recent Labs  Lab 11/08/19 0115  BNP 132.6*    DDimer No results for input(s): DDIMER in the last 168 hours.   Radiology/Studies:  DG Abd 1 View  Result Date: 11/12/2019 CLINICAL DATA:  Abdominal pain.  Constipation. EXAM: ABDOMEN - 1 VIEW COMPARISON:  None. FINDINGS: Questionable punctate 3 mm calcification over right kidney suggesting right nephrolithiasis. Large amount of stool noted throughout the colon suggesting constipation. Several slightly prominent air-filled loops of small bowel noted. Follow-up exam suggested to demonstrate resolution. Degenerative changes lumbar spine and both hips. IMPRESSION: 1.  Questionable 3 mm stone right kidney. 2. Large amount of stool noted throughout the colon suggesting constipation. Several slightly prominent air-filled loops of small bowel noted. Follow-up exam suggested to demonstrate resolution. Electronically Signed   By: 01/12/2020  Register   On: 11/12/2019 09:40   DG CHEST PORT 1 VIEW  Result Date: 11/11/2019 CLINICAL DATA:  Dyspnea EXAM: PORTABLE CHEST 1 VIEW COMPARISON:  11/08/2019 chest radiograph. FINDINGS: Stable cardiomediastinal silhouette with mild cardiomegaly. No pneumothorax. No significant pleural effusions. Mild pulmonary edema, similar. IMPRESSION: Stable mild congestive heart failure. Electronically Signed   By: 11/10/2019 M.D.   On: 11/11/2019 11:20   {  Assessment and Plan:   Acute diastolic heart failure - presented with progressive son, LLE, orthopnea, PND for 1  Month - BNP 132. CXR with vascular congestion - Echo showed EF  50-55% with G2DD, trivial MR,elevated LVEDP - On IV lasix 60 mg BID - Patient put out 2.4L urine overnight, net -5.8L since admission - weight down 4 lbs - creatinine stable - continue with diuresis. Monitor strict I/Os, daily weights, and creatinine with diuresis  Acute respiratory failure with pulmonary edema - likely related to the above - was on Bipap and nitro infusion in the ED, now on 4L O2 - CXR 5/31 showed stable CHF  Hypertensive urgency - nitro infusion in the ED - amlodipine 10 mg daily, lisinopril 40 mg daily, IV lasix - Pressures improved  HLD - simvastatin 20 mg daily - check lipid panel  Possible SIRS  - Low grade fever this AM and tachycardia around 100 bpm - no leukocytosis - CXR with CHF - KUB with constipation - UA with rare bacteria, HGB, no nitrites or leukocytes - Lactic acid normal - Blood cultures pending  Otherwise per IM - Urinary retention - DM2 - Rt foot callus - constipation   For questions or updates, please contact [facility] Please consult [email or web address] for contact info under     Signed, Cadence 6/31, PA-C  11/12/2019 1:34 PM   Patient seen and examined.  Agree as above documentation.  Ms. Schenk is a 64 year old female with a history of T2DM, hypertension, hyperlipidemia, obesity, CVA in 2011 who is being seen today for evaluation of diastolic heart failure at the request of Dr. 2012.  She reports worsening shortness of breath over the past month.  Also with worsening lower extremity edema.  Prompted her to present to the ED on 11/08/2019.  She was found to be in respiratory distress in the ED and started on BiPAP.  Initial BP 188/106, was started on nitro drip.  Labs notable for BNP 132, HS troponin 19 >34, albumin 3.4, creatinine 0.9.  TTE shows LVEF 50 to 55%, grade 2 diastolic dysfunction, normal RV function, no significant valvular disease, estimated RAP 8 mmHg.  She has been diuresed with IV Lasix, net -7 L on admission.  She  is on amlodipine and lisinopril, BP has normalized.  EKG shows sinus tachycardia, rate 117, subtle T wave inversions in V5/6, I, aVL.  Telemetry shows normal sinus rhythm with rate 110s to 120s.  On exam, patient is alert and oriented, regular and tachycardic, no murmurs, lungs with diminished breath sounds at bases, 2+ BLE edema.   For her acute diastolic heart failure, she appears to be improving.  Diuresing well on IV Lasix 60 mg twice daily.  She appears volume overloaded on exam, would continue IV lasix.  BP has normalized, would continue amlodipine 10 mg daily and lisinopril 40 mg daily.  Unclear cause of persistent tachycardia, but did spike a fever last night.  Agree with infectious work-up.  Little Ishikawa, MD

## 2019-11-12 NOTE — Progress Notes (Signed)
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 This has progressively worsened over ~4 weeks PTA. Required BiPAP <24 hrs. Newly diagnosed CHF. Hypertensive urgency    PT Comments    Patient received in recliner, agrees to PT session. Reports her bottom is hurting in the chair.  Patient requires cues for hand placement with sit to stand from recliner. Requires min guard and rw for ambulation of 100-110 feet. Ambulated on 1 lpm O2 and sat remained > 90%. She will continue to benefit from skilled PT while here to improve strength and functional independence for safe return home at discharge.      Follow Up Recommendations  Home health PT;Supervision/Assistance - 24 hour     Equipment Recommendations  None recommended by PT    Recommendations for Other Services       Precautions / Restrictions Precautions Precautions: Fall Precaution Comments: watch O2 sats and dyspnea Restrictions Weight Bearing Restrictions: No    Mobility  Bed Mobility               General bed mobility comments: oob in recliner upon arrival  Transfers Overall transfer level: Needs assistance Equipment used: Rolling walker (2 wheeled) Transfers: Sit to/from Stand Sit to Stand: Min guard         General transfer comment: cues for hand placement neede  Ambulation/Gait Ambulation/Gait assistance: Min guard Gait Distance (Feet): 100 Feet Assistive device: Rolling walker (2 wheeled) Gait Pattern/deviations: Step-through pattern;Decreased stride length;Decreased step length - right;Decreased step length - left Gait  velocity: decr   General Gait Details: slow, steady ambulation with RW. HR up to 130 with ambulation, O2 sats remained > 90% throughout on 1 lpm.   Stairs             Wheelchair Mobility    Modified Rankin (Stroke Patients Only)       Balance Overall balance assessment: Modified Independent;Needs assistance Sitting-balance support: Feet supported Sitting balance-Leahy Scale: Good     Standing balance support: Bilateral upper extremity supported;During functional activity Standing balance-Leahy Scale: Fair Standing balance comment: benefits from B UE assist at this time and supervision/min guard                            Cognition Arousal/Alertness: Awake/alert Behavior During Therapy: WFL for tasks assessed/performed Overall Cognitive Status: Within Functional Limits for tasks assessed                                        Exercises Other Exercises Other Exercises: long sitting hip abd/add, ap, seated laq, x 10 reps each, STS x 3 reps    General Comments        Pertinent Vitals/Pain Pain Assessment: No/denies pain    Home Living                      Prior Function            PT Goals (current goals can now be found in the care plan section) Acute Rehab PT  Goals Patient Stated Goal: to return home with daughter PT Goal Formulation: With patient Time For Goal Achievement: 11/23/19 Potential to Achieve Goals: Good Additional Goals Additional Goal #1: Patient able to demonstrate management of oxygen tubing extensions while mobilizing in room (around furniture) with appropriate assistive device and minguard assist (if requires home O2) Progress towards PT goals: Progressing toward goals    Frequency    Min 3X/week      PT Plan Current plan remains appropriate    Co-evaluation              AM-PAC PT "6 Clicks" Mobility   Outcome Measure  Help needed turning from your back to your side while in a flat  bed without using bedrails?: None Help needed moving from lying on your back to sitting on the side of a flat bed without using bedrails?: A Little Help needed moving to and from a bed to a chair (including a wheelchair)?: A Little Help needed standing up from a chair using your arms (e.g., wheelchair or bedside chair)?: A Little Help needed to walk in hospital room?: A Little Help needed climbing 3-5 steps with a railing? : A Lot 6 Click Score: 18    End of Session Equipment Utilized During Treatment: Gait belt;Oxygen Activity Tolerance: Patient tolerated treatment well Patient left: in chair;with call bell/phone within reach Nurse Communication: Mobility status PT Visit Diagnosis: Muscle weakness (generalized) (M62.81);Difficulty in walking, not elsewhere classified (R26.2)     Time: 8453-6468 PT Time Calculation (min) (ACUTE ONLY): 26 min  Charges:  $Gait Training: 8-22 mins $Therapeutic Exercise: 8-22 mins                     Viet Kemmerer, PT, GCS 11/12/19,12:08 PM

## 2019-11-12 NOTE — Progress Notes (Signed)
PROGRESS NOTE    Whitney Sloan  YPP:[id number] DOB: 21-Oct-1955 DOA: 11/08/2019 PCP: Emelia Loron, NP   Brief Narrative:  HPI On 11/08/2019 by Dr. 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  Patient has been obtained from a combination of the patient and the daughter who is at the bedside.  Approximately 1 month ago, the patient began to experience bilateral lower extremity swelling.  The weeks that followed, as patient's bilateral lower extremity swelling began to worsen, patient also began to experience episodic shortness of breath.  The shortness of breath initially was primarily associated with pillow orthopnea and paroxysmal nocturnal dyspnea.  As the weeks continue to progress patient began to also develop severe dyspnea on exertion that was worse with exertion and improved with rest.  Patient also notes associated increasing abdominal girth and a weight gain of at least 10 pounds over the span of time.  Upon further questioning patient denies fever, cough, sick contacts, recent travel, chest pain or confirmed contact with COVID-19.  Patient eventually presented to [facility] emergency department for evaluation where patient was found to be in respiratory distress with markedly elevated blood pressures initially concerning for hypertensive crisis and acute pulmonary edema.  Patient was placed on BiPAP, given 80 mg of IV Lasix and initiated on nitroglycerin infusion all of which resulted in improvement in shortness of breath.  The hospitalist group was then called to assess the patient for admission to the hospital.  Interim history Patient mated with acute hypoxic respiratory failure and pulmonary edema.  Found to have diastolic heart  failure, placed on IV Lasix. Also has urinary retention, attempt to remove Foley catheter today and monitor. Assessment & Plan   Acute hypoxic respiratory failure with pulmonary edema -Patient required BiPAP on admission however has now been transitioned to 4L nasal cannula -Patient is also noted to have hypertensive crisis, also suspect some underlying component of congestive heart failure -Patient was given IV Lasix, 80 mg on admission as well as a nitro drip -Continue IV Lasix twice daily- 60mg  -Echocardiogram as below -Home desaturation test was done on 11/11/2019, and showed the patient maintained oxygen saturations in the 90s at rest and with ambulation on room air -Chest x-ray obtained on 11/11/2019 also shows stable CHF  Acute diastolic CHF -Echocardiogram shows an EF of 50 to 45%, grade 2 diastolic dysfunction -Continue IV Lasix, 60 mg twice daily -Monitor intake and output, daily weights -Urine output over the past 24 hours 2400 cc.  -Not sure that daily weights are accurate -Have ordered CHF information -Cardiology consulted and appreciated  Hypertensive urgency -Patient did require nitroglycerin upon admission -Continue IV Lasix, amlodipine, lisinopril and monitor  Diabetes mellitus, type II, uncontrolled with hyperglycemia -Continue lantus, insulin sliding scale -metformin and Victoza held -Hemoglobin A1c 10.1 -Have discussed the importance of controlling patient's blood sugar -Nutrition consulted  Hyperlipidemia -Continue statin  Insomnia -Patient states she has had insomnia for the past 2 to 3 years however did not speak to her primary care physician -Continue Ambien QHS PRN  Urinary retention -Initially on admission, patient noted to have urinary retention.  Foley catheter placed to monitor intake and output -Foley was removed on 11/11/2019 however patient continued to have urinary retention.  Foley catheter replaced. -Wonder if constipation may be adding  to  this. -Continue Flomax  Question OSA -Daughter and patient asked about possible use of CPAP -Explained to patient that she will need a sleep studyWhich is normally done as an outpatient  Deconditioning -PT consulted and recommended home health with 24-hour supervision -OT consulted and recommended home health, 3 and 1 bedside commode  Right foot callus -X-ray obtained: soft tissue swelling over dorsum of the foot without underlying fracture -Wound care consulted  Constipation -Patient states she has taken Dulcolax however not had a bowel movement in several days.  At home she takes some type of fiber medication. -Started FiberCon, order Dulcolax suppository as well as MiraLAX  Fever/SIRS -Patient did have a fever of 100.6 F.  She has been tachycardic as well. -No leukocytosis at this time. -Chest x-ray obtained on 11/11/2019 showed stable mild CHF. -UA obtained on 5/31 shows rare bacteria, 6-10 WBC, negative nitrites and leukocytes -Blood cultures pending -Will obtain KUB -Patient currently afebrile at this time  DVT Prophylaxis Lovenox  Code Status: Full  Family Communication: Daughter at bedside (all questions answered)  Disposition Plan:  Status is: Inpatient  Remains inpatient appropriate because:IV treatments appropriate due to intensity of illness or inability to take PO.  Patient with fever overnight.  Continues to need Foley catheter for urinary retention.  Also cardiology was consulted for CHF management.   Dispo: The patient is from: Home              Anticipated d/c is to: Home              Anticipated d/c date is: 2 days              Patient currently is not medically stable to d/c.   Consultants None  Procedures  Echocardiogram  Antibiotics   Anti-infectives (From admission, onward)   None      Subjective:   [name] seen and examined today.  Patient does feel her breathing has improved however continues to feel winded with minimal movement  or exertion.  Is concerned that she still has a Foley catheter and is unable to urinate on her own.  Also complains of constipation.  She denies any abdominal pain at this time.  Denies nausea or vomiting.  Denies chest pain, dizziness or headache.   Objective:   Vitals:   11/12/19 0121 11/12/19 0300 11/12/19 0800 11/12/19 0804  BP: 125/65   (!) 149/95  Pulse: 100   (!) 110  Resp:   (!) 29 16  Temp: 98.1 F (36.7 C)   98.9 F (37.2 C)  TempSrc: Oral     SpO2: 95%   94%  Weight:  115.4 kg    Height:        Intake/Output Summary (Last 24 hours) at 11/12/2019 0914 Last data filed at 11/12/2019 4166 Gross per 24 hour  Intake 240 ml  Output 2400 ml  Net -2160 ml   Filed Weights   11/10/19 0300 11/11/19 0352 11/12/19 0300  Weight: 116.5 kg 117.6 kg 115.4 kg   Exam  General: Well developed, well nourished, NAD, appears stated age  HEENT: NCAT, mucous membranes moist.   Cardiovascular: S1 S2 auscultated, tachycardic  Respiratory: Diminished breath sounds, no wheezing  Abdomen: Soft, obese, nontender, nondistended, + bowel sounds  Extremities: warm dry without cyanosis clubbing.  +Lower extremity edema.  Callus on right sole.  Neuro: AAOx3, nonfocal  Psych: Appropriate mood and affect, pleasant  Data Reviewed: I have personally reviewed following labs and imaging studies  CBC: Recent Labs  Lab 11/08/19 0115 11/08/19 0604 11/09/19 0442 11/10/19 0509 11/12/19 0041  WBC 14.4*  --  15.7* 13.2* 11.3*  NEUTROABS 6.2  --   --   --   --   HGB 13.3 13.3 12.5 12.3 12.0  HCT 43.1 39.0 39.6 39.7 37.8  MCV 87.8  --  86.3 86.9 84.9  PLT 338  --  311 297 255   Basic Metabolic Panel: Recent Labs  Lab 11/08/19 0115 11/08/19 0115 11/08/19 0604 11/09/19 0442 11/10/19 0509 11/11/19 0323 11/12/19 0041  NA 140   < > 140 142 142 141 139  K 3.7   < > 3.8 3.5 3.6 3.5 3.5  CL 100  --   --  103 104 99 103  CO2 25  --   --  29 30 29 29   GLUCOSE 324*  --   --  170* 161* 238* 134*    BUN 13  --   --  19 17 22 17   CREATININE 0.88  --   --  0.73 0.66 0.79 0.72  CALCIUM 8.9  --   --  9.0 8.8* 8.6* 8.4*   < > = values in this interval not displayed.   GFR: Estimated Creatinine Clearance: 90.2 mL/min (by C-G formula based on SCr of 0.72 mg/dL). Liver Function Tests: Recent Labs  Lab 11/08/19 0115  AST 39  ALT 24  ALKPHOS 77  BILITOT 1.0  PROT 7.7  ALBUMIN 3.4*   No results for input(s): LIPASE, AMYLASE in the last 168 hours. No results for input(s): AMMONIA in the last 168 hours. Coagulation Profile: No results for input(s): INR, PROTIME in the last 168 hours. Cardiac Enzymes: No results for input(s): CKTOTAL, CKMB, CKMBINDEX, TROPONINI in the last 168 hours. BNP (last 3 results) No results for input(s): PROBNP in the last 8760 hours. HbA1C: No results for input(s): HGBA1C in the last 72 hours. CBG: Recent Labs  Lab 11/11/19 0740 11/11/19 1104 11/11/19 1610 11/11/19 2055 11/12/19 0800  GLUCAP 166* 165* 103* 152* 132*   Lipid Profile: No results for input(s): CHOL, HDL, LDLCALC, TRIG, CHOLHDL, LDLDIRECT in the last 72 hours. Thyroid Function Tests: No results for input(s): TSH, T4TOTAL, FREET4, T3FREE, THYROIDAB in the last 72 hours. Anemia Panel: No results for input(s): VITAMINB12, FOLATE, FERRITIN, TIBC, IRON, RETICCTPCT in the last 72 hours. Urine analysis:    Component Value Date/Time   COLORURINE YELLOW 11/11/2019 2331   APPEARANCEUR HAZY (A) 11/11/2019 2331   LABSPEC 1.018 11/11/2019 2331   PHURINE 5.0 11/11/2019 2331   GLUCOSEU NEGATIVE 11/11/2019 2331   HGBUR LARGE (A) 11/11/2019 2331   BILIRUBINUR NEGATIVE 11/11/2019 2331   KETONESUR NEGATIVE 11/11/2019 2331   PROTEINUR 100 (A) 11/11/2019 2331   UROBILINOGEN 0.2 10/01/2016 1648   NITRITE NEGATIVE 11/11/2019 2331   LEUKOCYTESUR NEGATIVE 11/11/2019 2331   Sepsis Labs: @LABRCNTIP (procalcitonin:4,lacticidven:4)  ) Recent Results (from the past 240 hour(s))  SARS Coronavirus 2 by  RT PCR (hospital order, performed in [facility] hospital lab) Nasopharyngeal Nasopharyngeal Swab     Status: None   Collection Time: 11/08/19  1:15 AM   Specimen: Nasopharyngeal Swab  Result Value Ref Range Status   SARS Coronavirus 2 NEGATIVE NEGATIVE Final    Comment: (NOTE) SARS-CoV-2 target nucleic acids are NOT DETECTED. The SARS-CoV-2 RNA is generally detectable in upper and lower respiratory specimens during the acute phase of infection. The lowest concentration of SARS-CoV-2 viral copies this assay can detect is 250 copies / mL. A  negative result does not preclude SARS-CoV-2 infection and should not be used as the sole basis for treatment or other patient management decisions.  A negative result may occur with improper specimen collection / handling, submission of specimen other than nasopharyngeal swab, presence of viral mutation(s) within the areas targeted by this assay, and inadequate number of viral copies (<250 copies / mL). A negative result must be combined with clinical observations, patient history, and epidemiological information. Fact Sheet for Patients:   BoilerBrush.com.cy Fact Sheet for Healthcare Providers: https://pope.com/ This test is not yet approved or cleared  by the [place] FDA and has been authorized for detection and/or diagnosis of SARS-CoV-2 by FDA under an Emergency Use Authorization (EUA).  This EUA will remain in effect (meaning this test can be used) for the duration of the COVID-19 declaration under Section 564(b)(1) of the Act, 21 U.S.C. section 360bbb-3(b)(1), unless the authorization is terminated or revoked sooner. Performed at [facility], [address], [place], [place] [address]   Culture, blood (routine x 2)     Status: None (Preliminary result)   Collection Time: 11/12/19 12:41 AM   Specimen: BLOOD  Result Value Ref Range Status   Specimen Description BLOOD RIGHT  ANTECUBITAL  Final   Special Requests   Final    BOTTLES DRAWN AEROBIC AND ANAEROBIC Blood Culture results may not be optimal due to an inadequate volume of blood received in culture bottles   Culture PENDING  Incomplete   Report Status PENDING  Incomplete      Radiology Studies: DG CHEST PORT 1 VIEW  Result Date: 11/11/2019 CLINICAL DATA:  Dyspnea EXAM: PORTABLE CHEST 1 VIEW COMPARISON:  11/08/2019 chest radiograph. FINDINGS: Stable cardiomediastinal silhouette with mild cardiomegaly. No pneumothorax. No significant pleural effusions. Mild pulmonary edema, similar. IMPRESSION: Stable mild congestive heart failure. Electronically Signed   By: Delbert Phenix M.D.   On: 11/11/2019 11:20     Scheduled Meds: . amLODipine  10 mg Oral Daily  . aspirin EC  81 mg Oral Daily  . bisacodyl  10 mg Rectal Once  . Chlorhexidine Gluconate Cloth  6 each Topical Daily  . enoxaparin (LOVENOX) injection  40 mg Subcutaneous Q24H  . furosemide  60 mg Intravenous BID  . insulin aspart  0-20 Units Subcutaneous TID AC & HS  . insulin aspart  16 Units Subcutaneous TID WC  . insulin glargine  50 Units Subcutaneous Daily  . lisinopril  40 mg Oral Daily  . multivitamin with minerals  1 tablet Oral Daily  . pneumococcal 23 valent vaccine  0.5 mL Intramuscular Tomorrow-1000  . polycarbophil  625 mg Oral Daily  . polyethylene glycol  17 g Oral Daily  . simvastatin  20 mg Oral QHS  . sodium chloride flush  3 mL Intravenous Q12H  . tamsulosin  0.4 mg Oral Daily   Continuous Infusions: . sodium chloride       LOS: 4 days   Time Spent in minutes   45 minutes (greater than 50% of time spent with patient face to face, as well as reviewing records, answering all questions, and formulating a plan)   Edsel Petrin D.O. on 11/12/2019 at 9:14 AM  Between 7am to 7pm - Please see pager noted on [email or web address]  After 7pm go to [email or web address]  And look for the night coverage person covering for me after hours  [facility]  Hospitalist Group Office  [phone]

## 2019-11-12 NOTE — Progress Notes (Signed)
Nutrition Follow-up  DOCUMENTATION CODES:   Morbid obesity  INTERVENTION:  Provided diet education  Continue MVI daily  NUTRITION DIAGNOSIS:   Altered nutrition lab value related to chronic illness(uncontrolled DM2) as evidenced by (A1c 10.1).  Ongoing.  GOAL:   (Patient will adhere to dietary recommendations to obtain better glucose control, lowering A1c)  Progressing.  MONITOR:   PO intake, Labs, I & O's, Weight trends  REASON FOR ASSESSMENT:   Consult Assessment of nutrition requirement/status, Diet education  ASSESSMENT:   64 year old female with past medical history of DM2, HLD, HTN, obesity, history of stroke in 2011 with residual gait ataxia and slurring of speech admitted for acute respiratory failure with hypoxia and pulmonary edema after presenting due to episodic SOB, BLE swelling, and incrased abdominal girth that has been worsening over the past month.   RD consulted for nutrition education regarding a Heart Healthy Consistent Carbohydrate diet.   Pt reports appetite is good. RD provided "Heart Healthy Consistent Carbohydrate" handout from the Academy of Nutrition and Dietetics. Discussed different food groups and their effects on blood sugar, emphasizing carbohydrate-containing foods. Provided list of carbohydrates and recommended serving sizes of common foods. Discussed importance of controlled and consistent carbohydrate intake throughout the day. Provided examples of ways to balance meals/snacks and encouraged intake of high-fiber, whole grain complex carbohydrate. Also provided examples on ways to decrease sodium and fat intake in diet. Discouraged intake of processed foods and use of salt shaker. Encouraged fresh fruits and vegetables as well as whole grain sources of carbohydrates to maximize fiber intake. Teach back method used.  PO Intake: 100% x last 8 recorded meals  Lipid Panel     Component Value Date/Time   CHOL 331 (H) 03/23/2010 2128   TRIG  201 (H) 03/23/2010 2128   HDL 46 03/23/2010 2128   CHOLHDL 7.2 Ratio 03/23/2010 2128   VLDL 40 03/23/2010 2128   LDLCALC 245 (H) 03/23/2010 2128    Lab Results  Component Value Date   HGBA1C 10.1 (H) 11/08/2019   CBGs 103-200  Medications reviewed and include: dulcolax, novolog, lasix,fibercon, miralax, lantus, MVI  UOP: x24 hours I/O: -5,879.40ml since admit   Diet Order:   Diet Order            Diet heart healthy/carb modified Room service appropriate? Yes; Fluid consistency: Thin  Diet effective now              EDUCATION NEEDS:   Education needs have been addressed  Skin:  Skin Assessment: Reviewed RN Assessment  Last BM:  5/27  Height:   Ht Readings from Last 1 Encounters:  11/08/19 5\' 5"  (1.651 m)    Weight:   Wt Readings from Last 1 Encounters:  11/12/19 115.4 kg    Ideal Body Weight:  56.8 kg  BMI:  Body mass index is 42.34 kg/m.  Estimated Nutritional Needs:   Kcal:  1800-2000 (32-35 g/kg/IBW)  Protein:  90-100  Fluid:  1.8 L   01/12/20, MS, RD, LDN RD pager number and weekend/on-call pager number located in [place].

## 2019-11-12 NOTE — TOC Progression Note (Addendum)
Transition of Care [facility]) - Progression Note    Patient Details  Name: Whitney Sloan MRN: [id number] Date of Birth: May 04, 1956  Transition of Care [facility]) CM/SW [place], [place] Phone Number: 11/12/2019, 1:04 PM  Clinical Narrative:     Met with pt daughter in room. Daughter confirmed need for 3-in-1 and rolling walker. CSW arranged DME with Zack from [employer].  Called additional [place] agencies; the following unable to accept:  [facility]  Expected Discharge Plan: [place] Barriers to Discharge: Continued Medical Work up  Expected Discharge Plan and Services Expected Discharge Plan: [place] In-house Referral: Clinical Social Work   Post Acute Care Choice: [place] arrangements for the past 2 months: Apartment                 DME Arranged: 3-N-1, Walker rolling DME Agency: [employer] Date DME Agency Contacted: 11/12/19   Representative spoke with at DME Agency: Zack HH Arranged: PT, OT [place] Agency: [place] Date [place]: 11/11/19 Time [place]: 1316 Representative spoke with at [place]: [place] Determinants of Health ([place]) Interventions    Readmission Risk Interventions No flowsheet data found.

## 2019-11-12 NOTE — Progress Notes (Signed)
Patient states she has had no heart failure teaching. Does state she has a scale at home. Daily weight monitoring reviewed as well as low salt diet. Daughter present in room during teaching. Reinforcement needed.

## 2019-11-13 ENCOUNTER — Ambulatory Visit: Payer: Medicaid Other | Admitting: Internal Medicine

## 2019-11-13 DIAGNOSIS — I10 Essential (primary) hypertension: Secondary | ICD-10-CM

## 2019-11-13 LAB — BASIC METABOLIC PANEL
Anion gap: 11 (ref 5–15)
BUN: 19 mg/dL (ref 8–23)
CO2: 29 mmol/L (ref 22–32)
Calcium: 8.8 mg/dL — ABNORMAL LOW (ref 8.9–10.3)
Chloride: 99 mmol/L (ref 98–111)
Creatinine, Ser: 0.83 mg/dL (ref 0.44–1.00)
GFR calc Af Amer: >60 mL/min (ref >60)
GFR calc non Af Amer: >60 mL/min (ref >60)
Glucose, Bld: 130 mg/dL — ABNORMAL HIGH (ref 70–99)
Potassium: 3.4 mmol/L — ABNORMAL LOW (ref 3.5–5.1)
Sodium: 139 mmol/L (ref 135–145)

## 2019-11-13 LAB — LIPID PANEL
Cholesterol: 168 mg/dL (ref 0–200)
HDL: 48 mg/dL (ref >40)
LDL Cholesterol: 101 mg/dL — ABNORMAL HIGH (ref 0–99)
Total CHOL/HDL Ratio: 3.5 RATIO
Triglycerides: 97 mg/dL (ref <150)
VLDL: 19 mg/dL (ref 0–40)

## 2019-11-13 LAB — CBC
HCT: 35.6 % — ABNORMAL LOW (ref 36.0–46.0)
Hemoglobin: 11.1 g/dL — ABNORMAL LOW (ref 12.0–15.0)
MCH: 26.6 pg (ref 26.0–34.0)
MCHC: 31.2 g/dL (ref 30.0–36.0)
MCV: 85.4 fL (ref 80.0–100.0)
Platelets: 271 10*3/uL (ref 150–400)
RBC: 4.17 MIL/uL (ref 3.87–5.11)
RDW: 14.5 % (ref 11.5–15.5)
WBC: 11.9 10*3/uL — ABNORMAL HIGH (ref 4.0–10.5)
nRBC: 0 % (ref 0.0–0.2)

## 2019-11-13 LAB — GLUCOSE, CAPILLARY
Glucose-Capillary: 125 mg/dL — ABNORMAL HIGH (ref 70–99)
Glucose-Capillary: 127 mg/dL — ABNORMAL HIGH (ref 70–99)
Glucose-Capillary: 131 mg/dL — ABNORMAL HIGH (ref 70–99)
Glucose-Capillary: 152 mg/dL — ABNORMAL HIGH (ref 70–99)

## 2019-11-13 MED ORDER — POTASSIUM CHLORIDE CRYS ER 20 MEQ PO TBCR
40.0000 meq | EXTENDED_RELEASE_TABLET | Freq: Once | ORAL | Status: AC
  Administered 2019-11-13: 40 meq via ORAL
  Filled 2019-11-13: qty 2

## 2019-11-13 MED ORDER — ATORVASTATIN CALCIUM 10 MG PO TABS
20.0000 mg | ORAL_TABLET | Freq: Every day | ORAL | Status: DC
  Administered 2019-11-13 – 2019-11-14 (×2): 20 mg via ORAL
  Filled 2019-11-13 (×2): qty 2

## 2019-11-13 NOTE — Progress Notes (Signed)
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 This has progressively worsened over ~4 weeks PTA. Required BiPAP <24 hrs. Newly diagnosed CHF. Hypertensive urgency   OT comments  Patient continues to make steady progress towards goals in skilled OT session. Patient's session encompassed education with daughter present with regard to energy conservation techniques and adaptive equipment for home. Pt and daughter receptive to techniques, acknowledging that daughter assists with laundry and other tasks to prevent over exertion. Expressed importance of having 3 in 1 close to bed at night if plan is to stay on diuretic medication to prevent falls due to urgency. Pt left with handout and all needs met. Discharge remains appropriate to date; will continue to follow acutely.    Follow Up Recommendations  Home health OT    Equipment Recommendations  3 in 1 bedside commode;Other (comment)(RW)    Recommendations for Other Services      Precautions / Restrictions Precautions Precautions: Fall Precaution Comments: watch O2 sats and dyspnea Restrictions Weight Bearing Restrictions: No       Mobility Bed Mobility               General bed mobility comments: oob in recliner upon arrival  Transfers Overall transfer level: Needs assistance Equipment used: Rolling walker (2 wheeled) Transfers: Sit to/from Stand Sit to Stand: Supervision         General transfer comment: cues for hand placement needed; 2 sit to stands during session for transfers (further for exercise)    Balance     Sitting  balance-Leahy Scale: Good     Standing balance support: Bilateral upper extremity supported;During functional activity Standing balance-Leahy Scale: Fair Standing balance comment: benefits from B UE assist at this time and supervision/min guard                           ADL either performed or assessed with clinical judgement   ADL Overall ADL's : Needs assistance/impaired                                       General ADL Comments: Session focus on energy conservation strategies and education provided with handout     Vision       Perception     Praxis      Cognition Arousal/Alertness: Awake/alert Behavior During Therapy: WFL for tasks assessed/performed Overall Cognitive Status: Within Functional Limits for tasks assessed                                 General Comments: a&ox4        Exercises General Exercises - Lower Extremity Hip Flexion/Marching: AROM;Both;10 reps;Standing(min guard w RW) Heel Raises: AROM;Both;10 reps;Standing(min guard with RW) Other Exercises Other Exercises: sit to stand x 10 with cues for hand placement and supervision   Shoulder Instructions       General Comments Pt on 2 LPM O2 at arrival with sats 99%.  Placed on RA and sats 94% at rest, ambulated  on RA and sats 97%.  Left on RA and notified RN.  HR 103 bpm rest and up to 128 bpm walking    Pertinent Vitals/ Pain       Pain Assessment: No/denies pain  Home Living                                          Prior Functioning/Environment              Frequency  Min 2X/week        Progress Toward Goals  OT Goals(current goals can now be found in the care plan section)  Progress towards OT goals: Progressing toward goals  Acute Rehab OT Goals Patient Stated Goal: to return home with daughter OT Goal Formulation: With patient/family Time For Goal Achievement: 11/25/19 Potential to Achieve Goals: Good  Plan  Discharge plan remains appropriate    Co-evaluation                 AM-PAC OT "6 Clicks" Daily Activity     Outcome Measure   Help from another person eating meals?: None Help from another person taking care of personal grooming?: A Little Help from another person toileting, which includes using toliet, bedpan, or urinal?: A Lot Help from another person bathing (including washing, rinsing, drying)?: A Lot Help from another person to put on and taking off regular upper body clothing?: A Little Help from another person to put on and taking off regular lower body clothing?: A Lot 6 Click Score: 16    End of Session    OT Visit Diagnosis: Unsteadiness on feet (R26.81);Muscle weakness (generalized) (M62.81)   Activity Tolerance Patient tolerated treatment well   Patient Left in bed;with call bell/phone within reach;with family/visitor present   Nurse Communication Mobility status;Precautions        Time: 8638-1771 OT Time Calculation (min): 12 min  Charges: OT General Charges $OT Visit: 1 Visit OT Treatments $Self Care/Home Management : 8-22 mins  Corinne Port[place] E. Young, COTA/L Acute Rehabilitation Services [employer] 11/13/2019, 11:29 AM

## 2019-11-13 NOTE — Progress Notes (Signed)
Progress Note  Patient Name: Whitney Sloan Date of Encounter: 11/13/2019  Primary Cardiologist: No primary care provider on file.   Subjective   Net negative 1.2L yesterday (7.1L on admission).  Creatinine stable at 0.8.  Reports dyspnea has improved.  Inpatient Medications    Scheduled Meds: . amLODipine  10 mg Oral Daily  . aspirin EC  81 mg Oral Daily  . bisacodyl  10 mg Rectal Once  . Chlorhexidine Gluconate Cloth  6 each Topical Daily  . enoxaparin (LOVENOX) injection  40 mg Subcutaneous Q24H  . furosemide  60 mg Intravenous BID  . insulin aspart  0-20 Units Subcutaneous TID AC & HS  . insulin aspart  16 Units Subcutaneous TID WC  . insulin glargine  50 Units Subcutaneous Daily  . lisinopril  40 mg Oral Daily  . multivitamin with minerals  1 tablet Oral Daily  . pneumococcal 23 valent vaccine  0.5 mL Intramuscular Tomorrow-1000  . polycarbophil  625 mg Oral Daily  . polyethylene glycol  17 g Oral Daily  . simvastatin  20 mg Oral QHS  . sodium chloride flush  3 mL Intravenous Q12H  . tamsulosin  0.4 mg Oral Daily   Continuous Infusions: . sodium chloride     PRN Meds: sodium chloride, acetaminophen, albuterol, bisacodyl, hydrALAZINE, iohexol, ondansetron (ZOFRAN) IV, sodium chloride flush, zolpidem   Vital Signs    Vitals:   11/12/19 1141 11/12/19 1558 11/12/19 2105 11/13/19 0752  BP:  119/61 127/90 (!) 146/77  Pulse:  (!) 105 (!) 102 (!) 108  Resp:  16 (!) 22 16  Temp:  98.7 F (37.1 C) 98.7 F (37.1 C) 99.1 F (37.3 C)  TempSrc:   Oral   SpO2: 93% 98% 99% 94%  Weight:      Height:        Intake/Output Summary (Last 24 hours) at 11/13/2019 0841 Last data filed at 11/12/2019 1500 Gross per 24 hour  Intake --  Output 1200 ml  Net -1200 ml   Last 3 Weights 11/12/2019 11/11/2019 11/10/2019  Weight (lbs) 254 lb 6.6 oz 259 lb 4.2 oz 256 lb 13.4 oz  Weight (kg) 115.4 kg 117.6 kg 116.5 kg      Telemetry    Sinus tachycardia, 100-110s- Personally  Reviewed  ECG    No new ECG- Personally Reviewed  Physical Exam   GEN: No acute distress.   Neck: No JVD appreciated but difficult toassess given body habitus Cardiac: tachycardic, regular, no murmurs, rubs, or gallops.  Respiratory: Clear to auscultation bilaterally. GI: Soft, nontender, non-distended  MS: 1+ BLE edema Neuro:  Nonfocal  Psych: Normal affect   Labs    High Sensitivity Troponin:   Recent Labs  Lab 11/08/19 0115 11/08/19 0326  TROPONINIHS 19* 34*      Chemistry Recent Labs  Lab 11/08/19 0115 11/08/19 0604 11/11/19 0323 11/12/19 0041 11/13/19 0218  NA 140   < > 141 139 139  K 3.7   < > 3.5 3.5 3.4*  CL 100   < > 99 103 99  CO2 25   < > 29 29 29   GLUCOSE 324*   < > 238* 134* 130*  BUN 13   < > 22 17 19   CREATININE 0.88   < > 0.79 0.72 0.83  CALCIUM 8.9   < > 8.6* 8.4* 8.8*  PROT 7.7  --   --   --   --   ALBUMIN 3.4*  --   --   --   --  AST 39  --   --   --   --   ALT 24  --   --   --   --   ALKPHOS 77  --   --   --   --   BILITOT 1.0  --   --   --   --   GFRNONAA >60   < > >60 >60 >60  GFRAA >60   < > >60 >60 >60  ANIONGAP 15   < > 13 7 11    < > = values in this interval not displayed.     Hematology Recent Labs  Lab 11/10/19 0509 11/12/19 0041 11/13/19 0218  WBC 13.2* 11.3* 11.9*  RBC 4.57 4.45 4.17  HGB 12.3 12.0 11.1*  HCT 39.7 37.8 35.6*  MCV 86.9 84.9 85.4  MCH 26.9 27.0 26.6  MCHC 31.0 31.7 31.2  RDW 14.8 14.5 14.5  PLT 297 255 271    BNP Recent Labs  Lab 11/08/19 0115  BNP 132.6*     DDimer No results for input(s): DDIMER in the last 168 hours.   Radiology    DG Abd 1 View  Result Date: 11/12/2019 CLINICAL DATA:  Abdominal pain.  Constipation. EXAM: ABDOMEN - 1 VIEW COMPARISON:  None. FINDINGS: Questionable punctate 3 mm calcification over right kidney suggesting right nephrolithiasis. Large amount of stool noted throughout the colon suggesting constipation. Several slightly prominent air-filled loops of small  bowel noted. Follow-up exam suggested to demonstrate resolution. Degenerative changes lumbar spine and both hips. IMPRESSION: 1.  Questionable 3 mm stone right kidney. 2. Large amount of stool noted throughout the colon suggesting constipation. Several slightly prominent air-filled loops of small bowel noted. Follow-up exam suggested to demonstrate resolution. Electronically Signed   By: 01/12/2020  Register   On: 11/12/2019 09:40   DG CHEST PORT 1 VIEW  Result Date: 11/11/2019 CLINICAL DATA:  Dyspnea EXAM: PORTABLE CHEST 1 VIEW COMPARISON:  11/08/2019 chest radiograph. FINDINGS: Stable cardiomediastinal silhouette with mild cardiomegaly. No pneumothorax. No significant pleural effusions. Mild pulmonary edema, similar. IMPRESSION: Stable mild congestive heart failure. Electronically Signed   By: 11/10/2019 M.D.   On: 11/11/2019 11:20    Cardiac Studies   TTE 11/08/19:  1. Left ventricular ejection fraction, by estimation, is 50 to 55%. The  left ventricle has low normal function. The left ventricle demonstrates  global hypokinesis. Left ventricular diastolic parameters are consistent  with Grade II diastolic dysfunction  (pseudonormalization). Elevated left ventricular end-diastolic pressure.  2. Right ventricular systolic function is normal. The right ventricular  size is normal.  3. The mitral valve is abnormal. Trivial mitral valve regurgitation.  4. The aortic valve is tricuspid. Aortic valve regurgitation is not  visualized. Mild aortic valve sclerosis is present, with no evidence of  aortic valve stenosis.  5. The inferior vena cava is dilated in size with >50% respiratory  variability, suggesting right atrial pressure of 8 mmHg.   Patient Profile     64 y.o. 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  Assessment & Plan    Acute diastolic heart failure: presented with progressive SOB,  LLE, orthopnea, PND for 1  Month.  Echo showed EF 50-55% with G2DD. - Diuresing well on IV lasix 60 mg BID, would continue - Monitor strict I/Os, daily weights, and creatinine with diuresis  Acute respiratory failure with pulmonary edema: likely 2/2 acute diastolic  heart failure.  Was on Bipap and nitro infusion in the ED.  Improving with diuresis, now on 2L [place]  Hypertensive urgency: nitro infusion in the ED. BP improved, now on amlodipine and lisinopril -Continue amlodipine 10 mg daily lisinopril 40 mg daily  HLD: LDL 101.  Will switch from simvastatin to atorvastatin 20 mg daily  Possible SIRS: Low grade fever yesterday and tachycardia around 100 bpm.  Normal lactate.  Blood cultures NGTD   For questions or updates, please contact [facility] Please consult [email or web address] for contact info under        Signed, Little Ishikawa, MD  11/13/2019, 8:41 AM

## 2019-11-13 NOTE — Progress Notes (Signed)
Inpatient Diabetes Program Recommendations  AACE/ADA: New Consensus Statement on Inpatient Glycemic Control (2015)  Target Ranges:  Prepandial:   less than 140 mg/dL      Peak postprandial:   less than 180 mg/dL (1-2 hours)      Critically ill patients:  140 - 180 mg/dL   Lab Results  Component Value Date   GLUCAP 127 (H) 11/13/2019   HGBA1C 10.1 (H) 11/08/2019    Review of Glycemic Control Results for IVALENE, PLATTE (MRN [id number]) as of 11/13/2019 13:25  Ref. Range 11/12/2019 11:33 11/12/2019 16:40 11/12/2019 21:05 11/13/2019 07:49 11/13/2019 11:23  Glucose-Capillary Latest Ref Range: 70 - 99 mg/dL 465 (H) 681 (H) 275 (H) 125 (H) 127 (H)    Diabetes history:  DM2  Outpatient Diabetes medications:  Lantus 50 units daily  Novolog 16 units tid Metformin 500 mg bid  Current orders for Inpatient glycemic control:  Novolog 0-20 tid and hs Novolog 16 units tid    Note:  Spoke with patient at bedside.  Reviewed patient's current A1c of 10.1% which is down from 11.7%. Explained what a A1c is and what it measures. Also reviewed goal A1c with patient, importance of good glucose control @ home, and blood sugar goals.  She does not drink any beverages with sugar. She states she tries to watch her CHO intake.  She checks her blood sugar regularly.  She is current with [facility] endocrinology.  Patient states she does not have any hypoglycemic episodes and does know how to treat and identify symptoms.  She denies difficulties obtaining medications and states she takes as prescribed.  Patient is falling asleep while trying to educate.  Will attempt again tomorrow.  Thank you, Dulce Sellar, RN, BSN Diabetes Coordinator Inpatient Diabetes Program [phone] (team pager from 8a-5p)

## 2019-11-13 NOTE — Progress Notes (Signed)
Pt. Foley was pulled at/around 12 noon. While attempting to do a bladder scan, we were unsuccessful and locating any urine. Pt. Was encouraged to try to use the restroom. Pt. Was able to void - unfortunately we were not able to capture the amount.

## 2019-11-13 NOTE — Progress Notes (Signed)
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 This has progressively worsened over ~4 weeks PTA. Required BiPAP <24 hrs. Newly diagnosed CHF. Hypertensive urgency    PT Comments    Pt making good progress today.  She was able to ambulate with RW and supervision.  Pt was on 2 LPM O2 but able to be weaned to RA during therapy with sats stable at rest (94%) and ambulation (97%) . Pt  Does fatigue and with elevated HR requiring rest breaks.  Min cues for RW proximity, exercise form, rest breaks, and breathing technique throughout session.   Follow Up Recommendations  Home health PT;Supervision - Intermittent     Equipment Recommendations  None recommended by PT    Recommendations for Other Services       Precautions / Restrictions Precautions Precautions: Fall Precaution Comments: watch O2 sats and dyspnea Restrictions Weight Bearing Restrictions: No    Mobility  Bed Mobility               General bed mobility comments: oob in recliner upon arrival  Transfers Overall transfer level: Needs assistance Equipment used: Rolling walker (2 wheeled) Transfers: Sit to/from Stand Sit to Stand: Supervision         General transfer comment: cues for hand placement needed; 2 sit to stands during session for transfers (further for exercise)  Ambulation/Gait Ambulation/Gait assistance: Supervision Gait Distance (Feet): 200 Feet Assistive device: Rolling walker (2 wheeled)   Gait velocity: decreased   General Gait Details: Steady without LOB with RW; HR 103 bpm rest and up to 128 bpm with ambulation;  tried on RA and sats maintained 97%   Stairs             Wheelchair Mobility    Modified Rankin (Stroke Patients Only)       Balance     Sitting balance-Leahy Scale: Good     Standing balance support: Bilateral upper extremity supported;During functional activity Standing balance-Leahy Scale: Fair Standing balance comment: benefits from B UE assist at this time and supervision/min guard                            Cognition Arousal/Alertness: Awake/alert Behavior During Therapy: WFL for tasks assessed/performed Overall Cognitive Status: Within Functional Limits for tasks assessed                                        Exercises General Exercises - Lower Extremity Hip Flexion/Marching: AROM;Both;10 reps;Standing(min guard w RW) Heel Raises: AROM;Both;10 reps;Standing(min guard with RW) Other Exercises Other Exercises: sit to stand x 10 with cues for hand placement and supervision    General Comments General comments (skin integrity, edema, etc.): Pt on 2 LPM O2 at arrival with sats 99%.  Placed on RA and sats 94% at rest, ambulated on RA and sats 97%.  Left on RA and notified RN.  HR 103 bpm rest and up to 128 bpm walking      Pertinent Vitals/Pain Pain Assessment: No/denies pain    Home Living  Prior Function            PT Goals (current goals can now be found in the care plan section) Progress towards PT goals: Progressing toward goals    Frequency    Min 3X/week      PT Plan Current plan remains appropriate    Co-evaluation              AM-PAC PT "6 Clicks" Mobility   Outcome Measure  Help needed turning from your back to your side while in a flat bed without using bedrails?: None Help needed moving from lying on your back to sitting on the side of a flat bed without using bedrails?: None Help needed moving to and from a bed to a chair (including a wheelchair)?: None Help needed  standing up from a chair using your arms (e.g., wheelchair or bedside chair)?: None Help needed to walk in hospital room?: None Help needed climbing 3-5 steps with a railing? : A Little 6 Click Score: 23    End of Session Equipment Utilized During Treatment: Gait belt Activity Tolerance: Patient tolerated treatment well Patient left: in chair;with call bell/phone within reach;with family/visitor present Nurse Communication: Mobility status(Able to be placed oN RA) PT Visit Diagnosis: Muscle weakness (generalized) (M62.81);Difficulty in walking, not elsewhere classified (R26.2)     Time: 0998-3382 PT Time Calculation (min) (ACUTE ONLY): 30 min  Charges:  $Gait Training: 8-22 mins $Therapeutic Exercise: 8-22 mins                     Maggie Font, PT Acute Rehab Services Pager [phone] [facility] [phone]    Karlton Lemon 11/13/2019, 11:14 AM

## 2019-11-13 NOTE — Progress Notes (Signed)
Chaplain responded to Spiritual Consult for delivery of Advance Directive.  Chaplain found patient very sleepy and unable to stay awake long enough to hear explanation of AD. Daughter was present and confirmed there are times throughout the day when her mother is awake and able to understand the purpose of an AD.  Chaplain explained AD to daughter and advised to have nurse call for notary once the document completed by her mother.  In the meantime, daughter asked who is listed as main contact on her hospital records.  Facesheet has patient's mother Vernard Gambles) who is deceased.  Daughter requests her mother's records be updated to show contact info for her and her sister.  Chaplain advised unit secretary of this request so patient's record could be updated: Classie Weng (daugher)  [phone] Sol Blazing  (daughter)  [phone].  Vernell Morgans Chaplain Resident

## 2019-11-13 NOTE — Progress Notes (Signed)
PROGRESS NOTE    Whitney Sloan  PYP:[id number] DOB: Dec 16, 1955 DOA: 11/08/2019 PCP: Carmel Sacramento, NP   Brief Narrative: Patient is a 51 female with history of diabetes type 2, hyperlipidemia, hypertension, obesity, nonhemorrhagic stroke with residual gait ataxia, slurred speech who presented to the emergency department with complaints of shortness of breath, bilateral lower extremity swelling.  He had to be put on BiPAP on presentation.  He was hypotensive in the emergency department and was found to have acute pulmonary edema.  Started on IV Lasix.  Cardiology consulted and following.  Currently is being managed for acute hypoxic respiratory failure and pulmonary edema due to diastolic heart failure and on IV diuresis.  Hospital course remarkable for urinary retention.  Assessment & Plan:   Principal Problem:   Acute respiratory failure with hypoxia (HCC) Active Problems:   Uncontrolled type 2 diabetes mellitus with hyperglycemia, with long-term current use of insulin (HCC)   Acute cardiogenic pulmonary edema (HCC)   Acute CHF (congestive heart failure) (HCC)   Hypertensive crisis   Mixed diabetic hyperlipidemia associated with type 2 diabetes mellitus (HCC)   Acute hypoxic respiratory failure with pulmonary edema: Initially required BiPAP on presentation.  Currently on 2 L of oxygen per minute.  Continue IV diuresis. Home desaturation test was done on 11/11/2019, and showed the patient maintained oxygen saturations in the 90s at rest and with ambulation on room air Chest x-ray obtained on 11/11/2019 also shows stable CHF  Acute diastolic congestive heart failure: Echocardiogram showed ejection fraction of 50 to 55%, grade 2 diastolic dysfunction.  Currently on Lasix IV 60 mg twice a day.  Monitor input/output, daily weight.  Cardiology following.  Patient still has significant bilateral lower extremity edema.  Hypertensive urgency: Required nitroglycerin drip on admission.  Currently  blood pressure is stable.  On amlodipine, lisinopril.  Diabetes type 2: Uncontrolled with hyperglycemia.  On Lantus, sliding cell insulin.  Monitor CBGs  Hyperlipidemia: Continue statin  Insomnia: Currently on Ambien  Urinary retention: Currently on Foley catheter.  Continue Flomax.  Suspected OSA: Not on CPAP.  Needs sleep study as an outpatient.  Debilitation, deconditioning: PT recommended home health with 24-hour supervision.  Right foot callus: X-ray did not show any fracture.  Wound care consulted.  Constipation: Continue bowel regimen  Fever: Currently afebrile.  No leukocytosis.  Chest x-ray showed mild stable CHF.  Blood cultures no growth till date.   Nutrition Problem: Altered nutrition lab value Etiology: chronic illness(uncontrolled DM2)      DVT prophylaxis: Lovenox Code Status: Full Family Communication: Daughter present at the bedside Status is: Inpatient  Remains inpatient appropriate because:IV treatments appropriate due to intensity of illness or inability to take PO   Dispo: The patient is from: Home              Anticipated d/c is to: Home              Anticipated d/c date is: 1 day              Patient currently is not medically stable to d/c.     Consultants: Cardiology  Procedures:  Antimicrobials:  Anti-infectives (From admission, onward)   None      Subjective: Patient seen and examined at the bedside this morning.  Hemodynamically stable.  Daughter was present at the bedside also.  She was on 2 L of oxygen per minute.  Denies any respiratory complaints.  We discussed about giving her a voiding trial again today and  monitoring her on room air.  Objective: Vitals:   11/12/19 1141 11/12/19 1558 11/12/19 2105 11/13/19 0752  BP:  119/61 127/90 (!) 146/77  Pulse:  (!) 105 (!) 102 (!) 108  Resp:  16 (!) 22 16  Temp:  98.7 F (37.1 C) 98.7 F (37.1 C) 99.1 F (37.3 C)  TempSrc:   Oral   SpO2: 93% 98% 99% 94%  Weight:      Height:         Intake/Output Summary (Last 24 hours) at 11/13/2019 0827 Last data filed at 11/12/2019 1500 Gross per 24 hour  Intake --  Output 1200 ml  Net -1200 ml   Filed Weights   11/10/19 0300 11/11/19 0352 11/12/19 0300  Weight: 116.5 kg 117.6 kg 115.4 kg    Examination:  General exam: Appears calm and comfortable ,Not in distress, morbidly obese HEENT:PERRL,Oral mucosa moist, Ear/Nose normal on gross exam Respiratory system: Bilateral equal air entry, normal vesicular breath sounds, no wheezes or crackles  Cardiovascular system: S1 & S2 heard, RRR. No JVD, murmurs, rubs, gallops or clicks. Gastrointestinal system: Abdomen is nondistended, soft and nontender. No organomegaly or masses felt. Normal bowel sounds heard. Central nervous system: Alert and oriented. No focal neurological deficits. Extremities:2+ bilateral lower extremity  edema, no clubbing ,no cyanosis Skin: No rashes, lesions or ulcers,no icterus ,no pallor  Data Reviewed: I have personally reviewed following labs and imaging studies  CBC: Recent Labs  Lab 11/08/19 0115 11/08/19 0115 11/08/19 0604 11/09/19 0442 11/10/19 0509 11/12/19 0041 11/13/19 0218  WBC 14.4*  --   --  15.7* 13.2* 11.3* 11.9*  NEUTROABS 6.2  --   --   --   --   --   --   HGB 13.3   < > 13.3 12.5 12.3 12.0 11.1*  HCT 43.1   < > 39.0 39.6 39.7 37.8 35.6*  MCV 87.8  --   --  86.3 86.9 84.9 85.4  PLT 338  --   --  311 297 255 271   < > = values in this interval not displayed.   Basic Metabolic Panel: Recent Labs  Lab 11/09/19 0442 11/10/19 0509 11/11/19 0323 11/12/19 0041 11/12/19 0933 11/13/19 0218  NA 142 142 141 139  --  139  K 3.5 3.6 3.5 3.5  --  3.4*  CL 103 104 99 103  --  99  CO2 29 30 29 29   --  29  GLUCOSE 170* 161* 238* 134*  --  130*  BUN 19 17 22 17   --  19  CREATININE 0.73 0.66 0.79 0.72  --  0.83  CALCIUM 9.0 8.8* 8.6* 8.4*  --  8.8*  MG  --   --   --   --  2.0  --    GFR: Estimated Creatinine Clearance: 86.9  mL/min (by C-G formula based on SCr of 0.83 mg/dL). Liver Function Tests: Recent Labs  Lab 11/08/19 0115  AST 39  ALT 24  ALKPHOS 77  BILITOT 1.0  PROT 7.7  ALBUMIN 3.4*   No results for input(s): LIPASE, AMYLASE in the last 168 hours. No results for input(s): AMMONIA in the last 168 hours. Coagulation Profile: No results for input(s): INR, PROTIME in the last 168 hours. Cardiac Enzymes: No results for input(s): CKTOTAL, CKMB, CKMBINDEX, TROPONINI in the last 168 hours. BNP (last 3 results) No results for input(s): PROBNP in the last 8760 hours. HbA1C: No results for input(s): HGBA1C in the last 72 hours. CBG:  Recent Labs  Lab 11/12/19 0800 11/12/19 1133 11/12/19 1640 11/12/19 2105 11/13/19 0749  GLUCAP 132* 200* 171* 200* 125*   Lipid Profile: Recent Labs    11/13/19 0218  CHOL 168  HDL 48  LDLCALC 101*  TRIG 97  CHOLHDL 3.5   Thyroid Function Tests: No results for input(s): TSH, T4TOTAL, FREET4, T3FREE, THYROIDAB in the last 72 hours. Anemia Panel: No results for input(s): VITAMINB12, FOLATE, FERRITIN, TIBC, IRON, RETICCTPCT in the last 72 hours. Sepsis Labs: Recent Labs  Lab 11/12/19 0041  LATICACIDVEN 0.8    Recent Results (from the past 240 hour(s))  SARS Coronavirus 2 by RT PCR (hospital order, performed in [facility] hospital lab) Nasopharyngeal Nasopharyngeal Swab     Status: None   Collection Time: 11/08/19  1:15 AM   Specimen: Nasopharyngeal Swab  Result Value Ref Range Status   SARS Coronavirus 2 NEGATIVE NEGATIVE Final    Comment: (NOTE) SARS-CoV-2 target nucleic acids are NOT DETECTED. The SARS-CoV-2 RNA is generally detectable in upper and lower respiratory specimens during the acute phase of infection. The lowest concentration of SARS-CoV-2 viral copies this assay can detect is 250 copies / mL. A negative result does not preclude SARS-CoV-2 infection and should not be used as the sole basis for treatment or other patient management  decisions.  A negative result may occur with improper specimen collection / handling, submission of specimen other than nasopharyngeal swab, presence of viral mutation(s) within the areas targeted by this assay, and inadequate number of viral copies (<250 copies / mL). A negative result must be combined with clinical observations, patient history, and epidemiological information. Fact Sheet for Patients:   BoilerBrush.com.cy Fact Sheet for Healthcare Providers: https://pope.com/ This test is not yet approved or cleared  by the [place] FDA and has been authorized for detection and/or diagnosis of SARS-CoV-2 by FDA under an Emergency Use Authorization (EUA).  This EUA will remain in effect (meaning this test can be used) for the duration of the COVID-19 declaration under Section 564(b)(1) of the Act, 21 U.S.C. section 360bbb-3(b)(1), unless the authorization is terminated or revoked sooner. Performed at [facility], [address], [place], [place] [address]   Culture, blood (routine x 2)     Status: None (Preliminary result)   Collection Time: 11/12/19 12:37 AM   Specimen: BLOOD  Result Value Ref Range Status   Specimen Description BLOOD LEFT ANTECUBITAL  Final   Special Requests   Final    BOTTLES DRAWN AEROBIC ONLY Blood Culture results may not be optimal due to an inadequate volume of blood received in culture bottles   Culture   Final    NO GROWTH 1 DAY Performed at [facility], [address] [place], [place] [address]    Report Status PENDING  Incomplete  Culture, blood (routine x 2)     Status: None (Preliminary result)   Collection Time: 11/12/19 12:41 AM   Specimen: BLOOD  Result Value Ref Range Status   Specimen Description BLOOD RIGHT ANTECUBITAL  Final   Special Requests   Final    BOTTLES DRAWN AEROBIC AND ANAEROBIC Blood Culture results may not be optimal due to an inadequate volume of blood  received in culture bottles   Culture   Final    NO GROWTH 1 DAY Performed at [facility], [address] [place], [place] [address]    Report Status PENDING  Incomplete         Radiology  Studies: DG Abd 1 View  Result Date: 11/12/2019 CLINICAL DATA:  Abdominal pain.  Constipation. EXAM: ABDOMEN - 1 VIEW COMPARISON:  None. FINDINGS: Questionable punctate 3 mm calcification over right kidney suggesting right nephrolithiasis. Large amount of stool noted throughout the colon suggesting constipation. Several slightly prominent air-filled loops of small bowel noted. Follow-up exam suggested to demonstrate resolution. Degenerative changes lumbar spine and both hips. IMPRESSION: 1.  Questionable 3 mm stone right kidney. 2. Large amount of stool noted throughout the colon suggesting constipation. Several slightly prominent air-filled loops of small bowel noted. Follow-up exam suggested to demonstrate resolution. Electronically Signed   By: Marcello Moores  Register   On: 11/12/2019 09:40   DG CHEST PORT 1 VIEW  Result Date: 11/11/2019 CLINICAL DATA:  Dyspnea EXAM: PORTABLE CHEST 1 VIEW COMPARISON:  11/08/2019 chest radiograph. FINDINGS: Stable cardiomediastinal silhouette with mild cardiomegaly. No pneumothorax. No significant pleural effusions. Mild pulmonary edema, similar. IMPRESSION: Stable mild congestive heart failure. Electronically Signed   By: Ilona Sorrel M.D.   On: 11/11/2019 11:20        Scheduled Meds: . amLODipine  10 mg Oral Daily  . aspirin EC  81 mg Oral Daily  . bisacodyl  10 mg Rectal Once  . Chlorhexidine Gluconate Cloth  6 each Topical Daily  . enoxaparin (LOVENOX) injection  40 mg Subcutaneous Q24H  . furosemide  60 mg Intravenous BID  . insulin aspart  0-20 Units Subcutaneous TID AC & HS  . insulin aspart  16 Units Subcutaneous TID WC  . insulin glargine  50 Units Subcutaneous Daily  . lisinopril  40 mg Oral Daily  . multivitamin with minerals  1 tablet Oral Daily    . pneumococcal 23 valent vaccine  0.5 mL Intramuscular Tomorrow-1000  . polycarbophil  625 mg Oral Daily  . polyethylene glycol  17 g Oral Daily  . simvastatin  20 mg Oral QHS  . sodium chloride flush  3 mL Intravenous Q12H  . tamsulosin  0.4 mg Oral Daily   Continuous Infusions: . sodium chloride       LOS: 5 days    Time spent: 25 mins.More than 50% of that time was spent in counseling and/or coordination of care.      Shelly Coss, MD [facility] P6/07/2019, 8:27 AM

## 2019-11-13 NOTE — Progress Notes (Deleted)
Name: Whitney Sloan  Age/ Sex: 64 y.o., female   MRN/ DOB: [id number], 03-21-1956     PCP: Carmel Sacramento, NP   Reason for Endocrinology Evaluation: Type 2 Diabetes Mellitus  Initial Endocrine Consultative Visit: 09/10/2018    PATIENT IDENTIFIER: Whitney Sloan is a 64 y.o. female with a past medical history of HTN, T2DM and Dyslipidemia. The patient has followed with Endocrinology clinic since 09/10/2018 for consultative assistance with management of her diabetes.  DIABETIC HISTORY:  Whitney Sloan was diagnosed with T2DM in 2008, she has been on Metformin, Glipizide, Januvia and Actos in the past.She has been on insulin therapy for years. Her hemoglobin A1c has ranged from 8.0%in 2011, peaking at12.5%in 2010.  On her initial visit to our clinic her A1c was 11.0% , she was on lantus, Invokana, Metfomin and Pioglitazone but she was not taking them. Bydureon was added.   Invokana was stopped by the patient by 06/2019 due to recurrent yeast infections Prandial insulin added 07/2019  SUBJECTIVE:   During the last visit (08/07/2019): A1 c 12.4 % .  Patient was non adherent to her medication regimen, no changes were made she was encouraged to start with taking her medications    Today (11/13/2019): Whitney Sloan is here for a follow up on diabetes.She is accompanied by her daughter "Debbe Bales"  today .  She has not checked her blood sugar since 05/2019 . The patient has not had hypoglycemic episodes since the last clinic visit. Otherwise, the patient has not required any recent emergency interventions for hypoglycemia but has  had recent hospitalizations secondary to acute respiratory failure.    ROS: As per HPI and as detailed below: Review of Systems  Constitutional: Negative for chills and fever.  HENT: Negative for congestion and sore throat.   Respiratory: Negative for cough and shortness of breath.   Cardiovascular: Negative for chest pain and palpitations.  Gastrointestinal: Negative  for diarrhea and nausea.      HOME DIABETES REGIMEN:   Lantus 50 units daily   Novolog 16 units with each meal  Metformin 1000 mg BID   Victoza 1.8 mg daily      METER DOWNLOAD SUMMARY: No data since 05/2019    HISTORY:  Past Medical History:  Past Medical History:  Diagnosis Date  . Diabetes mellitus   . Hyperlipemia   . Hypertension   . Psoriasis    Past Surgical History:  Past Surgical History:  Procedure Laterality Date  . ANAL FISSURE REPAIR    . ENDOMETRIAL ABLATION      Social History:  reports that she has never smoked. She has never used smokeless tobacco. She reports that she does not drink alcohol or use drugs. Family History:  Family History  Problem Relation Age of Onset  . Dementia Mother   . Renal Disease Father   . Diabetes Other      HOME MEDICATIONS: Allergies as of 11/13/2019      Reactions   Oxycodone-acetaminophen Nausea And Vomiting      Medication List    Notice   This visit is during an admission. Changes to the med list made in this visit will be reflected in the After Visit Summary of the admission.      OBJECTIVE:   Vital Signs: There were no vitals taken for this visit.  Wt Readings from Last 3 Encounters:  11/12/19 254 lb 6.6 oz (115.4 kg)  08/07/19 237 lb 9.6 oz (107.8 kg)  05/06/19 234 lb  6.4 oz (106.3 kg)     Exam: General: Pt appears well and is in NAD  Lungs: Clear with good BS bilat with no rales, rhonchi, or wheezes  Heart: RRR with normal S1 and S2 and no gallops; no murmurs; no rub  Extremities: No pretibial edema.  Skin: Normal texture and temperature to palpation  Neuro: MS is good with appropriate affect, pt is alert and Ox3    DM Foot Exam 05/06/2019 The skin of the feet is intact without sores or ulcerations. The pedal pulses are decreased The sensation is decreased to a screening 5.07, 10 gram monofilament bilaterally     In-Office 205  DATA REVIEWED:  Lab Results  Component Value  Date   HGBA1C 10.1 (H) 11/08/2019   HGBA1C 11.7 (A) 08/07/2019   HGBA1C 12.4 (A) 05/06/2019        Results for Whitney Sloan, Whitney Sloan (MRN [id number]) as of 05/06/2019 16:20  Ref. Range 05/06/2019 14:30  Sodium Latest Ref Range: 135 - 145 mEq/L 139  Potassium Latest Ref Range: 3.5 - 5.1 mEq/L 3.5  Chloride Latest Ref Range: 96 - 112 mEq/L 101  CO2 Latest Ref Range: 19 - 32 mEq/L 30  Glucose Latest Ref Range: 70 - 99 mg/dL 221 (H)  BUN Latest Ref Range: 6 - 23 mg/dL 10  Creatinine Latest Ref Range: 0.40 - 1.20 mg/dL 0.54  Calcium Latest Ref Range: 8.4 - 10.5 mg/dL 9.3  GFR Latest Ref Range: >60.00 mL/min 137.58    ASSESSMENT / PLAN / RECOMMENDATIONS:   1) Type2Diabetes Mellitus,Poorlycontrolled, Withneuropathic complications - Most recent A1c of10.1%. Goal A1c <7.0%.   - A1c down from 11.7 % -Poorly controlled diabetes due to medication nonadherence and dietary indiscretions -I have encouraged the daughter to remind the patient to take her medications, daughter admits that patient has dietary indiscretions mainly at night. -Since she had stopped the Invokana we will give her a set dose of NovoLog, apparently the patient had a prescription of NovoLog that I am not aware of. -Patient is interested in trying Rybelsus, I did explain to her that Rybelsus is not on the formulary for Medicaid, we did discuss Victoza as an alternative to Bydureon but she will have to take Victoza daily versus the Bydureon that she takes weekly.  Patient opted to continue with the Bydureon at this time.    MEDICATIONS: -Continue Lantus 50 units daily   - Continue Metformin 1000 mg twice a day  -NovoLog 16 units with each meal  EDUCATION / INSTRUCTIONS:  BG monitoring instructions: Patient is instructed to check her blood sugars 3 times a day, before meals  Call [facility] if: BG persistently < 70 or > 300. . I reviewed the Rule of 15 for the treatment of hypoglycemia in detail with  the patient. Literature supplied.   F/U in 3 months    Signed electronically by: Mack Guise, MD  [facility] Endocrinology  [facility] [address] [place], [place] [address] Phone: [phone] FAX: [phone]   CC: [address], [place] [address] Phone: [phone]  Fax: [phone]  Return to Endocrinology clinic as below: Future Appointments  Date Time Provider [place]  11/13/2019 10:10 AM Pasha Gadison, Melanie Crazier, MD [facility] None

## 2019-11-14 LAB — BASIC METABOLIC PANEL
Anion gap: 8 (ref 5–15)
BUN: 16 mg/dL (ref 8–23)
CO2: 28 mmol/L (ref 22–32)
Calcium: 8.2 mg/dL — ABNORMAL LOW (ref 8.9–10.3)
Chloride: 101 mmol/L (ref 98–111)
Creatinine, Ser: 0.75 mg/dL (ref 0.44–1.00)
GFR calc Af Amer: >60 mL/min (ref >60)
GFR calc non Af Amer: >60 mL/min (ref >60)
Glucose, Bld: 167 mg/dL — ABNORMAL HIGH (ref 70–99)
Potassium: 3.6 mmol/L (ref 3.5–5.1)
Sodium: 137 mmol/L (ref 135–145)

## 2019-11-14 LAB — GLUCOSE, CAPILLARY
Glucose-Capillary: 164 mg/dL — ABNORMAL HIGH (ref 70–99)
Glucose-Capillary: 152 mg/dL — ABNORMAL HIGH (ref 70–99)
Glucose-Capillary: 143 mg/dL — ABNORMAL HIGH (ref 70–99)
Glucose-Capillary: 139 mg/dL — ABNORMAL HIGH (ref 70–99)

## 2019-11-14 MED ORDER — LIVING BETTER WITH HEART FAILURE BOOK: Freq: Once | Status: DC

## 2019-11-14 NOTE — Progress Notes (Signed)
Chaplain responded to Spiritual Consult for notary of AD. Chaplain spoke with daughter, Nehemiah Settle, who reported her mother has not been awake enough since yesterday to comprehend what the AD is all about.  Nehemiah Settle reports her sister is here now and perhaps as the day goes on they will be able to speak with their mother about the AD and help her fill it out.  Invited daughter Debbe Bales to let the nurse know when the AD is completed so nurse can contact Spiritual Care for notary to come.  Vernell Morgans Chaplain Resident

## 2019-11-14 NOTE — Progress Notes (Signed)
Progress Note  Patient Name: Whitney Sloan Date of Encounter: 11/14/2019  Primary Cardiologist: No primary care provider on file.   Subjective   Net negative 1.8L yesterday though also with unmeasured UOP (negative 8.9L on admission).  Creatinine stable at 0.8.  Weight down 4lbs from yesterday.  Reports dyspnea improved, now on room air.  Inpatient Medications    Scheduled Meds: . amLODipine  10 mg Oral Daily  . aspirin EC  81 mg Oral Daily  . atorvastatin  20 mg Oral QHS  . bisacodyl  10 mg Rectal Once  . Chlorhexidine Gluconate Cloth  6 each Topical Daily  . enoxaparin (LOVENOX) injection  40 mg Subcutaneous Q24H  . furosemide  60 mg Intravenous BID  . insulin aspart  0-20 Units Subcutaneous TID AC & HS  . insulin aspart  16 Units Subcutaneous TID WC  . insulin glargine  50 Units Subcutaneous Daily  . lisinopril  40 mg Oral Daily  . Living Better with Heart Failure Book   Does not apply Once  . multivitamin with minerals  1 tablet Oral Daily  . pneumococcal 23 valent vaccine  0.5 mL Intramuscular Tomorrow-1000  . polycarbophil  625 mg Oral Daily  . polyethylene glycol  17 g Oral Daily  . sodium chloride flush  3 mL Intravenous Q12H  . tamsulosin  0.4 mg Oral Daily   Continuous Infusions: . sodium chloride     PRN Meds: sodium chloride, acetaminophen, albuterol, bisacodyl, hydrALAZINE, iohexol, ondansetron (ZOFRAN) IV, sodium chloride flush, zolpidem   Vital Signs    Vitals:   11/13/19 0752 11/13/19 1533 11/13/19 2233 11/14/19 0500  BP: (!) 146/77 106/87 (!) 150/71   Pulse: (!) 108 94 (!) 104   Resp: 16 16 19    Temp: 99.1 F (37.3 C) 98 F (36.7 C) 98.6 F (37 C)   TempSrc:   Oral   SpO2: 94% 93% 96%   Weight:    113.4 kg  Height:        Intake/Output Summary (Last 24 hours) at 11/14/2019 0655 Last data filed at 11/14/2019 0600 Gross per 24 hour  Intake --  Output 1800 ml  Net -1800 ml   Last 3 Weights 11/14/2019 11/12/2019 11/11/2019  Weight (lbs) 250 lb  254 lb 6.6 oz 259 lb 4.2 oz  Weight (kg) 113.4 kg 115.4 kg 117.6 kg      Telemetry    Sinus rhythm 90s-110s- Personally Reviewed  ECG    No new ECG- Personally Reviewed  Physical Exam   GEN: No acute distress.   Neck: No JVD appreciated but difficult toassess given body habitus Cardiac: RRR, no murmurs, rubs, or gallops.  Respiratory: Clear to auscultation bilaterally. GI: Soft, nontender, non-distended  MS: 1+ BLE edema Neuro:  Nonfocal  Psych: Normal affect   Labs    High Sensitivity Troponin:   Recent Labs  Lab 11/08/19 0115 11/08/19 0326  TROPONINIHS 19* 34*      Chemistry Recent Labs  Lab 11/08/19 0115 11/08/19 0604 11/12/19 0041 11/13/19 0218 11/14/19 0239  NA 140   < > 139 139 137  K 3.7   < > 3.5 3.4* 3.6  CL 100   < > 103 99 101  CO2 25   < > 29 29 28   GLUCOSE 324*   < > 134* 130* 167*  BUN 13   < > 17 19 16   CREATININE 0.88   < > 0.72 0.83 0.75  CALCIUM 8.9   < >  8.4* 8.8* 8.2*  PROT 7.7  --   --   --   --   ALBUMIN 3.4*  --   --   --   --   AST 39  --   --   --   --   ALT 24  --   --   --   --   ALKPHOS 77  --   --   --   --   BILITOT 1.0  --   --   --   --   GFRNONAA >60   < > >60 >60 >60  GFRAA >60   < > >60 >60 >60  ANIONGAP 15   < > 7 11 8    < > = values in this interval not displayed.     Hematology Recent Labs  Lab 11/10/19 0509 11/12/19 0041 11/13/19 0218  WBC 13.2* 11.3* 11.9*  RBC 4.57 4.45 4.17  HGB 12.3 12.0 11.1*  HCT 39.7 37.8 35.6*  MCV 86.9 84.9 85.4  MCH 26.9 27.0 26.6  MCHC 31.0 31.7 31.2  RDW 14.8 14.5 14.5  PLT 297 255 271    BNP Recent Labs  Lab 11/08/19 0115  BNP 132.6*     DDimer No results for input(s): DDIMER in the last 168 hours.   Radiology    DG Abd 1 View  Result Date: 11/12/2019 CLINICAL DATA:  Abdominal pain.  Constipation. EXAM: ABDOMEN - 1 VIEW COMPARISON:  None. FINDINGS: Questionable punctate 3 mm calcification over right kidney suggesting right nephrolithiasis. Large amount of  stool noted throughout the colon suggesting constipation. Several slightly prominent air-filled loops of small bowel noted. Follow-up exam suggested to demonstrate resolution. Degenerative changes lumbar spine and both hips. IMPRESSION: 1.  Questionable 3 mm stone right kidney. 2. Large amount of stool noted throughout the colon suggesting constipation. Several slightly prominent air-filled loops of small bowel noted. Follow-up exam suggested to demonstrate resolution. Electronically Signed   By: 01/12/2020  Register   On: 11/12/2019 09:40    Cardiac Studies   TTE 11/08/19:  1. Left ventricular ejection fraction, by estimation, is 50 to 55%. The  left ventricle has low normal function. The left ventricle demonstrates  global hypokinesis. Left ventricular diastolic parameters are consistent  with Grade II diastolic dysfunction  (pseudonormalization). Elevated left ventricular end-diastolic pressure.  2. Right ventricular systolic function is normal. The right ventricular  size is normal.  3. The mitral valve is abnormal. Trivial mitral valve regurgitation.  4. The aortic valve is tricuspid. Aortic valve regurgitation is not  visualized. Mild aortic valve sclerosis is present, with no evidence of  aortic valve stenosis.  5. The inferior vena cava is dilated in size with >50% respiratory  variability, suggesting right atrial pressure of 8 mmHg.   Patient Profile     64 y.o. 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  Assessment & Plan    Acute diastolic heart failure: presented with progressive SOB, LLE, orthopnea, PND for 1  Month.  Echo showed EF 50-55% with G2DD. - Continues to appear hypervolemic on exam, but improving.  Diuresing well on IV lasix 60 mg BID, would continue - Monitor strict I/Os, daily weights, and creatinine with diuresis  Acute respiratory failure with pulmonary edema: likely  2/2 acute diastolic heart failure.  Was on Bipap and nitro infusion in the ED.  Improving with diuresis, now on room  air.  Hypertensive urgency: nitro infusion in the ED. BP improved, now on amlodipine and lisinopril -Continue amlodipine 10 mg daily, lisinopril 40 mg daily  HLD: LDL 101.  Started atorvastatin 20 mg daily  SIRS: Low grade fever and tachycardia.  Normal lactate.  Blood cultures NGTD   For questions or updates, please contact [facility] Please consult [email or web address] for contact info under        Signed, Little Ishikawa, MD  11/14/2019, 6:55 AM

## 2019-11-14 NOTE — Progress Notes (Signed)
PROGRESS NOTE    Whitney Sloan  QPY:[id number] DOB: 11/29/1955 DOA: 11/08/2019 PCP: Emelia Loron, NP   Brief Narrative: Patient is a 51 female with history of diabetes type 2, hyperlipidemia, hypertension, obesity, nonhemorrhagic stroke with residual gait ataxia, slurred speech who presented to the emergency department with complaints of shortness of breath, bilateral lower extremity swelling.  He had to be put on BiPAP on presentation.  He was hypotensive in the emergency department and was found to have acute pulmonary edema.  Started on IV Lasix.  Cardiology consulted and following.  Currently is being managed for acute hypoxic respiratory failure and pulmonary edema due to diastolic heart failure and on IV diuresis.  Hospital course remarkable for urinary retention which has resolved.  Plan for discharge tomorrow to home with oral diuretics.  Assessment & Plan:   Principal Problem:   Acute respiratory failure with hypoxia (HCC) Active Problems:   Uncontrolled type 2 diabetes mellitus with hyperglycemia, with long-term current use of insulin (HCC)   Essential hypertension   Acute cardiogenic pulmonary edema (HCC)   Acute CHF (congestive heart failure) ([place])   Hypertensive crisis   Mixed diabetic hyperlipidemia associated with type 2 diabetes mellitus (HCC)   Acute hypoxic respiratory failure with pulmonary edema: Initially required BiPAP on presentation.  On room air today.  Home desaturation test was done on 11/11/2019, and showed the patient maintained oxygen saturations in the 90s at rest and with ambulation on room air Chest x-ray obtained on 11/11/2019 also shows stable CHF  Acute diastolic congestive heart failure: Echocardiogram showed ejection fraction of 50 to 12%, grade 2 diastolic dysfunction.  Currently on Lasix IV 60 mg twice a day.  Monitor input/output, daily weight.  Cardiology following.  Patient still has significant bilateral lower extremity edema so continue IV  diuresis today.  Hypertensive urgency: Required nitroglycerin drip on admission.  Currently blood pressure is stable.  On amlodipine, lisinopril.  Diabetes type 2: Uncontrolled with hyperglycemia.  On Lantus, sliding scale insulin.  Monitor CBGs  Hyperlipidemia: Continue statin  Insomnia: Currently on Ambien  Urinary retention: Resolved.  Foley removed.  On Flomax  Suspected OSA: Not on CPAP.  Needs sleep study as an outpatient.  Debilitation, deconditioning: PT recommended home health with 24-hour supervision.  Right foot callus: X-ray did not show any fracture.  Wound care consulted.  Constipation: Continue bowel regimen  Fever: One episode.Currently afebrile.  No leukocytosis.  Chest x-ray showed mild stable CHF.  Blood cultures no growth till date.   Nutrition Problem: Altered nutrition lab value Etiology: chronic illness(uncontrolled DM2)      DVT prophylaxis: Lovenox Code Status: Full Family Communication: Daughter present at the bedside Status is: Inpatient  Remains inpatient appropriate because:IV treatments appropriate due to intensity of illness or inability to take PO   Dispo: The patient is from: Home              Anticipated d/c is to: Home              Anticipated d/c date is: 1 day              Patient currently is not medically stable to d/c.  DC tomorrow     Consultants: Cardiology  Procedures:  Antimicrobials:  Anti-infectives (From admission, onward)   None      Subjective: Patient seen and examined at the bedside this morning.  Hemodynamically stable.  Sitting on the chair.  Of oxygen.  Foley was removed.  Very eager to  go home.  But she still has bilateral lower extremity edema requiring IV diuresis today.  Objective: Vitals:   11/13/19 1533 11/13/19 2233 11/14/19 0500 11/14/19 0731  BP: 106/87 (!) 150/71  127/89  Pulse: 94 (!) 104  (!) 102  Resp: 16 19  19   Temp: 98 F (36.7 C) 98.6 F (37 C)  98.9 F (37.2 C)  TempSrc:  Oral     SpO2: 93% 96%  94%  Weight:   113.4 kg   Height:        Intake/Output Summary (Last 24 hours) at 11/14/2019 1329 Last data filed at 11/14/2019 0600 Gross per 24 hour  Intake --  Output 300 ml  Net -300 ml   Filed Weights   11/11/19 0352 11/12/19 0300 11/14/19 0500  Weight: 117.6 kg 115.4 kg 113.4 kg    Examination:   General exam: Comfortable, morbidly obese  Respiratory system: Bilateral equal air entry, normal vesicular breath sounds, no wheezes or crackles  Cardiovascular system: S1 & S2 heard, RRR. No JVD, murmurs, rubs, gallops or clicks. Gastrointestinal system: Abdomen is nondistended, soft and nontender. No organomegaly or masses felt. Normal bowel sounds heard. Central nervous system: Alert and oriented. No focal neurological deficits. Extremities: 2+ lateral lower extremity edema, no clubbing ,no cyanosis Skin: No rashes, lesions or ulcers,no icterus ,no pallor  Data Reviewed: I have personally reviewed following labs and imaging studies  CBC: Recent Labs  Lab 11/08/19 0115 11/08/19 0115 11/08/19 0604 11/09/19 0442 11/10/19 0509 11/12/19 0041 11/13/19 0218  WBC 14.4*  --   --  15.7* 13.2* 11.3* 11.9*  NEUTROABS 6.2  --   --   --   --   --   --   HGB 13.3   < > 13.3 12.5 12.3 12.0 11.1*  HCT 43.1   < > 39.0 39.6 39.7 37.8 35.6*  MCV 87.8  --   --  86.3 86.9 84.9 85.4  PLT 338  --   --  311 297 255 271   < > = values in this interval not displayed.   Basic Metabolic Panel: Recent Labs  Lab 11/10/19 0509 11/11/19 0323 11/12/19 0041 11/12/19 0933 11/13/19 0218 11/14/19 0239  NA 142 141 139  --  139 137  K 3.6 3.5 3.5  --  3.4* 3.6  CL 104 99 103  --  99 101  CO2 30 29 29   --  29 28  GLUCOSE 161* 238* 134*  --  130* 167*  BUN 17 22 17   --  19 16  CREATININE 0.66 0.79 0.72  --  0.83 0.75  CALCIUM 8.8* 8.6* 8.4*  --  8.8* 8.2*  MG  --   --   --  2.0  --   --    GFR: Estimated Creatinine Clearance: 89.3 mL/min (by C-G formula based on SCr of 0.75  mg/dL). Liver Function Tests: Recent Labs  Lab 11/08/19 0115  AST 39  ALT 24  ALKPHOS 77  BILITOT 1.0  PROT 7.7  ALBUMIN 3.4*   No results for input(s): LIPASE, AMYLASE in the last 168 hours. No results for input(s): AMMONIA in the last 168 hours. Coagulation Profile: No results for input(s): INR, PROTIME in the last 168 hours. Cardiac Enzymes: No results for input(s): CKTOTAL, CKMB, CKMBINDEX, TROPONINI in the last 168 hours. BNP (last 3 results) No results for input(s): PROBNP in the last 8760 hours. HbA1C: No results for input(s): HGBA1C in the last 72 hours. CBG: Recent Labs  Lab 11/13/19 1123 11/13/19 1628 11/13/19 2103 11/14/19 0727 11/14/19 1207  GLUCAP 127* 131* 152* 164* 152*   Lipid Profile: Recent Labs    11/13/19 0218  CHOL 168  HDL 48  LDLCALC 101*  TRIG 97  CHOLHDL 3.5   Thyroid Function Tests: No results for input(s): TSH, T4TOTAL, FREET4, T3FREE, THYROIDAB in the last 72 hours. Anemia Panel: No results for input(s): VITAMINB12, FOLATE, FERRITIN, TIBC, IRON, RETICCTPCT in the last 72 hours. Sepsis Labs: Recent Labs  Lab 11/12/19 0041  LATICACIDVEN 0.8    Recent Results (from the past 240 hour(s))  SARS Coronavirus 2 by RT PCR (hospital order, performed in [facility] hospital lab) Nasopharyngeal Nasopharyngeal Swab     Status: None   Collection Time: 11/08/19  1:15 AM   Specimen: Nasopharyngeal Swab  Result Value Ref Range Status   SARS Coronavirus 2 NEGATIVE NEGATIVE Final    Comment: (NOTE) SARS-CoV-2 target nucleic acids are NOT DETECTED. The SARS-CoV-2 RNA is generally detectable in upper and lower respiratory specimens during the acute phase of infection. The lowest concentration of SARS-CoV-2 viral copies this assay can detect is 250 copies / mL. A negative result does not preclude SARS-CoV-2 infection and should not be used as the sole basis for treatment or other patient management decisions.  A negative result may occur  with improper specimen collection / handling, submission of specimen other than nasopharyngeal swab, presence of viral mutation(s) within the areas targeted by this assay, and inadequate number of viral copies (<250 copies / mL). A negative result must be combined with clinical observations, patient history, and epidemiological information. Fact Sheet for Patients:   BoilerBrush.com.cy Fact Sheet for Healthcare Providers: https://pope.com/ This test is not yet approved or cleared  by the [place] FDA and has been authorized for detection and/or diagnosis of SARS-CoV-2 by FDA under an Emergency Use Authorization (EUA).  This EUA will remain in effect (meaning this test can be used) for the duration of the COVID-19 declaration under Section 564(b)(1) of the Act, 21 U.S.C. section 360bbb-3(b)(1), unless the authorization is terminated or revoked sooner. Performed at [facility], [address] [place], [place] [address]   Culture, blood (routine x 2)     Status: None (Preliminary result)   Collection Time: 11/12/19 12:37 AM   Specimen: BLOOD  Result Value Ref Range Status   Specimen Description BLOOD LEFT ANTECUBITAL  Final   Special Requests   Final    BOTTLES DRAWN AEROBIC ONLY Blood Culture results may not be optimal due to an inadequate volume of blood received in culture bottles   Culture   Final    NO GROWTH 2 DAYS Performed at [facility], [address], [place], [place] [address]    Report Status PENDING  Incomplete  Culture, blood (routine x 2)     Status: None (Preliminary result)   Collection Time: 11/12/19 12:41 AM   Specimen: BLOOD  Result Value Ref Range Status   Specimen Description BLOOD RIGHT ANTECUBITAL  Final   Special Requests   Final    BOTTLES DRAWN AEROBIC AND ANAEROBIC Blood Culture results may not be optimal due to an inadequate volume of blood received in culture bottles   Culture   Final     NO GROWTH 2 DAYS Performed at [facility], [address], [place], [place] [address]    Report Status PENDING  Incomplete         Radiology Studies: No results  found.      Scheduled Meds: . amLODipine  10 mg Oral Daily  . aspirin EC  81 mg Oral Daily  . atorvastatin  20 mg Oral QHS  . bisacodyl  10 mg Rectal Once  . Chlorhexidine Gluconate Cloth  6 each Topical Daily  . enoxaparin (LOVENOX) injection  40 mg Subcutaneous Q24H  . furosemide  60 mg Intravenous BID  . insulin aspart  0-20 Units Subcutaneous TID AC & HS  . insulin aspart  16 Units Subcutaneous TID WC  . insulin glargine  50 Units Subcutaneous Daily  . lisinopril  40 mg Oral Daily  . Living Better with Heart Failure Book   Does not apply Once  . multivitamin with minerals  1 tablet Oral Daily  . pneumococcal 23 valent vaccine  0.5 mL Intramuscular Tomorrow-1000  . polycarbophil  625 mg Oral Daily  . polyethylene glycol  17 g Oral Daily  . sodium chloride flush  3 mL Intravenous Q12H  . tamsulosin  0.4 mg Oral Daily   Continuous Infusions: . sodium chloride       LOS: 6 days    Time spent: 25 mins.More than 50% of that time was spent in counseling and/or coordination of care.      Burnadette Pop, MD [facility] P6/08/2019, 1:29 PM

## 2019-11-14 NOTE — Plan of Care (Signed)
Exit care not given

## 2019-11-14 NOTE — Progress Notes (Signed)
Physical Therapy Treatment Patient Details Name: Whitney Sloan MRN: [id number] DOB: 24-Nov-1955 Today's Date: 11/14/2019    History of Present Illness 64 year old female with past medical history of diabetes mellitus type 2, hyperlipidemia, hypertension, obesity and previous stroke in 2011 with residual gait ataxia and slurring of speech who presents to [facility] emergency department 11/08/19 due to shortness of breath. This has progressively worsened over ~4 weeks PTA. Required BiPAP <24 hrs. Newly diagnosed CHF. Hypertensive urgency    PT Comments    Pt demonstrating good progress.  Able to progress gait distance and stairs on RA with stable O2 and 1/4 DOE.  Will cont to benefit from PT while hospitalized to progress mobility, endurance, balance, and to LRAD.  Cont POC.     Follow Up Recommendations  Home health PT;Supervision - Intermittent     Equipment Recommendations  None recommended by PT    Recommendations for Other Services       Precautions / Restrictions Precautions Precautions: Fall    Mobility  Bed Mobility               General bed mobility comments: oob in recliner upon arrival  Transfers Overall transfer level: Needs assistance Equipment used: Rolling walker (2 wheeled) Transfers: Sit to/from Stand Sit to Stand: Supervision         General transfer comment: cues for first sit to stand but then performed safely on subsequent trials  Ambulation/Gait Ambulation/Gait assistance: Supervision Gait Distance (Feet): 300 Feet Assistive device: Rolling walker (2 wheeled) Gait Pattern/deviations: Step-through pattern     General Gait Details: Steady without LOB with RW; HR 90's bpm; on RA and sats maintained 98%   Stairs Stairs: Yes Stairs assistance: Min guard Stair Management: One rail Right;Step to pattern Number of Stairs: 2 General stair comments: min cues for safety   Wheelchair Mobility    Modified Rankin (Stroke Patients  Only)       Balance     Sitting balance-Leahy Scale: Good     Standing balance support: No upper extremity supported;During functional activity Standing balance-Leahy Scale: Good               High level balance activites: Side stepping;Backward walking              Cognition Arousal/Alertness: Awake/alert Behavior During Therapy: WFL for tasks assessed/performed Overall Cognitive Status: Within Functional Limits for tasks assessed                                        Exercises General Exercises - Lower Extremity Hip Flexion/Marching: AROM;Both;10 reps;Standing Heel Raises: AROM;Both;10 reps;Standing Other Exercises Other Exercises: sit to stand x 10 with cues for hand placement and supervision    General Comments        Pertinent Vitals/Pain Pain Assessment: No/denies pain    Home Living                      Prior Function            PT Goals (current goals can now be found in the care plan section) Progress towards PT goals: Progressing toward goals    Frequency    Min 3X/week      PT Plan Current plan remains appropriate    Co-evaluation              AM-PAC PT "6 Clicks"  Mobility   Outcome Measure  Help needed turning from your back to your side while in a flat bed without using bedrails?: None Help needed moving from lying on your back to sitting on the side of a flat bed without using bedrails?: None Help needed moving to and from a bed to a chair (including a wheelchair)?: None Help needed standing up from a chair using your arms (e.g., wheelchair or bedside chair)?: None Help needed to walk in hospital room?: None Help needed climbing 3-5 steps with a railing? : A Little 6 Click Score: 23    End of Session Equipment Utilized During Treatment: Gait belt Activity Tolerance: Patient tolerated treatment well Patient left: in chair;with call bell/phone within reach Nurse Communication: Mobility  status PT Visit Diagnosis: Muscle weakness (generalized) (M62.81);Difficulty in walking, not elsewhere classified (R26.2)     Time: 9244-6286 PT Time Calculation (min) (ACUTE ONLY): 23 min  Charges:  $Gait Training: 8-22 mins $Therapeutic Exercise: 8-22 mins                     Maggie Font, PT Acute Rehab Services Pager [phone] [facility] [phone]    Karlton Lemon 11/14/2019, 11:57 AM

## 2019-11-15 LAB — BASIC METABOLIC PANEL
Anion gap: 12 (ref 5–15)
BUN: 14 mg/dL (ref 8–23)
CO2: 28 mmol/L (ref 22–32)
Calcium: 8.9 mg/dL (ref 8.9–10.3)
Chloride: 101 mmol/L (ref 98–111)
Creatinine, Ser: 0.77 mg/dL (ref 0.44–1.00)
GFR calc Af Amer: >60 mL/min (ref >60)
GFR calc non Af Amer: >60 mL/min (ref >60)
Glucose, Bld: 141 mg/dL — ABNORMAL HIGH (ref 70–99)
Potassium: 3.8 mmol/L (ref 3.5–5.1)
Sodium: 141 mmol/L (ref 135–145)

## 2019-11-15 LAB — GLUCOSE, CAPILLARY
Glucose-Capillary: 158 mg/dL — ABNORMAL HIGH (ref 70–99)
Glucose-Capillary: 184 mg/dL — ABNORMAL HIGH (ref 70–99)

## 2019-11-15 MED ORDER — FUROSEMIDE 40 MG PO TABS: 40.0000 mg | ORAL_TABLET | Freq: Every day | ORAL | 1 refills | Status: DC

## 2019-11-15 MED ORDER — ATORVASTATIN CALCIUM 20 MG PO TABS: 20.0000 mg | ORAL_TABLET | Freq: Every day | ORAL | 1 refills | Status: DC

## 2019-11-15 MED ORDER — TAMSULOSIN HCL 0.4 MG PO CAPS: 0.4000 mg | ORAL_CAPSULE | Freq: Every day | ORAL | 1 refills | Status: DC

## 2019-11-15 MED ORDER — FUROSEMIDE 40 MG PO TABS: 40.0000 mg | ORAL_TABLET | Freq: Every day | ORAL | Status: DC

## 2019-11-15 MED ORDER — LISINOPRIL 40 MG PO TABS: 40.0000 mg | ORAL_TABLET | Freq: Every day | ORAL | 1 refills | Status: DC

## 2019-11-15 MED ORDER — AMLODIPINE BESYLATE 10 MG PO TABS: 10.0000 mg | ORAL_TABLET | Freq: Every day | ORAL | 1 refills | Status: DC

## 2019-11-15 NOTE — Discharge Summary (Signed)
Physician Discharge Summary  Whitney Sloan QIH:[id number] DOB: Oct 16, 1955 DOA: 11/08/2019  PCP: Carmel Sacramento, NP  Admit date: 11/08/2019 Discharge date: 11/15/2019  Admitted From: Home Disposition:  Home  Discharge Condition:Stable CODE STATUS:FULL Diet recommendation: Heart Healthy   Brief/Interim Summary: Patient is a 64 female with history of diabetes type 2, hyperlipidemia, hypertension, obesity, nonhemorrhagic stroke with residual gait ataxia, slurred speech who presented to the emergency department with complaints of shortness of breath, bilateral lower extremity swelling.  He had to be put on BiPAP on presentation.  He was hypotensive in the emergency department and was found to have acute pulmonary edema.  Started on IV Lasix.  Cardiology consulted and was following.  Currently is being managed for acute hypoxic respiratory failure and pulmonary edema due to diastolic heart failure and on IV diuresis.  Hospital course remarkable for urinary retention which has resolved.  Plan is for discharge today to home with oral diuretics.  Following problems were addressed during her hospitalization:  Acute hypoxic respiratory failure with pulmonary edema: Initially required BiPAP on presentation.  Now on room air. Chest x-ray obtained on 11/11/2019 showed  stable CHF  Acute diastolic congestive heart failure: Echocardiogram showed ejection fraction of 50 to 55%, grade 2 diastolic dysfunction.  She was  on Lasix IV 60 mg twice a day.  Change to Lasix 40 mg daily on discharge.  Follow-up with cardiology as an outpatient  Hypertensive urgency: Required nitroglycerin drip on admission.  Currently blood pressure is stable.  On amlodipine, lisinopril.  Diabetes type 2:   On Lantus, NovoLog, Metformin at home  Hyperlipidemia: Continue statin  Urinary retention: Resolved.  Foley removed.  On Flomax  Suspected OSA: Not on CPAP.  Needs sleep study as an outpatient.  Debilitation, deconditioning:  PT recommended home health with 24-hour supervision.  Constipation: Continue bowel regimen  Fever: One episode.Currently afebrile.  No leukocytosis.  Chest x-ray showed mild stable CHF.  Blood cultures no growth till date.  Discharge Diagnoses:  Principal Problem:   Acute respiratory failure with hypoxia (HCC) Active Problems:   Uncontrolled type 2 diabetes mellitus with hyperglycemia, with long-term current use of insulin (HCC)   Essential hypertension   Acute cardiogenic pulmonary edema (HCC)   Acute CHF (congestive heart failure) (HCC)   Hypertensive crisis   Mixed diabetic hyperlipidemia associated with type 2 diabetes mellitus [facility])    Discharge Instructions  Discharge Instructions    Diet - low sodium heart healthy   Complete by: As directed    Discharge instructions   Complete by: As directed    1)Please take prescribed medications as instructed. 2)Follow with your PCP in a week.  Do a BMP test during the follow-up. 3)Monitor your weight .   Increase activity slowly   Complete by: As directed      Allergies as of 11/15/2019      Reactions   Oxycodone-acetaminophen Nausea And Vomiting      Medication List    STOP taking these medications   amLODipine-benazepril 10-40 MG capsule Commonly known as: LOTREL   nystatin cream Commonly known as: MYCOSTATIN   nystatin powder Commonly known as: MYCOSTATIN/NYSTOP   simvastatin 20 MG tablet Commonly known as: ZOCOR   Victoza 18 MG/3ML Sopn Generic drug: liraglutide     TAKE these medications   amLODipine 10 MG tablet Commonly known as: NORVASC Take 1 tablet (10 mg total) by mouth daily. Start taking on: November 16, 2019 What changed: additional instructions   Aspir-Low 81 MG EC  tablet Generic drug: aspirin Take 81 mg by mouth every morning.   atorvastatin 20 MG tablet Commonly known as: LIPITOR Take 1 tablet (20 mg total) by mouth at bedtime.   ezetimibe 10 MG tablet Commonly known as: ZETIA Take 10  mg by mouth at bedtime.   furosemide 40 MG tablet Commonly known as: LASIX Take 1 tablet (40 mg total) by mouth daily. What changed:   medication strength  how much to take  when to take this  reasons to take this   ibuprofen 200 MG tablet Commonly known as: ADVIL Take 200 mg by mouth every 6 (six) hours as needed for fever, headache or moderate pain.   insulin glargine 100 UNIT/ML injection Commonly known as: LANTUS Inject 0.5 mLs (50 Units total) into the skin at bedtime. E11.65   lisinopril 40 MG tablet Commonly known as: ZESTRIL Take 1 tablet (40 mg total) by mouth daily.   metFORMIN 500 MG tablet Commonly known as: GLUCOPHAGE Take 1,000 mg by mouth 2 (two) times daily. What changed: Another medication with the same name was removed. Continue taking this medication, and follow the directions you see here.   NovoLOG FlexPen 100 UNIT/ML FlexPen Generic drug: insulin aspart Inject 16 Units into the skin 3 (three) times daily with meals.   tamsulosin 0.4 MG Caps capsule Commonly known as: FLOMAX Take 1 capsule (0.4 mg total) by mouth daily. Start taking on: November 16, 2019   Unifine Pentips 31G X 6 MM Misc Generic drug: Insulin Pen Needle USE TO INJECT INSULIN   VITAMIN D PO Take 1 capsule by mouth daily.            Durable Medical Equipment  (From admission, onward)         Start     Ordered   11/12/19 1223  For home use only DME Walker rolling  Once    Question Answer Comment  Walker: With 5 Inch Wheels   Patient needs a walker to treat with the following condition Physical deconditioning      11/12/19 1222   11/12/19 1223  For home use only DME 3 n 1  Once     11/12/19 1222         Follow-up Information    Carmel Sacramento, NP. Schedule an appointment as soon as possible for a visit in 1 week(s).   Specialty: Nurse Practitioner Contact information: [address] [place] [address] [phone]          Allergies  Allergen  Reactions  . Oxycodone-Acetaminophen Nausea And Vomiting    Consultations:  Cardiology   Procedures/Studies: DG Abd 1 View  Result Date: 11/12/2019 CLINICAL DATA:  Abdominal pain.  Constipation. EXAM: ABDOMEN - 1 VIEW COMPARISON:  None. FINDINGS: Questionable punctate 3 mm calcification over right kidney suggesting right nephrolithiasis. Large amount of stool noted throughout the colon suggesting constipation. Several slightly prominent air-filled loops of small bowel noted. Follow-up exam suggested to demonstrate resolution. Degenerative changes lumbar spine and both hips. IMPRESSION: 1.  Questionable 3 mm stone right kidney. 2. Large amount of stool noted throughout the colon suggesting constipation. Several slightly prominent air-filled loops of small bowel noted. Follow-up exam suggested to demonstrate resolution. Electronically Signed   By: Maisie Fus  Register   On: 11/12/2019 09:40   DG CHEST PORT 1 VIEW  Result Date: 11/11/2019 CLINICAL DATA:  Dyspnea EXAM: PORTABLE CHEST 1 VIEW COMPARISON:  11/08/2019 chest radiograph. FINDINGS: Stable cardiomediastinal silhouette with mild cardiomegaly. No pneumothorax. No significant pleural  effusions. Mild pulmonary edema, similar. IMPRESSION: Stable mild congestive heart failure. Electronically Signed   By: Delbert Phenix M.D.   On: 11/11/2019 11:20   DG Chest Port 1 View  Result Date: 11/08/2019 CLINICAL DATA:  Shortness of breath EXAM: PORTABLE CHEST 1 VIEW COMPARISON:  09/21/2011 FINDINGS: There is mild cardiomegaly with pulmonary vascular congestion. No overt edema. No pleural effusion or pneumothorax. No focal consolidation. IMPRESSION: Cardiomegaly and pulmonary vascular congestion without overt edema. Electronically Signed   By: Deatra Robinson M.D.   On: 11/08/2019 01:43   DG Foot Complete Right  Result Date: 11/08/2019 CLINICAL DATA:  Soft tissue swelling at the great toe without healing. Hit toe on door recently. EXAM: RIGHT FOOT COMPLETE - 3+  VIEW COMPARISON:  None. FINDINGS: Soft tissue swelling is present over the dorsum of foot at the MTP joints. No acute or healing fracture is present. IMPRESSION: Soft tissue swelling over the dorsum of the foot without underlying fracture. Electronically Signed   By: Marin Roberts M.D.   On: 11/08/2019 10:09   ECHOCARDIOGRAM COMPLETE  Result Date: 11/08/2019    ECHOCARDIOGRAM REPORT   Patient Name:   Whitney Sloan Date of Exam: 11/08/2019 Medical Rec #:  [id number]       Height:       65.0 in Accession #:    [id number]      Weight:       237.6 lb Date of Birth:  12-Nov-1955        BSA:          2.128 m Patient Age:    64 years        BP:           135/79 mmHg Patient Gender: F               HR:           102 bpm. Exam Location:  Inpatient Procedure: 2D Echo Indications:    acute systolic chf 428.21  History:        Patient has prior history of Echocardiogram examinations, most                 recent 03/30/2009. Signs/Symptoms:Shortness of Breath and lower                 extremity edema; Risk Factors:Hypertension, Dyslipidemia and                 Diabetes.  Sonographer:    Delcie Roch Referring Phys: [id number] Marinda Elk  Sonographer Comments: Patient is morbidly obese. IMPRESSIONS  1. Left ventricular ejection fraction, by estimation, is 50 to 55%. The left ventricle has low normal function. The left ventricle demonstrates global hypokinesis. Left ventricular diastolic parameters are consistent with Grade II diastolic dysfunction (pseudonormalization). Elevated left ventricular end-diastolic pressure.  2. Right ventricular systolic function is normal. The right ventricular size is normal.  3. The mitral valve is abnormal. Trivial mitral valve regurgitation.  4. The aortic valve is tricuspid. Aortic valve regurgitation is not visualized. Mild aortic valve sclerosis is present, with no evidence of aortic valve stenosis.  5. The inferior vena cava is dilated in size with >50% respiratory  variability, suggesting right atrial pressure of 8 mmHg. FINDINGS  Left Ventricle: Left ventricular ejection fraction, by estimation, is 50 to 55%. The left ventricle has low normal function. The left ventricle demonstrates global hypokinesis. The left ventricular internal cavity size was normal in size. There is no left ventricular hypertrophy.  Left ventricular diastolic parameters are consistent with Grade II diastolic dysfunction (pseudonormalization). Elevated left ventricular end-diastolic pressure. Right Ventricle: The right ventricular size is normal. No increase in right ventricular wall thickness. Right ventricular systolic function is normal. Left Atrium: Left atrial size was normal in size. Right Atrium: Right atrial size was normal in size. Pericardium: There is no evidence of pericardial effusion. Mitral Valve: The mitral valve is abnormal. There is mild thickening of the mitral valve leaflet(s). Trivial mitral valve regurgitation. Tricuspid Valve: The tricuspid valve is grossly normal. Tricuspid valve regurgitation is trivial. Aortic Valve: The aortic valve is tricuspid. Aortic valve regurgitation is not visualized. Mild aortic valve sclerosis is present, with no evidence of aortic valve stenosis. Pulmonic Valve: The pulmonic valve was normal in structure. Pulmonic valve regurgitation is not visualized. Aorta: The aortic root and ascending aorta are structurally normal, with no evidence of dilitation. Venous: The inferior vena cava is dilated in size with greater than 50% respiratory variability, suggesting right atrial pressure of 8 mmHg. IAS/Shunts: No atrial level shunt detected by color flow Doppler.  LEFT VENTRICLE PLAX 2D LVIDd:         5.50 cm     Diastology LVIDs:         4.10 cm     LV e' lateral:   6.31 cm/s LV PW:         1.00 cm     LV E/e' lateral: 21.2 LV IVS:        0.90 cm     LV e' medial:    4.79 cm/s LVOT diam:     2.00 cm     LV E/e' medial:  28.0 LV SV:         78 LV SV Index:   36  LVOT Area:     3.14 cm  LV Volumes (MOD) LV vol d, MOD A4C: 80.1 ml LV vol s, MOD A4C: 50.0 ml LV SV MOD A4C:     80.1 ml RIGHT VENTRICLE             IVC RV S prime:     13.70 cm/s  IVC diam: 2.50 cm TAPSE (M-mode): 1.7 cm LEFT ATRIUM             Index LA diam:        4.20 cm 1.97 cm/m LA Vol (A2C):   69.2 ml 32.51 ml/m LA Vol (A4C):   57.9 ml 27.20 ml/m LA Biplane Vol: 66.7 ml 31.34 ml/m  AORTIC VALVE LVOT Vmax:   136.00 cm/s LVOT Vmean:  89.800 cm/s LVOT VTI:    0.247 m  AORTA Ao Root diam: 3.10 cm MITRAL VALVE MV Area (PHT): 5.97 cm     SHUNTS MV Decel Time: 127 msec     Systemic VTI:  0.25 m MV E velocity: 134.00 cm/s  Systemic Diam: 2.00 cm MV A velocity: 97.40 cm/s MV E/A ratio:  1.38 Zoila Shutter MD Electronically signed by Zoila Shutter MD Signature Date/Time: 11/08/2019/11:01:57 AM    Final        Subjective: Patient seen and examined at the bedside this morning.  Hemodynamically stable for discharge to home today.  Discharge Exam: Vitals:   11/14/19 2306 11/15/19 0759  BP: 128/82 129/73  Pulse: 95 99  Resp: 18 19  Temp: 98.2 F (36.8 C) 98.5 F (36.9 C)  SpO2: 97% 94%   Vitals:   11/14/19 1619 11/14/19 2306 11/15/19 0508 11/15/19 0759  BP: 124/75 128/82  129/73  Pulse: 94 95  99  Resp: 18 18  19   Temp: 98.3 F (36.8 C) 98.2 F (36.8 C)  98.5 F (36.9 C)  TempSrc:  Oral    SpO2: 96% 97%  94%  Weight:   102.4 kg   Height:        General: Pt is alert, awake, not in acute distress Cardiovascular: RRR, S1/S2 +, no rubs, no gallops Respiratory: CTA bilaterally, no wheezing, no rhonchi Abdominal: Soft, NT, ND, bowel sounds + Extremities: 1-2+ bilateral lower extremity edema, no cyanosis    The results of significant diagnostics from this hospitalization (including imaging, microbiology, ancillary and laboratory) are listed below for reference.     Microbiology: Recent Results (from the past 240 hour(s))  SARS Coronavirus 2 by RT PCR (hospital order, performed  in [facility] hospital lab) Nasopharyngeal Nasopharyngeal Swab     Status: None   Collection Time: 11/08/19  1:15 AM   Specimen: Nasopharyngeal Swab  Result Value Ref Range Status   SARS Coronavirus 2 NEGATIVE NEGATIVE Final    Comment: (NOTE) SARS-CoV-2 target nucleic acids are NOT DETECTED. The SARS-CoV-2 RNA is generally detectable in upper and lower respiratory specimens during the acute phase of infection. The lowest concentration of SARS-CoV-2 viral copies this assay can detect is 250 copies / mL. A negative result does not preclude SARS-CoV-2 infection and should not be used as the sole basis for treatment or other patient management decisions.  A negative result may occur with improper specimen collection / handling, submission of specimen other than nasopharyngeal swab, presence of viral mutation(s) within the areas targeted by this assay, and inadequate number of viral copies (<250 copies / mL). A negative result must be combined with clinical observations, patient history, and epidemiological information. Fact Sheet for Patients:   11/10/19 Fact Sheet for Healthcare Providers: BoilerBrush.com.cy This test is not yet approved or cleared  by the https://pope.com/ FDA and has been authorized for detection and/or diagnosis of SARS-CoV-2 by FDA under an Emergency Use Authorization (EUA).  This EUA will remain in effect (meaning this test can be used) for the duration of the COVID-19 declaration under Section 564(b)(1) of the Act, 21 U.S.C. section 360bbb-3(b)(1), unless the authorization is terminated or revoked sooner. Performed at [facility], [address], [place], [place]   Culture, blood (routine x 2)     Status: None (Preliminary result)   Collection Time: 11/12/19 12:37 AM   Specimen: BLOOD  Result Value Ref Range Status   Specimen Description BLOOD LEFT ANTECUBITAL  Final   Special Requests    Final    BOTTLES DRAWN AEROBIC ONLY Blood Culture results may not be optimal due to an inadequate volume of blood received in culture bottles   Culture   Final    NO GROWTH 2 DAYS Performed at [facility], [address], [place], [place]    Report Status PENDING  Incomplete  Culture, blood (routine x 2)     Status: None (Preliminary result)   Collection Time: 11/12/19 12:41 AM   Specimen: BLOOD  Result Value Ref Range Status   Specimen Description BLOOD RIGHT ANTECUBITAL  Final   Special Requests   Final    BOTTLES DRAWN AEROBIC AND ANAEROBIC Blood Culture results may not be optimal due to an inadequate volume of blood received in culture bottles   Culture   Final    NO GROWTH 2 DAYS Performed at [facility], [address] [place], [place] [address]    Report Status PENDING  Incomplete     Labs: BNP (last 3 results) Recent Labs    11/08/19 0115  BNP 132.6*   Basic Metabolic Panel: Recent Labs  Lab 11/11/19 0323 11/12/19 0041 11/12/19 0933 11/13/19 0218 11/14/19 0239 11/15/19 0247  NA 141 139  --  139 137 141  K 3.5 3.5  --  3.4* 3.6 3.8  CL 99 103  --  99 101 101  CO2 29 29  --  GLUCOSE 238* 134*  --  130* 167* 141*  BUN 22 17  --  CREATININE 0.79 0.72  --  0.83 0.75 0.77  CALCIUM 8.6* 8.4*  --  8.8* 8.2* 8.9  MG  --   --  2.0  --   --   --    Liver Function Tests: No results for input(s): AST, ALT, ALKPHOS, BILITOT, PROT, ALBUMIN in the last 168 hours. No results for input(s): LIPASE, AMYLASE in the last 168 hours. No results for input(s): AMMONIA in the last 168 hours. CBC: Recent Labs  Lab 11/09/19 0442 11/10/19 0509 11/12/19 0041 11/13/19 0218  WBC 15.7* 13.2* 11.3* 11.9*  HGB 12.5 12.3 12.0 11.1*  HCT 39.6 39.7 37.8 35.6*  MCV 86.3 86.9 84.9 85.4  PLT 311 297 255 271   Cardiac Enzymes: No results for input(s): CKTOTAL, CKMB, CKMBINDEX, TROPONINI in the last 168 hours. BNP: Invalid input(s):  POCBNP CBG: Recent Labs  Lab 11/14/19 0727 11/14/19 1207 11/14/19 1616 11/14/19 2022 11/15/19 0727  GLUCAP 164* 152* 143* 139* 158*   D-Dimer No results for input(s): DDIMER in the last 72 hours. Hgb A1c No results for input(s): HGBA1C in the last 72 hours. Lipid Profile Recent Labs    11/13/19 0218  CHOL 168  HDL 48  LDLCALC 101*  TRIG 97  CHOLHDL 3.5   Thyroid function studies No results for input(s): TSH, T4TOTAL, T3FREE, THYROIDAB in the last 72 hours.  Invalid input(s): FREET3 Anemia work up No results for input(s): VITAMINB12, FOLATE, FERRITIN, TIBC, IRON, RETICCTPCT in the last 72 hours. Urinalysis    Component Value Date/Time   COLORURINE YELLOW 11/11/2019 2331   APPEARANCEUR HAZY (A) 11/11/2019 2331   LABSPEC 1.018 11/11/2019 2331   PHURINE 5.0 11/11/2019 2331   GLUCOSEU NEGATIVE 11/11/2019 2331   HGBUR LARGE (A) 11/11/2019 2331   BILIRUBINUR NEGATIVE 11/11/2019 2331   KETONESUR NEGATIVE 11/11/2019 2331   PROTEINUR 100 (A) 11/11/2019 2331   UROBILINOGEN 0.2 10/01/2016 1648   NITRITE NEGATIVE 11/11/2019 2331   LEUKOCYTESUR NEGATIVE 11/11/2019 2331   Sepsis Labs Invalid input(s): PROCALCITONIN,  WBC,  LACTICIDVEN Microbiology Recent Results (from the past 240 hour(s))  SARS Coronavirus 2 by RT PCR (hospital order, performed in [facility] hospital lab) Nasopharyngeal Nasopharyngeal Swab     Status: None   Collection Time: 11/08/19  1:15 AM   Specimen: Nasopharyngeal Swab  Result Value Ref Range Status   SARS Coronavirus 2 NEGATIVE NEGATIVE Final    Comment: (NOTE) SARS-CoV-2 target nucleic acids are NOT DETECTED. The SARS-CoV-2 RNA is generally detectable in upper and lower respiratory specimens during the acute phase of infection. The lowest concentration of SARS-CoV-2 viral copies this assay can detect is 250 copies / mL. A negative result does not preclude SARS-CoV-2 infection and should not be used as the sole basis for treatment or  other patient management decisions.  A negative result may occur with improper  specimen collection / handling, submission of specimen other than nasopharyngeal swab, presence of viral mutation(s) within the areas targeted by this assay, and inadequate number of viral copies (<250 copies / mL). A negative result must be combined with clinical observations, patient history, and epidemiological information. Fact Sheet for Patients:   StrictlyIdeas.no Fact Sheet for Healthcare Providers: BankingDealers.co.za This test is not yet approved or cleared  by the [place] FDA and has been authorized for detection and/or diagnosis of SARS-CoV-2 by FDA under an Emergency Use Authorization (EUA).  This EUA will remain in effect (meaning this test can be used) for the duration of the COVID-19 declaration under Section 564(b)(1) of the Act, 21 U.S.C. section 360bbb-3(b)(1), unless the authorization is terminated or revoked sooner. Performed at [facility], [place] [address], [place], [place] [address]   Culture, blood (routine x 2)     Status: None (Preliminary result)   Collection Time: 11/12/19 12:37 AM   Specimen: BLOOD  Result Value Ref Range Status   Specimen Description BLOOD LEFT ANTECUBITAL  Final   Special Requests   Final    BOTTLES DRAWN AEROBIC ONLY Blood Culture results may not be optimal due to an inadequate volume of blood received in culture bottles   Culture   Final    NO GROWTH 2 DAYS Performed at [facility], [place] [address], [place], [place] [address]    Report Status PENDING  Incomplete  Culture, blood (routine x 2)     Status: None (Preliminary result)   Collection Time: 11/12/19 12:41 AM   Specimen: BLOOD  Result Value Ref Range Status   Specimen Description BLOOD RIGHT ANTECUBITAL  Final   Special Requests   Final    BOTTLES DRAWN AEROBIC AND ANAEROBIC Blood Culture results may not be optimal due to an  inadequate volume of blood received in culture bottles   Culture   Final    NO GROWTH 2 DAYS Performed at [facility], [place] [address] [place], [place] [address]    Report Status PENDING  Incomplete    Please note: You were cared for by a hospitalist during your hospital stay. Once you are discharged, your primary care physician will handle any further medical issues. Please note that NO REFILLS for any discharge medications will be authorized once you are discharged, as it is imperative that you return to your primary care physician (or establish a relationship with a primary care physician if you do not have one) for your post hospital discharge needs so that they can reassess your need for medications and monitor your lab values.    Time coordinating discharge: 40 minutes  SIGNED:   Shelly Coss, MD  [facility] 11/15/2019, 10:41 AM Pager [phone]  If 7PM-7AM, please contact night-coverage [email or web address] Password [email or web address]

## 2019-11-15 NOTE — TOC Progression Note (Signed)
Transition of Care [facility] - Progression Note    Patient Details  Name: Whitney Sloan MRN: [id number] Date of Birth: 14-Aug-1955  [facility] CM/SW Contact  Erin Sons, [place] Phone Number: 11/15/2019, 1:49 PM  Clinical Narrative:     CSW unable to arrange Home Health.Outpatient PT/OT internal referral made for [facility] location. Family informed that 3 n 1 is being shipped to house per Zack at [employer].   Expected Discharge Plan: Home w Home Health Services Barriers to Discharge: Continued Medical Work up  Expected Discharge Plan and Services Expected Discharge Plan: Home w Home Health Services In-house Referral: Clinical Social Work   Post Acute Care Choice: Home Health Living arrangements for the past 2 months: Apartment Expected Discharge Date: 11/15/19               DME Arranged: Cherre Huger rolling DME Agency: [facility] Date DME Agency Contacted: 11/12/19   Representative spoke with at DME Agency: Zack HH Arranged: PT, OT HH Agency: [facility] Date [facility] Agency Contacted: 11/11/19 Time HH Agency Contacted: [phone] Representative spoke with at [facility] Agency: Denyse Amass   Social Determinants of Health (SDOH) Interventions    Readmission Risk Interventions No flowsheet data found.

## 2019-11-15 NOTE — Progress Notes (Signed)
Progress Note  Patient Name: Whitney Sloan Date of Encounter: 11/15/2019  Primary Cardiologist: No primary care provider on file.   Subjective   Net negative 1.3L yesterday though also with unmeasured UOP (negative 10.2L on admission).  Creatinine stable at 0.8.  Reports dyspnea improved.    Inpatient Medications    Scheduled Meds: . amLODipine  10 mg Oral Daily  . aspirin EC  81 mg Oral Daily  . atorvastatin  20 mg Oral QHS  . bisacodyl  10 mg Rectal Once  . Chlorhexidine Gluconate Cloth  6 each Topical Daily  . enoxaparin (LOVENOX) injection  40 mg Subcutaneous Q24H  . furosemide  60 mg Intravenous BID  . insulin aspart  0-20 Units Subcutaneous TID AC & HS  . insulin aspart  16 Units Subcutaneous TID WC  . insulin glargine  50 Units Subcutaneous Daily  . lisinopril  40 mg Oral Daily  . Living Better with Heart Failure Book   Does not apply Once  . multivitamin with minerals  1 tablet Oral Daily  . pneumococcal 23 valent vaccine  0.5 mL Intramuscular Tomorrow-1000  . polycarbophil  625 mg Oral Daily  . polyethylene glycol  17 g Oral Daily  . sodium chloride flush  3 mL Intravenous Q12H  . tamsulosin  0.4 mg Oral Daily   Continuous Infusions: . sodium chloride     PRN Meds: sodium chloride, acetaminophen, albuterol, bisacodyl, hydrALAZINE, iohexol, ondansetron (ZOFRAN) IV, sodium chloride flush, zolpidem   Vital Signs    Vitals:   11/14/19 1619 11/14/19 2306 11/15/19 0508 11/15/19 0759  BP: 124/75 128/82  129/73  Pulse: 94 95  99  Resp: 18 18  19   Temp: 98.3 F (36.8 C) 98.2 F (36.8 C)  98.5 F (36.9 C)  TempSrc:  Oral    SpO2: 96% 97%  94%  Weight:   102.4 kg   Height:        Intake/Output Summary (Last 24 hours) at 11/15/2019 0915 Last data filed at 11/15/2019 0508 Gross per 24 hour  Intake --  Output 1000 ml  Net -1000 ml   Last 3 Weights 11/15/2019 11/14/2019 11/12/2019  Weight (lbs) 225 lb 12 oz 250 lb 254 lb 6.6 oz  Weight (kg) 102.4 kg 113.4 kg 115.4  kg      Telemetry    Sinus rhythm 90s-100s- Personally Reviewed  ECG    No new ECG- Personally Reviewed  Physical Exam   GEN: No acute distress.   Neck: No JVD appreciated but difficult toassess given body habitus Cardiac: RRR, no murmurs, rubs, or gallops.  Respiratory: Clear to auscultation bilaterally. GI: Soft, nontender, non-distended  MS: 1+ BLE edema Neuro:  Nonfocal  Psych: Normal affect   Labs    High Sensitivity Troponin:   Recent Labs  Lab 11/08/19 0115 11/08/19 0326  TROPONINIHS 19* 34*      Chemistry Recent Labs  Lab 11/13/19 0218 11/14/19 0239 11/15/19 0247  NA 139 137 141  K 3.4* 3.6 3.8  CL 99 101 101  CO2 29 28 28   GLUCOSE 130* 167* 141*  BUN 19 16 14   CREATININE 0.83 0.75 0.77  CALCIUM 8.8* 8.2* 8.9  GFRNONAA >60 >60 >60  GFRAA >60 >60 >60  ANIONGAP 11 8 12      Hematology Recent Labs  Lab 11/10/19 0509 11/12/19 0041 11/13/19 0218  WBC 13.2* 11.3* 11.9*  RBC 4.57 4.45 4.17  HGB 12.3 12.0 11.1*  HCT 39.7 37.8 35.6*  MCV 86.9  84.9 85.4  MCH 26.9 27.0 26.6  MCHC 31.0 31.7 31.2  RDW 14.8 14.5 14.5  PLT 297 255 271    BNP No results for input(s): BNP, PROBNP in the last 168 hours.   DDimer No results for input(s): DDIMER in the last 168 hours.   Radiology    No results found.  Cardiac Studies   TTE 11/08/19:  1. Left ventricular ejection fraction, by estimation, is 50 to 55%. The  left ventricle has low normal function. The left ventricle demonstrates  global hypokinesis. Left ventricular diastolic parameters are consistent  with Grade II diastolic dysfunction  (pseudonormalization). Elevated left ventricular end-diastolic pressure.  2. Right ventricular systolic function is normal. The right ventricular  size is normal.  3. The mitral valve is abnormal. Trivial mitral valve regurgitation.  4. The aortic valve is tricuspid. Aortic valve regurgitation is not  visualized. Mild aortic valve sclerosis is present,  with no evidence of  aortic valve stenosis.  5. The inferior vena cava is dilated in size with >50% respiratory  variability, suggesting right atrial pressure of 8 mmHg.   Patient Profile     64 y.o. 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  Assessment & Plan    Acute diastolic heart failure: presented with progressive SOB, LLE, orthopnea, PND for 1  Month.  Echo showed EF 50-55% with G2DD. - Continues to have LE edema but reports dyspnea has resolved, normal SPO2 on room air.  Will convert to PO lasix 40 mg daily  Acute respiratory failure with pulmonary edema: likely 2/2 acute diastolic heart failure.  Was on Bipap and nitro infusion in the ED.  Resolved with diuresis, now on room air.  Hypertensive urgency: nitro infusion in the ED. BP improved, now on amlodipine and lisinopril -Continue amlodipine 10 mg daily, lisinopril 40 mg daily  HLD: LDL 101.  Started atorvastatin 20 mg daily  [facility] will sign off.   Medication Recommendations:  Amlodipine 10 mg daily, atorvastatin 20 mg daily, lasix 40 mg daily, lisinopril 40 mg daily Other recommendations (labs, testing, etc):  BMET in 1 week.  Advised to monitor daily weights and call if gaining more than 3 lbs in a day or 5 lbs in a week. Follow up as an outpatient: Will schedule outpatient follow-up   For questions or updates, please contact [facility] Please consult [email or web address] for contact info under        Signed, Little Ishikawa, MD  11/15/2019, 9:15 AM

## 2019-11-17 LAB — CULTURE, BLOOD (ROUTINE X 2)
Culture: NO GROWTH
Culture: NO GROWTH

## 2019-11-20 NOTE — Progress Notes (Signed)
Name: Whitney Sloan  Age/ Sex: 64 y.o., female   MRN/ DOB: [id number], 1956-02-15     PCP: Emelia Loron, NP   Reason for Endocrinology Evaluation: Type 2 Diabetes Mellitus  Initial Endocrine Consultative Visit: 09/10/2018    PATIENT IDENTIFIER: [name] is a 64 y.o. female with a past medical history of HTN, T2DM and Dyslipidemia. The patient has followed with Endocrinology clinic since 09/10/2018 for consultative assistance with management of her diabetes.  DIABETIC HISTORY:  Whitney Sloan was diagnosed with T2DM in 2008, she has been on Metformin, Glipizide, Januvia and Actos in the past.She has been on insulin therapy for years. Her hemoglobin A1c has ranged from 8.0%in 2011, peaking at12.5%in 2010.  On her initial visit to our clinic her A1c was 11.0% , she was on lantus, Invokana, Metfomin and Pioglitazone but she was not taking them. Bydureon was added.   Invokana was stopped by the patient by 06/2019 due to recurrent yeast infections Prandial insulin added 07/2019  SUBJECTIVE:   During the last visit (08/07/2019): A1 c 11.7 % .  Patient was non adherent to her medication regimen, no changes were made she was encouraged to start with taking her medications    Today (11/21/2019): Whitney Sloan is here for a follow up on diabetes.She is accompanied by her daughter "Donalda Ewings"  today .  She has been checking glucose 2x a day . The patient has not had hypoglycemic episodes since the last clinic visit. Otherwise, the patient has required mergency interventions acute respiratory failure (10/2019)      HOME DIABETES REGIMEN:  Lantus 50 units daily  Novolog 16 units with each meal Metformin 1000 mg BID  Victoza 1.8 mg daily - not taking    METER DOWNLOAD SUMMARY: Did not bring   98- 140 mg/dL ( memory recall)  HISTORY:  Past Medical History:  Past Medical History:  Diagnosis Date  . Diabetes mellitus   . Hyperlipemia   . Hypertension   . Psoriasis    Past Surgical  History:  Past Surgical History:  Procedure Laterality Date  . ANAL FISSURE REPAIR    . ENDOMETRIAL ABLATION     Social History:  reports that she has never smoked. She has never used smokeless tobacco. She reports that she does not drink alcohol and does not use drugs. Family History:  Family History  Problem Relation Age of Onset  . Dementia Mother   . Renal Disease Father   . Diabetes Other      HOME MEDICATIONS: Allergies as of 11/21/2019      Reactions   Oxycodone-acetaminophen Nausea And Vomiting      Medication List       Accurate as of November 21, 2019 10:11 AM. If you have any questions, ask your nurse or doctor.        STOP taking these medications   insulin glargine 100 UNIT/ML injection Commonly known as: LANTUS Replaced by: Lantus SoloStar 100 UNIT/ML Solostar Pen Stopped by: Dorita Sciara, MD     TAKE these medications   amLODipine 10 MG tablet Commonly known as: NORVASC Take 1 tablet (10 mg total) by mouth daily.   Aspir-Low 81 MG EC tablet Generic drug: aspirin Take 81 mg by mouth every morning.   atorvastatin 20 MG tablet Commonly known as: LIPITOR Take 1 tablet (20 mg total) by mouth at bedtime.   ezetimibe 10 MG tablet Commonly known as: ZETIA Take 10 mg by mouth at bedtime.  furosemide 40 MG tablet Commonly known as: LASIX Take 1 tablet (40 mg total) by mouth daily.   ibuprofen 200 MG tablet Commonly known as: ADVIL Take 200 mg by mouth every 6 (six) hours as needed for fever, headache or moderate pain.   Lantus SoloStar 100 UNIT/ML Solostar Pen Generic drug: insulin glargine Inject 40 Units into the skin daily. Replaces: insulin glargine 100 UNIT/ML injection Started by: Scarlette Shorts, MD   lisinopril 40 MG tablet Commonly known as: ZESTRIL Take 1 tablet (40 mg total) by mouth daily.   metFORMIN 500 MG tablet Commonly known as: GLUCOPHAGE Take 1,000 mg by mouth 2 (two) times daily.   NovoLOG FlexPen 100 UNIT/ML  FlexPen Generic drug: insulin aspart Inject 16 Units into the skin 3 (three) times daily with meals.   tamsulosin 0.4 MG Caps capsule Commonly known as: FLOMAX Take 1 capsule (0.4 mg total) by mouth daily.   Unifine Pentips 31G X 6 MM Misc Generic drug: Insulin Pen Needle USE TO INJECT INSULIN   VITAMIN D PO Take 1 capsule by mouth daily.        OBJECTIVE:   Vital Signs: BP 132/88 (BP Location: Left Arm, Patient Position: Sitting, Cuff Size: Normal)   Pulse 96   Ht 5\' 5"  (1.651 m)   Wt 249 lb 9.6 oz (113.2 kg)   SpO2 96%   BMI 41.54 kg/m   Wt Readings from Last 3 Encounters:  11/21/19 249 lb 9.6 oz (113.2 kg)  11/15/19 225 lb 12 oz (102.4 kg)  08/07/19 237 lb 9.6 oz (107.8 kg)     Exam: General: Pt appears well and is in NAD  Lungs: CTA   Heart: RRR  Extremities: 2+  pretibial edema.  Neuro: MS is good with appropriate affect, pt is alert and Ox3    DM Foot Exam 11/21/2019 The skin of the feet is without sores or ulcerations. The pedal pulses undetectable due to edema  The sensation is decreased to a screening 5.07, 10 gram monofilament bilaterally       DATA REVIEWED:  Lab Results  Component Value Date   HGBA1C 10.1 (H) 11/08/2019   HGBA1C 11.7 (A) 08/07/2019   HGBA1C 12.4 (A) 05/06/2019         In-Office BG 56 mg/dL   ASSESSMENT / PLAN / RECOMMENDATIONS:   1) Type2Diabetes Mellitus,Poorlycontrolled, Withneuropathic complications - Most recent A1c of10.1%. Goal A1c <7.0%.   - A1c down from 11.7 % - Since her hospitalization, she has been motivated to take control of diabetes. She has been more consistent with insulin intake.  - She has been noted with hypoglycemia in the office today BG 56 mg/dL , we followed rule of 15 for hypoglycemia with repeat BGs of 65 and 73 mg/dL   - Will adjust insulin as below - She is reluctant to restart victoza as this coincided with her CHF attack. We did discussed side effects of victoza but will stop it  at this time     MEDICATIONS: - Decrease  Lantus 40 units daily   - Continue Metformin 1000 mg twice a day  - NovoLog 16 units with each meal  EDUCATION / INSTRUCTIONS: BG monitoring instructions: Patient is instructed to check her blood sugars 3 times a day, before meals Call [facility] if: BG persistently < 70 or > 300. I reviewed the Rule of 15 for the treatment of hypoglycemia in detail with the patient. Literature supplied.   F/U in 4 months  Signed electronically by: Lyndle Herrlich, MD  [facility]  [facility] [address] [place], [place] [address] Phone: [phone] FAX: [phone]   CC: Carmel Sacramento, NP [address] [place] [address] Phone: [phone]  Fax: [phone]  Return to Endocrinology clinic as below: No future appointments.

## 2019-11-21 ENCOUNTER — Ambulatory Visit (INDEPENDENT_AMBULATORY_CARE_PROVIDER_SITE_OTHER): Payer: Medicaid Other | Admitting: Internal Medicine

## 2019-11-21 ENCOUNTER — Encounter: Payer: Self-pay | Admitting: Internal Medicine

## 2019-11-21 ENCOUNTER — Other Ambulatory Visit: Payer: Self-pay

## 2019-11-21 VITALS — BP 132/88 | HR 96 | Ht 65.0 in | Wt 249.6 lb

## 2019-11-21 DIAGNOSIS — Z794 Long term (current) use of insulin: Secondary | ICD-10-CM

## 2019-11-21 DIAGNOSIS — E1165 Type 2 diabetes mellitus with hyperglycemia: Secondary | ICD-10-CM | POA: Diagnosis not present

## 2019-11-21 DIAGNOSIS — E1159 Type 2 diabetes mellitus with other circulatory complications: Secondary | ICD-10-CM | POA: Diagnosis not present

## 2019-11-21 DIAGNOSIS — E1142 Type 2 diabetes mellitus with diabetic polyneuropathy: Secondary | ICD-10-CM

## 2019-11-21 LAB — GLUCOSE, POCT (MANUAL RESULT ENTRY)
POC Glucose: 56 mg/dl — AB (ref 70–99)
POC Glucose: 65 mg/dl — AB (ref 70–99)
POC Glucose: 73 mg/dl (ref 70–99)

## 2019-11-21 MED ORDER — LANTUS SOLOSTAR 100 UNIT/ML ~~LOC~~ SOPN
40.0000 [IU] | PEN_INJECTOR | Freq: Every day | SUBCUTANEOUS | 3 refills | Status: DC
Start: 2019-11-21 — End: 2020-03-26

## 2019-11-21 NOTE — Patient Instructions (Signed)
-   Decrease Lantus 40 units daily  - Continue Metformin 1000 mg twice a day  - Continue Novolog 16 units with each meal     HOW TO TREAT LOW BLOOD SUGARS (Blood sugar LESS THAN 70 MG/DL)  Please follow the RULE OF 15 for the treatment of hypoglycemia treatment (when your (blood sugars are less than 70 mg/dL)    STEP 1: Take 15 grams of carbohydrates when your blood sugar is low, which includes:   3-4 GLUCOSE TABS  OR  3-4 OZ OF JUICE OR REGULAR SODA OR  ONE TUBE OF GLUCOSE GEL     STEP 2: RECHECK blood sugar in 15 MINUTES STEP 3: If your blood sugar is still low at the 15 minute recheck --> then, go back to STEP 1 and treat AGAIN with another 15 grams of carbohydrates.

## 2019-11-30 ENCOUNTER — Ambulatory Visit (HOSPITAL_COMMUNITY)
Admission: EM | Admit: 2019-11-30 | Discharge: 2019-11-30 | Disposition: A | Discharge status: Home / Self Care | Payer: Medicaid Other | Attending: Family Medicine | Admitting: Family Medicine

## 2019-11-30 ENCOUNTER — Other Ambulatory Visit: Payer: Self-pay

## 2019-11-30 ENCOUNTER — Encounter (HOSPITAL_COMMUNITY): Payer: Self-pay

## 2019-11-30 DIAGNOSIS — K5900 Constipation, unspecified: Secondary | ICD-10-CM

## 2019-11-30 NOTE — Discharge Instructions (Signed)
Rx Miralax for constipation - start with 1/2 cap daily and may increase to 1 cap daily to achieve 1-2 soft stools per day.  Continue MIralax for ~6 months to allow the rectum to return to normal caliber.   Increase fruits, veggies, and water in diet.   Supportive cares, return precautions, and emergency procedures reviewed.  Please try a laxative  Please be seen in the emergency department if you fail to have a movement.

## 2019-11-30 NOTE — ED Provider Notes (Signed)
[facility]    CSN: [id number] Arrival date & time: 11/30/19  1624      History   Chief Complaint Chief Complaint  Patient presents with  . Abdominal Pain    HPI Whitney Sloan is a 64 y.o. female.   She is presenting with left lower quadrant abdominal pain.  She was recently discharged from the hospital with fluid overload.  Her last bowel movement was on Wednesday.  She has been having this left lower quadrant pain since that time.  She has a history of constipation.  She did have a take a laxative while she was admitted in the hospital.  HPI  Past Medical History:  Diagnosis Date  . Diabetes mellitus   . Hyperlipemia   . Hypertension   . Psoriasis     Patient Active Problem List   Diagnosis Date Noted  . Acute cardiogenic pulmonary edema (HCC) 11/08/2019  . Acute respiratory failure with hypoxia (HCC) 11/08/2019  . Acute CHF (congestive heart failure) (HCC) 11/08/2019  . Hypertensive crisis 11/08/2019  . Mixed diabetic hyperlipidemia associated with type 2 diabetes mellitus (HCC) 11/08/2019  . Type 2 diabetes mellitus with diabetic polyneuropathy, with long-term current use of insulin (HCC) 08/08/2019  . Diabetes mellitus (HCC) 08/08/2019  . HYPERLIPIDEMIA 03/23/2010  . DECREASED HEARING, BILATERAL 02/05/2010  . MEMORY LOSS 02/05/2010  . INCONTINENCE 02/05/2010  . NEOPLASM, MALIGNANT, BREAST, HX OF 01/13/2010  . ANXIETY STATE, UNSPECIFIED 12/02/2009  . UNSTEADY GAIT 12/02/2009  . TOE PAIN 09/24/2009  . ANAL OR RECTAL PAIN 08/25/2009  . LEG EDEMA, BILATERAL 08/25/2009  . Uncontrolled type 2 diabetes mellitus with hyperglycemia, with long-term current use of insulin (HCC) 08/06/2009  . Essential hypertension 08/06/2009  . DERMATITIS, ATOPIC 08/06/2009    Past Surgical History:  Procedure Laterality Date  . ANAL FISSURE REPAIR    . ENDOMETRIAL ABLATION      OB History   No obstetric history on file.      Home Medications    Prior to  Admission medications   Medication Sig Start Date End Date Taking? Authorizing Provider  amLODipine (NORVASC) 10 MG tablet Take 1 tablet (10 mg total) by mouth daily. 11/16/19   Burnadette Pop, MD  aspirin (ASPIR-LOW) 81 MG EC tablet Take 81 mg by mouth every morning.     [provider]  atorvastatin (LIPITOR) 20 MG tablet Take 1 tablet (20 mg total) by mouth at bedtime. 11/15/19   Burnadette Pop, MD  Cholecalciferol (VITAMIN D PO) Take 1 capsule by mouth daily.    [provider]  ezetimibe (ZETIA) 10 MG tablet Take 10 mg by mouth at bedtime. 08/21/19   [provider]  furosemide (LASIX) 40 MG tablet Take 1 tablet (40 mg total) by mouth daily. 11/15/19   Burnadette Pop, MD  ibuprofen (ADVIL) 200 MG tablet Take 200 mg by mouth every 6 (six) hours as needed for fever, headache or moderate pain.    [provider]  insulin aspart (NOVOLOG FLEXPEN) 100 UNIT/ML FlexPen Inject 16 Units into the skin 3 (three) times daily with meals. 08/07/19   Shamleffer, Konrad Dolores, MD  insulin glargine (LANTUS SOLOSTAR) 100 UNIT/ML Solostar Pen Inject 40 Units into the skin daily. 11/21/19   Shamleffer, Konrad Dolores, MD  lisinopril (ZESTRIL) 40 MG tablet Take 1 tablet (40 mg total) by mouth daily. 11/15/19   Burnadette Pop, MD  metFORMIN (GLUCOPHAGE) 500 MG tablet Take 1,000 mg by mouth 2 (two) times daily.  10/21/19  [provider]  tamsulosin (FLOMAX) 0.4 MG CAPS capsule Take 1 capsule (0.4 mg total) by mouth daily. 11/16/19   Burnadette Pop, MD  UNIFINE PENTIPS 31G X 6 MM MISC USE TO INJECT INSULIN 03/21/11   Edd Arbour, MD    Family History Family History  Problem Relation Age of Onset  . Dementia Mother   . Renal Disease Father   . Diabetes Other     Social History Social History   Tobacco Use  . Smoking status: Never Smoker  . Smokeless tobacco: Never Used  Substance Use Topics  . Alcohol use: No  . Drug use: No     Allergies     Oxycodone-acetaminophen   Review of Systems Review of Systems  See HPI  Physical Exam Triage Vital Signs ED Triage Vitals [11/30/19 1657]  Enc Vitals Group     BP (!) 151/72     Pulse Rate 95     Resp 16     Temp 98.4 F (36.9 C)     Temp Source Oral     SpO2 99 %     Weight      Height      Head Circumference      Peak Flow      Pain Score 5     Pain Loc      Pain Edu?      Excl. in GC?    No data found.  Updated Vital Signs BP (!) 151/72 (BP Location: Right Arm)   Pulse 95   Temp 98.4 F (36.9 C) (Oral)   Resp 16   SpO2 99%   Visual Acuity Right Eye Distance:   Left Eye Distance:   Bilateral Distance:    Right Eye Near:   Left Eye Near:    Bilateral Near:     Physical Exam Gen: NAD, alert, cooperative with exam, well-appearing ENT: normal lips, normal nasal mucosa,  Eye: normal EOM, normal conjunctiva and lids Resp: no accessory muscle use, non-labored,  GI: no masses or tenderness, soft, nontender, nondistended Skin: no rashes, no areas of induration    UC Treatments / Results  Labs (all labs ordered are listed, but only abnormal results are displayed) Labs Reviewed - No data to display  EKG   Radiology No results found.  Procedures Procedures (including critical care time)  Medications Ordered in UC Medications - No data to display  Initial Impression / Assessment and Plan / UC Course  I have reviewed the triage vital signs and the nursing notes.  Pertinent labs & imaging results that were available during my care of the patient were reviewed by me and considered in my medical decision making (see chart for details).     [name] is a 64 year old female that is presenting with left lower quadrant abdominal pain.  Symptoms are likely secondary to constipation.  She has been constipated before.  She had to take a laxative while admitted.  Counseled on MiraLAX and home therapies.  Advised to be seen in the emergency department if  fails have a bowel movement.  Final Clinical Impressions(s) / UC Diagnoses   Final diagnoses:  Constipation, unspecified constipation type     Discharge Instructions     Rx Miralax for constipation - start with 1/2 cap daily and may increase to 1 cap daily to achieve 1-2 soft stools per day.  Continue MIralax for ~6 months to allow the rectum to return to normal caliber.   Increase fruits, veggies, and  water in diet.   Supportive cares, return precautions, and emergency procedures reviewed.  Please try a laxative  Please be seen in the emergency department if you fail to have a movement.     ED Prescriptions    None     PDMP not reviewed this encounter.   Rosemarie Ax, MD 11/30/19 1807

## 2019-11-30 NOTE — ED Triage Notes (Signed)
Pt present abdominal pain, in the lower level of her pelvic area.  Symptoms started on Monday.  Pt also has had some  Constipations as well.

## 2019-12-02 ENCOUNTER — Other Ambulatory Visit: Payer: Self-pay

## 2019-12-02 ENCOUNTER — Encounter: Payer: Self-pay | Admitting: Cardiology

## 2019-12-02 ENCOUNTER — Ambulatory Visit (INDEPENDENT_AMBULATORY_CARE_PROVIDER_SITE_OTHER): Payer: Medicaid Other | Admitting: Cardiology

## 2019-12-02 VITALS — BP 135/80 | HR 99 | Temp 97.2°F | Ht 65.0 in | Wt 240.2 lb

## 2019-12-02 DIAGNOSIS — I10 Essential (primary) hypertension: Secondary | ICD-10-CM

## 2019-12-02 DIAGNOSIS — R079 Chest pain, unspecified: Secondary | ICD-10-CM | POA: Diagnosis not present

## 2019-12-02 DIAGNOSIS — Z79899 Other long term (current) drug therapy: Secondary | ICD-10-CM

## 2019-12-02 DIAGNOSIS — I5031 Acute diastolic (congestive) heart failure: Secondary | ICD-10-CM

## 2019-12-02 DIAGNOSIS — E785 Hyperlipidemia, unspecified: Secondary | ICD-10-CM

## 2019-12-02 MED ORDER — FUROSEMIDE 80 MG PO TABS: 80.0000 mg | ORAL_TABLET | Freq: Two times a day (BID) | ORAL | 3 refills | Status: DC

## 2019-12-02 NOTE — Patient Instructions (Addendum)
Medication Instructions:  INCREASE furosemide (Lasix) to 80 mg two times daily  *If you need a refill on your cardiac medications before your next appointment, please call your pharmacy*   Lab Work: BMET, Mag, BNP today  If you have labs (blood work) drawn today and your tests are completely normal, you will receive your results only by: Marland Kitchen MyChart Message (if you have MyChart) OR . A paper copy in the mail If you have any lab test that is abnormal or we need to change your treatment, we will call you to review the results.   Testing/Procedures: Your physician has requested that you have a lexiscan myoview, 2 day at [facility] (in 2-3 weeks). For further information please visit [email or web address] Please follow instruction sheet, as given   Follow-Up: At [facility], you and your health needs are our priority.  As part of our continuing mission to provide you with exceptional heart care, we have created designated Provider Care Teams.  These Care Teams include your primary Cardiologist (physician) and Advanced Practice Providers (APPs -  Physician Assistants and Nurse Practitioners) who all work together to provide you with the care you need, when you need it.  We recommend signing up for the patient portal called "MyChart".  Sign up information is provided on this After Visit Summary.  MyChart is used to connect with patients for Virtual Visits (Telemedicine).  Patients are able to view lab/test results, encounter notes, upcoming appointments, etc.  Non-urgent messages can be sent to your provider as well.   To learn more about what you can do with MyChart, go to [email or web address].    Your next appointment:   NEXT WEEK in person with APP 1 month with Dr. Bjorn Pippin

## 2019-12-02 NOTE — Progress Notes (Signed)
Cardiology Office Note:    Date:  12/04/2019   ID:  DECLYNN LOPRESTI, DOB 03/07/1956, MRN [id number]  PCP:  Emelia Loron, NP  Cardiologist:  No primary care provider on file.  Electrophysiologist:  None   Referring MD: Emelia Loron, NP   Chief Complaint  Patient presents with  . Congestive Heart Failure    History of Present Illness:    Whitney Sloan is a 64 y.o. female with a hx of chronic diastolic heart failure, K0XF, hypertension, hyperlipidemia, obesity, CVA in 2011  who presents as a hospital follow-up.  She had recent admission to [facility] for diastolic heart failure exacerbation.  Presented to the ED on 11/08/2019.  She was in respiratory distress and started on BiPAP and nitroglycerin drip.  TTE shows LVEF 50 to 81%, grade 2 diastolic function, normal RV function, no significant valvular disease.  She was diuresed with IV Lasix, was net negative over 10 L during admission.  She was discharged on Lasix 40 mg daily, lisinopril 40 mg daily, amlodipine 10 mg daily.  Has gained weight since left hospital, 10-12 lbs. reports dyspnea with minimal exertion.  BP has been controlled.  Reports she has been having chest pain 2-3 times per week.  Describes a squeezing pain in her left upper chest.  Lasts few minutes and resolves.  No clear relationship with exertion.   Wt Readings from Last 3 Encounters:  12/02/19 240 lb 3.2 oz (109 kg)  11/21/19 249 lb 9.6 oz (113.2 kg)  11/15/19 225 lb 12 oz (102.4 kg)     Past Medical History:  Diagnosis Date  . Diabetes mellitus   . Hyperlipemia   . Hypertension   . Psoriasis     Past Surgical History:  Procedure Laterality Date  . ANAL FISSURE REPAIR    . ENDOMETRIAL ABLATION      Current Medications: Current Meds  Medication Sig  . amLODipine (NORVASC) 10 MG tablet Take 1 tablet (10 mg total) by mouth daily.  Marland Kitchen aspirin (ASPIR-LOW) 81 MG EC tablet Take 81 mg by mouth every morning.   Marland Kitchen atorvastatin (LIPITOR) 20 MG tablet Take 1 tablet (20 mg  total) by mouth at bedtime.  . Cholecalciferol (VITAMIN D PO) Take 1 capsule by mouth daily.  Marland Kitchen ezetimibe (ZETIA) 10 MG tablet Take 10 mg by mouth at bedtime.  . furosemide (LASIX) 80 MG tablet Take 1 tablet (80 mg total) by mouth 2 (two) times daily.  Marland Kitchen ibuprofen (ADVIL) 200 MG tablet Take 200 mg by mouth every 6 (six) hours as needed for fever, headache or moderate pain.  Marland Kitchen insulin aspart (NOVOLOG FLEXPEN) 100 UNIT/ML FlexPen Inject 16 Units into the skin 3 (three) times daily with meals.  . insulin glargine (LANTUS SOLOSTAR) 100 UNIT/ML Solostar Pen Inject 40 Units into the skin daily.  Marland Kitchen lisinopril (ZESTRIL) 40 MG tablet Take 1 tablet (40 mg total) by mouth daily.  . metFORMIN (GLUCOPHAGE) 500 MG tablet Take 1,000 mg by mouth 2 (two) times daily.   . tamsulosin (FLOMAX) 0.4 MG CAPS capsule Take 1 capsule (0.4 mg total) by mouth daily.  Marland Kitchen UNIFINE PENTIPS 31G X 6 MM MISC USE TO INJECT INSULIN  . [DISCONTINUED] furosemide (LASIX) 40 MG tablet Take 1 tablet (40 mg total) by mouth daily.     Allergies:   Oxycodone-acetaminophen   Social History   Socioeconomic History  . Marital status: Divorced    Spouse name: Not on file  . Number of children: Not on file  .  Years of education: Not on file  . Highest education level: Not on file  Occupational History  . Not on file  Tobacco Use  . Smoking status: Never Smoker  . Smokeless tobacco: Never Used  Substance and Sexual Activity  . Alcohol use: No  . Drug use: No  . Sexual activity: Not on file  Other Topics Concern  . Not on file  Social History Narrative  . Not on file   Social Determinants of Health   Financial Resource Strain:   . Difficulty of Paying Living Expenses:   Food Insecurity:   . Worried About Programme researcher, broadcasting/film/video in the Last Year:   . Barista in the Last Year:   Transportation Needs:   . Freight forwarder (Medical):   Marland Kitchen Lack of Transportation (Non-Medical):   Physical Activity:   . Days of  Exercise per Week:   . Minutes of Exercise per Session:   Stress:   . Feeling of Stress :   Social Connections:   . Frequency of Communication with Friends and Family:   . Frequency of Social Gatherings with Friends and Family:   . Attends Religious Services:   . Active Member of Clubs or Organizations:   . Attends Banker Meetings:   Marland Kitchen Marital Status:      Family History: The patient's family history includes Dementia in her mother; Diabetes in an other family member; Renal Disease in her father.  ROS:   Please see the history of present illness.     All other systems reviewed and are negative.  EKGs/Labs/Other Studies Reviewed:    The following studies were reviewed today:   EKG:  EKG is ordered today.  The ekg ordered today demonstrates normal sinus rhythm, rate T wave inversions in leads I, aVL  Recent Labs: 11/08/2019: ALT 24 11/13/2019: Hemoglobin 11.1; Platelets 271 12/02/2019: BNP 93.7; BUN 10; Creatinine, Ser 0.72; Magnesium 1.9; Potassium 4.2; Sodium 145  Recent Lipid Panel    Component Value Date/Time   CHOL 168 11/13/2019 0218   TRIG 97 11/13/2019 0218   HDL 48 11/13/2019 0218   CHOLHDL 3.5 11/13/2019 0218   VLDL 19 11/13/2019 0218   LDLCALC 101 (H) 11/13/2019 0218    Physical Exam:    VS:  BP 135/80   Pulse 99   Temp (!) 97.2 F (36.2 C)   Ht 5\' 5"  (1.651 m)   Wt 240 lb 3.2 oz (109 kg)   SpO2 96%   BMI 39.97 kg/m     Wt Readings from Last 3 Encounters:  12/02/19 240 lb 3.2 oz (109 kg)  11/21/19 249 lb 9.6 oz (113.2 kg)  11/15/19 225 lb 12 oz (102.4 kg)     GEN: in no acute distress HEENT: Normal NECK: No JVD appreciated but difficult to assess given body CARDIAC: RRR, no murmurs, rubs, gallops RESPIRATORY:  Clear to auscultation without rales, wheezing or rhonchi  ABDOMEN: Soft, non-tender, non-distended MUSCULOSKELETAL:  BLE edema SKIN: Warm and dry NEUROLOGIC:  Alert and oriented x 3 PSYCHIATRIC:  Normal affect    ASSESSMENT:    1. Acute diastolic heart failure (HCC)   2. Hypertension, unspecified type   3. Medication management   4. Chest pain of uncertain etiology   5. Hyperlipidemia, unspecified hyperlipidemia type   6. Essential hypertension    PLAN:     Acute on chronic diastolic heart failure: Recent admission with decompensated heart failure.  TTE showed EF 50-55% with G2DD.  Was diuresed over 10 L during admission.  Discharged on Lasix 40 mg daily.  Reports 10 pound weight gain since discharge.  Appears volume overloaded on exam. -Increase Lasix to 80 mg twice daily.  Will check BMP, magnesium, BNP  Chest pain: Atypical in description, but does have significant CAD risk factors (diabetes, hypertension, hyperlipidemia).  Plan for Lexiscan Myoview once more euvolemic  Hypertension: Continue lisinopril 40 mg daily and amlodipine 10 mg daily.  Appears controlled  HLD: LDL 101 on 11/13/2018.  Started atorvastatin 20 mg daily  Type 2 diabetes: On insulin.  A1c 10.1  Medication Adjustments/Labs and Tests Ordered: Current medicines are reviewed at length with the patient today.  Concerns regarding medicines are outlined above.  Orders Placed This Encounter  Procedures  . Basic metabolic panel  . Brain natriuretic peptide  . Magnesium  . MYOCARDIAL PERFUSION IMAGING  . EKG 12-Lead   Meds ordered this encounter  Medications  . furosemide (LASIX) 80 MG tablet    Sig: Take 1 tablet (80 mg total) by mouth 2 (two) times daily.    Dispense:  30 tablet    Refill:  3    Patient Instructions  Medication Instructions:  INCREASE furosemide (Lasix) to 80 mg two times daily  *If you need a refill on your cardiac medications before your next appointment, please call your pharmacy*   Lab Work: BMET, Mag, BNP today  If you have labs (blood work) drawn today and your tests are completely normal, you will receive your results only by: Marland Kitchen MyChart Message (if you have MyChart) OR . A paper  copy in the mail If you have any lab test that is abnormal or we need to change your treatment, we will call you to review the results.   Testing/Procedures: Your physician has requested that you have a lexiscan myoview, 2 day at [facility] (in 2-3 weeks). For further information please visit [email or web address] Please follow instruction sheet, as given   Follow-Up: At [facility], you and your health needs are our priority.  As part of our continuing mission to provide you with exceptional heart care, we have created designated Provider Care Teams.  These Care Teams include your primary Cardiologist (physician) and Advanced Practice Providers (APPs -  Physician Assistants and Nurse Practitioners) who all work together to provide you with the care you need, when you need it.  We recommend signing up for the patient portal called "MyChart".  Sign up information is provided on this After Visit Summary.  MyChart is used to connect with patients for Virtual Visits (Telemedicine).  Patients are able to view lab/test results, encounter notes, upcoming appointments, etc.  Non-urgent messages can be sent to your provider as well.   To learn more about what you can do with MyChart, go to [email or web address].    Your next appointment:   NEXT WEEK in person with APP 1 month with Dr. Bjorn Pippin      Signed, Little Ishikawa, MD  12/04/2019 5:53 PM    [facility]

## 2019-12-03 LAB — BASIC METABOLIC PANEL
BUN/Creatinine Ratio: 14 (ref 12–28)
BUN: 10 mg/dL (ref 8–27)
CO2: 24 mmol/L (ref 20–29)
Calcium: 9.4 mg/dL (ref 8.7–10.3)
Chloride: 101 mmol/L (ref 96–106)
Creatinine, Ser: 0.72 mg/dL (ref 0.57–1.00)
GFR calc Af Amer: 102 mL/min/{1.73_m2} (ref >59)
GFR calc non Af Amer: 89 mL/min/{1.73_m2} (ref >59)
Glucose: 194 mg/dL — ABNORMAL HIGH (ref 65–99)
Potassium: 4.2 mmol/L (ref 3.5–5.2)
Sodium: 145 mmol/L — ABNORMAL HIGH (ref 134–144)

## 2019-12-03 LAB — BRAIN NATRIURETIC PEPTIDE: BNP: 93.7 pg/mL (ref 0.0–100.0)

## 2019-12-03 LAB — MAGNESIUM: Magnesium: 1.9 mg/dL (ref 1.6–2.3)

## 2019-12-04 ENCOUNTER — Ambulatory Visit: Payer: Medicaid Other | Admitting: Internal Medicine

## 2019-12-05 ENCOUNTER — Telehealth: Payer: Self-pay | Admitting: Cardiology

## 2019-12-05 ENCOUNTER — Telehealth (HOSPITAL_COMMUNITY): Payer: Self-pay | Admitting: *Deleted

## 2019-12-05 ENCOUNTER — Other Ambulatory Visit: Payer: Self-pay | Admitting: *Deleted

## 2019-12-05 DIAGNOSIS — Z79899 Other long term (current) drug therapy: Secondary | ICD-10-CM

## 2019-12-05 DIAGNOSIS — I5031 Acute diastolic (congestive) heart failure: Secondary | ICD-10-CM

## 2019-12-05 MED ORDER — FUROSEMIDE 80 MG PO TABS: 80.0000 mg | ORAL_TABLET | Freq: Every day | ORAL | 3 refills | Status: DC

## 2019-12-05 NOTE — Telephone Encounter (Signed)
Spoke to patient, she states she forgot her new rx was 80 mg tablets instead of 40, so she was actually taking 160mg  BID.    She states the 2nd dose she became nauseous but has been ok since.    Advised to decrease as recommended by Dr. to 80 daily and monitor weights.   Spoke to Dr. Bjorn Pippin BMET.   Patient aware and will try to get this done tomorrow. Order placed

## 2019-12-05 NOTE — Telephone Encounter (Signed)
Pt c/o medication issue:  1. Name of Medication: furosemide (LASIX) 80 MG tablet  2. How are you currently taking this medication (dosage and times per day)? As directed, Starting 1x daily tomorrow  3. Are you having a reaction (difficulty breathing--STAT)? no  4. What is your medication issue? Patient states that she was taking this medication 4x daily. 2 in the morning and 2 and night. She wants to know if she was taking too much? She states she did not notice an increase in urinating more frequently.

## 2019-12-05 NOTE — Telephone Encounter (Signed)
Left message on voicemail in reference to upcoming appointment scheduled for 12/11/19. Phone number given for a call back so details instructions can be given. [place], Ruel Dimmick Jacqueline ° ° °

## 2019-12-06 ENCOUNTER — Other Ambulatory Visit: Payer: Self-pay

## 2019-12-06 DIAGNOSIS — Z79899 Other long term (current) drug therapy: Secondary | ICD-10-CM | POA: Diagnosis not present

## 2019-12-06 DIAGNOSIS — I5031 Acute diastolic (congestive) heart failure: Secondary | ICD-10-CM | POA: Diagnosis not present

## 2019-12-07 LAB — BASIC METABOLIC PANEL
BUN/Creatinine Ratio: 24 (ref 12–28)
BUN: 17 mg/dL (ref 8–27)
CO2: 27 mmol/L (ref 20–29)
Calcium: 9.6 mg/dL (ref 8.7–10.3)
Chloride: 98 mmol/L (ref 96–106)
Creatinine, Ser: 0.72 mg/dL (ref 0.57–1.00)
GFR calc Af Amer: 102 mL/min/{1.73_m2} (ref >59)
GFR calc non Af Amer: 89 mL/min/{1.73_m2} (ref >59)
Glucose: 195 mg/dL — ABNORMAL HIGH (ref 65–99)
Potassium: 3.9 mmol/L (ref 3.5–5.2)
Sodium: 140 mmol/L (ref 134–144)

## 2019-12-09 ENCOUNTER — Inpatient Hospital Stay (HOSPITAL_COMMUNITY)
Admission: EM | Admit: 2019-12-09 | Discharge: 2019-12-13 | DRG: 871 | Disposition: A | Discharge status: Home / Self Care | Payer: Medicaid Other | Attending: Internal Medicine | Admitting: Internal Medicine

## 2019-12-09 ENCOUNTER — Encounter (HOSPITAL_COMMUNITY): Payer: Self-pay | Admitting: *Deleted

## 2019-12-09 DIAGNOSIS — J9601 Acute respiratory failure with hypoxia: Secondary | ICD-10-CM | POA: Diagnosis present

## 2019-12-09 DIAGNOSIS — N12 Tubulo-interstitial nephritis, not specified as acute or chronic: Secondary | ICD-10-CM | POA: Diagnosis not present

## 2019-12-09 DIAGNOSIS — I69393 Ataxia following cerebral infarction: Secondary | ICD-10-CM

## 2019-12-09 DIAGNOSIS — Z7982 Long term (current) use of aspirin: Secondary | ICD-10-CM

## 2019-12-09 DIAGNOSIS — R0902 Hypoxemia: Secondary | ICD-10-CM

## 2019-12-09 DIAGNOSIS — K8070 Calculus of gallbladder and bile duct without cholecystitis without obstruction: Secondary | ICD-10-CM | POA: Diagnosis present

## 2019-12-09 DIAGNOSIS — I10 Essential (primary) hypertension: Secondary | ICD-10-CM | POA: Diagnosis present

## 2019-12-09 DIAGNOSIS — R000 Tachycardia, unspecified: Secondary | ICD-10-CM | POA: Diagnosis not present

## 2019-12-09 DIAGNOSIS — I152 Hypertension secondary to endocrine disorders: Secondary | ICD-10-CM | POA: Diagnosis present

## 2019-12-09 DIAGNOSIS — E1165 Type 2 diabetes mellitus with hyperglycemia: Secondary | ICD-10-CM

## 2019-12-09 DIAGNOSIS — Z885 Allergy status to narcotic agent status: Secondary | ICD-10-CM

## 2019-12-09 DIAGNOSIS — E041 Nontoxic single thyroid nodule: Secondary | ICD-10-CM | POA: Diagnosis present

## 2019-12-09 DIAGNOSIS — E119 Type 2 diabetes mellitus without complications: Secondary | ICD-10-CM

## 2019-12-09 DIAGNOSIS — E669 Obesity, unspecified: Secondary | ICD-10-CM | POA: Diagnosis present

## 2019-12-09 DIAGNOSIS — Z841 Family history of disorders of kidney and ureter: Secondary | ICD-10-CM

## 2019-12-09 DIAGNOSIS — I110 Hypertensive heart disease with heart failure: Secondary | ICD-10-CM | POA: Diagnosis present

## 2019-12-09 DIAGNOSIS — E876 Hypokalemia: Secondary | ICD-10-CM | POA: Diagnosis present

## 2019-12-09 DIAGNOSIS — I5032 Chronic diastolic (congestive) heart failure: Secondary | ICD-10-CM | POA: Diagnosis present

## 2019-12-09 DIAGNOSIS — K5900 Constipation, unspecified: Secondary | ICD-10-CM | POA: Diagnosis present

## 2019-12-09 DIAGNOSIS — I2510 Atherosclerotic heart disease of native coronary artery without angina pectoris: Secondary | ICD-10-CM | POA: Diagnosis present

## 2019-12-09 DIAGNOSIS — Z79899 Other long term (current) drug therapy: Secondary | ICD-10-CM

## 2019-12-09 DIAGNOSIS — G4733 Obstructive sleep apnea (adult) (pediatric): Secondary | ICD-10-CM | POA: Diagnosis present

## 2019-12-09 DIAGNOSIS — Z794 Long term (current) use of insulin: Secondary | ICD-10-CM

## 2019-12-09 DIAGNOSIS — K8020 Calculus of gallbladder without cholecystitis without obstruction: Secondary | ICD-10-CM | POA: Diagnosis present

## 2019-12-09 DIAGNOSIS — A419 Sepsis, unspecified organism: Secondary | ICD-10-CM | POA: Diagnosis not present

## 2019-12-09 DIAGNOSIS — Z833 Family history of diabetes mellitus: Secondary | ICD-10-CM

## 2019-12-09 DIAGNOSIS — Z791 Long term (current) use of non-steroidal anti-inflammatories (NSAID): Secondary | ICD-10-CM

## 2019-12-09 DIAGNOSIS — Z20822 Contact with and (suspected) exposure to covid-19: Secondary | ICD-10-CM | POA: Diagnosis present

## 2019-12-09 DIAGNOSIS — E785 Hyperlipidemia, unspecified: Secondary | ICD-10-CM | POA: Diagnosis present

## 2019-12-09 DIAGNOSIS — J189 Pneumonia, unspecified organism: Secondary | ICD-10-CM | POA: Diagnosis present

## 2019-12-09 DIAGNOSIS — Z6841 Body Mass Index (BMI) 40.0 and over, adult: Secondary | ICD-10-CM

## 2019-12-09 HISTORY — DX: Chronic diastolic (congestive) heart failure: I50.32

## 2019-12-09 LAB — COMPREHENSIVE METABOLIC PANEL
ALT: 13 U/L (ref 0–44)
AST: 16 U/L (ref 15–41)
Albumin: 3 g/dL — ABNORMAL LOW (ref 3.5–5.0)
Alkaline Phosphatase: 66 U/L (ref 38–126)
Anion gap: 14 (ref 5–15)
BUN: 20 mg/dL (ref 8–23)
CO2: 27 mmol/L (ref 22–32)
Calcium: 8.8 mg/dL — ABNORMAL LOW (ref 8.9–10.3)
Chloride: 98 mmol/L (ref 98–111)
Creatinine, Ser: 1.06 mg/dL — ABNORMAL HIGH (ref 0.44–1.00)
GFR calc Af Amer: >60 mL/min (ref >60)
GFR calc non Af Amer: 55 mL/min — ABNORMAL LOW (ref >60)
Glucose, Bld: 277 mg/dL — ABNORMAL HIGH (ref 70–99)
Potassium: 2.9 mmol/L — ABNORMAL LOW (ref 3.5–5.1)
Sodium: 139 mmol/L (ref 135–145)
Total Bilirubin: 0.5 mg/dL (ref 0.3–1.2)
Total Protein: 7.6 g/dL (ref 6.5–8.1)

## 2019-12-09 LAB — CBC
HCT: 36.2 % (ref 36.0–46.0)
Hemoglobin: 11 g/dL — ABNORMAL LOW (ref 12.0–15.0)
MCH: 25.3 pg — ABNORMAL LOW (ref 26.0–34.0)
MCHC: 30.4 g/dL (ref 30.0–36.0)
MCV: 83.4 fL (ref 80.0–100.0)
Platelets: 316 10*3/uL (ref 150–400)
RBC: 4.34 MIL/uL (ref 3.87–5.11)
RDW: 14.2 % (ref 11.5–15.5)
WBC: 11.3 10*3/uL — ABNORMAL HIGH (ref 4.0–10.5)
nRBC: 0 % (ref 0.0–0.2)

## 2019-12-09 MED ORDER — SODIUM CHLORIDE 0.9% FLUSH: 3.0000 mL | Freq: Once | INTRAVENOUS | Status: DC

## 2019-12-09 NOTE — ED Triage Notes (Signed)
To ED for eval of constipation for since 6/12. States she was discharged from the hospital since 6/4 - was in for chf. Normal BM is every other day. Pt states she was seen at [facility] on 6/12 for same and told to start miralax but not working. Taking fiber therapy, suppositories, and laxative without relief. No vomiting. Pt appears in nad.

## 2019-12-09 NOTE — ED Notes (Addendum)
No answer for VS x1 

## 2019-12-10 ENCOUNTER — Emergency Department (HOSPITAL_COMMUNITY): Payer: Medicaid Other

## 2019-12-10 ENCOUNTER — Other Ambulatory Visit: Payer: Self-pay

## 2019-12-10 ENCOUNTER — Encounter (HOSPITAL_COMMUNITY): Payer: Self-pay | Admitting: Internal Medicine

## 2019-12-10 DIAGNOSIS — N12 Tubulo-interstitial nephritis, not specified as acute or chronic: Secondary | ICD-10-CM | POA: Diagnosis not present

## 2019-12-10 DIAGNOSIS — Z6841 Body Mass Index (BMI) 40.0 and over, adult: Secondary | ICD-10-CM | POA: Diagnosis not present

## 2019-12-10 DIAGNOSIS — E785 Hyperlipidemia, unspecified: Secondary | ICD-10-CM | POA: Diagnosis present

## 2019-12-10 DIAGNOSIS — M7989 Other specified soft tissue disorders: Secondary | ICD-10-CM

## 2019-12-10 DIAGNOSIS — K8020 Calculus of gallbladder without cholecystitis without obstruction: Secondary | ICD-10-CM | POA: Diagnosis present

## 2019-12-10 DIAGNOSIS — R0602 Shortness of breath: Secondary | ICD-10-CM | POA: Diagnosis not present

## 2019-12-10 DIAGNOSIS — G4733 Obstructive sleep apnea (adult) (pediatric): Secondary | ICD-10-CM | POA: Diagnosis present

## 2019-12-10 DIAGNOSIS — J9601 Acute respiratory failure with hypoxia: Secondary | ICD-10-CM | POA: Diagnosis not present

## 2019-12-10 DIAGNOSIS — E041 Nontoxic single thyroid nodule: Secondary | ICD-10-CM | POA: Diagnosis present

## 2019-12-10 DIAGNOSIS — I2510 Atherosclerotic heart disease of native coronary artery without angina pectoris: Secondary | ICD-10-CM | POA: Diagnosis present

## 2019-12-10 DIAGNOSIS — K5900 Constipation, unspecified: Secondary | ICD-10-CM | POA: Diagnosis present

## 2019-12-10 DIAGNOSIS — Z794 Long term (current) use of insulin: Secondary | ICD-10-CM | POA: Diagnosis not present

## 2019-12-10 DIAGNOSIS — I69393 Ataxia following cerebral infarction: Secondary | ICD-10-CM | POA: Diagnosis not present

## 2019-12-10 DIAGNOSIS — N281 Cyst of kidney, acquired: Secondary | ICD-10-CM | POA: Diagnosis not present

## 2019-12-10 DIAGNOSIS — Z20822 Contact with and (suspected) exposure to covid-19: Secondary | ICD-10-CM | POA: Diagnosis present

## 2019-12-10 DIAGNOSIS — Z7982 Long term (current) use of aspirin: Secondary | ICD-10-CM | POA: Diagnosis not present

## 2019-12-10 DIAGNOSIS — J189 Pneumonia, unspecified organism: Secondary | ICD-10-CM | POA: Diagnosis present

## 2019-12-10 DIAGNOSIS — Z79899 Other long term (current) drug therapy: Secondary | ICD-10-CM | POA: Diagnosis not present

## 2019-12-10 DIAGNOSIS — K8070 Calculus of gallbladder and bile duct without cholecystitis without obstruction: Secondary | ICD-10-CM | POA: Diagnosis present

## 2019-12-10 DIAGNOSIS — E876 Hypokalemia: Secondary | ICD-10-CM | POA: Diagnosis not present

## 2019-12-10 DIAGNOSIS — I110 Hypertensive heart disease with heart failure: Secondary | ICD-10-CM | POA: Diagnosis present

## 2019-12-10 DIAGNOSIS — Z841 Family history of disorders of kidney and ureter: Secondary | ICD-10-CM | POA: Diagnosis not present

## 2019-12-10 DIAGNOSIS — A419 Sepsis, unspecified organism: Secondary | ICD-10-CM | POA: Diagnosis not present

## 2019-12-10 DIAGNOSIS — E669 Obesity, unspecified: Secondary | ICD-10-CM | POA: Diagnosis present

## 2019-12-10 DIAGNOSIS — R609 Edema, unspecified: Secondary | ICD-10-CM | POA: Diagnosis not present

## 2019-12-10 DIAGNOSIS — E1165 Type 2 diabetes mellitus with hyperglycemia: Secondary | ICD-10-CM | POA: Diagnosis not present

## 2019-12-10 DIAGNOSIS — I10 Essential (primary) hypertension: Secondary | ICD-10-CM | POA: Diagnosis not present

## 2019-12-10 DIAGNOSIS — D259 Leiomyoma of uterus, unspecified: Secondary | ICD-10-CM | POA: Diagnosis not present

## 2019-12-10 DIAGNOSIS — I700 Atherosclerosis of aorta: Secondary | ICD-10-CM | POA: Diagnosis not present

## 2019-12-10 DIAGNOSIS — Z885 Allergy status to narcotic agent status: Secondary | ICD-10-CM | POA: Diagnosis not present

## 2019-12-10 DIAGNOSIS — I5032 Chronic diastolic (congestive) heart failure: Secondary | ICD-10-CM | POA: Diagnosis present

## 2019-12-10 DIAGNOSIS — Z791 Long term (current) use of non-steroidal anti-inflammatories (NSAID): Secondary | ICD-10-CM | POA: Diagnosis not present

## 2019-12-10 DIAGNOSIS — Z833 Family history of diabetes mellitus: Secondary | ICD-10-CM | POA: Diagnosis not present

## 2019-12-10 DIAGNOSIS — R0902 Hypoxemia: Secondary | ICD-10-CM | POA: Diagnosis not present

## 2019-12-10 DIAGNOSIS — K5730 Diverticulosis of large intestine without perforation or abscess without bleeding: Secondary | ICD-10-CM | POA: Diagnosis not present

## 2019-12-10 LAB — TROPONIN I (HIGH SENSITIVITY)
Troponin I (High Sensitivity): 34 ng/L — ABNORMAL HIGH (ref <18)
Troponin I (High Sensitivity): 39 ng/L — ABNORMAL HIGH (ref <18)

## 2019-12-10 LAB — URINALYSIS, ROUTINE W REFLEX MICROSCOPIC
Bilirubin Urine: NEGATIVE
Glucose, UA: NEGATIVE mg/dL
Ketones, ur: NEGATIVE mg/dL
Nitrite: NEGATIVE
Protein, ur: >=300 mg/dL — AB
Specific Gravity, Urine: 1.015 (ref 1.005–1.030)
WBC, UA: >50 WBC/hpf — ABNORMAL HIGH (ref 0–5)
pH: 5 (ref 5.0–8.0)

## 2019-12-10 LAB — LACTIC ACID, PLASMA
Lactic Acid, Venous: 1.5 mmol/L (ref 0.5–1.9)
Lactic Acid, Venous: 1.3 mmol/L (ref 0.5–1.9)

## 2019-12-10 LAB — BRAIN NATRIURETIC PEPTIDE: B Natriuretic Peptide: 266.9 pg/mL — ABNORMAL HIGH (ref 0.0–100.0)

## 2019-12-10 LAB — D-DIMER, QUANTITATIVE: D-Dimer, Quant: 6.86 ug/mL-FEU — ABNORMAL HIGH (ref 0.00–0.50)

## 2019-12-10 LAB — MAGNESIUM: Magnesium: 1.7 mg/dL (ref 1.7–2.4)

## 2019-12-10 LAB — SARS CORONAVIRUS 2 BY RT PCR (HOSPITAL ORDER, PERFORMED IN ~~LOC~~ HOSPITAL LAB): SARS Coronavirus 2: NEGATIVE

## 2019-12-10 LAB — PROTIME-INR
INR: 1.3 — ABNORMAL HIGH (ref 0.8–1.2)
Prothrombin Time: 15.4 seconds — ABNORMAL HIGH (ref 11.4–15.2)

## 2019-12-10 LAB — GLUCOSE, CAPILLARY: Glucose-Capillary: 232 mg/dL — ABNORMAL HIGH (ref 70–99)

## 2019-12-10 LAB — APTT: aPTT: 32 seconds (ref 24–36)

## 2019-12-10 MED ORDER — AMLODIPINE BESYLATE 10 MG PO TABS
10.0000 mg | ORAL_TABLET | Freq: Every day | ORAL | Status: DC
  Administered 2019-12-11 – 2019-12-13 (×3): 10 mg via ORAL
  Filled 2019-12-10 (×3): qty 1

## 2019-12-10 MED ORDER — SODIUM CHLORIDE 0.9 % IV SOLN
1.0000 g | INTRAVENOUS | Status: DC
  Administered 2019-12-11 – 2019-12-12 (×2): 1 g via INTRAVENOUS
  Filled 2019-12-10: qty 0.05
  Filled 2019-12-10 (×2): qty 10

## 2019-12-10 MED ORDER — HYDRALAZINE HCL 20 MG/ML IJ SOLN: 5.0000 mg | INTRAMUSCULAR | Status: DC | PRN

## 2019-12-10 MED ORDER — CEFTRIAXONE SODIUM 1 G IJ SOLR
1.0000 g | Freq: Once | INTRAMUSCULAR | Status: AC
  Administered 2019-12-10: 1 g via INTRAMUSCULAR
  Filled 2019-12-10: qty 10

## 2019-12-10 MED ORDER — INSULIN ASPART 100 UNIT/ML ~~LOC~~ SOLN
0.0000 [IU] | Freq: Every day | SUBCUTANEOUS | Status: DC
  Administered 2019-12-10: 2 [IU] via SUBCUTANEOUS

## 2019-12-10 MED ORDER — SODIUM CHLORIDE 0.9 % IV SOLN
500.0000 mg | INTRAVENOUS | Status: DC
  Administered 2019-12-10: 500 mg via INTRAVENOUS
  Filled 2019-12-10 (×2): qty 500

## 2019-12-10 MED ORDER — CALCIUM POLYCARBOPHIL 625 MG PO TABS
1250.0000 mg | ORAL_TABLET | Freq: Every day | ORAL | Status: DC | PRN
  Administered 2019-12-11: 1250 mg via ORAL
  Filled 2019-12-10 (×5): qty 2

## 2019-12-10 MED ORDER — LISINOPRIL 40 MG PO TABS
40.0000 mg | ORAL_TABLET | Freq: Every day | ORAL | Status: DC
  Administered 2019-12-11 – 2019-12-13 (×3): 40 mg via ORAL
  Filled 2019-12-10 (×3): qty 1

## 2019-12-10 MED ORDER — DOCUSATE SODIUM 100 MG PO CAPS
100.0000 mg | ORAL_CAPSULE | Freq: Two times a day (BID) | ORAL | Status: DC
  Administered 2019-12-10 – 2019-12-13 (×6): 100 mg via ORAL
  Filled 2019-12-10 (×6): qty 1

## 2019-12-10 MED ORDER — IOHEXOL 300 MG/ML  SOLN
100.0000 mL | Freq: Once | INTRAMUSCULAR | Status: AC | PRN
  Administered 2019-12-10: 100 mL via INTRAVENOUS

## 2019-12-10 MED ORDER — ZOLPIDEM TARTRATE 5 MG PO TABS
5.0000 mg | ORAL_TABLET | Freq: Every evening | ORAL | Status: DC | PRN
  Administered 2019-12-11 – 2019-12-12 (×2): 5 mg via ORAL
  Filled 2019-12-10 (×2): qty 1

## 2019-12-10 MED ORDER — ONDANSETRON HCL 4 MG PO TABS: 4.0000 mg | ORAL_TABLET | Freq: Four times a day (QID) | ORAL | Status: DC | PRN

## 2019-12-10 MED ORDER — ONDANSETRON HCL 4 MG/2ML IJ SOLN
4.0000 mg | Freq: Four times a day (QID) | INTRAMUSCULAR | Status: DC | PRN
  Administered 2019-12-11: 4 mg via INTRAVENOUS
  Filled 2019-12-10: qty 2

## 2019-12-10 MED ORDER — POTASSIUM CHLORIDE 10 MEQ/100ML IV SOLN
10.0000 meq | Freq: Once | INTRAVENOUS | Status: AC
  Administered 2019-12-10: 10 meq via INTRAVENOUS
  Filled 2019-12-10: qty 100

## 2019-12-10 MED ORDER — SODIUM CHLORIDE 0.9 % IV BOLUS
1000.0000 mL | Freq: Once | INTRAVENOUS | Status: AC
  Administered 2019-12-10: 1000 mL via INTRAVENOUS

## 2019-12-10 MED ORDER — TAMSULOSIN HCL 0.4 MG PO CAPS
0.4000 mg | ORAL_CAPSULE | Freq: Every day | ORAL | Status: DC
  Administered 2019-12-11 – 2019-12-13 (×3): 0.4 mg via ORAL
  Filled 2019-12-10 (×3): qty 1

## 2019-12-10 MED ORDER — POTASSIUM CHLORIDE CRYS ER 20 MEQ PO TBCR
40.0000 meq | EXTENDED_RELEASE_TABLET | Freq: Once | ORAL | Status: AC
  Administered 2019-12-10: 40 meq via ORAL
  Filled 2019-12-10: qty 2

## 2019-12-10 MED ORDER — ASPIRIN EC 81 MG PO TBEC
81.0000 mg | DELAYED_RELEASE_TABLET | ORAL | Status: DC
  Administered 2019-12-11 – 2019-12-13 (×3): 81 mg via ORAL
  Filled 2019-12-10 (×3): qty 1

## 2019-12-10 MED ORDER — CEFTRIAXONE SODIUM 1 G IJ SOLR: 1.0000 g | INTRAMUSCULAR | Status: DC

## 2019-12-10 MED ORDER — STERILE WATER FOR INJECTION IJ SOLN
INTRAMUSCULAR | Status: AC
  Administered 2019-12-10: 10 mL
  Filled 2019-12-10: qty 10

## 2019-12-10 MED ORDER — FUROSEMIDE 80 MG PO TABS
80.0000 mg | ORAL_TABLET | Freq: Every day | ORAL | Status: DC
  Administered 2019-12-10 – 2019-12-13 (×4): 80 mg via ORAL
  Filled 2019-12-10: qty 4
  Filled 2019-12-10 (×3): qty 1

## 2019-12-10 MED ORDER — ACETAMINOPHEN 650 MG RE SUPP: 650.0000 mg | Freq: Four times a day (QID) | RECTAL | Status: DC | PRN

## 2019-12-10 MED ORDER — ENOXAPARIN SODIUM 40 MG/0.4ML ~~LOC~~ SOLN
40.0000 mg | SUBCUTANEOUS | Status: DC
  Administered 2019-12-10 – 2019-12-12 (×4): 40 mg via SUBCUTANEOUS
  Filled 2019-12-10 (×3): qty 0.4

## 2019-12-10 MED ORDER — MORPHINE SULFATE (PF) 2 MG/ML IV SOLN: 2.0000 mg | INTRAVENOUS | Status: DC | PRN

## 2019-12-10 MED ORDER — HYDROCODONE-ACETAMINOPHEN 5-325 MG PO TABS: 1.0000 | ORAL_TABLET | ORAL | Status: DC | PRN

## 2019-12-10 MED ORDER — POLYETHYLENE GLYCOL 3350 17 G PO PACK
17.0000 g | PACK | Freq: Every day | ORAL | Status: DC
  Administered 2019-12-10 – 2019-12-11 (×2): 17 g via ORAL
  Filled 2019-12-10 (×2): qty 1

## 2019-12-10 MED ORDER — INSULIN GLARGINE 100 UNIT/ML ~~LOC~~ SOLN
40.0000 [IU] | Freq: Every day | SUBCUTANEOUS | Status: DC
  Administered 2019-12-10 – 2019-12-13 (×4): 40 [IU] via SUBCUTANEOUS
  Filled 2019-12-10 (×4): qty 0.4

## 2019-12-10 MED ORDER — ONDANSETRON HCL 4 MG/2ML IJ SOLN
4.0000 mg | Freq: Once | INTRAMUSCULAR | Status: AC
  Administered 2019-12-10: 4 mg via INTRAVENOUS
  Filled 2019-12-10: qty 2

## 2019-12-10 MED ORDER — SODIUM CHLORIDE 0.9 % IV SOLN: INTRAVENOUS | Status: DC

## 2019-12-10 MED ORDER — INSULIN ASPART 100 UNIT/ML ~~LOC~~ SOLN
0.0000 [IU] | Freq: Three times a day (TID) | SUBCUTANEOUS | Status: DC
  Administered 2019-12-11 (×3): 3 [IU] via SUBCUTANEOUS
  Administered 2019-12-12: 2 [IU] via SUBCUTANEOUS
  Administered 2019-12-12 (×2): 3 [IU] via SUBCUTANEOUS
  Administered 2019-12-13 (×2): 2 [IU] via SUBCUTANEOUS

## 2019-12-10 MED ORDER — ACETAMINOPHEN 325 MG PO TABS
650.0000 mg | ORAL_TABLET | Freq: Four times a day (QID) | ORAL | Status: DC | PRN
  Administered 2019-12-12 – 2019-12-13 (×3): 650 mg via ORAL
  Filled 2019-12-10 (×3): qty 2

## 2019-12-10 MED ORDER — ATORVASTATIN CALCIUM 10 MG PO TABS
20.0000 mg | ORAL_TABLET | Freq: Every day | ORAL | Status: DC
  Administered 2019-12-10 – 2019-12-12 (×3): 20 mg via ORAL
  Filled 2019-12-10 (×3): qty 2

## 2019-12-10 MED ORDER — IOHEXOL 350 MG/ML SOLN
75.0000 mL | Freq: Once | INTRAVENOUS | Status: AC | PRN
  Administered 2019-12-10: 64 mL via INTRAVENOUS

## 2019-12-10 MED ORDER — BISACODYL 10 MG RE SUPP: 10.0000 mg | Freq: Every day | RECTAL | Status: DC | PRN

## 2019-12-10 MED ORDER — NYSTATIN 100000 UNIT/GM EX POWD
Freq: Once | CUTANEOUS | Status: AC
  Filled 2019-12-10: qty 15

## 2019-12-10 MED ORDER — MORPHINE SULFATE (PF) 4 MG/ML IV SOLN
4.0000 mg | Freq: Once | INTRAVENOUS | Status: AC
  Administered 2019-12-10: 4 mg via INTRAVENOUS
  Filled 2019-12-10: qty 1

## 2019-12-10 MED ORDER — SENNA 8.6 MG PO TABS
8.6000 mg | ORAL_TABLET | Freq: Every day | ORAL | Status: DC | PRN
  Administered 2019-12-10: 8.6 mg via ORAL
  Filled 2019-12-10 (×2): qty 1

## 2019-12-10 NOTE — Progress Notes (Signed)
Notified bedside nurse of need to draw repeat lactic acid. 

## 2019-12-10 NOTE — ED Notes (Signed)
Pt vomited after giving PO potassium.

## 2019-12-10 NOTE — Progress Notes (Signed)
Right lower extremity venous duplex complete.  Please see CV Proc tab for preliminary results. Levin Bacon- RDMS, RVT 6:52 PM  12/10/2019

## 2019-12-10 NOTE — ED Provider Notes (Signed)
[facility] EMERGENCY DEPARTMENT Provider Note   CSN: [id number] Arrival date & time: 12/09/19  1803     History Chief Complaint  Patient presents with  . Abdominal Pain  . Constipation    Whitney Sloan is a 64 y.o. female.  The history is provided by the patient and medical records. No language interpreter was used.  Abdominal Pain Associated symptoms: constipation   Constipation Associated symptoms: abdominal pain      64 year old female with history of diabetes, hypertension, hyperlipidemia, presenting for evaluation of abdominal pain.  Patient report for more than 2 weeks she has had sensation of constipation.  States she endorsed pressure sensation to her lower abdomen, feeling constipated producing minimal bowel movement with small stool caliber.  Endorsed occasional bouts of nausea and occasional nonbloody nonbilious vomiting.  She has not had a normal bowel movement in quite a while, states she used to have bowel movement daily.  Able to pass flatus.  She has a decrease in appetite.  She does not complain of any fever chills no chest pain or shortness of breath no dysuria.  She denies alcohol or tobacco abuse.  No known history of active cancer.  She mention been seen several times for symptoms.  States she has been taking multiple laxative medication as well as suppository, and fiber pills without relief.  Past Medical History:  Diagnosis Date  . Diabetes mellitus   . Hyperlipemia   . Hypertension   . Psoriasis     Patient Active Problem List   Diagnosis Date Noted  . Acute cardiogenic pulmonary edema (HCC) 11/08/2019  . Acute respiratory failure with hypoxia (HCC) 11/08/2019  . Acute CHF (congestive heart failure) (HCC) 11/08/2019  . Hypertensive crisis 11/08/2019  . Mixed diabetic hyperlipidemia associated with type 2 diabetes mellitus (HCC) 11/08/2019  . Type 2 diabetes mellitus with diabetic polyneuropathy, with long-term current use of  insulin (HCC) 08/08/2019  . Diabetes mellitus (HCC) 08/08/2019  . HYPERLIPIDEMIA 03/23/2010  . DECREASED HEARING, BILATERAL 02/05/2010  . MEMORY LOSS 02/05/2010  . INCONTINENCE 02/05/2010  . NEOPLASM, MALIGNANT, BREAST, HX OF 01/13/2010  . ANXIETY STATE, UNSPECIFIED 12/02/2009  . UNSTEADY GAIT 12/02/2009  . TOE PAIN 09/24/2009  . ANAL OR RECTAL PAIN 08/25/2009  . LEG EDEMA, BILATERAL 08/25/2009  . Uncontrolled type 2 diabetes mellitus with hyperglycemia, with long-term current use of insulin (HCC) 08/06/2009  . Essential hypertension 08/06/2009  . DERMATITIS, ATOPIC 08/06/2009    Past Surgical History:  Procedure Laterality Date  . ANAL FISSURE REPAIR    . ENDOMETRIAL ABLATION       OB History   No obstetric history on file.     Family History  Problem Relation Age of Onset  . Dementia Mother   . Renal Disease Father   . Diabetes Other     Social History   Tobacco Use  . Smoking status: Never Smoker  . Smokeless tobacco: Never Used  Substance Use Topics  . Alcohol use: No  . Drug use: No    Home Medications Prior to Admission medications   Medication Sig Start Date End Date Taking? Authorizing Provider  amLODipine (NORVASC) 10 MG tablet Take 1 tablet (10 mg total) by mouth daily. 11/16/19   Burnadette Pop, MD  aspirin (ASPIR-LOW) 81 MG EC tablet Take 81 mg by mouth every morning.     [provider]  atorvastatin (LIPITOR) 20 MG tablet Take 1 tablet (20 mg total) by mouth at bedtime. 11/15/19  Burnadette Pop, MD  Cholecalciferol (VITAMIN D PO) Take 1 capsule by mouth daily.    [provider]  ezetimibe (ZETIA) 10 MG tablet Take 10 mg by mouth at bedtime. 08/21/19   [provider]  furosemide (LASIX) 80 MG tablet Take 1 tablet (80 mg total) by mouth daily. 12/05/19   Little Ishikawa, MD  ibuprofen (ADVIL) 200 MG tablet Take 200 mg by mouth every 6 (six) hours as needed for fever, headache or moderate pain.    [provider]  insulin aspart (NOVOLOG FLEXPEN) 100 UNIT/ML FlexPen Inject 16 Units into the skin 3 (three) times daily with meals. 08/07/19   Shamleffer, Konrad Dolores, MD  insulin glargine (LANTUS SOLOSTAR) 100 UNIT/ML Solostar Pen Inject 40 Units into the skin daily. 11/21/19   Shamleffer, Konrad Dolores, MD  lisinopril (ZESTRIL) 40 MG tablet Take 1 tablet (40 mg total) by mouth daily. 11/15/19   Burnadette Pop, MD  metFORMIN (GLUCOPHAGE) 500 MG tablet Take 1,000 mg by mouth 2 (two) times daily.  10/21/19   [provider]  tamsulosin (FLOMAX) 0.4 MG CAPS capsule Take 1 capsule (0.4 mg total) by mouth daily. 11/16/19   Burnadette Pop, MD  UNIFINE PENTIPS 31G X 6 MM MISC USE TO INJECT INSULIN 03/21/11   Edd Arbour, MD    Allergies    Oxycodone-acetaminophen  Review of Systems   Review of Systems  Gastrointestinal: Positive for abdominal pain and constipation.  All other systems reviewed and are negative.   Physical Exam Updated Vital Signs BP (!) 134/101 (BP Location: Right Arm)   Pulse (!) 110   Temp 98.7 F (37.1 C) (Oral)   Resp 19   Ht 5\' 5"  (1.651 m)   Wt 108.9 kg   SpO2 95%   BMI 39.94 kg/m   Physical Exam Vitals and nursing note reviewed. Exam conducted with a chaperone present.  Constitutional:      General: She is not in acute distress.    Appearance: She is well-developed. She is obese.  HENT:     Head: Atraumatic.  Eyes:     Conjunctiva/sclera: Conjunctivae normal.  Cardiovascular:     Rate and Rhythm: Tachycardia present.  Pulmonary:     Effort: Pulmonary effort is normal.     Breath sounds: Normal breath sounds. No wheezing, rhonchi or rales.  Abdominal:     General: Abdomen is protuberant. Bowel sounds are decreased.     Palpations: Abdomen is soft.     Tenderness: There is abdominal tenderness in the suprapubic area.     Hernia: No hernia is present.  Genitourinary:    Rectum: Normal. Guaiac result negative. No mass or tenderness.  Musculoskeletal:      Cervical back: Neck supple.  Skin:    Findings: Rash (Abdominal wall: In between the skin fold of the pannus there is significant skin erythema and macerated skin change consistent with candidal infection) present.  Neurological:     Mental Status: She is alert.     ED Results / Procedures / Treatments   Labs (all labs ordered are listed, but only abnormal results are displayed) Labs Reviewed  COMPREHENSIVE METABOLIC PANEL - Abnormal; Notable for the following components:      Result Value   Potassium 2.9 (*)    Glucose, Bld 277 (*)    Creatinine, Ser 1.06 (*)    Calcium 8.8 (*)    Albumin 3.0 (*)    GFR calc non Af Amer 55 (*)  All other components within normal limits  CBC - Abnormal; Notable for the following components:   WBC 11.3 (*)    Hemoglobin 11.0 (*)    MCH 25.3 (*)    All other components within normal limits  URINALYSIS, ROUTINE W REFLEX MICROSCOPIC - Abnormal; Notable for the following components:   Color, Urine AMBER (*)    APPearance CLOUDY (*)    Hgb urine dipstick MODERATE (*)    Protein, ur >=300 (*)    Leukocytes,Ua LARGE (*)    WBC, UA >50 (*)    Bacteria, UA MANY (*)    All other components within normal limits  D-DIMER, QUANTITATIVE (NOT AT [facility]) - Abnormal; Notable for the following components:   D-Dimer, Quant 6.86 (*)    All other components within normal limits  BRAIN NATRIURETIC PEPTIDE - Abnormal; Notable for the following components:   B Natriuretic Peptide 266.9 (*)    All other components within normal limits  TROPONIN I (HIGH SENSITIVITY) - Abnormal; Notable for the following components:   Troponin I (High Sensitivity) 34 (*)    All other components within normal limits  SARS CORONAVIRUS 2 BY RT PCR (HOSPITAL ORDER, PERFORMED IN [facility])  URINE CULTURE  MAGNESIUM  LACTIC ACID, PLASMA  LACTIC ACID, PLASMA  TROPONIN I (HIGH SENSITIVITY)    EKG EKG Interpretation  Date/Time:  Tuesday December 10 2019 08:40:04  EDT Ventricular Rate:  120 PR Interval:    QRS Duration: 94 QT Interval:  326 QTC Calculation: 461 R Axis:   52 Text Interpretation: Sinus tachycardia Nonspecific T abnormalities, lateral leads Confirmed by Virgina Norfolk [phone]) on 12/10/2019 8:52:47 AM   Radiology CT ABDOMEN PELVIS W CONTRAST  Result Date: 12/10/2019 CLINICAL DATA:  Constipation EXAM: CT ABDOMEN AND PELVIS WITH CONTRAST TECHNIQUE: Multidetector CT imaging of the abdomen and pelvis was performed using the standard protocol following bolus administration of intravenous contrast. CONTRAST:  OMNIPAQUE IOHEXOL 300 MG/ML  SOLN COMPARISON:  None. FINDINGS: Lower chest: Mild subpleural patchy opacities in the lung bases, likely dependent atelectasis. Hepatobiliary: Liver is within normal limits. Layering gallstones in the gallbladder fundus (series 4/image 37), without associated inflammatory changes. No intrahepatic or extrahepatic ductal dilatation. Pancreas: Within normal limits. Spleen: Within normal limits. Adrenals/Urinary Tract: Adrenal glands are within normal limits. 14 mm cyst in the posterior right upper kidney (series 4/image 36). Additional 9 mm probable cyst in the anterior left upper kidney. Mildly heterogeneous enhancement of the bilateral kidneys with mild urothelial enhancement of the bilateral proximal collecting system/ureters, left greater than right (series 4/image 46). Mild nonspecific perinephric/periureteral stranding. No hydronephrosis. Bladder is within normal limits. Stomach/Bowel: Stomach is within normal limits. No evidence of bowel obstruction. Normal appendix (series 4/image 62). Mild left colonic diverticulosis, without evidence of diverticulitis. Normal colonic stool burden. Vascular/Lymphatic: No evidence of abdominal aortic aneurysm. Atherosclerotic calcifications of the abdominal aorta and branch vessels. No suspicious abdominopelvic lymphadenopathy. Reproductive: Heterogeneous appearance of the  uterus, suggesting uterine fibroids. Right ovary is within normal limits.  No left adnexal mass. Other: No abdominopelvic ascites. Musculoskeletal: Mild degenerative changes of the visualized thoracolumbar spine. IMPRESSION: No evidence of bowel obstruction. Normal appendix. Mild left colonic diverticulosis, without evidence of diverticulitis. Normal colonic stool burden. Mild urothelial enhancement involving the bilateral renal collecting systems with nonspecific periureteral stranding, raising concern for infection. Cholelithiasis, without associated inflammatory changes. Electronically Signed   By: Charline Bills M.D.   On: 12/10/2019 12:01   DG Chest Portable 1 View  Result Date: 12/10/2019 CLINICAL DATA:  Hypoxia. EXAM: PORTABLE CHEST 1 VIEW COMPARISON:  Single-view of the chest 11/11/2019. FINDINGS: The patient is rotated to the left increasing density over the left chest. The lungs are clear. Heart size is normal. No pneumothorax or pleural effusion. Atherosclerosis is noted. No acute or focal bony abnormality. IMPRESSION: No acute disease. Aortic Atherosclerosis (ICD10-I70.0). Electronically Signed   By: Drusilla Kanner M.D.   On: 12/10/2019 13:36    Procedures .Critical Care Performed by: Fayrene Helper, PA-C Authorized by: Fayrene Helper, PA-C   Critical care provider statement:    Critical care time (minutes):  40   Critical care was time spent personally by me on the following activities:  Discussions with consultants, evaluation of patient's response to treatment, examination of patient, ordering and performing treatments and interventions, ordering and review of laboratory studies, ordering and review of radiographic studies, pulse oximetry, re-evaluation of patient's condition, obtaining history from patient or surrogate and review of old charts   (including critical care time)  Medications Ordered in ED Medications  sodium chloride flush (NS) 0.9 % injection 3 mL (has no  administration in time range)  sodium chloride 0.9 % bolus 1,000 mL (0 mLs Intravenous Stopped 12/10/19 1121)  morphine 4 MG/ML injection 4 mg (4 mg Intravenous Given 12/10/19 0856)  ondansetron (ZOFRAN) injection 4 mg (4 mg Intravenous Given 12/10/19 0855)  nystatin (MYCOSTATIN/NYSTOP) topical powder ( Topical Given 12/10/19 1138)  potassium chloride SA (KLOR-CON) CR tablet 40 mEq (40 mEq Oral Given 12/10/19 0855)  potassium chloride 10 mEq in 100 mL IVPB (0 mEq Intravenous Stopped 12/10/19 1010)  iohexol (OMNIPAQUE) 300 MG/ML solution 100 mL (100 mLs Intravenous Contrast Given 12/10/19 1136)  cefTRIAXone (ROCEPHIN) injection 1 g (1 g Intramuscular Given 12/10/19 1429)  sterile water (preservative free) injection (10 mLs  Given 12/10/19 1433)    ED Course  I have reviewed the triage vital signs and the nursing notes.  Pertinent labs & imaging results that were available during my care of the patient were reviewed by me and considered in my medical decision making (see chart for details).    MDM Rules/Calculators/A&P                          BP 98/76   Pulse (!) 122   Temp 98.7 F (37.1 C) (Oral)   Resp 19   Ht 5\' 5"  (1.651 m)   Wt 108.9 kg   SpO2 92%   BMI 39.94 kg/m   Final Clinical Impression(s) / ED Diagnoses Final diagnoses:  Pyelonephritis  Hypokalemia  Hypoxia  Sepsis, due to unspecified organism, unspecified whether acute organ dysfunction present (HCC)    Rx / DC Orders ED Discharge Orders    None     8:23 AM Patient here for evaluation of constipation and lower abdominal pain ongoing for more than 2 weeks.  She endorsed nausea and occasional bouts of vomiting with decreased appetite.  She has a fairly benign abdominal exam, rectal examination without impacted stool.  Examination of her skin reveals candidal infection to her lower pannus that is wide spreading.  Will apply nystatin cream.  Will obtain abdominal pelvis CT scan.  Patient was found to be tachycardic, and  hypokalemic with a potassium of 2.9.  Mild renal impairment with a creatinine of 1.06.  Will give IV fluid, pain medication and antinausea medication.  Will replenish her potassium, will obtain EKG.  1:10 PM UA shows large amount  of leukocyte esterase, moderate amount of hemoglobin and urine dipsticks and greater than 50 WBC and many bacteria.  Findings suggestive of urinary tract infection.  An abdominal CT scan obtained showed mild urethral enhancement involving bilateral renal collecting system with nonspecific periureteral stranding, raising concern for infection.  No other acute finding.  This finding is suggestive of an ascending UTI.  Although patient denies having any shortness of breath, she has a document O2 sats of 87%, is tachycardic with a heart rate of 117, and tachypneic with a respiratory rate of 27.  She does have history of CHF.  Will obtain x-ray of the chest, obtain COVID-19 testing as well as a D-dimer.  Rocephin given for UTI.  2:21 PM Patient has an elevated D-dimer of 6.86.  Although patient recently had an abdominal pelvis CT scan done today, due to new hypoxia requiring oxygenation, elevated D-dimer, I did discuss with our radiologist who recommend chest CT angiogram with reduced dose to rule out PE.  History of CHF in the past, BNP is 266.  Patient received IV fluid here, patient also given Rocephin as treatment for UTI.  Care discussed with Dr. Lockie Mola.    3:56 PM Appreciate consultation from [facility] Dr. Ophelia Charter who agrees to admit pt for hypoxia with oxygen requirement along with pyelonephritis.  Chest CTA is currently pending.  Covid negative.  Current blood pressure is 98/76.  In the setting of tachycardia, hypoxia, and soft blood pressure, code sepsis initiated.  Patient has received Rocephin.  Whitney Sloan was evaluated in Emergency Department on 12/10/2019 for the symptoms described in the history of present illness. She was evaluated in the context of the  global COVID-19 pandemic, which necessitated consideration that the patient might be at risk for infection with the SARS-CoV-2 virus that causes COVID-19. Institutional protocols and algorithms that pertain to the evaluation of patients at risk for COVID-19 are in a state of rapid change based on information released by regulatory bodies including the CDC and federal and state organizations. These policies and algorithms were followed during the patient's care in the ED.   Sepsis reassessment done.    Fayrene Helper, PA-C 12/10/19 1600    Virgina Norfolk, DO 12/10/19 1615

## 2019-12-10 NOTE — H&P (Signed)
History and Physical    MARIAGUADALUPE FIALKOWSKI WUX:[id number] DOB: September 11, 1955 DOA: 12/09/2019  PCP:  Mitzie Na Consultants:  Bjorn Pippin - cardiology; Shamleffer - endocrinology  Patient coming from:  Home - lives with daughter; NOK: Daughter, Chrisette Man, [phone]  Chief Complaint:  Constipation  HPI: Whitney Sloan is a 64 y.o. 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  She was previously hospitalized from 5/28-6/4 for acute diastolic CHF requiring BIPAP and diuresis.  She reports that she has been feeling constipated.  She initially went to Urgent Care on 6/12 for this issue and she was told to start taking Miralax and she was told to come to the ER if Miralax didn't work.  She took it for 2 weeks with Exlax, Dulcolax, prune juice, and other stuff and it didn't work so she came here.  +abdominal pain prior, now better after pain medication.  Not feeling SOB.  +LE edema, improving.    ED Course:  Hypoxia, ?PE.  C/o abdominal pain and constipation.  CT unremarkable, ?pyelo, abnormal UA but no symptoms.  Became hypoxic in the ER, O2 in 80s. Unremarkable CXR.  D-dimer in 6 range.  CTA pending.  Review of Systems: As per HPI; otherwise review of systems reviewed and negative.   Ambulatory Status:  Ambulates with cane  COVID Vaccine Status:  None  Past Medical History:  Diagnosis Date  . Chronic diastolic (congestive) heart failure (HCC)   . Diabetes mellitus   . Hyperlipemia   . Hypertension   . Psoriasis     Past Surgical History:  Procedure Laterality Date  . ANAL FISSURE REPAIR    . ENDOMETRIAL ABLATION      Social History   Socioeconomic History  . Marital status: Divorced    Spouse name: Not on file  . Number of children: Not on file  . Years of education: Not on file  . Highest education level: Not on file  Occupational History  .  Occupation: disabled  Tobacco Use  . Smoking status: Never Smoker  . Smokeless tobacco: Never Used  Substance and Sexual Activity  . Alcohol use: No  . Drug use: No  . Sexual activity: Not on file  Other Topics Concern  . Not on file  Social History Narrative  . Not on file   Social Determinants of Health   Financial Resource Strain:   . Difficulty of Paying Living Expenses:   Food Insecurity:   . Worried About Programme researcher, broadcasting/film/video in the Last Year:   . Barista in the Last Year:   Transportation Needs:   . Freight forwarder (Medical):   Marland Kitchen Lack of Transportation (Non-Medical):   Physical Activity:   . Days of Exercise per Week:   . Minutes of Exercise per Session:   Stress:   . Feeling of Stress :   Social Connections:   . Frequency of Communication with Friends and Family:   . Frequency of Social Gatherings with Friends and Family:   . Attends Religious Services:   . Active Member of Clubs or Organizations:   . Attends Banker Meetings:   Marland Kitchen Marital Status:   Intimate Partner Violence:   . Fear of Current or Ex-Partner:   . Emotionally Abused:   Marland Kitchen Physically Abused:   . Sexually Abused:     Allergies  Allergen Reactions  .  Oxycodone-Acetaminophen Nausea And Vomiting    Family History  Problem Relation Age of Onset  . Dementia Mother   . Renal Disease Father   . Diabetes Other     Prior to Admission medications   Medication Sig Start Date End Date Taking? Authorizing Provider  amLODipine (NORVASC) 10 MG tablet Take 1 tablet (10 mg total) by mouth daily. 11/16/19  Yes Burnadette Pop, MD  aspirin (ASPIR-LOW) 81 MG EC tablet Take 81 mg by mouth every morning.    Yes [provider]  atorvastatin (LIPITOR) 20 MG tablet Take 1 tablet (20 mg total) by mouth at bedtime. 11/15/19  Yes Burnadette Pop, MD  bisacodyl (DULCOLAX) 10 MG suppository Place 10 mg rectally daily as needed for moderate constipation.   Yes [provider]   Cholecalciferol (VITAMIN D PO) Take 1 capsule by mouth daily.   Yes [provider]  ezetimibe (ZETIA) 10 MG tablet Take 10 mg by mouth at bedtime. 08/21/19  Yes [provider]  furosemide (LASIX) 80 MG tablet Take 1 tablet (80 mg total) by mouth daily. 12/05/19  Yes Little Ishikawa, MD  ibuprofen (ADVIL) 200 MG tablet Take 200 mg by mouth every 6 (six) hours as needed for fever, headache or moderate pain.   Yes [provider]  insulin aspart (NOVOLOG FLEXPEN) 100 UNIT/ML FlexPen Inject 16 Units into the skin 3 (three) times daily with meals. 08/07/19  Yes Shamleffer, Konrad Dolores, MD  insulin glargine (LANTUS SOLOSTAR) 100 UNIT/ML Solostar Pen Inject 40 Units into the skin daily. 11/21/19  Yes Shamleffer, Konrad Dolores, MD  lisinopril (ZESTRIL) 40 MG tablet Take 1 tablet (40 mg total) by mouth daily. 11/15/19  Yes Burnadette Pop, MD  metFORMIN (GLUCOPHAGE) 500 MG tablet Take 1,000 mg by mouth 2 (two) times daily.  10/21/19  Yes [provider]  polycarbophil (FIBERCON) 625 MG tablet Take 1,250 mg by mouth daily as needed for mild constipation.   Yes [provider]  polyethylene glycol (MIRALAX / GLYCOLAX) 17 g packet Take 17 g by mouth daily.   Yes [provider]  Sennosides (EX-LAX) 15 MG TABS Take 15 mg by mouth daily as needed (constipation).   Yes [provider]  tamsulosin (FLOMAX) 0.4 MG CAPS capsule Take 1 capsule (0.4 mg total) by mouth daily. 11/16/19  Yes Burnadette Pop, MD  UNIFINE PENTIPS 31G X 6 MM MISC USE TO INJECT INSULIN 03/21/11   Edd Arbour, MD    Physical Exam: Vitals:   12/10/19 1445 12/10/19 1515 12/10/19 1545 12/10/19 1630  BP: (!) 142/97 130/71 129/72 131/76  Pulse: (!) 120 (!) 119 (!) 121 (!) 116  Resp: (!) 29 (!) 22 10 (!) 26  Temp:      TempSrc:      SpO2: 91% 91% 92% 92%  Weight:      Height:         . General:  Appears calm and comfortable and is NAD . Eyes:  PERRL, EOMI, normal  lids, iris . ENT:  grossly normal hearing, lips & tongue, mmm . Neck:  no LAD, masses or thyromegaly . Cardiovascular:  RR with tachycardia, no m/r/g.  . Respiratory:   CTA bilaterally with no wheezes/rales/rhonchi.  Increased respiratory effort with low 90s O2 sats on [place] O2 . Abdomen:  soft, NT, ND, NABS . Skin:  no rash or induration seen on limited exam . Musculoskeletal:  grossly normal tone BUE/BLE, good ROM, no bony abnormality . Lower extremity:  RLE is  markedly larger than LLE which patient states started yesterday.  No erythema, negative Homan's, no palpable cords. . Psychiatric:  grossly normal mood and affect, speech fluent and appropriate, AOx3 . Neurologic:  CN 2-12 grossly intact, moves all extremities in coordinated fashion    Radiological Exams on Admission: CT Angio Chest PE W and/or Wo Contrast  Result Date: 12/10/2019 CLINICAL DATA:  Chest pain. Shortness of breath. History of CHF. Positive D-dimer. EXAM: CT ANGIOGRAPHY CHEST WITH CONTRAST TECHNIQUE: Multidetector CT imaging of the chest was performed using the standard protocol during bolus administration of intravenous contrast. Multiplanar CT image reconstructions and MIPs were obtained to evaluate the vascular anatomy. CONTRAST:  34mL OMNIPAQUE IOHEXOL 350 MG/ML SOLN COMPARISON:  None. FINDINGS: Cardiovascular: There is no evidence for an acute pulmonary embolism. The main pulmonary artery is dilated measuring approximately 3.3 cm. There is cardiomegaly with coronary artery disease. There is a trace pericardial effusion. There are atherosclerotic changes of the thoracic aorta without evidence for an aneurysm or dissection. Mediastinum/Nodes: --there are enlarged mediastinal lymph nodes. For example there is a right paratracheal lymph node measuring up to approximately 1.2 cm (axial series 8, image 43). --there are mildly enlarged mediastinal lymph nodes, right greater than left. -- No axillary lymphadenopathy. -- No  supraclavicular lymphadenopathy. --there is asymmetric enlargement of the left thyroid gland with suggestion of a inferior left thyroid nodule measuring approximately 2.1 cm (axial series 8, image 25). -  Unremarkable esophagus. Lungs/Pleura: There is a mosaic appearance of the lung parenchyma bilaterally. There is segmental atelectasis in the left lower lobe. There is consolidation in the medial right lower lobe. There is no pneumothorax or significant pleural effusion. The trachea is unremarkable. Upper Abdomen: Contrast bolus timing is not optimized for evaluation of the abdominal organs. The visualized portions of the organs of the upper abdomen are normal. Musculoskeletal: No chest wall abnormality. No bony spinal canal stenosis. Review of the MIP images confirms the above findings. IMPRESSION: 1. There is no evidence for an acute pulmonary embolism. 2. There is consolidation in the medial right lower lobe, concerning for pneumonia. There is segmental atelectasis in the left lower lobe. 3. There is a mosaic appearance of the lung parenchyma bilaterally, which can be seen in patients with small airways disease. 4. Cardiomegaly with coronary artery disease. 5. Dilated main pulmonary artery which can be seen in patients with elevated pulmonary artery pressures. 6. Enlarged mediastinal lymph nodes, likely reactive. 7. Asymmetric enlargement of the left thyroid gland with suggestion of a inferior left thyroid nodule measuring approximately 2.1 cm. Recommend outpatient thyroid ultrasound for further evaluation.(Ref: J Am Coll Radiol. 2015 Feb;12(2): 143-50). Aortic Atherosclerosis (ICD10-I70.0). Electronically Signed   By: Katherine Mantle M.D.   On: 12/10/2019 16:58   CT ABDOMEN PELVIS W CONTRAST  Result Date: 12/10/2019 CLINICAL DATA:  Constipation EXAM: CT ABDOMEN AND PELVIS WITH CONTRAST TECHNIQUE: Multidetector CT imaging of the abdomen and pelvis was performed using the standard protocol following bolus  administration of intravenous contrast. CONTRAST:  OMNIPAQUE IOHEXOL 300 MG/ML  SOLN COMPARISON:  None. FINDINGS: Lower chest: Mild subpleural patchy opacities in the lung bases, likely dependent atelectasis. Hepatobiliary: Liver is within normal limits. Layering gallstones in the gallbladder fundus (series 4/image 37), without associated inflammatory changes. No intrahepatic or extrahepatic ductal dilatation. Pancreas: Within normal limits. Spleen: Within normal limits. Adrenals/Urinary Tract: Adrenal glands are within normal limits. 14 mm cyst in the posterior right upper kidney (series 4/image 36). Additional 9 mm probable cyst in the  anterior left upper kidney. Mildly heterogeneous enhancement of the bilateral kidneys with mild urothelial enhancement of the bilateral proximal collecting system/ureters, left greater than right (series 4/image 46). Mild nonspecific perinephric/periureteral stranding. No hydronephrosis. Bladder is within normal limits. Stomach/Bowel: Stomach is within normal limits. No evidence of bowel obstruction. Normal appendix (series 4/image 62). Mild left colonic diverticulosis, without evidence of diverticulitis. Normal colonic stool burden. Vascular/Lymphatic: No evidence of abdominal aortic aneurysm. Atherosclerotic calcifications of the abdominal aorta and branch vessels. No suspicious abdominopelvic lymphadenopathy. Reproductive: Heterogeneous appearance of the uterus, suggesting uterine fibroids. Right ovary is within normal limits.  No left adnexal mass. Other: No abdominopelvic ascites. Musculoskeletal: Mild degenerative changes of the visualized thoracolumbar spine. IMPRESSION: No evidence of bowel obstruction. Normal appendix. Mild left colonic diverticulosis, without evidence of diverticulitis. Normal colonic stool burden. Mild urothelial enhancement involving the bilateral renal collecting systems with nonspecific periureteral stranding, raising concern for infection.  Cholelithiasis, without associated inflammatory changes. Electronically Signed   By: Charline Bills M.D.   On: 12/10/2019 12:01   DG Chest Portable 1 View  Result Date: 12/10/2019 CLINICAL DATA:  Hypoxia. EXAM: PORTABLE CHEST 1 VIEW COMPARISON:  Single-view of the chest 11/11/2019. FINDINGS: The patient is rotated to the left increasing density over the left chest. The lungs are clear. Heart size is normal. No pneumothorax or pleural effusion. Atherosclerosis is noted. No acute or focal bony abnormality. IMPRESSION: No acute disease. Aortic Atherosclerosis (ICD10-I70.0). Electronically Signed   By: Drusilla Kanner M.D.   On: 12/10/2019 13:36    EKG: Independently reviewed.  Sinus tachycardia with rate 120; nonspecific ST changes with no evidence of acute ischemia   Labs on Admission: I have personally reviewed the available labs and imaging studies at the time of the admission.  Pertinent labs:   K+ 2.9 Glucose 277 BUN 20/Creatinine 1.06/GFR >60 BNP 266.9 HS troponin 34, 39 Lactate 1.5 WBC 11.3 D-dimer 6.86 UA: moderate Hgb, large LE, >300 protein, many bacteria, >50 WBC COVID negative   Assessment/Plan Principal Problem:   Acute respiratory failure with hypoxia (HCC) Active Problems:   Uncontrolled type 2 diabetes mellitus with hyperglycemia, with long-term current use of insulin (HCC)   Dyslipidemia   Essential hypertension   Diabetes mellitus (HCC)   Constipation   Cholelithiasis   Thyroid nodule   Obesity (BMI 30-39.9)   Acute respiratory failure with hypoxia -Patient presented to the ER with c/o constipation; while in the ER she became hypoxic -She was placed on NCO2 and has had persistent mild hypoxia as well as tachycardia -Marked RLE edema, not present on LLE -Markedly elevated D-dimer -CTA negative for PE but apparent RML PNA - not overly consistent with infection by evaluation -Her presentation is more c/w VTE -Will order RLE DVT US; if positive will start  Heparin per pharmacy (signed out to cross cover) -Will start empiric Rocephin/Azithro despite low suspicion for infectious etiology due to no alternate explanation at this time  -OHS is another consideration although generally would not be so acute or associated with tachycardia  Constipation -Patient with report of constipation, although this was not appreciated on CT abdomen -Will order bowel regimen -Abnormal UA without UTI symptoms, may be associated with constipation  Chronic diastolic CHF/CAD -Imaging with apparent CAD -Minimally elevated HS troponin with negative delta -No apparent ACS complaints at this time; she is scheduled for MPS tomorrow and it may be reasonable to complete  -BNP is elevated compared to prior -She was recently admitted for chronic diastolic CHF, grade  2 diastolic dysfunction seen on 5/28 echo -Other than asymmetric LE edema, no evidence of volume overload so low suspicion for decompensation at this time -Continue home Lasix  DM -Recent A1c indicates very poor control -hold Glucophage -Continue Lantus -Cover with moderate-scale SSI  HTN -Continue Norvasc  HLD -Continue Lipitor -Hold Zetia due to limited inpatient utility  Cholelithiasis -Appears to be an incidental finding -Outpatient f/u recommended  Thyroid nodule -Appears to be an incidental finding -Outpatient f/u recommended  Obesity Body mass index is 39.94 kg/m.  -Weight loss should be encouraged -Outpatient PCP/bariatric medicine f/u encouraged    Note: This patient has been tested and is negative for the novel coronavirus COVID-19.  DVT prophylaxis:  Lovenox  Code Status:  Full - confirmed with patient Family Communication: None present Disposition Plan:  The patient is from: home  Anticipated d/c is to: home without [facility] services   Anticipated d/c date will depend on clinical response to treatment, but possibly as early as tomorrow if she has excellent response to  treatment  Patient is currently: acutely ill Consults called: None Admission status:  It is my clinical opinion that referral for OBSERVATION is reasonable and necessary in this patient based on the above information provided. The aforementioned taken together are felt to place the patient at high risk for further clinical deterioration. However it is anticipated that the patient may be medically stable for discharge from the hospital within 24 to 48 hours.    Jonah Blue MD [facility]   How to contact the [facility] Attending or Consulting provider 7A - 7P or covering provider during after hours 7P -7A, for this patient?  1. Check the care team in [facility] and look for a) attending/consulting [facility] provider listed and b) the [facility] team listed 2. Log into [email or web address] and use [place]'s universal password to access. If you do not have the password, please contact the hospital operator. 3. Locate the [facility] provider you are looking for under [facility] and page to a number that you can be directly reached. 4. If you still have difficulty reaching the provider, please page the [facility] (Director on Call) for the Hospitalists listed on amion for assistance.   12/10/2019, 5:58 PM

## 2019-12-10 NOTE — Plan of Care (Signed)

## 2019-12-10 NOTE — ED Provider Notes (Signed)
Medical screening examination/treatment/procedure(s) were conducted as a shared visit with non-physician practitioner(s) and myself.  I personally evaluated the patient during the encounter. Briefly, the patient is a 64 y.o. female with history of diabetes, hypertension, high cholesterol who presents to the ED with abdominal pain.  Patient with overall unremarkable vitals except for tachycardia.  She appeared to have new hypoxia but did not endorse any shortness of breath.  Oxygen in the mid 80s with no oxygen upon my evaluation.  Patient has been in the ED for about 20 hours upon my evaluation.  She is already had extensive work-up that was done by my PA. He noticed hypoxia upon initial evaluation and expanded work-up to include a D-dimer, BNP.  EKG shows sinus tachycardia.  Patient not having any chest pain.  However will add troponin now.  D-dimer is elevated to 6.86.  BNP mildly elevated to 266.  Overall abdominal pain appears to be related to a urinary tract infection.  CT shows some inflammation of the renal collecting system likely from infection and urinalysis is consistent with an infection.  Given IV antibiotics.  Did get IV fluids already.  No leukocytosis but normal lactic acid.  Doubt sepsis.  As far as hypoxia could be secondary to a blood clot given elevated D-dimer.  Will get CT PE study.  She does have 2-3+ pitting edema in her legs bilaterally and could be fluid related.  She did get IV fluids already but she was hypoxic prior to this.  She does not endorse much shortness of breath.  Covid test is also pending.  Anticipate admission given hypoxia.  If PE study is negative will likely need diuresis.  Can get treatment for UTI.  This chart was dictated using voice recognition software.  Despite best efforts to proofread,  errors can occur which can change the documentation meaning.    EKG Interpretation  Date/Time:  Tuesday December 10 2019 08:40:04 EDT Ventricular Rate:  120 PR Interval:     QRS Duration: 94 QT Interval:  326 QTC Calculation: 461 R Axis:   52 Text Interpretation: Sinus tachycardia Nonspecific T abnormalities, lateral leads Confirmed by Virgina Norfolk [phone]) on 12/10/2019 8:52:47 AM          Virgina Norfolk, DO 12/10/19 1426

## 2019-12-11 ENCOUNTER — Ambulatory Visit (HOSPITAL_COMMUNITY): Payer: Medicaid Other

## 2019-12-11 DIAGNOSIS — N12 Tubulo-interstitial nephritis, not specified as acute or chronic: Secondary | ICD-10-CM

## 2019-12-11 DIAGNOSIS — K5903 Drug induced constipation: Secondary | ICD-10-CM

## 2019-12-11 LAB — CBC WITH DIFFERENTIAL/PLATELET
Abs Immature Granulocytes: 0.07 10*3/uL (ref 0.00–0.07)
Basophils Absolute: 0 10*3/uL (ref 0.0–0.1)
Basophils Relative: 0 %
Eosinophils Absolute: 0 10*3/uL (ref 0.0–0.5)
Eosinophils Relative: 0 %
HCT: 35.3 % — ABNORMAL LOW (ref 36.0–46.0)
Hemoglobin: 10.9 g/dL — ABNORMAL LOW (ref 12.0–15.0)
Immature Granulocytes: 1 %
Lymphocytes Relative: 17 %
Lymphs Abs: 2.6 10*3/uL (ref 0.7–4.0)
MCH: 25.9 pg — ABNORMAL LOW (ref 26.0–34.0)
MCHC: 30.9 g/dL (ref 30.0–36.0)
MCV: 83.8 fL (ref 80.0–100.0)
Monocytes Absolute: 1.9 10*3/uL — ABNORMAL HIGH (ref 0.1–1.0)
Monocytes Relative: 13 %
Neutro Abs: 10.1 10*3/uL — ABNORMAL HIGH (ref 1.7–7.7)
Neutrophils Relative %: 69 %
Platelets: 317 10*3/uL (ref 150–400)
RBC: 4.21 MIL/uL (ref 3.87–5.11)
RDW: 14.6 % (ref 11.5–15.5)
WBC: 14.7 10*3/uL — ABNORMAL HIGH (ref 4.0–10.5)
nRBC: 0 % (ref 0.0–0.2)

## 2019-12-11 LAB — BASIC METABOLIC PANEL
Anion gap: 10 (ref 5–15)
BUN: 22 mg/dL (ref 8–23)
CO2: 29 mmol/L (ref 22–32)
Calcium: 8.5 mg/dL — ABNORMAL LOW (ref 8.9–10.3)
Chloride: 103 mmol/L (ref 98–111)
Creatinine, Ser: 1.02 mg/dL — ABNORMAL HIGH (ref 0.44–1.00)
GFR calc Af Amer: >60 mL/min (ref >60)
GFR calc non Af Amer: 58 mL/min — ABNORMAL LOW (ref >60)
Glucose, Bld: 163 mg/dL — ABNORMAL HIGH (ref 70–99)
Potassium: 3.4 mmol/L — ABNORMAL LOW (ref 3.5–5.1)
Sodium: 142 mmol/L (ref 135–145)

## 2019-12-11 LAB — GLUCOSE, CAPILLARY
Glucose-Capillary: 180 mg/dL — ABNORMAL HIGH (ref 70–99)
Glucose-Capillary: 195 mg/dL — ABNORMAL HIGH (ref 70–99)
Glucose-Capillary: 166 mg/dL — ABNORMAL HIGH (ref 70–99)
Glucose-Capillary: 120 mg/dL — ABNORMAL HIGH (ref 70–99)

## 2019-12-11 MED ORDER — POTASSIUM CHLORIDE CRYS ER 20 MEQ PO TBCR
40.0000 meq | EXTENDED_RELEASE_TABLET | Freq: Two times a day (BID) | ORAL | Status: AC
  Administered 2019-12-11 (×2): 40 meq via ORAL
  Filled 2019-12-11 (×2): qty 2

## 2019-12-11 MED ORDER — POLYETHYLENE GLYCOL 3350 17 G PO PACK
17.0000 g | PACK | Freq: Two times a day (BID) | ORAL | Status: DC
  Administered 2019-12-11 – 2019-12-13 (×4): 17 g via ORAL
  Filled 2019-12-11 (×5): qty 1

## 2019-12-11 MED ORDER — SORBITOL 70 % SOLN
960.0000 mL | TOPICAL_OIL | Freq: Once | ORAL | Status: DC
  Filled 2019-12-11: qty 473

## 2019-12-11 NOTE — Progress Notes (Signed)
[facility] PROGRESS NOTE    Progress Note  Whitney Sloan  ZOX:[id number] DOB: 08/30/1955 DOA: 12/09/2019 PCP: Carmel Sacramento, NP     Brief Narrative:   Whitney Sloan is an 64 y.o. female past medical history of essential hypertension obesity CVA with residual ataxia slurred speech chronic diastolic heart failure recently hospitalized and discharged on 11/15/2019 for acute diastolic heart failure comes in as she has been taking MiraLAX Dulcolax and Ex-Lax with prune juice for about 2 weeks with no bowel movement comes in with abdominal pain she denies any shortness of breath, with positive lower extremity edema.  Through her abdominal pain CT scan was done which was unremarkable became hypoxic in the ED chest x-ray unremarkable D-dimer was 6  Assessment/Plan:   Acute respiratory failure with hypoxia (HCC) CTA negative for PE but apparently left lower lobe infiltrate. She was started empirically on IV Rocephin and azithromycin low suspicion for infectious etiology. DVT. Her BMI is 40 so question of obstructive sleep apnea. She has remained afebrile with no leukocytosis. Out of bed to chair, start incentive spirometry and flutter valve. Consult physical therapy. Wean off oxygen.  Constipation: Continue MiraLAX p.o. twice daily.  Chronic diastolic heart failure: She appears euvolemic, but some lower extremity edema asymmetric. Continue current home regimen and her Lasix.  Uncontrolled type 2 diabetes mellitus with hyperglycemia, with long-term current use of insulin (HCC) A1c poorly controlled. Glucophage held, continue Lantus plus sliding scale.  Essential hypertension: Continue current regimen.  Hyperlipidemia: Continue current regimen.  Choledocholithiasis: Appears to be an incidental finding she is currently asymptomatic.  Possible UTI: She has some CVA tenderness on physical exam, UA appears to show signs of infection and she has some stranding along her  kidneys. Continue IV Rocephin discontinue IV azithromycin await urine cultures.    DVT prophylaxis: lovenox Family Communication:Daughter Status is: Inpatient  Remains inpatient appropriate because:Hemodynamically unstable   Dispo: The patient is from: Home              Anticipated d/c is to: Home              Anticipated d/c date is: 2 days              Patient currently is not medically stable to d/c.        Code Status:     Code Status Orders  (From admission, onward)         Start     Ordered   12/10/19 1811  Full code  Continuous        12/10/19 1812        Code Status History    Date Active Date Inactive Code Status Order ID Comments User Context   11/08/2019 1522 11/15/2019 2303 Full Code [id number]  Marinda Elk, MD ED   Advance Care Planning Activity        IV Access:    Peripheral IV   Procedures and diagnostic studies:   CT Angio Chest PE W and/or Wo Contrast  Result Date: 12/10/2019 CLINICAL DATA:  Chest pain. Shortness of breath. History of CHF. Positive D-dimer. EXAM: CT ANGIOGRAPHY CHEST WITH CONTRAST TECHNIQUE: Multidetector CT imaging of the chest was performed using the standard protocol during bolus administration of intravenous contrast. Multiplanar CT image reconstructions and MIPs were obtained to evaluate the vascular anatomy. CONTRAST:  64mL OMNIPAQUE IOHEXOL 350 MG/ML SOLN COMPARISON:  None. FINDINGS: Cardiovascular: There is no evidence for an acute pulmonary embolism. The main pulmonary artery  is dilated measuring approximately 3.3 cm. There is cardiomegaly with coronary artery disease. There is a trace pericardial effusion. There are atherosclerotic changes of the thoracic aorta without evidence for an aneurysm or dissection. Mediastinum/Nodes: --there are enlarged mediastinal lymph nodes. For example there is a right paratracheal lymph node measuring up to approximately 1.2 cm (axial series 8, image 43). --there are mildly  enlarged mediastinal lymph nodes, right greater than left. -- No axillary lymphadenopathy. -- No supraclavicular lymphadenopathy. --there is asymmetric enlargement of the left thyroid gland with suggestion of a inferior left thyroid nodule measuring approximately 2.1 cm (axial series 8, image 25). -  Unremarkable esophagus. Lungs/Pleura: There is a mosaic appearance of the lung parenchyma bilaterally. There is segmental atelectasis in the left lower lobe. There is consolidation in the medial right lower lobe. There is no pneumothorax or significant pleural effusion. The trachea is unremarkable. Upper Abdomen: Contrast bolus timing is not optimized for evaluation of the abdominal organs. The visualized portions of the organs of the upper abdomen are normal. Musculoskeletal: No chest wall abnormality. No bony spinal canal stenosis. Review of the MIP images confirms the above findings. IMPRESSION: 1. There is no evidence for an acute pulmonary embolism. 2. There is consolidation in the medial right lower lobe, concerning for pneumonia. There is segmental atelectasis in the left lower lobe. 3. There is a mosaic appearance of the lung parenchyma bilaterally, which can be seen in patients with small airways disease. 4. Cardiomegaly with coronary artery disease. 5. Dilated main pulmonary artery which can be seen in patients with elevated pulmonary artery pressures. 6. Enlarged mediastinal lymph nodes, likely reactive. 7. Asymmetric enlargement of the left thyroid gland with suggestion of a inferior left thyroid nodule measuring approximately 2.1 cm. Recommend outpatient thyroid ultrasound for further evaluation.(Ref: J Am Coll Radiol. 2015 Feb;12(2): 143-50). Aortic Atherosclerosis (ICD10-I70.0). Electronically Signed   By: Katherine Mantle M.D.   On: 12/10/2019 16:58   CT ABDOMEN PELVIS W CONTRAST  Result Date: 12/10/2019 CLINICAL DATA:  Constipation EXAM: CT ABDOMEN AND PELVIS WITH CONTRAST TECHNIQUE: Multidetector  CT imaging of the abdomen and pelvis was performed using the standard protocol following bolus administration of intravenous contrast. CONTRAST:  OMNIPAQUE IOHEXOL 300 MG/ML  SOLN COMPARISON:  None. FINDINGS: Lower chest: Mild subpleural patchy opacities in the lung bases, likely dependent atelectasis. Hepatobiliary: Liver is within normal limits. Layering gallstones in the gallbladder fundus (series 4/image 37), without associated inflammatory changes. No intrahepatic or extrahepatic ductal dilatation. Pancreas: Within normal limits. Spleen: Within normal limits. Adrenals/Urinary Tract: Adrenal glands are within normal limits. 14 mm cyst in the posterior right upper kidney (series 4/image 36). Additional 9 mm probable cyst in the anterior left upper kidney. Mildly heterogeneous enhancement of the bilateral kidneys with mild urothelial enhancement of the bilateral proximal collecting system/ureters, left greater than right (series 4/image 46). Mild nonspecific perinephric/periureteral stranding. No hydronephrosis. Bladder is within normal limits. Stomach/Bowel: Stomach is within normal limits. No evidence of bowel obstruction. Normal appendix (series 4/image 62). Mild left colonic diverticulosis, without evidence of diverticulitis. Normal colonic stool burden. Vascular/Lymphatic: No evidence of abdominal aortic aneurysm. Atherosclerotic calcifications of the abdominal aorta and branch vessels. No suspicious abdominopelvic lymphadenopathy. Reproductive: Heterogeneous appearance of the uterus, suggesting uterine fibroids. Right ovary is within normal limits.  No left adnexal mass. Other: No abdominopelvic ascites. Musculoskeletal: Mild degenerative changes of the visualized thoracolumbar spine. IMPRESSION: No evidence of bowel obstruction. Normal appendix. Mild left colonic diverticulosis, without evidence of diverticulitis. Normal colonic stool  burden. Mild urothelial enhancement involving the bilateral renal  collecting systems with nonspecific periureteral stranding, raising concern for infection. Cholelithiasis, without associated inflammatory changes. Electronically Signed   By: Charline Bills M.D.   On: 12/10/2019 12:01   DG Chest Portable 1 View  Result Date: 12/10/2019 CLINICAL DATA:  Hypoxia. EXAM: PORTABLE CHEST 1 VIEW COMPARISON:  Single-view of the chest 11/11/2019. FINDINGS: The patient is rotated to the left increasing density over the left chest. The lungs are clear. Heart size is normal. No pneumothorax or pleural effusion. Atherosclerosis is noted. No acute or focal bony abnormality. IMPRESSION: No acute disease. Aortic Atherosclerosis (ICD10-I70.0). Electronically Signed   By: Drusilla Kanner M.D.   On: 12/10/2019 13:36   VAS [place] LOWER EXTREMITY VENOUS (DVT)  Result Date: 12/10/2019  Lower Venous DVTStudy Indications: Swelling, and Edema.  Limitations: Body habitus. Performing Technologist: Levada Schilling RDMS, RVT  Examination Guidelines: A complete evaluation includes B-mode imaging, spectral Doppler, color Doppler, and power Doppler as needed of all accessible portions of each vessel. Bilateral testing is considered an integral part of a complete examination. Limited examinations for reoccurring indications may be performed as noted. The reflux portion of the exam is performed with the patient in reverse Trendelenburg.  +---------+---------------+---------+-----------+----------+------------------+ RIGHT    CompressibilityPhasicitySpontaneityPropertiesThrombus Aging     +---------+---------------+---------+-----------+----------+------------------+ CFV      Full           Yes      Yes                                     +---------+---------------+---------+-----------+----------+------------------+ SFJ      Full                                                            +---------+---------------+---------+-----------+----------+------------------+ FV Prox  Full                                                             +---------+---------------+---------+-----------+----------+------------------+ FV Mid   Full                                                            +---------+---------------+---------+-----------+----------+------------------+ FV DistalFull                                                            +---------+---------------+---------+-----------+----------+------------------+ PFV      Full                                                            +---------+---------------+---------+-----------+----------+------------------+  POP      Full           Yes      Yes                                     +---------+---------------+---------+-----------+----------+------------------+ PTV      Full                                         limited evaluation +---------+---------------+---------+-----------+----------+------------------+ PERO     Full                                         limited evaluation +---------+---------------+---------+-----------+----------+------------------+ GSV      Full                                                            +---------+---------------+---------+-----------+----------+------------------+   +----+---------------+---------+-----------+----------+--------------+ LEFTCompressibilityPhasicitySpontaneityPropertiesThrombus Aging +----+---------------+---------+-----------+----------+--------------+ CFV Full           Yes      Yes                                 +----+---------------+---------+-----------+----------+--------------+     Summary: RIGHT: - There is no evidence of deep vein thrombosis in the lower extremity. However, portions of this examination were limited- see technologist comments above.  - No cystic structure found in the popliteal fossa.  LEFT: - No evidence of common femoral vein obstruction.  *See table(s) above for measurements and  observations. Electronically signed by Waverly Ferrari MD on 12/10/2019 at 7:39:30 PM.    Final      Medical Consultants:    None.  Anti-Infectives:   Rocephin IV  Subjective:    Whitney Sloan continues to have abdominal pain, she relates her breathing is stable.  Objective:    Vitals:   12/10/19 1545 12/10/19 1630 12/10/19 2113 12/11/19 0741  BP: 129/72 131/76 (!) 142/90 135/87  Pulse: (!) 121 (!) 116 (!) 121 (!) 114  Resp: 10 (!) 26 (!) 31 19  Temp:    98.1 F (36.7 C)  TempSrc:      SpO2: 92% 92% 96% 92%  Weight:      Height:       SpO2: 92 % O2 Flow Rate (L/min): 2 L/min   Intake/Output Summary (Last 24 hours) at 12/11/2019 1309 Last data filed at 12/11/2019 0800 Gross per 24 hour  Intake 240 ml  Output 200 ml  Net 40 ml   Filed Weights   12/09/19 1901  Weight: 108.9 kg    Exam: General exam: In no acute distress. Respiratory system: Good air movement and clear to auscultation. Cardiovascular system: S1 & S2 heard, RRR. No JVD. Gastrointestinal system: Abdomen is nondistended, soft and nontender.  Extremities: No pedal edema. Skin: No rashes, lesions or ulcers Psychiatry: Judgement and insight appear normal. Mood & affect appropriate.    Data Reviewed:    Labs: Basic Metabolic Panel: Recent Labs  Lab 12/06/19 1524 12/06/19  1524 12/09/19 2044 12/11/19 0230  NA 140  --  139 142  K 3.9   < > 2.9* 3.4*  CL 98  --  98 103  CO2 27  --  27 29  GLUCOSE 195*  --  277* 163*  BUN 17  --  20 22  CREATININE 0.72  --  1.06* 1.02*  CALCIUM 9.6  --  8.8* 8.5*  MG  --   --  1.7  --    < > = values in this interval not displayed.   GFR Estimated Creatinine Clearance: 68.4 mL/min (A) (by C-G formula based on SCr of 1.02 mg/dL (H)). Liver Function Tests: Recent Labs  Lab 12/09/19 2044  AST 16  ALT 13  ALKPHOS 66  BILITOT 0.5  PROT 7.6  ALBUMIN 3.0*   No results for input(s): LIPASE, AMYLASE in the last 168 hours. No results for  input(s): AMMONIA in the last 168 hours. Coagulation profile Recent Labs  Lab 12/10/19 1623  INR 1.3*   COVID-19 Labs  Recent Labs    12/10/19 1330  DDIMER 6.86*    Lab Results  Component Value Date   SARSCOV2NAA NEGATIVE 12/10/2019   SARSCOV2NAA NEGATIVE 11/08/2019   SARSCOV2NAA Not Detected 12/12/2018    CBC: Recent Labs  Lab 12/09/19 2044 12/11/19 0230  WBC 11.3* 14.7*  NEUTROABS  --  10.1*  HGB 11.0* 10.9*  HCT 36.2 35.3*  MCV 83.4 83.8  PLT 316 317   Cardiac Enzymes: No results for input(s): CKTOTAL, CKMB, CKMBINDEX, TROPONINI in the last 168 hours. BNP (last 3 results) No results for input(s): PROBNP in the last 8760 hours. CBG: Recent Labs  Lab 12/10/19 2219 12/11/19 0741 12/11/19 1206  GLUCAP 232* 180* 195*   D-Dimer: Recent Labs    12/10/19 1330  DDIMER 6.86*   Hgb A1c: No results for input(s): HGBA1C in the last 72 hours. Lipid Profile: No results for input(s): CHOL, HDL, LDLCALC, TRIG, CHOLHDL, LDLDIRECT in the last 72 hours. Thyroid function studies: No results for input(s): TSH, T4TOTAL, T3FREE, THYROIDAB in the last 72 hours.  Invalid input(s): FREET3 Anemia work up: No results for input(s): VITAMINB12, FOLATE, FERRITIN, TIBC, IRON, RETICCTPCT in the last 72 hours. Sepsis Labs: Recent Labs  Lab 12/09/19 2044 12/10/19 1330 12/10/19 1623 12/11/19 0230  WBC 11.3*  --   --  14.7*  LATICACIDVEN  --  1.5 1.3  --    Microbiology Recent Results (from the past 240 hour(s))  Urine culture     Status: Abnormal (Preliminary result)   Collection Time: 12/10/19 11:22 AM   Specimen: Urine, Random  Result Value Ref Range Status   Specimen Description URINE, RANDOM  Final   Special Requests   Final    NONE Performed at [facility], [address] [place], [place] [address]    Culture >=100,000 COLONIES/mL GRAM NEGATIVE RODS (A)  Final   Report Status PENDING  Incomplete  SARS Coronavirus 2 by RT PCR (hospital order, performed  in [facility] hospital lab) Nasopharyngeal Nasopharyngeal Swab     Status: None   Collection Time: 12/10/19  1:09 PM   Specimen: Nasopharyngeal Swab  Result Value Ref Range Status   SARS Coronavirus 2 NEGATIVE NEGATIVE Final    Comment: (NOTE) SARS-CoV-2 target nucleic acids are NOT DETECTED.  The SARS-CoV-2 RNA is generally detectable in upper and lower respiratory specimens during the acute phase of infection. The lowest concentration of SARS-CoV-2 viral copies this assay can detect is  250 copies / mL. A negative result does not preclude SARS-CoV-2 infection and should not be used as the sole basis for treatment or other patient management decisions.  A negative result may occur with improper specimen collection / handling, submission of specimen other than nasopharyngeal swab, presence of viral mutation(s) within the areas targeted by this assay, and inadequate number of viral copies (<250 copies / mL). A negative result must be combined with clinical observations, patient history, and epidemiological information.  Fact Sheet for Patients:   BoilerBrush.com.cy  Fact Sheet for Healthcare Providers: https://pope.com/  This test is not yet approved or  cleared by the [place] FDA and has been authorized for detection and/or diagnosis of SARS-CoV-2 by FDA under an Emergency Use Authorization (EUA).  This EUA will remain in effect (meaning this test can be used) for the duration of the COVID-19 declaration under Section 564(b)(1) of the Act, 21 U.S.C. section 360bbb-3(b)(1), unless the authorization is terminated or revoked sooner.  Performed at [facility], [address] [place], [place] [address]   Blood Culture (routine x 2)     Status: None (Preliminary result)   Collection Time: 12/10/19  4:23 PM   Specimen: BLOOD LEFT ARM  Result Value Ref Range Status   Specimen Description BLOOD LEFT ARM  Final   Special  Requests   Final    BOTTLES DRAWN AEROBIC AND ANAEROBIC Blood Culture adequate volume   Culture   Final    NO GROWTH < 24 HOURS Performed at [facility], [address] [place], [place] [address]    Report Status PENDING  Incomplete  Blood Culture (routine x 2)     Status: None (Preliminary result)   Collection Time: 12/10/19  4:24 PM   Specimen: BLOOD RIGHT ARM  Result Value Ref Range Status   Specimen Description BLOOD RIGHT ARM  Final   Special Requests   Final    BOTTLES DRAWN AEROBIC AND ANAEROBIC Blood Culture adequate volume   Culture   Final    NO GROWTH < 24 HOURS Performed at [facility], [address], [place], [place] [address]    Report Status PENDING  Incomplete     Medications:   . amLODipine  10 mg Oral Daily  . aspirin EC  81 mg Oral [id number]  . atorvastatin  20 mg Oral QHS  . docusate sodium  100 mg Oral BID  . enoxaparin (LOVENOX) injection  40 mg Subcutaneous Q24H  . furosemide  80 mg Oral Daily  . insulin aspart  0-15 Units Subcutaneous TID WC  . insulin aspart  0-5 Units Subcutaneous QHS  . insulin glargine  40 Units Subcutaneous Daily  . lisinopril  40 mg Oral Daily  . polyethylene glycol  17 g Oral Daily  . sodium chloride flush  3 mL Intravenous Once  . tamsulosin  0.4 mg Oral Daily   Continuous Infusions: . azithromycin Stopped (12/10/19 2101)  . cefTRIAXone (ROCEPHIN)  IV        LOS: 1 day   Marinda Elk  [facility]  12/11/2019, 1:09 PM

## 2019-12-12 ENCOUNTER — Ambulatory Visit (HOSPITAL_COMMUNITY): Payer: Medicaid Other

## 2019-12-12 DIAGNOSIS — I10 Essential (primary) hypertension: Secondary | ICD-10-CM

## 2019-12-12 DIAGNOSIS — E1165 Type 2 diabetes mellitus with hyperglycemia: Secondary | ICD-10-CM

## 2019-12-12 DIAGNOSIS — J9601 Acute respiratory failure with hypoxia: Secondary | ICD-10-CM

## 2019-12-12 DIAGNOSIS — A419 Sepsis, unspecified organism: Principal | ICD-10-CM

## 2019-12-12 DIAGNOSIS — Z794 Long term (current) use of insulin: Secondary | ICD-10-CM

## 2019-12-12 DIAGNOSIS — E876 Hypokalemia: Secondary | ICD-10-CM

## 2019-12-12 DIAGNOSIS — K8020 Calculus of gallbladder without cholecystitis without obstruction: Secondary | ICD-10-CM

## 2019-12-12 LAB — BASIC METABOLIC PANEL
Anion gap: 10 (ref 5–15)
BUN: 24 mg/dL — ABNORMAL HIGH (ref 8–23)
CO2: 27 mmol/L (ref 22–32)
Calcium: 8.6 mg/dL — ABNORMAL LOW (ref 8.9–10.3)
Chloride: 101 mmol/L (ref 98–111)
Creatinine, Ser: 1.13 mg/dL — ABNORMAL HIGH (ref 0.44–1.00)
GFR calc Af Amer: 59 mL/min — ABNORMAL LOW (ref >60)
GFR calc non Af Amer: 51 mL/min — ABNORMAL LOW (ref >60)
Glucose, Bld: 192 mg/dL — ABNORMAL HIGH (ref 70–99)
Potassium: 3.5 mmol/L (ref 3.5–5.1)
Sodium: 138 mmol/L (ref 135–145)

## 2019-12-12 LAB — URINE CULTURE: Culture: >=100000 — AB

## 2019-12-12 LAB — GLUCOSE, CAPILLARY
Glucose-Capillary: 144 mg/dL — ABNORMAL HIGH (ref 70–99)
Glucose-Capillary: 170 mg/dL — ABNORMAL HIGH (ref 70–99)
Glucose-Capillary: 161 mg/dL — ABNORMAL HIGH (ref 70–99)
Glucose-Capillary: 162 mg/dL — ABNORMAL HIGH (ref 70–99)

## 2019-12-12 MED ORDER — METOPROLOL TARTRATE 5 MG/5ML IV SOLN
5.0000 mg | Freq: Four times a day (QID) | INTRAVENOUS | Status: DC | PRN
  Administered 2019-12-12: 5 mg via INTRAVENOUS
  Filled 2019-12-12: qty 5

## 2019-12-12 MED ORDER — POTASSIUM CHLORIDE CRYS ER 20 MEQ PO TBCR
20.0000 meq | EXTENDED_RELEASE_TABLET | Freq: Two times a day (BID) | ORAL | Status: DC
  Administered 2019-12-12 – 2019-12-13 (×3): 20 meq via ORAL
  Filled 2019-12-12 (×3): qty 1

## 2019-12-12 MED ORDER — AZITHROMYCIN 250 MG PO TABS
500.0000 mg | ORAL_TABLET | Freq: Every day | ORAL | Status: DC
  Administered 2019-12-12 – 2019-12-13 (×2): 500 mg via ORAL
  Filled 2019-12-12 (×2): qty 2

## 2019-12-12 NOTE — Progress Notes (Addendum)
[facility] PROGRESS NOTE    Progress Note  Whitney Sloan  UJW:[id number] DOB: 1955-07-09 DOA: 12/09/2019 PCP: Carmel Sacramento, NP     Brief Narrative:   Whitney Sloan is an 64 y.o. female past medical history of essential hypertension obesity CVA with residual ataxia slurred speech chronic diastolic heart failure recently hospitalized and discharged on 11/15/2019 for acute diastolic heart failure comes in as she has been taking MiraLAX Dulcolax and Ex-Lax with prune juice for about 2 weeks with no bowel movement comes in with abdominal pain she denies any shortness of breath, with positive lower extremity edema.  Through her abdominal pain CT scan was done which was unremarkable became hypoxic in the ED chest x-ray unremarkable D-dimer was 6  Assessment/Plan:   Acute respiratory failure with hypoxia due to community-acquired pneumonia/sepsis: Lower extremity Doppler negative for DVT. She was started on admission on empiric IV Rocephin and azithromycin will continue as she is having new fevers. Her BMI is 40 so question of obstructive sleep apnea. Wean her to room air.  Constipation: Resolved Multiple bowel movements continue  MiraLAX p.o. twice daily.  Chronic diastolic heart failure: She appears euvolemic, or lower extremity asymmetric edema has resolved Continue current home regimen and her Lasix.  Uncontrolled type 2 diabetes mellitus with hyperglycemia, with long-term current use of insulin (HCC) A1c poorly controlled. Glucophage held, continue Lantus plus sliding scale.  Essential hypertension: Continue current regimen.  Hyperlipidemia: Continue current regimen.  Choledocholithiasis: Appears to be an incidental finding she is currently asymptomatic.  Possible UTI: Urine culture will be inconclusive Rocephin should cover.   DVT prophylaxis: lovenox Family Communication:Daughter Status is: Inpatient  Remains inpatient appropriate because:Hemodynamically  unstable   Dispo: The patient is from: Home              Anticipated d/c is to: Home              Anticipated d/c date is: 2 days              Patient currently is not medically stable to d/c.   Code Status:     Code Status Orders  (From admission, onward)         Start     Ordered   12/10/19 1811  Full code  Continuous        12/10/19 1812        Code Status History    Date Active Date Inactive Code Status Order ID Comments User Context   11/08/2019 1522 11/15/2019 2303 Full Code [id number]  Marinda Elk, MD ED   Advance Care Planning Activity        IV Access:    Peripheral IV   Procedures and diagnostic studies:   CT Angio Chest PE W and/or Wo Contrast  Result Date: 12/10/2019 CLINICAL DATA:  Chest pain. Shortness of breath. History of CHF. Positive D-dimer. EXAM: CT ANGIOGRAPHY CHEST WITH CONTRAST TECHNIQUE: Multidetector CT imaging of the chest was performed using the standard protocol during bolus administration of intravenous contrast. Multiplanar CT image reconstructions and MIPs were obtained to evaluate the vascular anatomy. CONTRAST:  64mL OMNIPAQUE IOHEXOL 350 MG/ML SOLN COMPARISON:  None. FINDINGS: Cardiovascular: There is no evidence for an acute pulmonary embolism. The main pulmonary artery is dilated measuring approximately 3.3 cm. There is cardiomegaly with coronary artery disease. There is a trace pericardial effusion. There are atherosclerotic changes of the thoracic aorta without evidence for an aneurysm or dissection. Mediastinum/Nodes: --there are enlarged mediastinal lymph  nodes. For example there is a right paratracheal lymph node measuring up to approximately 1.2 cm (axial series 8, image 43). --there are mildly enlarged mediastinal lymph nodes, right greater than left. -- No axillary lymphadenopathy. -- No supraclavicular lymphadenopathy. --there is asymmetric enlargement of the left thyroid gland with suggestion of a inferior left thyroid nodule  measuring approximately 2.1 cm (axial series 8, image 25). -  Unremarkable esophagus. Lungs/Pleura: There is a mosaic appearance of the lung parenchyma bilaterally. There is segmental atelectasis in the left lower lobe. There is consolidation in the medial right lower lobe. There is no pneumothorax or significant pleural effusion. The trachea is unremarkable. Upper Abdomen: Contrast bolus timing is not optimized for evaluation of the abdominal organs. The visualized portions of the organs of the upper abdomen are normal. Musculoskeletal: No chest wall abnormality. No bony spinal canal stenosis. Review of the MIP images confirms the above findings. IMPRESSION: 1. There is no evidence for an acute pulmonary embolism. 2. There is consolidation in the medial right lower lobe, concerning for pneumonia. There is segmental atelectasis in the left lower lobe. 3. There is a mosaic appearance of the lung parenchyma bilaterally, which can be seen in patients with small airways disease. 4. Cardiomegaly with coronary artery disease. 5. Dilated main pulmonary artery which can be seen in patients with elevated pulmonary artery pressures. 6. Enlarged mediastinal lymph nodes, likely reactive. 7. Asymmetric enlargement of the left thyroid gland with suggestion of a inferior left thyroid nodule measuring approximately 2.1 cm. Recommend outpatient thyroid ultrasound for further evaluation.(Ref: J Am Coll Radiol. 2015 Feb;12(2): 143-50). Aortic Atherosclerosis (ICD10-I70.0). Electronically Signed   By: Katherine Mantle M.D.   On: 12/10/2019 16:58   CT ABDOMEN PELVIS W CONTRAST  Result Date: 12/10/2019 CLINICAL DATA:  Constipation EXAM: CT ABDOMEN AND PELVIS WITH CONTRAST TECHNIQUE: Multidetector CT imaging of the abdomen and pelvis was performed using the standard protocol following bolus administration of intravenous contrast. CONTRAST:  OMNIPAQUE IOHEXOL 300 MG/ML  SOLN COMPARISON:  None. FINDINGS: Lower chest: Mild  subpleural patchy opacities in the lung bases, likely dependent atelectasis. Hepatobiliary: Liver is within normal limits. Layering gallstones in the gallbladder fundus (series 4/image 37), without associated inflammatory changes. No intrahepatic or extrahepatic ductal dilatation. Pancreas: Within normal limits. Spleen: Within normal limits. Adrenals/Urinary Tract: Adrenal glands are within normal limits. 14 mm cyst in the posterior right upper kidney (series 4/image 36). Additional 9 mm probable cyst in the anterior left upper kidney. Mildly heterogeneous enhancement of the bilateral kidneys with mild urothelial enhancement of the bilateral proximal collecting system/ureters, left greater than right (series 4/image 46). Mild nonspecific perinephric/periureteral stranding. No hydronephrosis. Bladder is within normal limits. Stomach/Bowel: Stomach is within normal limits. No evidence of bowel obstruction. Normal appendix (series 4/image 62). Mild left colonic diverticulosis, without evidence of diverticulitis. Normal colonic stool burden. Vascular/Lymphatic: No evidence of abdominal aortic aneurysm. Atherosclerotic calcifications of the abdominal aorta and branch vessels. No suspicious abdominopelvic lymphadenopathy. Reproductive: Heterogeneous appearance of the uterus, suggesting uterine fibroids. Right ovary is within normal limits.  No left adnexal mass. Other: No abdominopelvic ascites. Musculoskeletal: Mild degenerative changes of the visualized thoracolumbar spine. IMPRESSION: No evidence of bowel obstruction. Normal appendix. Mild left colonic diverticulosis, without evidence of diverticulitis. Normal colonic stool burden. Mild urothelial enhancement involving the bilateral renal collecting systems with nonspecific periureteral stranding, raising concern for infection. Cholelithiasis, without associated inflammatory changes. Electronically Signed   By: Charline Bills M.D.   On: 12/10/2019 12:01   DG  Chest  Portable 1 View  Result Date: 12/10/2019 CLINICAL DATA:  Hypoxia. EXAM: PORTABLE CHEST 1 VIEW COMPARISON:  Single-view of the chest 11/11/2019. FINDINGS: The patient is rotated to the left increasing density over the left chest. The lungs are clear. Heart size is normal. No pneumothorax or pleural effusion. Atherosclerosis is noted. No acute or focal bony abnormality. IMPRESSION: No acute disease. Aortic Atherosclerosis (ICD10-I70.0). Electronically Signed   By: Drusilla Kanner M.D.   On: 12/10/2019 13:36   VAS [place] LOWER EXTREMITY VENOUS (DVT)  Result Date: 12/10/2019  Lower Venous DVTStudy Indications: Swelling, and Edema.  Limitations: Body habitus. Performing Technologist: Levada Schilling RDMS, RVT  Examination Guidelines: A complete evaluation includes B-mode imaging, spectral Doppler, color Doppler, and power Doppler as needed of all accessible portions of each vessel. Bilateral testing is considered an integral part of a complete examination. Limited examinations for reoccurring indications may be performed as noted. The reflux portion of the exam is performed with the patient in reverse Trendelenburg.  +---------+---------------+---------+-----------+----------+------------------+ RIGHT    CompressibilityPhasicitySpontaneityPropertiesThrombus Aging     +---------+---------------+---------+-----------+----------+------------------+ CFV      Full           Yes      Yes                                     +---------+---------------+---------+-----------+----------+------------------+ SFJ      Full                                                            +---------+---------------+---------+-----------+----------+------------------+ FV Prox  Full                                                            +---------+---------------+---------+-----------+----------+------------------+ FV Mid   Full                                                             +---------+---------------+---------+-----------+----------+------------------+ FV DistalFull                                                            +---------+---------------+---------+-----------+----------+------------------+ PFV      Full                                                            +---------+---------------+---------+-----------+----------+------------------+ POP      Full           Yes      Yes                                     +---------+---------------+---------+-----------+----------+------------------+  PTV      Full                                         limited evaluation +---------+---------------+---------+-----------+----------+------------------+ PERO     Full                                         limited evaluation +---------+---------------+---------+-----------+----------+------------------+ GSV      Full                                                            +---------+---------------+---------+-----------+----------+------------------+   +----+---------------+---------+-----------+----------+--------------+ LEFTCompressibilityPhasicitySpontaneityPropertiesThrombus Aging +----+---------------+---------+-----------+----------+--------------+ CFV Full           Yes      Yes                                 +----+---------------+---------+-----------+----------+--------------+     Summary: RIGHT: - There is no evidence of deep vein thrombosis in the lower extremity. However, portions of this examination were limited- see technologist comments above.  - No cystic structure found in the popliteal fossa.  LEFT: - No evidence of common femoral vein obstruction.  *See table(s) above for measurements and observations. Electronically signed by Waverly Ferrari MD on 12/10/2019 at 7:39:30 PM.    Final      Medical Consultants:    None.  Anti-Infectives:   Rocephin IV  Subjective:    Whitney Sloan relates  her breathing is improved abdominal pain resolved, she has had 3 bowel movements.  Objective:    Vitals:   12/11/19 1602 12/11/19 2101 12/12/19 0902 12/12/19 0902  BP: 127/74 127/85 138/72 138/72  Pulse: (!) 111 (!) 104  (!) 113  Resp: 19   18  Temp: 97.6 F (36.4 C) 99.2 F (37.3 C)  (!) 100.6 F (38.1 C)  TempSrc:  Oral    SpO2: 98% 96%  97%  Weight:      Height:       SpO2: 97 % O2 Flow Rate (L/min): 2 L/min   Intake/Output Summary (Last 24 hours) at 12/12/2019 1048 Last data filed at 12/11/2019 2300 Gross per 24 hour  Intake 240 ml  Output --  Net 240 ml   Filed Weights   12/09/19 1901  Weight: 108.9 kg    Exam: General exam: In no acute distress. Respiratory system: Good air movement and c crackles at bases. Cardiovascular system: S1 & S2 heard, RRR. No JVD. Gastrointestinal system: Abdomen is nondistended, soft and nontender.  Extremities: No pedal edema. Skin: No rashes, lesions or ulcers Psychiatry: Judgement and insight appear normal. Mood & affect appropriate.  Data Reviewed:    Labs: Basic Metabolic Panel: Recent Labs  Lab 12/06/19 1524 12/06/19 1524 12/09/19 2044 12/11/19 0230  NA 140  --  139 142  K 3.9   < > 2.9* 3.4*  CL 98  --  98 103  CO2 27  --  27 29  GLUCOSE 195*  --  277* 163*  BUN 17  --  20 22  CREATININE 0.72  --  1.06* 1.02*  CALCIUM 9.6  --  8.8* 8.5*  MG  --   --  1.7  --    < > = values in this interval not displayed.   GFR Estimated Creatinine Clearance: 68.4 mL/min (A) (by C-G formula based on SCr of 1.02 mg/dL (H)). Liver Function Tests: Recent Labs  Lab 12/09/19 2044  AST 16  ALT 13  ALKPHOS 66  BILITOT 0.5  PROT 7.6  ALBUMIN 3.0*   No results for input(s): LIPASE, AMYLASE in the last 168 hours. No results for input(s): AMMONIA in the last 168 hours. Coagulation profile Recent Labs  Lab 12/10/19 1623  INR 1.3*   COVID-19 Labs  Recent Labs    12/10/19 1330  DDIMER 6.86*    Lab Results   Component Value Date   SARSCOV2NAA NEGATIVE 12/10/2019   SARSCOV2NAA NEGATIVE 11/08/2019   SARSCOV2NAA Not Detected 12/12/2018    CBC: Recent Labs  Lab 12/09/19 2044 12/11/19 0230  WBC 11.3* 14.7*  NEUTROABS  --  10.1*  HGB 11.0* 10.9*  HCT 36.2 35.3*  MCV 83.4 83.8  PLT 316 317   Cardiac Enzymes: No results for input(s): CKTOTAL, CKMB, CKMBINDEX, TROPONINI in the last 168 hours. BNP (last 3 results) No results for input(s): PROBNP in the last 8760 hours. CBG: Recent Labs  Lab 12/11/19 0741 12/11/19 1206 12/11/19 1601 12/11/19 2142 12/12/19 0903  GLUCAP 180* 195* 166* 120* 144*   D-Dimer: Recent Labs    12/10/19 1330  DDIMER 6.86*   Hgb A1c: No results for input(s): HGBA1C in the last 72 hours. Lipid Profile: No results for input(s): CHOL, HDL, LDLCALC, TRIG, CHOLHDL, LDLDIRECT in the last 72 hours. Thyroid function studies: No results for input(s): TSH, T4TOTAL, T3FREE, THYROIDAB in the last 72 hours.  Invalid input(s): FREET3 Anemia work up: No results for input(s): VITAMINB12, FOLATE, FERRITIN, TIBC, IRON, RETICCTPCT in the last 72 hours. Sepsis Labs: Recent Labs  Lab 12/09/19 2044 12/10/19 1330 12/10/19 1623 12/11/19 0230  WBC 11.3*  --   --  14.7*  LATICACIDVEN  --  1.5 1.3  --    Microbiology Recent Results (from the past 240 hour(s))  Urine culture     Status: Abnormal   Collection Time: 12/10/19 11:22 AM   Specimen: Urine, Random  Result Value Ref Range Status   Specimen Description URINE, RANDOM  Final   Special Requests   Final    NONE Performed at [facility], [address], [place], [place] [address]    Culture >=100,000 COLONIES/mL KLEBSIELLA PNEUMONIAE (A)  Final   Report Status 12/12/2019 FINAL  Final   Organism ID, Bacteria KLEBSIELLA PNEUMONIAE (A)  Final      Susceptibility   Klebsiella pneumoniae - MIC*    AMPICILLIN >=32 RESISTANT Resistant     CEFAZOLIN <=4 SENSITIVE Sensitive     CEFTRIAXONE <=0.25 SENSITIVE  Sensitive     CIPROFLOXACIN <=0.25 SENSITIVE Sensitive     GENTAMICIN <=1 SENSITIVE Sensitive     IMIPENEM <=0.25 SENSITIVE Sensitive     NITROFURANTOIN 32 SENSITIVE Sensitive     TRIMETH/SULFA <=20 SENSITIVE Sensitive     AMPICILLIN/SULBACTAM 8 SENSITIVE Sensitive     PIP/TAZO <=4 SENSITIVE Sensitive     * >=100,000 COLONIES/mL KLEBSIELLA PNEUMONIAE  SARS Coronavirus 2 by RT PCR (hospital order, performed in [facility] hospital lab) Nasopharyngeal Nasopharyngeal Swab     Status: None   Collection Time: 12/10/19  1:09 PM   Specimen: Nasopharyngeal Swab  Result Value  Ref Range Status   SARS Coronavirus 2 NEGATIVE NEGATIVE Final    Comment: (NOTE) SARS-CoV-2 target nucleic acids are NOT DETECTED.  The SARS-CoV-2 RNA is generally detectable in upper and lower respiratory specimens during the acute phase of infection. The lowest concentration of SARS-CoV-2 viral copies this assay can detect is 250 copies / mL. A negative result does not preclude SARS-CoV-2 infection and should not be used as the sole basis for treatment or other patient management decisions.  A negative result may occur with improper specimen collection / handling, submission of specimen other than nasopharyngeal swab, presence of viral mutation(s) within the areas targeted by this assay, and inadequate number of viral copies (<250 copies / mL). A negative result must be combined with clinical observations, patient history, and epidemiological information.  Fact Sheet for Patients:   BoilerBrush.com.cy  Fact Sheet for Healthcare Providers: https://pope.com/  This test is not yet approved or  cleared by the [place] FDA and has been authorized for detection and/or diagnosis of SARS-CoV-2 by FDA under an Emergency Use Authorization (EUA).  This EUA will remain in effect (meaning this test can be used) for the duration of the COVID-19 declaration under Section  564(b)(1) of the Act, 21 U.S.C. section 360bbb-3(b)(1), unless the authorization is terminated or revoked sooner.  Performed at [facility], [address] [place], [place] [address]   Blood Culture (routine x 2)     Status: None (Preliminary result)   Collection Time: 12/10/19  4:23 PM   Specimen: BLOOD LEFT ARM  Result Value Ref Range Status   Specimen Description BLOOD LEFT ARM  Final   Special Requests   Final    BOTTLES DRAWN AEROBIC AND ANAEROBIC Blood Culture adequate volume   Culture   Final    NO GROWTH 2 DAYS Performed at [facility], [address], [place], [place] [address]    Report Status PENDING  Incomplete  Blood Culture (routine x 2)     Status: None (Preliminary result)   Collection Time: 12/10/19  4:24 PM   Specimen: BLOOD RIGHT ARM  Result Value Ref Range Status   Specimen Description BLOOD RIGHT ARM  Final   Special Requests   Final    BOTTLES DRAWN AEROBIC AND ANAEROBIC Blood Culture adequate volume   Culture   Final    NO GROWTH 2 DAYS Performed at [facility], [address] [place], [place] [address]    Report Status PENDING  Incomplete     Medications:   . amLODipine  10 mg Oral Daily  . aspirin EC  81 mg Oral [id number]  . atorvastatin  20 mg Oral QHS  . docusate sodium  100 mg Oral BID  . enoxaparin (LOVENOX) injection  40 mg Subcutaneous Q24H  . furosemide  80 mg Oral Daily  . insulin aspart  0-15 Units Subcutaneous TID WC  . insulin aspart  0-5 Units Subcutaneous QHS  . insulin glargine  40 Units Subcutaneous Daily  . lisinopril  40 mg Oral Daily  . polyethylene glycol  17 g Oral BID  . sodium chloride flush  3 mL Intravenous Once  . sorbitol, milk of mag, mineral oil, glycerin (SMOG) enema  960 mL Rectal Once  . tamsulosin  0.4 mg Oral Daily   Continuous Infusions: . cefTRIAXone (ROCEPHIN)  IV 1 g (12/11/19 1316)      LOS: 2 days   Marinda Elk  [facility]  12/12/2019, 10:48 AM

## 2019-12-12 NOTE — Evaluation (Signed)
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  She presented with abdominal pain and SHOB. Pt admitted with acute resp failure, PNE, and constipation.  Clinical Impression  Pt admitted with above diagnosis. Pt was able to transfer and ambulate with min A and min cues for safety.  She did have multiple LOB with quad cane requiring min A to recover.  At home pt with quad cane and rollator - advised use of rollator at this time.  Pt did have decrease on O2 sats with activity on RA.   Pt with family support, necessary DME, and motivated to improve.  Pt with good rehab potential. Pt currently with functional limitations due to the deficits listed below (see PT Problem List). Pt will benefit from skilled PT to increase their independence and safety with mobility to allow discharge to the venue listed below.    SATURATION QUALIFICATIONS: (This note is used to comply with regulatory documentation for home oxygen)  Patient Saturations on Room Air at Rest = 93%  Patient Saturations on Room Air while Ambulating = 86%  Patient Saturations on 1 Liters of oxygen while Ambulating = 93%  Please briefly explain why patient needs home oxygen: Pt requiring oxygen at this time to maintain safety with mobility.      Follow Up Recommendations Home health PT;Supervision - Intermittent    Equipment Recommendations  None recommended by PT    Recommendations for Other Services       Precautions / Restrictions Precautions Precautions: Fall      Mobility  Bed Mobility               General bed mobility comments: sitting EOB upon arrival with dtr present  Transfers Overall transfer level: Needs assistance Equipment used: Quad cane;Rolling walker (2  wheeled) Transfers: Sit to/from Stand Sit to Stand: Min guard         General transfer comment: sit to stand x 2  Ambulation/Gait Ambulation/Gait assistance: Min assist Gait Distance (Feet): 80 Feet (80'x2) Assistive device: Quad cane;Rolling walker (2 wheeled) Gait Pattern/deviations: Step-through pattern;Decreased stride length Gait velocity: decreased   General Gait Details: Ambulated first bout with quad cane but pt with 4 LOB requiring min A for steadying; Ambulated second bout with RW and pt had no LOB; educated on posture and recommended use of RW.  Stairs            Wheelchair Mobility    Modified Rankin (Stroke Patients Only)       Balance Overall balance assessment: Needs assistance Sitting-balance support: Feet supported;No upper extremity supported Sitting balance-Leahy Scale: Fair     Standing balance support: During functional activity;Single extremity supported;Bilateral upper extremity supported Standing balance-Leahy Scale: Poor Standing balance comment: Pt required use of RW or cane for standing balance                             Pertinent Vitals/Pain Pain Assessment: No/denies pain    Home Living Family/patient expects to be discharged to:: Private residence Living Arrangements: Children Available Help at Discharge: Family;Available PRN/intermittently Type of Home: Apartment Home Access: Level entry     Home Layout: One level Home Equipment: Gilmer Mor - quad;Walker - 4 wheels;Shower seat  Prior Function Level of Independence: Independent with assistive device(s)         Comments: uses quad cane at times; uses rollator to carry groceries from the car; daughter does the driving errands; assists with housework; reports can do all ADLs, does have difficutly with socks/shoes if swollen     Hand Dominance   Dominant Hand: Right    Extremity/Trunk Assessment   Upper Extremity Assessment Upper Extremity Assessment: LUE  deficits/detail;RUE deficits/detail RUE Deficits / Details: ROM grossly WFL; MMT shoulders 3/5, elbows, wrist , hand 4-/5 LUE Deficits / Details: ROM grossly WFL; MMT shoulders 3/5, elbows, wrist , hand 4-/5    Lower Extremity Assessment Lower Extremity Assessment: LLE deficits/detail;RLE deficits/detail RLE Deficits / Details: ROM WFL: MMT 4/5 LLE Deficits / Details: ROM WFL: MMT 4/5    Cervical / Trunk Assessment Cervical / Trunk Assessment: Normal  Communication   Communication: No difficulties  Cognition Arousal/Alertness: Awake/alert Behavior During Therapy: WFL for tasks assessed/performed Overall Cognitive Status: Within Functional Limits for tasks assessed                                 General Comments: a&ox4      General Comments General comments (skin integrity, edema, etc.): Pt on 1 LPM O2 at arrival with sats 96%. Placed on RA and sats 93% rest.  Ambulated on RA and sats down to 86% and required 30 seconds to recover.  Ambulated on 1 LPM O2 and sats maintained 93 %.  Notified RN of results.    Exercises     Assessment/Plan    PT Assessment Patient needs continued PT services  PT Problem List Decreased strength;Decreased activity tolerance;Decreased mobility;Decreased knowledge of use of DME;Cardiopulmonary status limiting activity;Obesity;Decreased balance       PT Treatment Interventions DME instruction;Gait training;Functional mobility training;Therapeutic activities;Therapeutic exercise;Patient/family education    PT Goals (Current goals can be found in the Care Plan section)  Acute Rehab PT Goals Patient Stated Goal: to return home with daughter PT Goal Formulation: With patient/family Time For Goal Achievement: 12/26/19 Potential to Achieve Goals: Good    Frequency Min 3X/week   Barriers to discharge        Co-evaluation               AM-PAC PT "6 Clicks" Mobility  Outcome Measure Help needed turning from your back to your  side while in a flat bed without using bedrails?: A Little Help needed moving from lying on your back to sitting on the side of a flat bed without using bedrails?: A Little Help needed moving to and from a bed to a chair (including a wheelchair)?: A Little Help needed standing up from a chair using your arms (e.g., wheelchair or bedside chair)?: A Little Help needed to walk in hospital room?: A Little Help needed climbing 3-5 steps with a railing? : A Little 6 Click Score: 18    End of Session Equipment Utilized During Treatment: Gait belt;Oxygen Activity Tolerance: Patient tolerated treatment well Patient left: with call bell/phone within reach;in bed;with family/visitor present Nurse Communication: Mobility status PT Visit Diagnosis: Muscle weakness (generalized) (M62.81);Difficulty in walking, not elsewhere classified (R26.2)    Time: 1133-1203 PT Time Calculation (min) (ACUTE ONLY): 30 min   Charges:   PT Evaluation $PT Eval Moderate Complexity: 1 Mod PT Treatments $Gait Training: 8-22 mins        Anise Salvo, PT Acute Rehab Services Pager [phone]  [facility] [phone]    Rayetta Humphrey 12/12/2019, 1:45 PM

## 2019-12-13 LAB — BASIC METABOLIC PANEL
Anion gap: 11 (ref 5–15)
BUN: 25 mg/dL — ABNORMAL HIGH (ref 8–23)
CO2: 29 mmol/L (ref 22–32)
Calcium: 8.8 mg/dL — ABNORMAL LOW (ref 8.9–10.3)
Chloride: 102 mmol/L (ref 98–111)
Creatinine, Ser: 1.09 mg/dL — ABNORMAL HIGH (ref 0.44–1.00)
GFR calc Af Amer: >60 mL/min (ref >60)
GFR calc non Af Amer: 54 mL/min — ABNORMAL LOW (ref >60)
Glucose, Bld: 143 mg/dL — ABNORMAL HIGH (ref 70–99)
Potassium: 3.3 mmol/L — ABNORMAL LOW (ref 3.5–5.1)
Sodium: 142 mmol/L (ref 135–145)

## 2019-12-13 LAB — GLUCOSE, CAPILLARY
Glucose-Capillary: 132 mg/dL — ABNORMAL HIGH (ref 70–99)
Glucose-Capillary: 140 mg/dL — ABNORMAL HIGH (ref 70–99)
Glucose-Capillary: 149 mg/dL — ABNORMAL HIGH (ref 70–99)

## 2019-12-13 MED ORDER — AZITHROMYCIN 250 MG PO TABS: ORAL_TABLET | ORAL | 0 refills | Status: DC

## 2019-12-13 MED ORDER — AMOXICILLIN-POT CLAVULANATE 875-125 MG PO TABS: 1.0000 | ORAL_TABLET | Freq: Two times a day (BID) | ORAL | 0 refills | Status: AC

## 2019-12-13 MED ORDER — POTASSIUM CHLORIDE CRYS ER 20 MEQ PO TBCR: 40.0000 meq | EXTENDED_RELEASE_TABLET | Freq: Two times a day (BID) | ORAL | Status: DC

## 2019-12-13 NOTE — Discharge Summary (Signed)
Physician Discharge Summary  Whitney Sloan:[id number] DOB: [birth year] DOA: 12/09/2019  PCP: Carmel Sacramento, NP  Admit date: 12/09/2019 Discharge date: 12/13/2019  Admitted From: Home Disposition:  Home  Recommendations for Outpatient Follow-up:  1. Follow up with PCP in 1-2 weeks 2. Please obtain BMP/CBC in one week   Home Health:No Equipment/Devices:None  Discharge Condition:Stable CODE STATUS:Full Diet recommendation: Heart Healthy   Brief/Interim Summary:  64 y.o. female past medical history of essential hypertension obesity CVA with residual ataxia slurred speech chronic diastolic heart failure recently hospitalized and discharged on 11/15/2019 for acute diastolic heart failure comes in as she has been taking MiraLAX Dulcolax and Ex-Lax with prune juice for about 2 weeks with no bowel movement comes in with abdominal pain she denies any shortness of breath, with positive lower extremity edema.  Through her abdominal pain CT scan was done which was unremarkable became hypoxic   Discharge Diagnoses:  Principal Problem:   Acute respiratory failure with hypoxia (HCC) Active Problems:   Uncontrolled type 2 diabetes mellitus with hyperglycemia, with long-term current use of insulin (HCC)   Dyslipidemia   Essential hypertension   Constipation   Cholelithiasis   Thyroid nodule   Obesity (BMI 30-39.9)  Acute respiratory failure with hypoxia due to community-acquired pneumonia/sepsis: Lower extremity Doppler was negative for DVT, she on admission she was started on IV Rocephin and azithromycin CT angio of the chest did not show a PE at that show a left lower lobe infiltrate. She does probably has obstructive sleep apnea which probably contributed to her hypoxia on admission. She was weaned to room air should continue empiric antibiotic for 2 additional days and outpatient.  Constipation: Resolved with MiraLAX.  Chronic diastolic heart failure: She appears euvolemic on physical  exam no change made to her medication.  Uncontrolled diabetes mellitus type 2 with hyperglycemia on long-term insulin use: She relates she has not been using her insulin regularly, her Metformin was held on admission she was covered in the hospital with long-acting insulin plus sliding scale she will resume her regular regimen as an outpatient follow-up with PCP and explained the importance of taking her insulin regularly and checking her CBGs as mandated.  Essential hypertension: Continue current regimen.  Choledocholithiasis: Asymptomatic no further evaluation.  Possible UTI: Urine culture was inconclusive, Rocephin should cover.  Discharge Instructions  Discharge Instructions    Diet - low sodium heart healthy   Complete by: As directed    Increase activity slowly   Complete by: As directed      Allergies as of 12/13/2019      Reactions   Oxycodone-acetaminophen Nausea And Vomiting      Medication List    STOP taking these medications   polycarbophil 625 MG tablet Commonly known as: FIBERCON   tamsulosin 0.4 MG Caps capsule Commonly known as: FLOMAX     TAKE these medications   amLODipine 10 MG tablet Commonly known as: NORVASC Take 1 tablet (10 mg total) by mouth daily.   amoxicillin-clavulanate 875-125 MG tablet Commonly known as: Augmentin Take 1 tablet by mouth 2 (two) times daily for 6 doses.   Aspir-Low 81 MG EC tablet Generic drug: aspirin Take 81 mg by mouth every morning.   atorvastatin 20 MG tablet Commonly known as: LIPITOR Take 1 tablet (20 mg total) by mouth at bedtime.   azithromycin 250 MG tablet Commonly known as: ZITHROMAX Take 1 tablet daily Start taking on: December 14, 2019   bisacodyl 10 MG suppository Commonly known  as: DULCOLAX Place 10 mg rectally daily as needed for moderate constipation.   Ex-Lax 15 MG Tabs Generic drug: Sennosides Take 15 mg by mouth daily as needed (constipation).   ezetimibe 10 MG tablet Commonly known as:  ZETIA Take 10 mg by mouth at bedtime.   furosemide 80 MG tablet Commonly known as: LASIX Take 1 tablet (80 mg total) by mouth daily.   ibuprofen 200 MG tablet Commonly known as: ADVIL Take 200 mg by mouth every 6 (six) hours as needed for fever, headache or moderate pain.   Lantus SoloStar 100 UNIT/ML Solostar Pen Generic drug: insulin glargine Inject 40 Units into the skin daily.   lisinopril 40 MG tablet Commonly known as: ZESTRIL Take 1 tablet (40 mg total) by mouth daily.   metFORMIN 500 MG tablet Commonly known as: GLUCOPHAGE Take 1,000 mg by mouth 2 (two) times daily.   NovoLOG FlexPen 100 UNIT/ML FlexPen Generic drug: insulin aspart Inject 16 Units into the skin 3 (three) times daily with meals.   polyethylene glycol 17 g packet Commonly known as: MIRALAX / GLYCOLAX Take 17 g by mouth daily.   Unifine Pentips 31G X 6 MM Misc Generic drug: Insulin Pen Needle USE TO INJECT INSULIN   VITAMIN D PO Take 1 capsule by mouth daily.       Allergies  Allergen Reactions  . Oxycodone-Acetaminophen Nausea And Vomiting    Consultations:  None   Procedures/Studies: CT Angio Chest PE W and/or Wo Contrast  Result Date: 12/10/2019 CLINICAL DATA:  Chest pain. Shortness of breath. History of CHF. Positive D-dimer. EXAM: CT ANGIOGRAPHY CHEST WITH CONTRAST TECHNIQUE: Multidetector CT imaging of the chest was performed using the standard protocol during bolus administration of intravenous contrast. Multiplanar CT image reconstructions and MIPs were obtained to evaluate the vascular anatomy. CONTRAST:  35mL OMNIPAQUE IOHEXOL 350 MG/ML SOLN COMPARISON:  None. FINDINGS: Cardiovascular: There is no evidence for an acute pulmonary embolism. The main pulmonary artery is dilated measuring approximately 3.3 cm. There is cardiomegaly with coronary artery disease. There is a trace pericardial effusion. There are atherosclerotic changes of the thoracic aorta without evidence for an  aneurysm or dissection. Mediastinum/Nodes: --there are enlarged mediastinal lymph nodes. For example there is a right paratracheal lymph node measuring up to approximately 1.2 cm (axial series 8, image 43). --there are mildly enlarged mediastinal lymph nodes, right greater than left. -- No axillary lymphadenopathy. -- No supraclavicular lymphadenopathy. --there is asymmetric enlargement of the left thyroid gland with suggestion of a inferior left thyroid nodule measuring approximately 2.1 cm (axial series 8, image 25). -  Unremarkable esophagus. Lungs/Pleura: There is a mosaic appearance of the lung parenchyma bilaterally. There is segmental atelectasis in the left lower lobe. There is consolidation in the medial right lower lobe. There is no pneumothorax or significant pleural effusion. The trachea is unremarkable. Upper Abdomen: Contrast bolus timing is not optimized for evaluation of the abdominal organs. The visualized portions of the organs of the upper abdomen are normal. Musculoskeletal: No chest wall abnormality. No bony spinal canal stenosis. Review of the MIP images confirms the above findings. IMPRESSION: 1. There is no evidence for an acute pulmonary embolism. 2. There is consolidation in the medial right lower lobe, concerning for pneumonia. There is segmental atelectasis in the left lower lobe. 3. There is a mosaic appearance of the lung parenchyma bilaterally, which can be seen in patients with small airways disease. 4. Cardiomegaly with coronary artery disease. 5. Dilated main pulmonary artery which can  be seen in patients with elevated pulmonary artery pressures. 6. Enlarged mediastinal lymph nodes, likely reactive. 7. Asymmetric enlargement of the left thyroid gland with suggestion of a inferior left thyroid nodule measuring approximately 2.1 cm. Recommend outpatient thyroid ultrasound for further evaluation.(Ref: J Am Coll Radiol. 2015 Feb;12(2): 143-50). Aortic Atherosclerosis (ICD10-I70.0).  Electronically Signed   By: Katherine Mantle M.D.   On: 12/10/2019 16:58   CT ABDOMEN PELVIS W CONTRAST  Result Date: 12/10/2019 CLINICAL DATA:  Constipation EXAM: CT ABDOMEN AND PELVIS WITH CONTRAST TECHNIQUE: Multidetector CT imaging of the abdomen and pelvis was performed using the standard protocol following bolus administration of intravenous contrast. CONTRAST:  OMNIPAQUE IOHEXOL 300 MG/ML  SOLN COMPARISON:  None. FINDINGS: Lower chest: Mild subpleural patchy opacities in the lung bases, likely dependent atelectasis. Hepatobiliary: Liver is within normal limits. Layering gallstones in the gallbladder fundus (series 4/image 37), without associated inflammatory changes. No intrahepatic or extrahepatic ductal dilatation. Pancreas: Within normal limits. Spleen: Within normal limits. Adrenals/Urinary Tract: Adrenal glands are within normal limits. 14 mm cyst in the posterior right upper kidney (series 4/image 36). Additional 9 mm probable cyst in the anterior left upper kidney. Mildly heterogeneous enhancement of the bilateral kidneys with mild urothelial enhancement of the bilateral proximal collecting system/ureters, left greater than right (series 4/image 46). Mild nonspecific perinephric/periureteral stranding. No hydronephrosis. Bladder is within normal limits. Stomach/Bowel: Stomach is within normal limits. No evidence of bowel obstruction. Normal appendix (series 4/image 62). Mild left colonic diverticulosis, without evidence of diverticulitis. Normal colonic stool burden. Vascular/Lymphatic: No evidence of abdominal aortic aneurysm. Atherosclerotic calcifications of the abdominal aorta and branch vessels. No suspicious abdominopelvic lymphadenopathy. Reproductive: Heterogeneous appearance of the uterus, suggesting uterine fibroids. Right ovary is within normal limits.  No left adnexal mass. Other: No abdominopelvic ascites. Musculoskeletal: Mild degenerative changes of the visualized  thoracolumbar spine. IMPRESSION: No evidence of bowel obstruction. Normal appendix. Mild left colonic diverticulosis, without evidence of diverticulitis. Normal colonic stool burden. Mild urothelial enhancement involving the bilateral renal collecting systems with nonspecific periureteral stranding, raising concern for infection. Cholelithiasis, without associated inflammatory changes. Electronically Signed   By: Charline Bills M.D.   On: 12/10/2019 12:01   DG Chest Portable 1 View  Result Date: 12/10/2019 CLINICAL DATA:  Hypoxia. EXAM: PORTABLE CHEST 1 VIEW COMPARISON:  Single-view of the chest 11/11/2019. FINDINGS: The patient is rotated to the left increasing density over the left chest. The lungs are clear. Heart size is normal. No pneumothorax or pleural effusion. Atherosclerosis is noted. No acute or focal bony abnormality. IMPRESSION: No acute disease. Aortic Atherosclerosis (ICD10-I70.0). Electronically Signed   By: Drusilla Kanner M.D.   On: 12/10/2019 13:36   VAS [place] LOWER EXTREMITY VENOUS (DVT)  Result Date: 12/10/2019  Lower Venous DVTStudy Indications: Swelling, and Edema.  Limitations: Body habitus. Performing Technologist: Levada Schilling RDMS, RVT  Examination Guidelines: A complete evaluation includes B-mode imaging, spectral Doppler, color Doppler, and power Doppler as needed of all accessible portions of each vessel. Bilateral testing is considered an integral part of a complete examination. Limited examinations for reoccurring indications may be performed as noted. The reflux portion of the exam is performed with the patient in reverse Trendelenburg.  +---------+---------------+---------+-----------+----------+------------------+ RIGHT    CompressibilityPhasicitySpontaneityPropertiesThrombus Aging     +---------+---------------+---------+-----------+----------+------------------+ CFV      Full           Yes      Yes                                      +---------+---------------+---------+-----------+----------+------------------+  SFJ      Full                                                            +---------+---------------+---------+-----------+----------+------------------+ FV Prox  Full                                                            +---------+---------------+---------+-----------+----------+------------------+ FV Mid   Full                                                            +---------+---------------+---------+-----------+----------+------------------+ FV DistalFull                                                            +---------+---------------+---------+-----------+----------+------------------+ PFV      Full                                                            +---------+---------------+---------+-----------+----------+------------------+ POP      Full           Yes      Yes                                     +---------+---------------+---------+-----------+----------+------------------+ PTV      Full                                         limited evaluation +---------+---------------+---------+-----------+----------+------------------+ PERO     Full                                         limited evaluation +---------+---------------+---------+-----------+----------+------------------+ GSV      Full                                                            +---------+---------------+---------+-----------+----------+------------------+   +----+---------------+---------+-----------+----------+--------------+ LEFTCompressibilityPhasicitySpontaneityPropertiesThrombus Aging +----+---------------+---------+-----------+----------+--------------+ CFV Full           Yes      Yes                                 +----+---------------+---------+-----------+----------+--------------+  Summary: RIGHT: - There is no evidence of deep vein thrombosis in  the lower extremity. However, portions of this examination were limited- see technologist comments above.  - No cystic structure found in the popliteal fossa.  LEFT: - No evidence of common femoral vein obstruction.  *See table(s) above for measurements and observations. Electronically signed by Waverly Ferrari MD on 12/10/2019 at 7:39:30 PM.    Final      Subjective: No complains  Discharge Exam: Vitals:   12/13/19 0022 12/13/19 0806  BP: (!) 126/97 (!) 142/84  Pulse: 79 (!) 101  Resp: 17 16  Temp: 97.7 F (36.5 C) 98.9 F (37.2 C)  SpO2: 100% 94%   Vitals:   12/12/19 1659 12/12/19 1748 12/13/19 0022 12/13/19 0806  BP: 138/73  (!) 126/97 (!) 142/84  Pulse: (!) 102  79 (!) 101  Resp: Temp: 97.8 F (36.6 C)  97.7 F (36.5 C) 98.9 F (37.2 C)  TempSrc:   Oral Oral  SpO2: 99%  100% 94%  Weight:      Height:        General: Pt is alert, awake, not in acute distress Cardiovascular: RRR, S1/S2 +, no rubs, no gallops Respiratory: CTA bilaterally, no wheezing, no rhonchi Abdominal: Soft, NT, ND, bowel sounds + Extremities: no edema, no cyanosis    The results of significant diagnostics from this hospitalization (including imaging, microbiology, ancillary and laboratory) are listed below for reference.     Microbiology: Recent Results (from the past 240 hour(s))  Urine culture     Status: Abnormal   Collection Time: 12/10/19 11:22 AM   Specimen: Urine, Random  Result Value Ref Range Status   Specimen Description URINE, RANDOM  Final   Special Requests   Final    NONE Performed at [facility], [address] [place], [place] [address]    Culture >=100,000 COLONIES/mL KLEBSIELLA PNEUMONIAE (A)  Final   Report Status 12/12/2019 FINAL  Final   Organism ID, Bacteria KLEBSIELLA PNEUMONIAE (A)  Final      Susceptibility   Klebsiella pneumoniae - MIC*    AMPICILLIN >=32 RESISTANT Resistant     CEFAZOLIN <=4 SENSITIVE Sensitive     CEFTRIAXONE  <=0.25 SENSITIVE Sensitive     CIPROFLOXACIN <=0.25 SENSITIVE Sensitive     GENTAMICIN <=1 SENSITIVE Sensitive     IMIPENEM <=0.25 SENSITIVE Sensitive     NITROFURANTOIN 32 SENSITIVE Sensitive     TRIMETH/SULFA <=20 SENSITIVE Sensitive     AMPICILLIN/SULBACTAM 8 SENSITIVE Sensitive     PIP/TAZO <=4 SENSITIVE Sensitive     * >=100,000 COLONIES/mL KLEBSIELLA PNEUMONIAE  SARS Coronavirus 2 by RT PCR (hospital order, performed in [facility] hospital lab) Nasopharyngeal Nasopharyngeal Swab     Status: None   Collection Time: 12/10/19  1:09 PM   Specimen: Nasopharyngeal Swab  Result Value Ref Range Status   SARS Coronavirus 2 NEGATIVE NEGATIVE Final    Comment: (NOTE) SARS-CoV-2 target nucleic acids are NOT DETECTED.  The SARS-CoV-2 RNA is generally detectable in upper and lower respiratory specimens during the acute phase of infection. The lowest concentration of SARS-CoV-2 viral copies this assay can detect is 250 copies / mL. A negative result does not preclude SARS-CoV-2 infection and should not be used as the sole basis for treatment or other patient management decisions.  A negative result may occur with improper specimen collection / handling, submission of specimen other than nasopharyngeal swab, presence of viral mutation(s) within the  areas targeted by this assay, and inadequate number of viral copies (<250 copies / mL). A negative result must be combined with clinical observations, patient history, and epidemiological information.  Fact Sheet for Patients:   BoilerBrush.com.cy  Fact Sheet for Healthcare Providers: https://pope.com/  This test is not yet approved or  cleared by the [place] FDA and has been authorized for detection and/or diagnosis of SARS-CoV-2 by FDA under an Emergency Use Authorization (EUA).  This EUA will remain in effect (meaning this test can be used) for the duration of the COVID-19 declaration  under Section 564(b)(1) of the Act, 21 U.S.C. section 360bbb-3(b)(1), unless the authorization is terminated or revoked sooner.  Performed at [facility], [address] [place], [place] [address]   Blood Culture (routine x 2)     Status: None (Preliminary result)   Collection Time: 12/10/19  4:23 PM   Specimen: BLOOD LEFT ARM  Result Value Ref Range Status   Specimen Description BLOOD LEFT ARM  Final   Special Requests   Final    BOTTLES DRAWN AEROBIC AND ANAEROBIC Blood Culture adequate volume   Culture   Final    NO GROWTH 3 DAYS Performed at [facility], [address], [place], [place] [address]    Report Status PENDING  Incomplete  Blood Culture (routine x 2)     Status: None (Preliminary result)   Collection Time: 12/10/19  4:24 PM   Specimen: BLOOD RIGHT ARM  Result Value Ref Range Status   Specimen Description BLOOD RIGHT ARM  Final   Special Requests   Final    BOTTLES DRAWN AEROBIC AND ANAEROBIC Blood Culture adequate volume   Culture   Final    NO GROWTH 3 DAYS Performed at [facility], [address], [place], [place] [address]    Report Status PENDING  Incomplete     Labs: BNP (last 3 results) Recent Labs    11/08/19 0115 12/02/19 1545 12/10/19 1330  BNP 132.6* 93.7 266.9*   Basic Metabolic Panel: Recent Labs  Lab 12/06/19 1524 12/09/19 2044 12/11/19 0230 12/12/19 1103 12/13/19 0438  NA 140 139 142 138 142  K 3.9 2.9* 3.4* 3.5 3.3*  CL 98 98 103 101 102  CO2 27 27 29 27 29   GLUCOSE 195* 277* 163* 192* 143*  BUN 17 20 22  24* 25*  CREATININE 0.72 1.06* 1.02* 1.13* 1.09*  CALCIUM 9.6 8.8* 8.5* 8.6* 8.8*  MG  --  1.7  --   --   --    Liver Function Tests: Recent Labs  Lab 12/09/19 2044  AST 16  ALT 13  ALKPHOS 66  BILITOT 0.5  PROT 7.6  ALBUMIN 3.0*   No results for input(s): LIPASE, AMYLASE in the last 168 hours. No results for input(s): AMMONIA in the last 168 hours. CBC: Recent Labs  Lab 12/09/19 2044  12/11/19 0230  WBC 11.3* 14.7*  NEUTROABS  --  10.1*  HGB 11.0* 10.9*  HCT 36.2 35.3*  MCV 83.4 83.8  PLT 316 317   Cardiac Enzymes: No results for input(s): CKTOTAL, CKMB, CKMBINDEX, TROPONINI in the last 168 hours. BNP: Invalid input(s): POCBNP CBG: Recent Labs  Lab 12/12/19 1300 12/12/19 1653 12/12/19 2136 12/13/19 0614 12/13/19 0805  GLUCAP 170* 161* 162* 132* 140*   D-Dimer Recent Labs    12/10/19 1330  DDIMER 6.86*   Hgb A1c No results for input(s): HGBA1C in the last 72 hours. Lipid Profile No results  for input(s): CHOL, HDL, LDLCALC, TRIG, CHOLHDL, LDLDIRECT in the last 72 hours. Thyroid function studies No results for input(s): TSH, T4TOTAL, T3FREE, THYROIDAB in the last 72 hours.  Invalid input(s): FREET3 Anemia work up No results for input(s): VITAMINB12, FOLATE, FERRITIN, TIBC, IRON, RETICCTPCT in the last 72 hours. Urinalysis    Component Value Date/Time   COLORURINE AMBER (A) 12/10/2019 1122   APPEARANCEUR CLOUDY (A) 12/10/2019 1122   LABSPEC 1.015 12/10/2019 1122   PHURINE 5.0 12/10/2019 1122   GLUCOSEU NEGATIVE 12/10/2019 1122   HGBUR MODERATE (A) 12/10/2019 1122   BILIRUBINUR NEGATIVE 12/10/2019 1122   KETONESUR NEGATIVE 12/10/2019 1122   PROTEINUR >=300 (A) 12/10/2019 1122   UROBILINOGEN 0.2 10/01/2016 1648   NITRITE NEGATIVE 12/10/2019 1122   LEUKOCYTESUR LARGE (A) 12/10/2019 1122   Sepsis Labs Invalid input(s): PROCALCITONIN,  WBC,  LACTICIDVEN Microbiology Recent Results (from the past 240 hour(s))  Urine culture     Status: Abnormal   Collection Time: 12/10/19 11:22 AM   Specimen: Urine, Random  Result Value Ref Range Status   Specimen Description URINE, RANDOM  Final   Special Requests   Final    NONE Performed at [facility], [address], [place], [place] [address]    Culture >=100,000 COLONIES/mL KLEBSIELLA PNEUMONIAE (A)  Final   Report Status 12/12/2019 FINAL  Final   Organism ID, Bacteria KLEBSIELLA PNEUMONIAE  (A)  Final      Susceptibility   Klebsiella pneumoniae - MIC*    AMPICILLIN >=32 RESISTANT Resistant     CEFAZOLIN <=4 SENSITIVE Sensitive     CEFTRIAXONE <=0.25 SENSITIVE Sensitive     CIPROFLOXACIN <=0.25 SENSITIVE Sensitive     GENTAMICIN <=1 SENSITIVE Sensitive     IMIPENEM <=0.25 SENSITIVE Sensitive     NITROFURANTOIN 32 SENSITIVE Sensitive     TRIMETH/SULFA <=20 SENSITIVE Sensitive     AMPICILLIN/SULBACTAM 8 SENSITIVE Sensitive     PIP/TAZO <=4 SENSITIVE Sensitive     * >=100,000 COLONIES/mL KLEBSIELLA PNEUMONIAE  SARS Coronavirus 2 by RT PCR (hospital order, performed in [facility] hospital lab) Nasopharyngeal Nasopharyngeal Swab     Status: None   Collection Time: 12/10/19  1:09 PM   Specimen: Nasopharyngeal Swab  Result Value Ref Range Status   SARS Coronavirus 2 NEGATIVE NEGATIVE Final    Comment: (NOTE) SARS-CoV-2 target nucleic acids are NOT DETECTED.  The SARS-CoV-2 RNA is generally detectable in upper and lower respiratory specimens during the acute phase of infection. The lowest concentration of SARS-CoV-2 viral copies this assay can detect is 250 copies / mL. A negative result does not preclude SARS-CoV-2 infection and should not be used as the sole basis for treatment or other patient management decisions.  A negative result may occur with improper specimen collection / handling, submission of specimen other than nasopharyngeal swab, presence of viral mutation(s) within the areas targeted by this assay, and inadequate number of viral copies (<250 copies / mL). A negative result must be combined with clinical observations, patient history, and epidemiological information.  Fact Sheet for Patients:   BoilerBrush.com.cy  Fact Sheet for Healthcare Providers: https://pope.com/  This test is not yet approved or  cleared by the [place] FDA and has been authorized for detection and/or diagnosis of SARS-CoV-2  by FDA under an Emergency Use Authorization (EUA).  This EUA will remain in effect (meaning this test can be used) for the duration of the COVID-19 declaration under Section 564(b)(1) of the Act, 21 U.S.C. section 360bbb-3(b)(1), unless the  authorization is terminated or revoked sooner.  Performed at [facility], [address], [place], [place] [address]   Blood Culture (routine x 2)     Status: None (Preliminary result)   Collection Time: 12/10/19  4:23 PM   Specimen: BLOOD LEFT ARM  Result Value Ref Range Status   Specimen Description BLOOD LEFT ARM  Final   Special Requests   Final    BOTTLES DRAWN AEROBIC AND ANAEROBIC Blood Culture adequate volume   Culture   Final    NO GROWTH 3 DAYS Performed at [facility], [address] [place], [place] [address]    Report Status PENDING  Incomplete  Blood Culture (routine x 2)     Status: None (Preliminary result)   Collection Time: 12/10/19  4:24 PM   Specimen: BLOOD RIGHT ARM  Result Value Ref Range Status   Specimen Description BLOOD RIGHT ARM  Final   Special Requests   Final    BOTTLES DRAWN AEROBIC AND ANAEROBIC Blood Culture adequate volume   Culture   Final    NO GROWTH 3 DAYS Performed at [facility], [address] [place], [place] [address]    Report Status PENDING  Incomplete      SIGNED:   Marinda Elk, MD  [facility] 12/13/2019, 10:02 AM Pager   If 7PM-7AM, please contact night-coverage [email or web address] Password [email or web address]

## 2019-12-13 NOTE — Care Management Important Message (Signed)
Important Message  Patient Details  Name: Whitney Sloan MRN: [id number] Date of Birth: [birth year]   Medicare Important Message Given:  Yes     Dorena Bodo 12/13/2019, 12:28 PM

## 2019-12-15 LAB — CULTURE, BLOOD (ROUTINE X 2)
Culture: NO GROWTH
Culture: NO GROWTH
Special Requests: ADEQUATE
Special Requests: ADEQUATE

## 2019-12-19 ENCOUNTER — Ambulatory Visit (INDEPENDENT_AMBULATORY_CARE_PROVIDER_SITE_OTHER): Payer: Medicaid Other | Admitting: Cardiology

## 2019-12-19 ENCOUNTER — Other Ambulatory Visit: Payer: Self-pay

## 2019-12-19 ENCOUNTER — Encounter: Payer: Self-pay | Admitting: Cardiology

## 2019-12-19 ENCOUNTER — Telehealth: Payer: Self-pay | Admitting: *Deleted

## 2019-12-19 VITALS — BP 122/74 | HR 108 | Ht 65.0 in | Wt 231.0 lb

## 2019-12-19 DIAGNOSIS — I5033 Acute on chronic diastolic (congestive) heart failure: Secondary | ICD-10-CM

## 2019-12-19 DIAGNOSIS — Z794 Long term (current) use of insulin: Secondary | ICD-10-CM

## 2019-12-19 DIAGNOSIS — J189 Pneumonia, unspecified organism: Secondary | ICD-10-CM | POA: Diagnosis not present

## 2019-12-19 DIAGNOSIS — E669 Obesity, unspecified: Secondary | ICD-10-CM

## 2019-12-19 DIAGNOSIS — E785 Hyperlipidemia, unspecified: Secondary | ICD-10-CM

## 2019-12-19 DIAGNOSIS — E1142 Type 2 diabetes mellitus with diabetic polyneuropathy: Secondary | ICD-10-CM

## 2019-12-19 DIAGNOSIS — E119 Type 2 diabetes mellitus without complications: Secondary | ICD-10-CM

## 2019-12-19 DIAGNOSIS — I10 Essential (primary) hypertension: Secondary | ICD-10-CM

## 2019-12-19 DIAGNOSIS — R079 Chest pain, unspecified: Secondary | ICD-10-CM

## 2019-12-19 NOTE — Assessment & Plan Note (Signed)
Per PCP 

## 2019-12-19 NOTE — Progress Notes (Signed)
Cardiology Office Note:    Date:  12/19/2019   ID:  Whitney Sloan, DOB 1956/01/27, MRN [id number]  PCP:  Zachery Dauer, FNP  Cardiologist:  Little Ishikawa, MD  Electrophysiologist:  None   Referring MD: Carmel Sacramento, NP   CC:  Post hospital follow up  History of Present Illness:    Whitney Sloan is a pleasant 64 y.o. female who lives with her daughter with a hx of insulin-dependent diabetes, obesity, and hypertension.  She was admitted 11/08/2019 to 11/15/2019 with acute respiratory failure felt to be secondary to acute diastolic heart failure.  Echocardiogram revealed an ejection fraction of 50 to 55% with grade 2 diastolic dysfunction.  Her admission weight was around 240 pounds.  She diuresed 10 L.  At discharge her weight was closer to 230 pounds.  The patient was then admitted again 6/29 to 12/13/2019.  She presented with abdominal pain and then developed respiratory difficulties.  Chest x-ray showed left lower lobe pneumonia.  Was also noted that her weight on admission 12/10/2019 was 240 pounds, up 10 pounds from her discharge 2 weeks prior.  Patient was put on antibiotics.  Cardiology did not see her during that admission.  She presents to the office today for follow-up.  Since discharge she is done well.  Her weight is down to 225 pounds after she increased her diuretics for 3 days at home based on her weight..  She has been compliant with her medications.  She tells me her daughter is helping her with her low-sodium diet.  Past Medical History:  Diagnosis Date  . Chronic diastolic (congestive) heart failure (HCC)   . Diabetes mellitus   . Hyperlipemia   . Hypertension   . Psoriasis     Past Surgical History:  Procedure Laterality Date  . ANAL FISSURE REPAIR    . ENDOMETRIAL ABLATION      Current Medications: Current Meds  Medication Sig  . amLODipine (NORVASC) 10 MG tablet Take 1 tablet (10 mg total) by mouth daily.  Marland Kitchen aspirin (ASPIR-LOW) 81 MG EC tablet Take 81 mg  by mouth every morning.   Marland Kitchen atorvastatin (LIPITOR) 20 MG tablet Take 1 tablet (20 mg total) by mouth at bedtime.  Marland Kitchen azithromycin (ZITHROMAX) 250 MG tablet Take 1 tablet daily  . bisacodyl (DULCOLAX) 10 MG suppository Place 10 mg rectally daily as needed for moderate constipation.  . Cholecalciferol (VITAMIN D PO) Take 1 capsule by mouth daily.  Marland Kitchen ezetimibe (ZETIA) 10 MG tablet Take 10 mg by mouth at bedtime.  . furosemide (LASIX) 80 MG tablet Take 1 tablet (80 mg total) by mouth daily.  Marland Kitchen ibuprofen (ADVIL) 200 MG tablet Take 200 mg by mouth every 6 (six) hours as needed for fever, headache or moderate pain.  Marland Kitchen insulin aspart (NOVOLOG FLEXPEN) 100 UNIT/ML FlexPen Inject 16 Units into the skin 3 (three) times daily with meals.  . insulin glargine (LANTUS SOLOSTAR) 100 UNIT/ML Solostar Pen Inject 40 Units into the skin daily.  Marland Kitchen lisinopril (ZESTRIL) 40 MG tablet Take 1 tablet (40 mg total) by mouth daily.  . metFORMIN (GLUCOPHAGE) 500 MG tablet Take 1,000 mg by mouth 2 (two) times daily.   . polyethylene glycol (MIRALAX / GLYCOLAX) 17 g packet Take 17 g by mouth daily.  . Sennosides (EX-LAX) 15 MG TABS Take 15 mg by mouth daily as needed (constipation).  Marland Kitchen UNIFINE PENTIPS 31G X 6 MM MISC USE TO INJECT INSULIN     Allergies:  Oxycodone-acetaminophen   Social History   Socioeconomic History  . Marital status: Divorced    Spouse name: Not on file  . Number of children: Not on file  . Years of education: Not on file  . Highest education level: Not on file  Occupational History  . Occupation: disabled  Tobacco Use  . Smoking status: Never Smoker  . Smokeless tobacco: Never Used  Substance and Sexual Activity  . Alcohol use: No  . Drug use: No  . Sexual activity: Not on file  Other Topics Concern  . Not on file  Social History Narrative  . Not on file   Social Determinants of Health   Financial Resource Strain:   . Difficulty of Paying Living Expenses:   Food Insecurity:   .  Worried About Programme researcher, broadcasting/film/video in the Last Year:   . Barista in the Last Year:   Transportation Needs:   . Freight forwarder (Medical):   Marland Kitchen Lack of Transportation (Non-Medical):   Physical Activity:   . Days of Exercise per Week:   . Minutes of Exercise per Session:   Stress:   . Feeling of Stress :   Social Connections:   . Frequency of Communication with Friends and Family:   . Frequency of Social Gatherings with Friends and Family:   . Attends Religious Services:   . Active Member of Clubs or Organizations:   . Attends Banker Meetings:   Marland Kitchen Marital Status:      Family History: The patient's family history includes Dementia in her mother; Diabetes in an other family member; Renal Disease in her father.  ROS:   Please see the history of present illness.     All other systems reviewed and are negative.  EKGs/Labs/Other Studies Reviewed:    The following studies were reviewed today: Echo 11/08/2019- IMPRESSIONS    1. Left ventricular ejection fraction, by estimation, is 50 to 55%. The  left ventricle has low normal function. The left ventricle demonstrates  global hypokinesis. Left ventricular diastolic parameters are consistent  with Grade II diastolic dysfunction  (pseudonormalization). Elevated left ventricular end-diastolic pressure.  2. Right ventricular systolic function is normal. The right ventricular  size is normal.  3. The mitral valve is abnormal. Trivial mitral valve regurgitation.  4. The aortic valve is tricuspid. Aortic valve regurgitation is not  visualized. Mild aortic valve sclerosis is present, with no evidence of  aortic valve stenosis.  5. The inferior vena cava is dilated in size with >50% respiratory  variability, suggesting right atrial pressure of 8 mmHg.   EKG:  EKG is not ordered today.  The ekg ordered 12/11/2019 demonstrates NSR- ST 118  Recent Labs: 12/09/2019: ALT 13; Magnesium 1.7 12/10/2019: B  Natriuretic Peptide 266.9 12/11/2019: Hemoglobin 10.9; Platelets 317 12/13/2019: BUN 25; Creatinine, Ser 1.09; Potassium 3.3; Sodium 142  Recent Lipid Panel    Component Value Date/Time   CHOL 168 11/13/2019 0218   TRIG 97 11/13/2019 0218   HDL 48 11/13/2019 0218   CHOLHDL 3.5 11/13/2019 0218   VLDL 19 11/13/2019 0218   LDLCALC 101 (H) 11/13/2019 0218    Physical Exam:    VS:  BP 122/74   Pulse (!) 108   Ht 5\' 5"  (1.651 m)   Wt 231 lb (104.8 kg)   SpO2 98%   BMI 38.44 kg/m     Wt Readings from Last 3 Encounters:  12/19/19 231 lb (104.8 kg)  12/09/19 240 lb (  108.9 kg)  12/02/19 240 lb 3.2 oz (109 kg)     GEN: Obese well developed AA female in no acute distress HEENT: Normal NECK: No JVD; No carotid bruits LYMPHATICS: No lymphadenopathy CARDIAC: RRR, no murmurs, rubs, gallops RESPIRATORY:  Clear to auscultation without rales, wheezing or rhonchi  ABDOMEN: truncal obesity MUSCULOSKELETAL:  trace edema; No deformity  SKIN: Warm and dry NEUROLOGIC:  Alert and oriented x 3 PSYCHIATRIC:  Normal affect   ASSESSMENT:    Acute on chronic diastolic heart failure (HCC) Admitted in 5/28-6/09/2019 with [facility]- diuresed 10L  Essential hypertension EF 50-55% with grade 2 DD by echo 12/04/2019  Obesity (BMI 30-39.9) BMI 38- truncal obesity.  The patient tells me she had a negative sleep study in the past but I could not locate those results in EPIC  CAP (community acquired pneumonia) Admitted 6/29-7/07/2019 with abdominal pain then developed respiratory failure and had LLL infiltrate on CXR.  She was treated with ABs for CAP.  Admission weight was 240 lbs-up 10 lbs from her discharge weight 11/15/2019.  Chest pain of uncertain etiology Multiple cardiac risk factors- Myoview pending  Type 2 diabetes mellitus with diabetic polyneuropathy, with long-term current use of insulin (HCC) Per PCP  PLAN:    No change in current plan though we could consider adding Coreg now that her CHF  appears compensated.  She has a Myoview and f/u with Dr Bjorn Pippin scheduled for later this month.    Medication Adjustments/Labs and Tests Ordered: Current medicines are reviewed at length with the patient today.  Concerns regarding medicines are outlined above.  No orders of the defined types were placed in this encounter.  No orders of the defined types were placed in this encounter.   Patient Instructions  Medication Instructions:  Your physician recommends that you continue on your current medications as directed. Please refer to the Current Medication list given to you today.  *If you need a refill on your cardiac medications before your next appointment, please call your pharmacy*  Lab Work: NONE ordered at this time of appointment   If you have labs (blood work) drawn today and your tests are completely normal, you will receive your results only by: Marland Kitchen MyChart Message (if you have MyChart) OR . A paper copy in the mail If you have any lab test that is abnormal or we need to change your treatment, we will call you to review the results.  Testing/Procedures: NONE ordered at this time of appointment   Follow-Up: At [facility], you and your health needs are our priority.  As part of our continuing mission to provide you with exceptional heart care, we have created designated Provider Care Teams.  These Care Teams include your primary Cardiologist (physician) and Advanced Practice Providers (APPs -  Physician Assistants and Nurse Practitioners) who all work together to provide you with the care you need, when you need it.  We recommend signing up for the patient portal called "MyChart".  Sign up information is provided on this After Visit Summary.  MyChart is used to connect with patients for Virtual Visits (Telemedicine).  Patients are able to view lab/test results, encounter notes, upcoming appointments, etc.  Non-urgent messages can be sent to your provider as well.   To learn  more about what you can do with MyChart, go to [email or web address].    Your next appointment:   As Scheduled-01/02/2020 at 9:00 AM    The format for your next appointment:   In Person  Provider:   Epifanio Lesches, MD  Other Instructions      Signed, Corine Shelter, PA-C  12/19/2019 2:18 PM     [facility]

## 2019-12-19 NOTE — Patient Instructions (Addendum)
Medication Instructions:  Your physician recommends that you continue on your current medications as directed. Please refer to the Current Medication list given to you today.  *If you need a refill on your cardiac medications before your next appointment, please call your pharmacy*  Lab Work: NONE ordered at this time of appointment   If you have labs (blood work) drawn today and your tests are completely normal, you will receive your results only by: Marland Kitchen MyChart Message (if you have MyChart) OR . A paper copy in the mail If you have any lab test that is abnormal or we need to change your treatment, we will call you to review the results.  Testing/Procedures: NONE ordered at this time of appointment   Follow-Up: At [facility], you and your health needs are our priority.  As part of our continuing mission to provide you with exceptional heart care, we have created designated Provider Care Teams.  These Care Teams include your primary Cardiologist (physician) and Advanced Practice Providers (APPs -  Physician Assistants and Nurse Practitioners) who all work together to provide you with the care you need, when you need it.  We recommend signing up for the patient portal called "MyChart".  Sign up information is provided on this After Visit Summary.  MyChart is used to connect with patients for Virtual Visits (Telemedicine).  Patients are able to view lab/test results, encounter notes, upcoming appointments, etc.  Non-urgent messages can be sent to your provider as well.   To learn more about what you can do with MyChart, go to [email or web address].    Your next appointment:   As Scheduled-01/02/2020 at 9:00 AM    The format for your next appointment:   In Person  Provider:   Epifanio Lesches, MD  Other Instructions

## 2019-12-19 NOTE — Assessment & Plan Note (Signed)
Admitted 6/29-7/07/2019 with abdominal pain then developed respiratory failure and had LLL infiltrate on CXR.  She was treated with ABs for CAP.  Admission weight was 240 lbs-up 10 lbs from her discharge weight 11/15/2019.

## 2019-12-19 NOTE — Assessment & Plan Note (Signed)
Admitted in 5/28-6/09/2019 with [facility]- diuresed 10L

## 2019-12-19 NOTE — Assessment & Plan Note (Signed)
Multiple cardiac risk factors- Myoview pending

## 2019-12-19 NOTE — Assessment & Plan Note (Signed)
BMI 38- truncal obesity.  The patient tells me she had a negative sleep study in the past but I could not locate those results in [facility]

## 2019-12-19 NOTE — Telephone Encounter (Signed)
Attempted to contact pt to discuss transition of care; left message on voicemail.  Burnard Bunting, RN, BSN, CCRN [facility] Engagement [facility] [phone]

## 2019-12-19 NOTE — Assessment & Plan Note (Signed)
EF 50-55% with grade 2 DD by echo 12/04/2019

## 2019-12-20 NOTE — Telephone Encounter (Signed)
  Transition Care Management Follow-up Telephone Call  . Date of discharge and from where: 12/13/19 [facility]  . How have you been since you were released from the hospital? Getting back to self; had diarrhea and vomiting since discharge; no episodes today  . Any questions or concerns? Heart MD asking about PNA, but he does not see a trace of it; she would like to know if she wanted to verify that she indeed have PNA   Items Reviewed: Marland Kitchen Did the pt receive and understand the discharge instructions provided? No  . Medications obtained and verified? No  . Any new allergies since your discharge? No  . Dietary orders reviewed? yes . Do you have support at home? yes Functional Questionnaire: (I = Independent and D = Dependent)  ADLs: Independent Bathing/Dressing:Independent Meal Prep: Dependent Eating: Independent Maintaining continence: Dependent Transferring/Ambulation: Dependent Managing Meds: Dependent Follow up appointments reviewed:  PCP Hospital f/u appt confirmed? yes Scheduled to see Cari Caraway, NP on 12/20/19 @1200  Specialist Hospital f/u appt confirmed?  Scheduled to see Dr on 01/02/20@ 0900   Are transportation arrangements needed? No  If their condition worsens, is the pt aware to call PCP or go to the EmergencyDept.? No  Was the patient provided with contact information for the PCP's office or ED? Yes Was to pt encouraged to call back with questions or concerns? Yes Referral to ambulatory community care coordination due to chronic conditions  01/04/20, RN, BSN, CCRN Patient Engagement Center [phone]

## 2019-12-20 NOTE — Addendum Note (Signed)
Addended by: Redmond Baseman on: 12/20/2019 11:04 AM   Modules accepted: Orders

## 2019-12-22 ENCOUNTER — Ambulatory Visit (HOSPITAL_COMMUNITY)
Admission: EM | Admit: 2019-12-22 | Discharge: 2019-12-22 | Disposition: A | Discharge status: Home / Self Care | Payer: Medicaid Other | Attending: Emergency Medicine | Admitting: Emergency Medicine

## 2019-12-22 ENCOUNTER — Encounter (HOSPITAL_COMMUNITY): Payer: Self-pay

## 2019-12-22 ENCOUNTER — Other Ambulatory Visit: Payer: Self-pay

## 2019-12-22 DIAGNOSIS — M25561 Pain in right knee: Secondary | ICD-10-CM

## 2019-12-22 MED ORDER — DICLOFENAC SODIUM 50 MG PO TBEC: 50.0000 mg | DELAYED_RELEASE_TABLET | Freq: Two times a day (BID) | ORAL | 0 refills | Status: AC

## 2019-12-22 NOTE — ED Triage Notes (Signed)
Pt presents with recurrent right knee pain from arthritis since yesterday; pt states she has got very  mild relief with OTC meds and creams.

## 2019-12-22 NOTE — Discharge Instructions (Signed)
Diclofenac twice daily with food Ice and elevate knee Ace wrap for compression and swelling  Follow-up if symptoms not improving or worsening

## 2019-12-23 ENCOUNTER — Other Ambulatory Visit: Payer: Self-pay | Admitting: *Deleted

## 2019-12-23 NOTE — Patient Outreach (Signed)
[facility]) Care Management  12/23/2019  Whitney Sloan [birth year] [id number]   Referral received 7/9 Initial Outreach 7/12  Telephone Outreach unsuccessful  RN attempted outreach however unsuccessful. RN able to leave a HIPAA approved voice message requesting a call back.   Plan: Will follow up with another call over the next week for pending services.  [name], RN Care Management Coordinator [facility] Main Office [phone]

## 2019-12-24 NOTE — ED Provider Notes (Signed)
[facility]    CSN: [id number] Arrival date & time: 12/22/19  1647      History   Chief Complaint Chief Complaint  Patient presents with  . Knee Pain    HPI Whitney Sloan is a 64 y.o. female history of diastolic heart failure, DM type II, hypertension, hyperlipidemia presenting today for evaluation of new right knee pain.  Patient reports that beginning yesterday she began to have increased pain and swelling in her right knee.  Denies any injury or fall.  Reports that she has history of recurrent pain in this knee and has underlying arthritis.  She has been using topical medicines without relief.  Denies prior injections into this knee.  Has been ambulating with a cane for comfort.  Denies fevers.  HPI  Past Medical History:  Diagnosis Date  . Chronic diastolic (congestive) heart failure (HCC)   . Diabetes mellitus   . Hyperlipemia   . Hypertension   . Psoriasis     Patient Active Problem List   Diagnosis Date Noted  . Chest pain of uncertain etiology 12/19/2019  . CAP (community acquired pneumonia) 12/19/2019  . Constipation 12/10/2019  . Cholelithiasis 12/10/2019  . Thyroid nodule 12/10/2019  . Obesity (BMI 30-39.9) 12/10/2019  . Acute on chronic diastolic heart failure (HCC) 11/08/2019  . Acute respiratory failure with hypoxia (HCC) 11/08/2019  . Chronic diastolic CHF (congestive heart failure) (HCC) 11/08/2019  . Hypertensive crisis 11/08/2019  . Mixed diabetic hyperlipidemia associated with type 2 diabetes mellitus (HCC) 11/08/2019  . Type 2 diabetes mellitus with diabetic polyneuropathy, with long-term current use of insulin (HCC) 08/08/2019  . Dyslipidemia 03/23/2010  . DECREASED HEARING, BILATERAL 02/05/2010  . MEMORY LOSS 02/05/2010  . INCONTINENCE 02/05/2010  . NEOPLASM, MALIGNANT, BREAST, HX OF 01/13/2010  . ANXIETY STATE, UNSPECIFIED 12/02/2009  . UNSTEADY GAIT 12/02/2009  . TOE PAIN 09/24/2009  . ANAL OR RECTAL PAIN 08/25/2009  . LEG  EDEMA, BILATERAL 08/25/2009  . Uncontrolled type 2 diabetes mellitus with hyperglycemia, with long-term current use of insulin (HCC) 08/06/2009  . Essential hypertension 08/06/2009  . DERMATITIS, ATOPIC 08/06/2009    Past Surgical History:  Procedure Laterality Date  . ANAL FISSURE REPAIR    . ENDOMETRIAL ABLATION      OB History   No obstetric history on file.      Home Medications    Prior to Admission medications   Medication Sig Start Date End Date Taking? Authorizing Provider  amLODipine (NORVASC) 10 MG tablet Take 1 tablet (10 mg total) by mouth daily. 11/16/19   Burnadette Pop, MD  aspirin (ASPIR-LOW) 81 MG EC tablet Take 81 mg by mouth every morning.     [provider]  atorvastatin (LIPITOR) 20 MG tablet Take 1 tablet (20 mg total) by mouth at bedtime. 11/15/19   Burnadette Pop, MD  bisacodyl (DULCOLAX) 10 MG suppository Place 10 mg rectally daily as needed for moderate constipation.    [provider]  Cholecalciferol (VITAMIN D PO) Take 1 capsule by mouth daily.    [provider]  diclofenac (VOLTAREN) 50 MG EC tablet Take 1 tablet (50 mg total) by mouth 2 (two) times daily for 10 days. 12/22/19 01/01/20  Lewin Pellow C, PA-C  ezetimibe (ZETIA) 10 MG tablet Take 10 mg by mouth at bedtime. 08/21/19   [provider]  furosemide (LASIX) 80 MG tablet Take 1 tablet (80 mg total) by mouth daily. 12/05/19   Little Ishikawa, MD  ibuprofen (ADVIL) 200  MG tablet Take 200 mg by mouth every 6 (six) hours as needed for fever, headache or moderate pain.    [provider]  insulin aspart (NOVOLOG FLEXPEN) 100 UNIT/ML FlexPen Inject 16 Units into the skin 3 (three) times daily with meals. 08/07/19   Shamleffer, Konrad Dolores, MD  insulin glargine (LANTUS SOLOSTAR) 100 UNIT/ML Solostar Pen Inject 40 Units into the skin daily. 11/21/19   Shamleffer, Konrad Dolores, MD  lisinopril (ZESTRIL) 40 MG tablet Take 1 tablet (40 mg total) by  mouth daily. 11/15/19   Burnadette Pop, MD  metFORMIN (GLUCOPHAGE) 500 MG tablet Take 1,000 mg by mouth 2 (two) times daily.  10/21/19   [provider]  polyethylene glycol (MIRALAX / GLYCOLAX) 17 g packet Take 17 g by mouth daily.    [provider]  Sennosides (EX-LAX) 15 MG TABS Take 15 mg by mouth daily as needed (constipation).    [provider]  UNIFINE PENTIPS 31G X 6 MM MISC USE TO INJECT INSULIN 03/21/11   Edd Arbour, MD    Family History Family History  Problem Relation Age of Onset  . Dementia Mother   . Renal Disease Father   . Diabetes Other     Social History Social History   Tobacco Use  . Smoking status: Never Smoker  . Smokeless tobacco: Never Used  Substance Use Topics  . Alcohol use: No  . Drug use: No     Allergies   Oxycodone-acetaminophen   Review of Systems Review of Systems  Constitutional: Negative for fatigue and fever.  Eyes: Negative for visual disturbance.  Respiratory: Negative for shortness of breath.   Cardiovascular: Negative for chest pain.  Gastrointestinal: Negative for abdominal pain, nausea and vomiting.  Musculoskeletal: Positive for arthralgias, gait problem and joint swelling.  Skin: Negative for color change, rash and wound.  Neurological: Negative for dizziness, weakness, light-headedness and headaches.     Physical Exam Triage Vital Signs ED Triage Vitals  Enc Vitals Group     BP 12/22/19 1704 110/63     Pulse Rate 12/22/19 1704 (!) 110     Resp 12/22/19 1704 20     Temp 12/22/19 1704 98 F (36.7 C)     Temp Source 12/22/19 1704 Oral     SpO2 12/22/19 1704 96 %     Weight --      Height --      Head Circumference --      Peak Flow --      Pain Score 12/22/19 1702 7     Pain Loc --      Pain Edu? --      Excl. in GC? --    No data found.  Updated Vital Signs BP 110/63 (BP Location: Right Arm)   Pulse (!) 110   Temp 98 F (36.7 C) (Oral)   Resp 20   SpO2 96%   Visual  Acuity Right Eye Distance:   Left Eye Distance:   Bilateral Distance:    Right Eye Near:   Left Eye Near:    Bilateral Near:     Physical Exam Vitals and nursing note reviewed.  Constitutional:      Appearance: She is well-developed.     Comments: No acute distress  HENT:     Head: Normocephalic and atraumatic.     Nose: Nose normal.  Eyes:     Conjunctiva/sclera: Conjunctivae normal.  Cardiovascular:     Rate and Rhythm: Normal rate.  Pulmonary:  Effort: Pulmonary effort is normal. No respiratory distress.  Abdominal:     General: There is no distension.  Musculoskeletal:        General: Normal range of motion.     Cervical back: Neck supple.     Comments: Right knee moderate swelling compared to left, no overlying erythema or warmth, relatively full active range of motion, slightly limited with full flexion due to amount of swelling, tender to palpation along medial joint line, nontender to palpation over patella, lateral joint line or popliteal area, nontender to palpation throughout calf  Skin:    General: Skin is warm and dry.  Neurological:     Mental Status: She is alert and oriented to person, place, and time.      UC Treatments / Results  Labs (all labs ordered are listed, but only abnormal results are displayed) Labs Reviewed - No data to display  EKG   Radiology No results found.  Procedures Procedures (including critical care time)  Medications Ordered in UC Medications - No data to display  Initial Impression / Assessment and Plan / UC Course  I have reviewed the triage vital signs and the nursing notes.  Pertinent labs & imaging results that were available during my care of the patient were reviewed by me and considered in my medical decision making (see chart for details).     Suspect likely inflammatory likely from underlying arthritis.  History of diabetes and heart failure, will defer steroids for now and will place on NSAIDs briefly.   Provided diclofenac, Ace wrap for compression swelling.  Ice and elevate.  Provided contact for sports medicine follow-up if having recurrent issues or persistent issues, may benefit from joint injections with steroids.  Discussed strict return precautions. Patient verbalized understanding and is agreeable with plan.   Final Clinical Impressions(s) / UC Diagnoses   Final diagnoses:  Acute pain of right knee     Discharge Instructions     Diclofenac twice daily with food Ice and elevate knee Ace wrap for compression and swelling  Follow-up if symptoms not improving or worsening   ED Prescriptions    Medication Sig Dispense Auth. Provider   diclofenac (VOLTAREN) 50 MG EC tablet Take 1 tablet (50 mg total) by mouth 2 (two) times daily for 10 days. 20 tablet Yudit Modesitt, [place] C, PA-C     PDMP not reviewed this encounter.   Lew Dawes, [place] 12/24/19 [phone]

## 2019-12-25 ENCOUNTER — Telehealth (HOSPITAL_COMMUNITY): Payer: Self-pay | Admitting: *Deleted

## 2019-12-25 NOTE — Telephone Encounter (Signed)
Left message on voicemail per DPR in reference to upcoming appointment scheduled on 12/30/19 at 7:45 with detailed instructions given per Myocardial Perfusion Study Information Sheet for the test. LM to arrive 15 minutes early, and that it is imperative to arrive on time for appointment to keep from having the test rescheduled. If you need to cancel or reschedule your appointment, please call the office within 24 hours of your appointment. Failure to do so may result in a cancellation of your appointment, and a $50 no show fee. Phone number given for call back for any questions.

## 2019-12-27 ENCOUNTER — Other Ambulatory Visit: Payer: Self-pay | Admitting: *Deleted

## 2019-12-27 NOTE — Patient Outreach (Signed)
[facility]) Care Management  12/27/2019  Whitney Sloan November 25, 1955 [id number]   Managed MCD-2nd unsuccessful outreach  RN attempted outreach today however unsuccessful. RN able to leave a HIPAA approved voice message requesting a call back.  Plan: Will follow up with another outreach next week for pending services.  [name], RN Care Management Coordinator [facility] Main Office [phone]

## 2019-12-30 ENCOUNTER — Ambulatory Visit (HOSPITAL_COMMUNITY): Payer: Medicaid Other | Attending: Cardiology

## 2019-12-30 ENCOUNTER — Other Ambulatory Visit: Payer: Self-pay

## 2019-12-30 DIAGNOSIS — R079 Chest pain, unspecified: Secondary | ICD-10-CM | POA: Diagnosis not present

## 2019-12-30 DIAGNOSIS — I5031 Acute diastolic (congestive) heart failure: Secondary | ICD-10-CM | POA: Diagnosis not present

## 2019-12-30 MED ORDER — REGADENOSON 0.4 MG/5ML IV SOLN
0.4000 mg | Freq: Once | INTRAVENOUS | Status: AC
  Administered 2019-12-30: 0.4 mg via INTRAVENOUS

## 2019-12-30 MED ORDER — TECHNETIUM TC 99M TETROFOSMIN IV KIT
32.9000 | PACK | Freq: Once | INTRAVENOUS | Status: AC | PRN
  Administered 2019-12-30: 32.9 via INTRAVENOUS
  Filled 2019-12-30: qty 33

## 2019-12-31 ENCOUNTER — Ambulatory Visit (HOSPITAL_COMMUNITY): Payer: Medicaid Other | Attending: Internal Medicine

## 2019-12-31 DIAGNOSIS — R9439 Abnormal result of other cardiovascular function study: Secondary | ICD-10-CM

## 2019-12-31 HISTORY — DX: Abnormal result of other cardiovascular function study: R94.39

## 2019-12-31 LAB — MYOCARDIAL PERFUSION IMAGING
LV dias vol: 134 mL (ref 46–106)
LV sys vol: 82 mL
Peak HR: 108 {beats}/min
Rest HR: 88 {beats}/min
SDS: 1
SRS: 0
SSS: 1
TID: 0.99

## 2019-12-31 MED ORDER — TECHNETIUM TC 99M TETROFOSMIN IV KIT
33.0000 | PACK | Freq: Once | INTRAVENOUS | Status: AC | PRN
  Administered 2019-12-31: 33 via INTRAVENOUS
  Filled 2019-12-31: qty 33

## 2020-01-01 NOTE — Progress Notes (Signed)
Cardiology Office Note:    Date:  01/04/2020   ID:  GENESYS SETTERBERG, DOB September 13, 1955, MRN [id number]  PCP:  Zachery Dauer, FNP  Cardiologist:  Little Ishikawa, MD  Electrophysiologist:  None   Referring MD: Carmel Sacramento, NP   Chief Complaint  Patient presents with  . Congestive Heart Failure    History of Present Illness:    Whitney Sloan is a 64 y.o. female with a hx of chronic diastolic heart failure, T2DM, hypertension, hyperlipidemia, obesity, CVA in 2011  who presents for follow-up.  She had admission to [facility] for diastolic heart failure exacerbation on 11/08/2019.  She was in respiratory distress and started on BiPAP and nitroglycerin drip on admission.  TTE showed LVEF 50 to 55%, grade 2 diastolic function, normal RV function, no significant valvular disease.  She was diuresed with IV Lasix, was net negative over 10 L during admission.  She was discharged on Lasix 40 mg daily, lisinopril 40 mg daily, amlodipine 10 mg daily.  At follow-up appointment on 12/02/2019, she had gained 10 pounds and reported dyspnea with minimal exertion.  Lasix was increased to 80 mg twice daily.  She also reported she had been having chest pain 2-3 times per week.  Describes a squeezing pain in her left upper chest.  Lasts few minutes and resolves.  No clear relationship with exertion.  Underwent Lexiscan Myoview on 12/31/2019, which showed inferior ischemia, EF 39%.  Since last clinic visit, she reports her chest pain has improved.  She is currently taking Lasix 80 mg daily, reports dyspnea has improved.  She denies any palpitations, lightheadedness, or syncope.   Wt Readings from Last 3 Encounters:  01/02/20 238 lb (108 kg)  12/30/19 231 lb (104.8 kg)  12/19/19 231 lb (104.8 kg)     Past Medical History:  Diagnosis Date  . Chronic diastolic (congestive) heart failure (HCC)   . Diabetes mellitus   . Hyperlipemia   . Hypertension   . Psoriasis     Past Surgical History:  Procedure  Laterality Date  . ANAL FISSURE REPAIR    . ENDOMETRIAL ABLATION      Current Medications: Current Meds  Medication Sig  . amLODipine (NORVASC) 10 MG tablet Take 1 tablet (10 mg total) by mouth daily.  Marland Kitchen aspirin (ASPIR-LOW) 81 MG EC tablet Take 81 mg by mouth every morning.   Marland Kitchen atorvastatin (LIPITOR) 20 MG tablet Take 1 tablet (20 mg total) by mouth at bedtime.  . bisacodyl (DULCOLAX) 10 MG suppository Place 10 mg rectally daily as needed for moderate constipation.  . Cholecalciferol (VITAMIN D PO) Take 1 capsule by mouth daily.  Marland Kitchen ezetimibe (ZETIA) 10 MG tablet Take 10 mg by mouth at bedtime.  Marland Kitchen ibuprofen (ADVIL) 200 MG tablet Take 200 mg by mouth every 6 (six) hours as needed for fever, headache or moderate pain.  Marland Kitchen insulin aspart (NOVOLOG FLEXPEN) 100 UNIT/ML FlexPen Inject 16 Units into the skin 3 (three) times daily with meals.  . insulin glargine (LANTUS SOLOSTAR) 100 UNIT/ML Solostar Pen Inject 40 Units into the skin daily.  Marland Kitchen lisinopril (ZESTRIL) 40 MG tablet Take 1 tablet (40 mg total) by mouth daily.  . metFORMIN (GLUCOPHAGE) 500 MG tablet Take 1,000 mg by mouth 2 (two) times daily.   . polyethylene glycol (MIRALAX / GLYCOLAX) 17 g packet Take 17 g by mouth daily.  . Sennosides (EX-LAX) 15 MG TABS Take 15 mg by mouth daily as needed (constipation).  Marland Kitchen UNIFINE PENTIPS  31G X 6 MM MISC USE TO INJECT INSULIN  . [DISCONTINUED] furosemide (LASIX) 80 MG tablet Take 1 tablet (80 mg total) by mouth daily.  . [DISCONTINUED] furosemide (LASIX) 80 MG tablet Take 1 tablet (80 mg total) by mouth 2 (two) times daily.   Current Facility-Administered Medications for the 01/02/20 encounter (Office Visit) with Little Ishikawa, MD  Medication  . sodium chloride flush (NS) 0.9 % injection 3 mL     Allergies:   Oxycodone-acetaminophen   Social History   Socioeconomic History  . Marital status: Divorced    Spouse name: Not on file  . Number of children: Not on file  . Years of  education: Not on file  . Highest education level: Not on file  Occupational History  . Occupation: disabled  Tobacco Use  . Smoking status: Never Smoker  . Smokeless tobacco: Never Used  Substance and Sexual Activity  . Alcohol use: No  . Drug use: No  . Sexual activity: Not on file  Other Topics Concern  . Not on file  Social History Narrative  . Not on file   Social Determinants of Health   Financial Resource Strain:   . Difficulty of Paying Living Expenses:   Food Insecurity:   . Worried About Programme researcher, broadcasting/film/video in the Last Year:   . Barista in the Last Year:   Transportation Needs:   . Freight forwarder (Medical):   Marland Kitchen Lack of Transportation (Non-Medical):   Physical Activity:   . Days of Exercise per Week:   . Minutes of Exercise per Session:   Stress:   . Feeling of Stress :   Social Connections:   . Frequency of Communication with Friends and Family:   . Frequency of Social Gatherings with Friends and Family:   . Attends Religious Services:   . Active Member of Clubs or Organizations:   . Attends Banker Meetings:   Marland Kitchen Marital Status:      Family History: The patient's family history includes Dementia in her mother; Diabetes in an other family member; Renal Disease in her father.  ROS:   Please see the history of present illness.     All other systems reviewed and are negative.  EKGs/Labs/Other Studies Reviewed:    The following studies were reviewed today:   EKG:  EKG is ordered today.  The ekg ordered today demonstrates normal sinus rhythm, rate T wave inversions in leads I, aVL, rate 96  Recent Labs: 12/09/2019: ALT 13 12/10/2019: B Natriuretic Peptide 266.9 01/02/2020: BUN 11; Creatinine, Ser 0.67; Hemoglobin 11.3; Magnesium 1.6; Platelets 275; Potassium 3.9; Sodium 142  Recent Lipid Panel    Component Value Date/Time   CHOL 168 11/13/2019 0218   TRIG 97 11/13/2019 0218   HDL 48 11/13/2019 0218   CHOLHDL 3.5 11/13/2019  0218   VLDL 19 11/13/2019 0218   LDLCALC 101 (H) 11/13/2019 0218    Physical Exam:    VS:  BP (!) 150/82   Pulse 96   Ht 5\' 5"  (1.651 m)   Wt 238 lb (108 kg)   BMI 39.61 kg/m     Wt Readings from Last 3 Encounters:  01/02/20 238 lb (108 kg)  12/30/19 231 lb (104.8 kg)  12/19/19 231 lb (104.8 kg)     GEN: in no acute distress HEENT: Normal NECK: No JVD appreciated but difficult to assess given body CARDIAC: RRR, no murmurs, rubs, gallops RESPIRATORY:  Clear to auscultation without  rales, wheezing or rhonchi  ABDOMEN: Soft, non-tender, non-distended MUSCULOSKELETAL:  2+ BLE edema SKIN: Warm and dry NEUROLOGIC:  Alert and oriented x 3 PSYCHIATRIC:  Normal affect   ASSESSMENT:    1. Chest pain of uncertain etiology   2. Acute on chronic diastolic heart failure (HCC)   3. Abnormal nuclear stress test   4. Pre-procedure lab exam   5. Hypertension, unspecified type   6. Hyperlipidemia, unspecified hyperlipidemia type    PLAN:    Chest pain: Atypical in description, but does have significant CAD risk factors (diabetes, hypertension, hyperlipidemia).  Lexiscan Myoview shows inferior ischemia, EF 39%.  Will evaluate further with LHC/RHC, scheduled for 01/16/20 with Dr Herbie Baltimore.  Chronic diastolic heart failure: TTE showed EF 50-55% with G2DD.  Was diuresed over 10 L during admission in May.  Discharged on Lasix 40 mg daily.  Gained 10 pounds at f/u appointment 6/21, Lasix dose was increased to 80 mg twice daily with improvement in symptoms.  Decreased back to 80 mg daily after recent hospitalization.  Will check BMP, magnesium  Hypertension: Continue lisinopril 40 mg daily and amlodipine 10 mg daily.  Appears controlled  HLD: LDL 101 on 11/13/2018.  Started atorvastatin 20 mg daily  Type 2 diabetes: On insulin.  A1c 10.1  RTC in 1 month  Medication Adjustments/Labs and Tests Ordered: Current medicines are reviewed at length with the patient today.  Concerns regarding  medicines are outlined above.  Orders Placed This Encounter  Procedures  . Basic metabolic panel  . CBC  . Magnesium  . EKG 12-Lead   Meds ordered this encounter  Medications  . DISCONTD: furosemide (LASIX) 80 MG tablet    Sig: Take 1 tablet (80 mg total) by mouth 2 (two) times daily.    Dispense:  180 tablet    Refill:  3    Dose change    Patient Instructions     [place] [facility] CARDIOVASCULAR DIVISION [facility] [address] [place] [address] Dept: [phone] Loc: [phone]  NAYAH LUKENS  01/02/2020  You are scheduled for a Cardiac Catheterization on Monday, July 26 with Dr. Peter Swaziland.  1. Please arrive at the [facility] (Main Entrance A) at [facility]: [address] [place], [place] [address] at 5:30 AM (This time is two hours before your procedure to ensure your preparation). Free valet parking service is available.   Special note: Every effort is made to have your procedure done on time. Please understand that emergencies sometimes delay scheduled procedures.  2. Diet: Do not eat solid foods after midnight.  The patient may have clear liquids until 5am upon the day of the procedure.  3. Labs: Today in office  4. Medication instructions in preparation for your procedure:   Contrast Allergy: No  Hold furosemide (Lasix) Monday morning  Take 1/2 of your normal dose of insulin Sunday night  No insulin Monday morning  Do not take Diabetes Med Glucophage (Metformin) on the day of the procedure and HOLD 48 HOURS AFTER THE PROCEDURE.  On the morning of your procedure, take your Aspirin and any morning medicines NOT listed above.  You may use sips of water.  5. Plan for one night stay--bring personal belongings. 6. Bring a current list of your medications and current insurance cards. 7. You MUST have a responsible person to drive you home. 8. Someone MUST be with you the first 24 hours after  you arrive home or your  discharge will be delayed. 9. Please wear clothes that are easy to get on and off and wear slip-on shoes.  Thank you for allowing [place] to care for you!   -- [place] [facility]    INCREASE your furosemide (Lasix) to 80 mg two times daily  Labs today (BMET, CBC, Mag)  Follow up with Dr. Bjorn Pippin 2 weeks after procedure     Signed, Little Ishikawa, MD  01/04/2020 11:18 AM    [facility]

## 2020-01-02 ENCOUNTER — Other Ambulatory Visit: Payer: Self-pay

## 2020-01-02 ENCOUNTER — Other Ambulatory Visit: Payer: Self-pay | Admitting: *Deleted

## 2020-01-02 ENCOUNTER — Ambulatory Visit (INDEPENDENT_AMBULATORY_CARE_PROVIDER_SITE_OTHER): Payer: Medicaid Other | Admitting: Cardiology

## 2020-01-02 VITALS — BP 150/82 | HR 96 | Ht 65.0 in | Wt 238.0 lb

## 2020-01-02 DIAGNOSIS — R079 Chest pain, unspecified: Secondary | ICD-10-CM

## 2020-01-02 DIAGNOSIS — I10 Essential (primary) hypertension: Secondary | ICD-10-CM

## 2020-01-02 DIAGNOSIS — I5033 Acute on chronic diastolic (congestive) heart failure: Secondary | ICD-10-CM | POA: Diagnosis not present

## 2020-01-02 DIAGNOSIS — E785 Hyperlipidemia, unspecified: Secondary | ICD-10-CM | POA: Diagnosis not present

## 2020-01-02 DIAGNOSIS — Z01812 Encounter for preprocedural laboratory examination: Secondary | ICD-10-CM

## 2020-01-02 DIAGNOSIS — R9439 Abnormal result of other cardiovascular function study: Secondary | ICD-10-CM

## 2020-01-02 LAB — BASIC METABOLIC PANEL
BUN/Creatinine Ratio: 16 (ref 12–28)
BUN: 11 mg/dL (ref 8–27)
CO2: 24 mmol/L (ref 20–29)
Calcium: 9.5 mg/dL (ref 8.7–10.3)
Chloride: 102 mmol/L (ref 96–106)
Creatinine, Ser: 0.67 mg/dL (ref 0.57–1.00)
GFR calc Af Amer: 107 mL/min/{1.73_m2} (ref >59)
GFR calc non Af Amer: 93 mL/min/{1.73_m2} (ref >59)
Glucose: 169 mg/dL — ABNORMAL HIGH (ref 65–99)
Potassium: 3.9 mmol/L (ref 3.5–5.2)
Sodium: 142 mmol/L (ref 134–144)

## 2020-01-02 LAB — CBC
Hematocrit: 35.3 % (ref 34.0–46.6)
Hemoglobin: 11.3 g/dL (ref 11.1–15.9)
MCH: 25.6 pg — ABNORMAL LOW (ref 26.6–33.0)
MCHC: 32 g/dL (ref 31.5–35.7)
MCV: 80 fL (ref 79–97)
Platelets: 275 10*3/uL (ref 150–450)
RBC: 4.42 x10E6/uL (ref 3.77–5.28)
RDW: 14.9 % (ref 11.7–15.4)
WBC: 8.2 10*3/uL (ref 3.4–10.8)

## 2020-01-02 LAB — MAGNESIUM: Magnesium: 1.6 mg/dL (ref 1.6–2.3)

## 2020-01-02 MED ORDER — SODIUM CHLORIDE 0.9% FLUSH: 3.0000 mL | Freq: Two times a day (BID) | INTRAVENOUS | Status: DC

## 2020-01-02 MED ORDER — FUROSEMIDE 80 MG PO TABS: 80.0000 mg | ORAL_TABLET | Freq: Two times a day (BID) | ORAL | 3 refills | Status: DC

## 2020-01-02 NOTE — Patient Outreach (Signed)
[facility]) Care Management  01/02/2020  DIARA CHAUDHARI 08-Jul-1955 [id number]  Telephone Assessment-Unsuccessful Managed MCD Referral  RN 3rd outreach attempt unsuccessful. RN able to leave a HIPAA approved voice message requesting a call back for pending services.  Plan: Will close this case if no response based upon workflow call attempts.  [name], RN Care Management Coordinator [facility] Main Office [phone]

## 2020-01-02 NOTE — Patient Instructions (Addendum)
    [place] [facility] CARDIOVASCULAR DIVISION [facility] [address] [place] [address] Dept: [phone] Loc: [phone]  JOMARIE GELLIS  01/02/2020  You are scheduled for a Cardiac Catheterization on Monday, July 26 with Dr. Peter Swaziland.  1. Please arrive at the [facility] (Main Entrance A) at [facility]: [address] [place], [place] [address] at 5:30 AM (This time is two hours before your procedure to ensure your preparation). Free valet parking service is available.   Special note: Every effort is made to have your procedure done on time. Please understand that emergencies sometimes delay scheduled procedures.  2. Diet: Do not eat solid foods after midnight.  The patient may have clear liquids until 5am upon the day of the procedure.  3. Labs: Today in office  4. Medication instructions in preparation for your procedure:   Contrast Allergy: No  Hold furosemide (Lasix) Monday morning  Take 1/2 of your normal dose of insulin Sunday night  No insulin Monday morning  Do not take Diabetes Med Glucophage (Metformin) on the day of the procedure and HOLD 48 HOURS AFTER THE PROCEDURE.  On the morning of your procedure, take your Aspirin and any morning medicines NOT listed above.  You may use sips of water.  5. Plan for one night stay--bring personal belongings. 6. Bring a current list of your medications and current insurance cards. 7. You MUST have a responsible person to drive you home. 8. Someone MUST be with you the first 24 hours after you arrive home or your discharge will be delayed. 9. Please wear clothes that are easy to get on and off and wear slip-on shoes.  Thank you for allowing [place] to care for you!   -- [place] [facility]    INCREASE your furosemide (Lasix) to 80 mg two times daily  Labs today (BMET, CBC, Mag)  Follow up with Dr. Bjorn Pippin 2 weeks after procedure

## 2020-01-03 ENCOUNTER — Telehealth: Payer: Self-pay | Admitting: Cardiology

## 2020-01-03 ENCOUNTER — Other Ambulatory Visit: Payer: Self-pay | Admitting: *Deleted

## 2020-01-03 DIAGNOSIS — I5033 Acute on chronic diastolic (congestive) heart failure: Secondary | ICD-10-CM

## 2020-01-03 DIAGNOSIS — Z79899 Other long term (current) drug therapy: Secondary | ICD-10-CM

## 2020-01-03 MED ORDER — MAGNESIUM OXIDE 400 MG PO TABS: 400.0000 mg | ORAL_TABLET | Freq: Every day | ORAL | 3 refills | Status: DC

## 2020-01-03 MED ORDER — FUROSEMIDE 80 MG PO TABS: 80.0000 mg | ORAL_TABLET | Freq: Every day | ORAL | 3 refills | Status: DC

## 2020-01-03 MED ORDER — POTASSIUM CHLORIDE CRYS ER 20 MEQ PO TBCR
20.0000 meq | EXTENDED_RELEASE_TABLET | Freq: Every day | ORAL | 3 refills | Status: DC
Start: 2020-01-03 — End: 2020-12-18

## 2020-01-03 NOTE — Telephone Encounter (Signed)
lmtcb to see when patient would like to reschedule.

## 2020-01-03 NOTE — Telephone Encounter (Signed)
Attempt to call, no answer

## 2020-01-03 NOTE — Telephone Encounter (Signed)
Spoke to patient, request to reschedule for 8/5.   Called cath lab-scheduled for 8/5 with Dr. Laury Deep at [facility] at 7 AM for 9AM procedure. Labs completed 7/22 and EKG.

## 2020-01-03 NOTE — Telephone Encounter (Signed)
Spoke with patient, she is agreeable to proceeding with cath but like to reschedule to a different day

## 2020-01-03 NOTE — Telephone Encounter (Signed)
Tried calling patient but no answer.  Left VM.

## 2020-01-03 NOTE — Telephone Encounter (Signed)
Patient called and wanted to ask some questions to the Nurse about her upcoming Heart Cath procedure. Please call to address questions

## 2020-01-03 NOTE — Patient Outreach (Signed)
[facility]) Care Management  01/03/2020  Whitney Sloan 1956-01-24 [id number]   Case closure unsuccessful contacts and no response to outreach letter within the 10 days allowed via workflow.   Will notify pt's provider of case closure.  [name], RN Care Management Coordinator [facility] Main Office [phone]

## 2020-01-03 NOTE — Telephone Encounter (Signed)
Returned call to patient-patient states she has a lot of questions about the procedure on Monday (R & L cath).  She states she went home and discussed things with her daughters and has many concerns.  She states she didn't think to ask at her appt because she was "shook up".    She is worried about the risk and if there is any alternatives to this.    Advised would be best to discuss with MD if she is in any way uncomfortable or having hesitations about procedure.   Will request Dr. Bjorn Pippin call to discuss.  Procedure Monday 7/26.      Lab results discussed with patient as well.   rx sent to pharmacy.

## 2020-01-06 ENCOUNTER — Ambulatory Visit (HOSPITAL_COMMUNITY): Admission: RE | Admit: 2020-01-06 | Payer: Medicaid Other | Source: Home / Self Care | Admitting: Cardiology

## 2020-01-06 ENCOUNTER — Encounter (HOSPITAL_COMMUNITY): Admission: RE | Payer: Self-pay | Source: Home / Self Care

## 2020-01-06 SURGERY — RIGHT/LEFT HEART CATH AND CORONARY ANGIOGRAPHY
Anesthesia: LOCAL

## 2020-01-09 NOTE — Telephone Encounter (Signed)
Left message to call back on cell and home phone.

## 2020-01-10 NOTE — Telephone Encounter (Signed)
Left a message for the patient to call back.  

## 2020-01-13 NOTE — Telephone Encounter (Signed)
Left a message for the patient to call back.  

## 2020-01-14 ENCOUNTER — Telehealth: Payer: Self-pay | Admitting: *Deleted

## 2020-01-14 NOTE — Telephone Encounter (Signed)
Pt contacted pre-catheterization scheduled at [facility] for: Thursday January 16, 2020 9 AM Verified arrival time and place: [facility] Main Entrance [facility]) at: 7 AM   No solid food after midnight prior to cath, clear liquids until 5 AM day of procedure.  Hold: Lasix/KCl- AM of procedure Insulin-AM of procedure-1/2 usual HS dose prior to procedure Metformin-day of procedure and 48 hours post procedure   Except hold medications AM meds can be  taken pre-cath with sips of water including: ASA 81 mg   Confirmed patient has responsible adult to drive home post procedure and observe 24 hours after arriving home:   You are allowed ONE visitor in the waiting room during your procedure. Both you and your visitor must wear a mask once you enter the hospital.      COVID-19 Pre-Screening Questions:  . In the past 14 days have you had a new cough associated with shortness of breath, fever (100.4 or greater) or sudden loss of taste or sense of smell? Marland Kitchen In the past 14 days have you been around anyone with known Covid 19? Marland Kitchen Have you been vaccinated for COVID-19?  Call placed to patient to review procedure instructions, no answer,no voicemail.

## 2020-01-14 NOTE — Telephone Encounter (Signed)
Patient returning call.

## 2020-01-14 NOTE — Telephone Encounter (Signed)
LMTCB at phone number listed to review procedure instructions with patient.

## 2020-01-15 NOTE — Telephone Encounter (Signed)
LMTCB for pt to review procedure instructions 

## 2020-01-15 NOTE — Telephone Encounter (Signed)
LMTCB for pt at home and mobile numbers listed to confirm patient plans to proceed with [facility] 01/16/20.

## 2020-01-15 NOTE — Telephone Encounter (Signed)
LMTCB for pt to discuss if she plans to proceed with [facility] scheduled 01/16/20.

## 2020-01-15 NOTE — Telephone Encounter (Signed)
I spoke with patient. Pt states she wants to put [facility] on hold for now and has asked me to cancel [facility] scheduled 01/16/20. Pt states she will keep appt already scheduled with Theodore Demark, PA 01/23/20, will discuss her concerns about having  R/LHC and possibly rescheduling after that appt.  Pt states she is concerned about how much insurance would pay and what her responsibility would be after insurance. I have provided her with phone number for [facility] to assist her with insurance questions and financial assistance that may be available to her.   Pt advised I will cancel R/LHC scheduled 01/16/20 and will forward to Dr Bjorn Pippin so he is aware she has cancelled.

## 2020-01-16 ENCOUNTER — Ambulatory Visit (HOSPITAL_COMMUNITY): Admission: RE | Admit: 2020-01-16 | Payer: Medicaid Other | Source: Home / Self Care | Admitting: Cardiology

## 2020-01-16 ENCOUNTER — Encounter (HOSPITAL_COMMUNITY): Admission: RE | Payer: Self-pay | Source: Home / Self Care

## 2020-01-16 SURGERY — RIGHT/LEFT HEART CATH AND CORONARY ANGIOGRAPHY
Anesthesia: LOCAL

## 2020-01-21 DIAGNOSIS — I10 Essential (primary) hypertension: Secondary | ICD-10-CM | POA: Diagnosis not present

## 2020-01-21 DIAGNOSIS — E782 Mixed hyperlipidemia: Secondary | ICD-10-CM | POA: Diagnosis not present

## 2020-01-21 DIAGNOSIS — R5383 Other fatigue: Secondary | ICD-10-CM | POA: Diagnosis not present

## 2020-01-21 DIAGNOSIS — E1165 Type 2 diabetes mellitus with hyperglycemia: Secondary | ICD-10-CM | POA: Diagnosis not present

## 2020-01-23 ENCOUNTER — Ambulatory Visit (INDEPENDENT_AMBULATORY_CARE_PROVIDER_SITE_OTHER): Payer: Medicaid Other | Admitting: Physician Assistant

## 2020-01-23 ENCOUNTER — Encounter: Payer: Self-pay | Admitting: Physician Assistant

## 2020-01-23 ENCOUNTER — Other Ambulatory Visit: Payer: Self-pay

## 2020-01-23 VITALS — BP 150/78 | HR 57 | Ht 65.0 in | Wt 235.0 lb

## 2020-01-23 DIAGNOSIS — I10 Essential (primary) hypertension: Secondary | ICD-10-CM | POA: Diagnosis not present

## 2020-01-23 DIAGNOSIS — I5032 Chronic diastolic (congestive) heart failure: Secondary | ICD-10-CM | POA: Diagnosis not present

## 2020-01-23 DIAGNOSIS — R9439 Abnormal result of other cardiovascular function study: Secondary | ICD-10-CM | POA: Diagnosis not present

## 2020-01-23 MED ORDER — ISOSORBIDE MONONITRATE ER 30 MG PO TB24
30.0000 mg | ORAL_TABLET | Freq: Every day | ORAL | 3 refills | Status: DC
Start: 2020-01-23 — End: 2020-10-14

## 2020-01-23 MED ORDER — NITROGLYCERIN 0.4 MG SL SUBL
0.4000 mg | SUBLINGUAL_TABLET | SUBLINGUAL | 4 refills | Status: DC | PRN
Start: 2020-01-23 — End: 2021-03-10

## 2020-01-23 NOTE — Patient Instructions (Signed)
Medication Instructions:   START Isosorbide 30 mg daily.  Nitroglycerin 0.4 mg--take 1 tablet every 5 minutes as needed for chest pain (MAX 3 doses in 24 hours)  *If you need a refill on your cardiac medications before your next appointment, please call your pharmacy*   Follow-Up: At [facility], you and your health needs are our priority.  As part of our continuing mission to provide you with exceptional heart care, we have created designated Provider Care Teams.  These Care Teams include your primary Cardiologist (physician) and Advanced Practice Providers (APPs -  Physician Assistants and Nurse Practitioners) who all work together to provide you with the care you need, when you need it.  We recommend signing up for the patient portal called "MyChart".  Sign up information is provided on this After Visit Summary.  MyChart is used to connect with patients for Virtual Visits (Telemedicine).  Patients are able to view lab/test results, encounter notes, upcoming appointments, etc.  Non-urgent messages can be sent to your provider as well.   To learn more about what you can do with MyChart, go to [email or web address].    Your next appointment:   1 month(s)  The format for your next appointment:   In Person  Provider:   Epifanio Lesches, MD   Other Instructions Your physician has requested that you regularly monitor and record your blood pressure readings at home. Please use the same machine at the same time of day to check your readings and record them to bring to your follow-up visit.

## 2020-01-23 NOTE — Progress Notes (Signed)
Cardiology Office Note   Date:  01/23/2020   ID:  Whitney Sloan, DOB Oct 20, 1955, MRN [id number]  PCP:  Zachery Dauer, FNP Cardiologist:  Little Ishikawa, MD 01/02/2020 Electrphysiologist: None Theodore Demark, PA-C   No chief complaint on file.   History of Present Illness: Whitney Sloan is a 63 y.o. female with a history of chronic diastolic heart failure, T2DM, hypertension, hyperlipidemia, obesity, CVA in2011  Admitted 05/28 for CHF req BiPAP, EF 50-55% w/ grade 2 dd 06/21 office visit wt up 10 lbs >> Lasix 80 mg bid 07/22 office visit, pt w/ CP >> LHC ordered due to abn MV, wt 238 lbs 08/04 phone notes regarding $$ concerns for cath >>canceled  Whitney Sloan presents for cardiology follow up.   Her weight had been as low as 228, now is running between 230-232 lbs. Her breathing has been stable. LE edema has improved, she does not wake with it. Denies orthopnea or PND.   She has not had any chest pain. She had some brief L shoulder pain, thinks that is what caused the MV to be done.   She had abdominal pain, thinks that was coming from a yeast infection that has been treated.   She is very anxious about getting a heart catheterization.   She has questions: 1. Can we tell how blocked her arteries are? No, just that there are blockages enough to cause decreased circulation in her heart during stress 2. Can we use medicine to reduce the blockages? No, the risk of her having a heart attack is too high, we can use medications to try and keep them from getting worse 3. Do we get rid of the blockage? Not usually, the stent compresses it 4. I am uncomfortable with the risks, can we reduce them? Not really, you are on the low side because your kidney function is normal.  5. Financial concerns, has not called them yet. I do not know what her copay will be.   She had some appointments this am, because of that, has not had her medications.   No palpitations, no  presyncope or syncope.   Past Medical History:  Diagnosis Date  . Abnormal stress test 12/31/2019   medium defect of moderate severity present in the basal inferior, mid inferior and apex location.  Findings consistent with ischemia  . Chronic diastolic (congestive) heart failure (HCC)   . Diabetes mellitus   . Hyperlipemia   . Hypertension   . Psoriasis     Past Surgical History:  Procedure Laterality Date  . ANAL FISSURE REPAIR    . ENDOMETRIAL ABLATION      Current Outpatient Medications  Medication Sig Dispense Refill  . amLODipine (NORVASC) 10 MG tablet Take 1 tablet (10 mg total) by mouth daily. 30 tablet 1  . aspirin (ASPIR-LOW) 81 MG EC tablet Take 81 mg by mouth every morning.     Marland Kitchen atorvastatin (LIPITOR) 20 MG tablet Take 1 tablet (20 mg total) by mouth at bedtime. 30 tablet 1  . Cholecalciferol (VITAMIN D PO) Take 1 capsule by mouth daily.    . clotrimazole (GYNE-LOTRIMIN) 1 % vaginal cream Place 1 Applicatorful vaginally daily as needed (yeast).    . ezetimibe (ZETIA) 10 MG tablet Take 10 mg by mouth at bedtime.    . furosemide (LASIX) 80 MG tablet Take 1 tablet (80 mg total) by mouth daily. 90 tablet 3  . ibuprofen (ADVIL) 200 MG tablet Take 400 mg by  mouth every 4 (four) hours as needed for fever, headache or moderate pain.     Marland Kitchen insulin aspart (NOVOLOG FLEXPEN) 100 UNIT/ML FlexPen Inject 16 Units into the skin 3 (three) times daily with meals. 30 mL 6  . insulin glargine (LANTUS SOLOSTAR) 100 UNIT/ML Solostar Pen Inject 40 Units into the skin daily. 30 mL 3  . lisinopril (ZESTRIL) 40 MG tablet Take 1 tablet (40 mg total) by mouth daily. 30 tablet 1  . magnesium oxide (MAG-OX) 400 MG tablet Take 1 tablet (400 mg total) by mouth daily. 30 tablet 3  . metFORMIN (GLUCOPHAGE) 500 MG tablet Take 500 mg by mouth 2 (two) times daily.     Marland Kitchen nystatin (NYSTATIN) powder Apply 1 application topically daily as needed (yeast).    . polyethylene glycol (MIRALAX / GLYCOLAX) 17 g  packet Take 17 g by mouth daily.    . potassium chloride SA (KLOR-CON) 20 MEQ tablet Take 1 tablet (20 mEq total) by mouth daily. 30 tablet 3  . Sennosides (EX-LAX) 15 MG TABS Take 15 mg by mouth daily as needed (constipation).    . tamsulosin (FLOMAX) 0.4 MG CAPS capsule Take 0.4 mg by mouth daily.    Marland Kitchen UNIFINE PENTIPS 31G X 6 MM MISC USE TO INJECT INSULIN 100 each PRN  . isosorbide mononitrate (IMDUR) 30 MG 24 hr tablet Take 1 tablet (30 mg total) by mouth daily. 30 tablet 3  . nitroGLYCERIN (NITROSTAT) 0.4 MG SL tablet Place 1 tablet (0.4 mg total) under the tongue every 5 (five) minutes as needed for chest pain. 25 tablet 4   Current Facility-Administered Medications  Medication Dose Route Frequency Provider Last Rate Last Admin  . sodium chloride flush (NS) 0.9 % injection 3 mL  3 mL Intravenous Q12H Little Ishikawa, MD        Allergies:   Oxycodone-acetaminophen    Social History:  The patient  reports that she has never smoked. She has never used smokeless tobacco. She reports that she does not drink alcohol and does not use drugs.   Family History:  The patient's family history includes Dementia in her mother; Diabetes in an other family member; Renal Disease in her father.  She indicated that her mother is deceased. She indicated that her father is deceased. She indicated that her other is alive.   ROS:  Please see the history of present illness. All other systems are reviewed and negative.    PHYSICAL EXAM: VS:  BP (!) 150/78   Pulse (!) 57   Ht 5\' 5"  (1.651 m)   Wt 235 lb (106.6 kg)   SpO2 94%   BMI 39.11 kg/m  , BMI Body mass index is 39.11 kg/m. GEN: Well nourished, well developed, female in no acute distress HEENT: normal for age  Neck: no JVD, no carotid bruit, no masses Cardiac: RRR; no murmur, no rubs, or gallops Respiratory:  clear to auscultation bilaterally, normal work of breathing GI: soft, nontender, nondistended, + BS MS: no deformity or  atrophy; no edema; distal pulses are 2+ in all 4 extremities  Skin: warm and dry, no rash Neuro:  Strength and sensation are intact Psych: euthymic mood, full affect   EKG:  EKG is not ordered today.   ECHO: 11/08/2019 1. Left ventricular ejection fraction, by estimation, is 50 to 55%. The  left ventricle has low normal function. The left ventricle demonstrates  global hypokinesis. Left ventricular diastolic parameters are consistent  with Grade II diastolic dysfunction  (  pseudonormalization). Elevated left ventricular end-diastolic pressure.  2. Right ventricular systolic function is normal. The right ventricular  size is normal.  3. The mitral valve is abnormal. Trivial mitral valve regurgitation.  4. The aortic valve is tricuspid. Aortic valve regurgitation is not  visualized. Mild aortic valve sclerosis is present, with no evidence of  aortic valve stenosis.  5. The inferior vena cava is dilated in size with >50% respiratory  variability, suggesting right atrial pressure of 8 mmHg.   CATH: RECOMMENDED  MYOVIEW:  12/31/2019  The left ventricular ejection fraction is moderately decreased (30-44%).  Nuclear stress EF: 39%.  No T wave inversion was noted during stress.  There was no ST segment deviation noted during stress.  Defect 1: There is a medium defect of moderate severity present in the basal inferior, mid inferior and apex location.  Findings consistent with ischemia.  This is an intermediate risk study.   Medium size, moderate intensity reversible (SDS 4) inferior perfusion defect, suggestive of ischemia. LVEF 39%, mildly dilated LV with more prominent inferior hypokinesis. This is an intermediate risk study. Clinical correlation is advised and further testing may be necessary.  Recent Labs: 12/09/2019: ALT 13 12/10/2019: B Natriuretic Peptide 266.9 01/02/2020: BUN 11; Creatinine, Ser 0.67; Hemoglobin 11.3; Magnesium 1.6; Platelets 275; Potassium 3.9; Sodium  142  CBC    Component Value Date/Time   WBC 8.2 01/02/2020 1022   WBC 14.7 (H) 12/11/2019 0230   RBC 4.42 01/02/2020 1022   RBC 4.21 12/11/2019 0230   HGB 11.3 01/02/2020 1022   HCT 35.3 01/02/2020 1022   PLT 275 01/02/2020 1022   MCV 80 01/02/2020 1022   MCH 25.6 (L) 01/02/2020 1022   MCH 25.9 (L) 12/11/2019 0230   MCHC 32.0 01/02/2020 1022   MCHC 30.9 12/11/2019 0230   RDW 14.9 01/02/2020 1022   LYMPHSABS 2.6 12/11/2019 0230   MONOABS 1.9 (H) 12/11/2019 0230   EOSABS 0.0 12/11/2019 0230   BASOSABS 0.0 12/11/2019 0230   CMP Latest Ref Rng & Units 01/02/2020 12/13/2019 12/12/2019  Glucose 65 - 99 mg/dL 865(H) 846(N) 629(B)  BUN 8 - 27 mg/dL 11 28(U) 13(K)  Creatinine 0.57 - 1.00 mg/dL 4.40 1.02(V) 2.53(G)  Sodium 134 - 144 mmol/L 142 142 138  Potassium 3.5 - 5.2 mmol/L 3.9 3.3(L) 3.5  Chloride 96 - 106 mmol/L 102 102 101  CO2 20 - 29 mmol/L 24 29 27   Calcium 8.7 - 10.3 mg/dL 9.5 ) 6.4(Q)  Total Protein 6.5 - 8.1 g/dL - - -  Total Bilirubin 0.3 - 1.2 mg/dL - - -  Alkaline Phos 38 - 126 U/L - - -  AST 15 - 41 U/L - - -  ALT 0 - 44 U/L - - -     Lipid Panel Lab Results  Component Value Date   CHOL 168 11/13/2019   HDL 48 11/13/2019   LDLCALC 101 (H) 11/13/2019   TRIG 97 11/13/2019   CHOLHDL 3.5 11/13/2019      Wt Readings from Last 3 Encounters:  01/23/20 235 lb (106.6 kg)  01/02/20 238 lb (108 kg)  12/30/19 231 lb (104.8 kg)     Other studies Reviewed: Additional studies/ records that were reviewed today include: Office notes, hospital records and testing.  ASSESSMENT AND PLAN:  1.  Abnormal stress test: -She is not having any ongoing ischemic symptoms -She is very anxious about the possibility of a cardiac catheterization. -I discussed the risks and benefits of the cardiac catheterization in detail -  She has decided not to have the procedure -I will add Imdur to her medication regimen and give her prescription for nitroglycerin. -If she gets ischemic  symptoms or changes her mind, she knows to call us immediately  2.  Chronic diastolic CHF -According to the patient, her weight has been stable at home, within a 2 pound range. -Recent labs showed normal renal function on Lasix 80 mg daily -She feels she is working on eating healthier -She is encouraged to continue daily weights and daily Lasix.  3.  Hypertension: -Her blood pressure is up today. -According the patient, she has not taken her blood pressure medications today because of appointments.  This includes amlodipine 10 mg daily, Lasix 80 mg daily and lisinopril 40 mg daily -I believe that her blood pressure will tolerate the addition of Imdur 30 mg daily -Dr. Bjorn Pippin to review and advise on adding beta-blocker  Current medicines are reviewed at length with the patient today.  The patient does not have concerns regarding medicines.  The following changes have been made: Add Imdur and as needed sublingual nitroglycerin  Labs/ tests ordered today include:  No orders of the defined types were placed in this encounter.   Disposition:   FU with Little Ishikawa, MD  Signed, Theodore Demark, PA-C  01/23/2020 4:55 PM    [facility] Phone: [phone]; Fax: [phone]

## 2020-01-24 ENCOUNTER — Telehealth: Payer: Self-pay | Admitting: Physician Assistant

## 2020-01-24 NOTE — Telephone Encounter (Signed)
New Message:   Pt would like to talk to Theodore Demark or a nurse about the medicine she prescribed yesterday.

## 2020-01-24 NOTE — Telephone Encounter (Signed)
Imdur and PRN NTG added yesterday during OV with Rhonda PA  Left message to call back

## 2020-01-24 NOTE — Telephone Encounter (Signed)
Spoke to patient she stated she is confused why she was prescribed Imdur and NTG.Stated she has never had chest pain.Advised recent myoview was abnormal.Stated she thought Bjorn Loser was going to prescribed cholesterol medication.Stated she does not want to take Imdur.Advised I will send message to Theodore Demark PA for advice.

## 2020-01-24 NOTE — Telephone Encounter (Signed)
Patient is returning call.  °

## 2020-01-28 NOTE — Telephone Encounter (Signed)
Patient calling back.   °

## 2020-01-28 NOTE — Telephone Encounter (Signed)
Returned call to patient she stated she is not going to take Imdur or NTG.Stated she is not having any chest pain. Stated she is already on a lot of medications and she can not afford.She stated she is already taking a cholesterol medication Atorvastatin 20 mg.She will keep appointment already scheduled with Dr.Schumann 10/6 at 11:20 am.She will call sooner if she has chest pain.Advised I will make Dr.Schumann aware.

## 2020-02-06 DIAGNOSIS — E119 Type 2 diabetes mellitus without complications: Secondary | ICD-10-CM | POA: Diagnosis not present

## 2020-02-13 ENCOUNTER — Telehealth: Payer: Self-pay

## 2020-02-13 NOTE — Telephone Encounter (Signed)
Noted  

## 2020-02-13 NOTE — Telephone Encounter (Signed)
Paperwork was placed in Dr. Vito Berger box for DM supplies

## 2020-02-14 ENCOUNTER — Telehealth: Payer: Self-pay | Admitting: Cardiology

## 2020-02-14 NOTE — Telephone Encounter (Signed)
Patient has questions regarding the heart catheterization process. She states she would like a call back to further discuss.

## 2020-02-14 NOTE — Telephone Encounter (Signed)
Left message to call office

## 2020-02-19 NOTE — Telephone Encounter (Signed)
Returned call to patient regarding her questions about the heart catheterization process. Patient has a lot questions and concerns regarding procedure. Patient wants to know if the procedure is painful, wants to know if mini strokes can happen before after or during procedure, wants to know about possible IV placement, and wants to know if the Dr. will discuss with her before placing any stents if indicated. Patient stated she wanted a second opinion, but that she would like to see Dr. Bjorn Pippin and speak with him regarding her questions and concerns about the procedure prior to rescheduling it. Advised patient I would forward message to Dr. Bjorn Pippin for review.

## 2020-02-19 NOTE — Telephone Encounter (Signed)
Can we schedule appointment in next few weeks to discuss further?

## 2020-02-21 NOTE — Telephone Encounter (Signed)
Left message to call back  

## 2020-02-26 ENCOUNTER — Telehealth: Payer: Self-pay | Admitting: Internal Medicine

## 2020-02-26 NOTE — Telephone Encounter (Signed)
Noted  

## 2020-02-26 NOTE — Telephone Encounter (Signed)
Patient requests to be called at ph# [phone] re: status of paper work/forms that patient dropped off for Dr. Lonzo Cloud to sign on 02/06/20 for patient's DM supplies.

## 2020-02-26 NOTE — Telephone Encounter (Signed)
On 02/13/20, L. Walker,CMA reports that she placed this paperwork on your desk. Do you recall getting this?

## 2020-03-01 ENCOUNTER — Other Ambulatory Visit: Payer: Self-pay

## 2020-03-01 ENCOUNTER — Ambulatory Visit (HOSPITAL_COMMUNITY)
Admission: EM | Admit: 2020-03-01 | Discharge: 2020-03-01 | Disposition: A | Discharge status: Home / Self Care | Payer: Medicaid Other | Attending: Family Medicine | Admitting: Family Medicine

## 2020-03-01 ENCOUNTER — Encounter (HOSPITAL_COMMUNITY): Payer: Self-pay | Admitting: Emergency Medicine

## 2020-03-01 DIAGNOSIS — M25561 Pain in right knee: Secondary | ICD-10-CM

## 2020-03-01 MED ORDER — DICLOFENAC SODIUM 75 MG PO TBEC: 75.0000 mg | DELAYED_RELEASE_TABLET | Freq: Two times a day (BID) | ORAL | 0 refills | Status: DC

## 2020-03-01 NOTE — ED Provider Notes (Signed)
[facility]   [id number] 03/01/20 Arrival Time: 1604  CZ:YSAYT PAIN  SUBJECTIVE: History from: patient. Whitney Sloan is a 64 y.o. female complains of right knee pain that began actually a couple of months ago.  Reports that she was seen in this office and was given diclofenac to take twice a day.  Reports that this helped while she had the medication.  Reports that she has since run out of the medication.  She was not ever able to follow-up with anyone about her knee pain.  Reports that she thought she did not need to because the pain had resolved with the medication.  Reports that the knee pain is pretty constant and is limiting her mobility. Denies fever, chills, erythema, ecchymosis, effusion, weakness, numbness and tingling, saddle paresthesias, loss of bowel or bladder function.      ROS: As per HPI.  All other pertinent ROS negative.     Past Medical History:  Diagnosis Date  . Abnormal stress test 12/31/2019   medium defect of moderate severity present in the basal inferior, mid inferior and apex location.  Findings consistent with ischemia  . Chronic diastolic (congestive) heart failure (HCC)   . Diabetes mellitus   . Hyperlipemia   . Hypertension   . Psoriasis    Past Surgical History:  Procedure Laterality Date  . ANAL FISSURE REPAIR    . ENDOMETRIAL ABLATION     Allergies  Allergen Reactions  . Oxycodone-Acetaminophen Nausea And Vomiting   Current Facility-Administered Medications on File Prior to Encounter  Medication Dose Route Frequency Provider Last Rate Last Admin  . sodium chloride flush (NS) 0.9 % injection 3 mL  3 mL Intravenous Q12H Little Ishikawa, MD       Current Outpatient Medications on File Prior to Encounter  Medication Sig Dispense Refill  . amLODipine (NORVASC) 10 MG tablet Take 1 tablet (10 mg total) by mouth daily. 30 tablet 1  . aspirin (ASPIR-LOW) 81 MG EC tablet Take 81 mg by mouth every morning.     Marland Kitchen atorvastatin  (LIPITOR) 20 MG tablet Take 1 tablet (20 mg total) by mouth at bedtime. 30 tablet 1  . Cholecalciferol (VITAMIN D PO) Take 1 capsule by mouth daily.    . clotrimazole (GYNE-LOTRIMIN) 1 % vaginal cream Place 1 Applicatorful vaginally daily as needed (yeast).    . ezetimibe (ZETIA) 10 MG tablet Take 10 mg by mouth at bedtime.    . furosemide (LASIX) 80 MG tablet Take 1 tablet (80 mg total) by mouth daily. 90 tablet 3  . ibuprofen (ADVIL) 200 MG tablet Take 400 mg by mouth every 4 (four) hours as needed for fever, headache or moderate pain.     Marland Kitchen insulin aspart (NOVOLOG FLEXPEN) 100 UNIT/ML FlexPen Inject 16 Units into the skin 3 (three) times daily with meals. 30 mL 6  . insulin glargine (LANTUS SOLOSTAR) 100 UNIT/ML Solostar Pen Inject 40 Units into the skin daily. 30 mL 3  . isosorbide mononitrate (IMDUR) 30 MG 24 hr tablet Take 1 tablet (30 mg total) by mouth daily. 30 tablet 3  . lisinopril (ZESTRIL) 40 MG tablet Take 1 tablet (40 mg total) by mouth daily. 30 tablet 1  . magnesium oxide (MAG-OX) 400 MG tablet Take 1 tablet (400 mg total) by mouth daily. 30 tablet 3  . metFORMIN (GLUCOPHAGE) 500 MG tablet Take 500 mg by mouth 2 (two) times daily.     . nitroGLYCERIN (NITROSTAT) 0.4 MG SL tablet Place 1  tablet (0.4 mg total) under the tongue every 5 (five) minutes as needed for chest pain. 25 tablet 4  . nystatin (NYSTATIN) powder Apply 1 application topically daily as needed (yeast).    . polyethylene glycol (MIRALAX / GLYCOLAX) 17 g packet Take 17 g by mouth daily.    . potassium chloride SA (KLOR-CON) 20 MEQ tablet Take 1 tablet (20 mEq total) by mouth daily. 30 tablet 3  . Sennosides (EX-LAX) 15 MG TABS Take 15 mg by mouth daily as needed (constipation).    . tamsulosin (FLOMAX) 0.4 MG CAPS capsule Take 0.4 mg by mouth daily.    Marland Kitchen UNIFINE PENTIPS 31G X 6 MM MISC USE TO INJECT INSULIN 100 each PRN   Social History   Socioeconomic History  . Marital status: Divorced    Spouse name: Not on  file  . Number of children: Not on file  . Years of education: Not on file  . Highest education level: Not on file  Occupational History  . Occupation: disabled  Tobacco Use  . Smoking status: Never Smoker  . Smokeless tobacco: Never Used  Substance and Sexual Activity  . Alcohol use: No  . Drug use: No  . Sexual activity: Not on file  Other Topics Concern  . Not on file  Social History Narrative  . Not on file   Social Determinants of Health   Financial Resource Strain:   . Difficulty of Paying Living Expenses: Not on file  Food Insecurity:   . Worried About Programme researcher, broadcasting/film/video in the Last Year: Not on file  . Ran Out of Food in the Last Year: Not on file  Transportation Needs:   . Lack of Transportation (Medical): Not on file  . Lack of Transportation (Non-Medical): Not on file  Physical Activity:   . Days of Exercise per Week: Not on file  . Minutes of Exercise per Session: Not on file  Stress:   . Feeling of Stress : Not on file  Social Connections:   . Frequency of Communication with Friends and Family: Not on file  . Frequency of Social Gatherings with Friends and Family: Not on file  . Attends Religious Services: Not on file  . Active Member of Clubs or Organizations: Not on file  . Attends Banker Meetings: Not on file  . Marital Status: Not on file  Intimate Partner Violence:   . Fear of Current or Ex-Partner: Not on file  . Emotionally Abused: Not on file  . Physically Abused: Not on file  . Sexually Abused: Not on file   Family History  Problem Relation Age of Onset  . Dementia Mother   . Renal Disease Father   . Diabetes Other     OBJECTIVE:  Vitals:   03/01/20 1643  BP: (!) 146/70  Pulse: 92  Resp: 20  Temp: 98.3 F (36.8 C)  TempSrc: Oral  SpO2: 99%    General appearance: ALERT; in no acute distress.  Head: NCAT Lungs: Normal respiratory effort CV: pulses 2+ bilaterally. Cap refill < 2  seconds Musculoskeletal:  Inspection: Skin warm, dry, clear and intact without obvious erythema, effusion, or ecchymosis.  Palpation: Medial aspect of the right knee is warm, swollen and tender to palpation ROM: Limited ROM active and passive to right knee Stability: Anterior/ posterior drawer intact Skin: warm and dry Neurologic: Ambulates without difficulty; Sensation intact about the upper/ lower extremities Psychological: alert and cooperative; normal mood and affect  DIAGNOSTIC STUDIES:  No results found.   ASSESSMENT & PLAN:  1. Acute pain of right knee     Meds ordered this encounter  Medications  . diclofenac (VOLTAREN) 75 MG EC tablet    Sig: Take 1 tablet (75 mg total) by mouth 2 (two) times daily.    Dispense:  60 tablet    Refill:  0    Order Specific Question:   Supervising Provider    Answer:   Merrilee Jansky [id number]   Prescribed diclofenac Continue conservative management of rest, ice, and gentle stretches  Follow up with PCP if symptoms persist Return or go to the ER if you have any new or worsening symptoms (fever, chills, chest pain, abdominal pain, changes in bowel or bladder habits, pain radiating into lower legs)    Reviewed expectations re: course of current medical issues. Questions answered. Outlined signs and symptoms indicating need for more acute intervention. Patient verbalized understanding. After Visit Summary given.       Moshe Cipro, NP 03/01/20 1845

## 2020-03-01 NOTE — Discharge Instructions (Signed)
I have refilled the diclofenac for you to take twice a day  I placed a referral for you to be seen at the sports medicine center as well  If they do not call you within the next week, give them a call  Follow-up with primary care as needed

## 2020-03-01 NOTE — ED Triage Notes (Signed)
Pt presents with right knee pain. States has been seen before and pain is not getting better.

## 2020-03-05 NOTE — Telephone Encounter (Signed)
Appointment scheduled with Dr. Bjorn Pippin 10/6. MD out of office next week.

## 2020-03-06 ENCOUNTER — Telehealth (HOSPITAL_COMMUNITY): Payer: Self-pay | Admitting: Urgent Care

## 2020-03-06 MED ORDER — DICLOFENAC SODIUM 3 % EX GEL: 2.0000 g | Freq: Two times a day (BID) | CUTANEOUS | 0 refills | Status: DC | PRN

## 2020-03-06 NOTE — Telephone Encounter (Signed)
Medicaid does not cover diclofenac tablets.  We will send for diclofenac gel.

## 2020-03-18 ENCOUNTER — Other Ambulatory Visit: Payer: Self-pay

## 2020-03-18 ENCOUNTER — Encounter: Payer: Self-pay | Admitting: Cardiology

## 2020-03-18 ENCOUNTER — Ambulatory Visit (INDEPENDENT_AMBULATORY_CARE_PROVIDER_SITE_OTHER): Payer: Medicaid Other | Admitting: Cardiology

## 2020-03-18 VITALS — BP 122/78 | HR 94 | Ht 65.0 in | Wt 247.0 lb

## 2020-03-18 DIAGNOSIS — R079 Chest pain, unspecified: Secondary | ICD-10-CM

## 2020-03-18 DIAGNOSIS — E785 Hyperlipidemia, unspecified: Secondary | ICD-10-CM

## 2020-03-18 DIAGNOSIS — R9439 Abnormal result of other cardiovascular function study: Secondary | ICD-10-CM

## 2020-03-18 DIAGNOSIS — I10 Essential (primary) hypertension: Secondary | ICD-10-CM

## 2020-03-18 DIAGNOSIS — I5032 Chronic diastolic (congestive) heart failure: Secondary | ICD-10-CM | POA: Diagnosis not present

## 2020-03-18 MED ORDER — FUROSEMIDE 80 MG PO TABS
80.0000 mg | ORAL_TABLET | Freq: Two times a day (BID) | ORAL | 3 refills | Status: DC
Start: 2020-03-18 — End: 2020-12-18

## 2020-03-18 MED ORDER — METOPROLOL TARTRATE 100 MG PO TABS
ORAL_TABLET | ORAL | 0 refills | Status: DC
Start: 2020-03-18 — End: 2020-12-18

## 2020-03-18 NOTE — Progress Notes (Signed)
Cardiology Office Note:    Date:  03/18/2020   ID:  [name], DOB [birth year], MRN [id number]  PCP:  Boyce Medici, FNP  Cardiologist:  Donato Heinz, MD  Electrophysiologist:  None   Referring MD: Boyce Medici, FNP   Chief Complaint  Patient presents with  . Congestive Heart Failure    History of Present Illness:    Whitney Sloan is a 64 y.o. female with a hx of chronic diastolic heart failure, B0JG, hypertension, hyperlipidemia, obesity, CVA in 2011  who presents for follow-up.  She had admission to [facility] for diastolic heart failure exacerbation on 11/08/2019.  She was in respiratory distress and started on BiPAP and nitroglycerin drip on admission.  TTE showed LVEF 50 to 28%, grade 2 diastolic function, normal RV function, no significant valvular disease.  She was diuresed with IV Lasix, was net negative over 10 L during admission.  She was discharged on Lasix 40 mg daily, lisinopril 40 mg daily, amlodipine 10 mg daily.  At follow-up appointment on 12/02/2019, she had gained 10 pounds and reported dyspnea with minimal exertion.  Lasix was increased to 80 mg twice daily.  She also reported she had been having chest pain 2-3 times per week.  Describes a squeezing pain in her left upper chest.  Lasts few minutes and resolves.  No clear relationship with exertion.  Underwent Lexiscan Myoview on 12/31/2019, which showed inferior ischemia, EF 39%.  Cardiac catheterization was recommended, but patient declined.  Since last clinic visit, she reports that she has been having knee pain.  She is being evaluated by orthopedics, may need surgery.  Taking Lasix 80 mg once daily.  Weight is up 12 pounds.  She denies any chest pain or dyspnea.  Reports persistent lower extremity edema.  Denies any lightheadedness, syncope, or palpitations.   Wt Readings from Last 3 Encounters:  03/18/20 247 lb (112 kg)  01/23/20 235 lb (106.6 kg)  01/02/20 238 lb (108 kg)     Past Medical History:    Diagnosis Date  . Abnormal stress test 12/31/2019   medium defect of moderate severity present in the basal inferior, mid inferior and apex location.  Findings consistent with ischemia  . Chronic diastolic (congestive) heart failure ([place])   . Diabetes mellitus   . Hyperlipemia   . Hypertension   . Psoriasis     Past Surgical History:  Procedure Laterality Date  . ANAL FISSURE REPAIR    . ENDOMETRIAL ABLATION      Current Medications: Current Meds  Medication Sig  . amLODipine (NORVASC) 10 MG tablet Take 1 tablet (10 mg total) by mouth daily.  Marland Kitchen aspirin (ASPIR-LOW) 81 MG EC tablet Take 81 mg by mouth every morning.   Marland Kitchen atorvastatin (LIPITOR) 20 MG tablet Take 1 tablet (20 mg total) by mouth at bedtime.  . Cholecalciferol (VITAMIN D PO) Take 1 capsule by mouth daily.  . clotrimazole (GYNE-LOTRIMIN) 1 % vaginal cream Place 1 Applicatorful vaginally daily as needed (yeast).  Marland Kitchen diclofenac (VOLTAREN) 75 MG EC tablet Take 1 tablet (75 mg total) by mouth 2 (two) times daily.  . Diclofenac Sodium 3 % GEL Apply 2 g topically 2 (two) times daily as needed.  . ezetimibe (ZETIA) 10 MG tablet Take 10 mg by mouth at bedtime.  . furosemide (LASIX) 80 MG tablet Take 1 tablet (80 mg total) by mouth 2 (two) times daily.  Marland Kitchen ibuprofen (ADVIL) 200 MG tablet Take 400 mg by mouth every  4 (four) hours as needed for fever, headache or moderate pain.   Marland Kitchen insulin aspart (NOVOLOG FLEXPEN) 100 UNIT/ML FlexPen Inject 16 Units into the skin 3 (three) times daily with meals.  . insulin glargine (LANTUS SOLOSTAR) 100 UNIT/ML Solostar Pen Inject 40 Units into the skin daily.  . isosorbide mononitrate (IMDUR) 30 MG 24 hr tablet Take 1 tablet (30 mg total) by mouth daily.  Marland Kitchen lisinopril (ZESTRIL) 40 MG tablet Take 1 tablet (40 mg total) by mouth daily.  . magnesium oxide (MAG-OX) 400 MG tablet Take 1 tablet (400 mg total) by mouth daily.  . metFORMIN (GLUCOPHAGE) 500 MG tablet Take 500 mg by mouth 2 (two) times daily.    . nitroGLYCERIN (NITROSTAT) 0.4 MG SL tablet Place 1 tablet (0.4 mg total) under the tongue every 5 (five) minutes as needed for chest pain.  Marland Kitchen nystatin (NYSTATIN) powder Apply 1 application topically daily as needed (yeast).  . polyethylene glycol (MIRALAX / GLYCOLAX) 17 g packet Take 17 g by mouth daily.  . potassium chloride SA (KLOR-CON) 20 MEQ tablet Take 1 tablet (20 mEq total) by mouth daily.  . Sennosides (EX-LAX) 15 MG TABS Take 15 mg by mouth daily as needed (constipation).  . tamsulosin (FLOMAX) 0.4 MG CAPS capsule Take 0.4 mg by mouth daily.  Marland Kitchen UNIFINE PENTIPS 31G X 6 MM MISC USE TO INJECT INSULIN  . [DISCONTINUED] furosemide (LASIX) 80 MG tablet Take 1 tablet (80 mg total) by mouth daily.   Current Facility-Administered Medications for the 03/18/20 encounter (Office Visit) with Donato Heinz, MD  Medication  . sodium chloride flush (NS) 0.9 % injection 3 mL     Allergies:   Oxycodone-acetaminophen   Social History   Socioeconomic History  . Marital status: Divorced    Spouse name: Not on file  . Number of children: Not on file  . Years of education: Not on file  . Highest education level: Not on file  Occupational History  . Occupation: disabled  Tobacco Use  . Smoking status: Never Smoker  . Smokeless tobacco: Never Used  Substance and Sexual Activity  . Alcohol use: No  . Drug use: No  . Sexual activity: Not on file  Other Topics Concern  . Not on file  Social History Narrative  . Not on file   Social Determinants of Health   Financial Resource Strain:   . Difficulty of Paying Living Expenses: Not on file  Food Insecurity:   . Worried About Charity fundraiser in the Last Year: Not on file  . Ran Out of Food in the Last Year: Not on file  Transportation Needs:   . Lack of Transportation (Medical): Not on file  . Lack of Transportation (Non-Medical): Not on file  Physical Activity:   . Days of Exercise per Week: Not on file  . Minutes of  Exercise per Session: Not on file  Stress:   . Feeling of Stress : Not on file  Social Connections:   . Frequency of Communication with Friends and Family: Not on file  . Frequency of Social Gatherings with Friends and Family: Not on file  . Attends Religious Services: Not on file  . Active Member of Clubs or Organizations: Not on file  . Attends Archivist Meetings: Not on file  . Marital Status: Not on file     Family History: The patient's family history includes Dementia in her mother; Diabetes in an other family member; Renal Disease in her  father.  ROS:   Please see the history of present illness.     All other systems reviewed and are negative.  EKGs/Labs/Other Studies Reviewed:    The following studies were reviewed today:   EKG:  EKG is ordered today.  The ekg ordered today demonstrates normal sinus rhythm, rate 94, low voltage, nonspecific T wave flattening  Recent Labs: 12/09/2019: ALT 13 12/10/2019: B Natriuretic Peptide 266.9 01/02/2020: BUN 11; Creatinine, Ser 0.67; Hemoglobin 11.3; Magnesium 1.6; Platelets 275; Potassium 3.9; Sodium 142  Recent Lipid Panel    Component Value Date/Time   CHOL 168 11/13/2019 0218   TRIG 97 11/13/2019 0218   HDL 48 11/13/2019 0218   CHOLHDL 3.5 11/13/2019 0218   VLDL 19 11/13/2019 0218   LDLCALC 101 (H) 11/13/2019 0218    Physical Exam:    VS:  BP 122/78   Pulse 94   Ht _0  (1.651 m)   Wt 247 lb (112 kg)   SpO2 97%   BMI 41.10 kg/m     Wt Readings from Last 3 Encounters:  03/18/20 247 lb (112 kg)  01/23/20 235 lb (106.6 kg)  01/02/20 238 lb (108 kg)     GEN: in no acute distress HEENT: Normal NECK: No JVD appreciated but difficult to assess given body CARDIAC: RRR, no murmurs, rubs, gallops RESPIRATORY:  Clear to auscultation without rales, wheezing or rhonchi  ABDOMEN: Soft, non-tender, non-distended MUSCULOSKELETAL:  2+ BLE edema SKIN: Warm and dry NEUROLOGIC:  Alert and oriented x  3 PSYCHIATRIC:  Normal affect   ASSESSMENT:    1. Abnormal nuclear stress test   2. Chest pain of uncertain etiology   3. Chronic diastolic CHF (congestive heart failure) ([place])   4. Hypertension, unspecified type   5. Hyperlipidemia, unspecified hyperlipidemia type    PLAN:    Chest pain: Atypical in description, but does have significant CAD risk factors (diabetes, hypertension, hyperlipidemia).  Lexiscan Myoview shows inferior ischemia, EF 39%.  LHC recommended, but patient declines.  Reports chest pain has improved but as she may need surgery for her knee, would recommend further evaluation for ischemia.  She declines catheterization at this time, but willing to undergo coronary CTA.  Will give 100 mg metoprolol prior to exam.  Chronic diastolic heart failure: TTE showed EF 50-55% with G2DD.  Was diuresed over 10 L during admission in May.  Discharged on Lasix 40 mg daily.  Gained 10 pounds at f/u appointment 6/21, Lasix dose was increased to 80 mg twice daily with improvement in symptoms.  Decreased back to 80 mg daily after recent hospitalization.  Weight is up 12 nouns and with 2+ lower extremity edema.  Will increase Lasix back to 80 mg twice daily.  Will check BMP, magnesium  Hypertension: Continue lisinopril 40 mg daily and amlodipine 10 mg daily.  Appears controlled  HLD: LDL 101 on 11/13/2018.  Started atorvastatin 20 mg daily  Type 2 diabetes: On insulin.  A1c 10.1  RTC in 1 month  Medication Adjustments/Labs and Tests Ordered: Current medicines are reviewed at length with the patient today.  Concerns regarding medicines are outlined above.  Orders Placed This Encounter  Procedures  . CT CORONARY MORPH W/CTA COR W/SCORE W/CA W/CM &/OR WO/CM  . CT CORONARY FRACTIONAL FLOW RESERVE DATA PREP  . CT CORONARY FRACTIONAL FLOW RESERVE FLUID ANALYSIS  . Basic metabolic panel  . Magnesium   Meds ordered this encounter  Medications  . metoprolol tartrate (LOPRESSOR) 100 MG  tablet  Sig: Take 100 mg (1 tablet) TWO hours prior to CT    Dispense:  1 tablet    Refill:  0  . furosemide (LASIX) 80 MG tablet    Sig: Take 1 tablet (80 mg total) by mouth 2 (two) times daily.    Dispense:  180 tablet    Refill:  3    Dose change    Patient Instructions  Medication Instructions:  INCREASE furosemide (Lasix) to 80 mg two times daily  *If you need a refill on your cardiac medications before your next appointment, please call your pharmacy*   Lab Work: BMET, Mag today  If you have labs (blood work) drawn today and your tests are completely normal, you will receive your results only by: Marland Kitchen MyChart Message (if you have MyChart) OR . A paper copy in the mail If you have any lab test that is abnormal or we need to change your treatment, we will call you to review the results.   Testing/Procedures: Coronary CTA-see instructions below  Follow-Up: At [facility], you and your health needs are our priority.  As part of our continuing mission to provide you with exceptional heart care, we have created designated Provider Care Teams.  These Care Teams include your primary Cardiologist (physician) and Advanced Practice Providers (APPs -  Physician Assistants and Nurse Practitioners) who all work together to provide you with the care you need, when you need it.  We recommend signing up for the patient portal called "MyChart".  Sign up information is provided on this After Visit Summary.  MyChart is used to connect with patients for Virtual Visits (Telemedicine).  Patients are able to view lab/test results, encounter notes, upcoming appointments, etc.  Non-urgent messages can be sent to your provider as well.   To learn more about what you can do with MyChart, go to [email or web address].    Your next appointment:   1 month(s)  The format for your next appointment:   In Person  Provider:   Oswaldo Milian, MD   Other Instructions   Your cardiac CT will  be scheduled at one of the below locations:   [facility] [address] [place], [place] [address] [phone]  [place] [address] [place], [place] [address] [phone]  If scheduled at [facility], please arrive at the [facility] main entrance of [facility] 30 minutes prior to test start time. Proceed to the [facility] Radiology Department (first floor) to check-in and test prep.  If scheduled at [facility], please arrive 15 mins early for check-in and test prep.  Please follow these instructions carefully (unless otherwise directed):  On the Night Before the Test: . Be sure to Drink plenty of water. . Do not consume any caffeinated/decaffeinated beverages or chocolate 12 hours prior to your test. . Do not take any antihistamines 12 hours prior to your test.  On the Day of the Test: . Drink plenty of water. Do not drink any water within one hour of the test. . Do not eat any food 4 hours prior to the test. . You may take your regular medications prior to the test.  . Take metoprolol (Lopressor) two hours prior to test. . HOLD Furosemide/Hydrochlorothiazide morning of the test. . FEMALES- please wear underwire-free bra if available       After the Test: . Drink plenty of water. . After receiving IV contrast, you may  experience a mild flushed feeling. This is normal. . On occasion, you may experience a mild rash up to 24 hours after the test. This is not dangerous. If this occurs, you can take Benadryl 25 mg and increase your fluid intake. . If you experience trouble breathing, this can be serious. If it is severe call 911 IMMEDIATELY. If it is mild, please call our office. . If you take any of these medications: Glipizide/Metformin, Avandament, Glucavance, please do not take 48 hours after completing test unless otherwise instructed.   Once we have  confirmed authorization from your insurance company, we will call you to set up a date and time for your test. Based on how quickly your insurance processes prior authorizations requests, please allow up to 4 weeks to be contacted for scheduling your Cardiac CT appointment. Be advised that routine Cardiac CT appointments could be scheduled as many as 8 weeks after your provider has ordered it.  For non-scheduling related questions, please contact the cardiac imaging nurse navigator should you have any questions/concerns: Marchia Bond, Cardiac Imaging Nurse Navigator Burley Saver, Interim Cardiac Imaging Nurse [place] [facility] Direct Office Dial: [phone]   For scheduling needs, including cancellations and rescheduling, please call Vivien Rota at [phone], option 3.        Signed, Donato Heinz, MD  03/18/2020 5:21 PM    [place]

## 2020-03-18 NOTE — Patient Instructions (Signed)
Medication Instructions:  INCREASE furosemide (Lasix) to 80 mg two times daily  *If you need a refill on your cardiac medications before your next appointment, please call your pharmacy*   Lab Work: BMET, Mag today  If you have labs (blood work) drawn today and your tests are completely normal, you will receive your results only by: Marland Kitchen MyChart Message (if you have MyChart) OR . A paper copy in the mail If you have any lab test that is abnormal or we need to change your treatment, we will call you to review the results.   Testing/Procedures: Coronary CTA-see instructions below  Follow-Up: At [facility], you and your health needs are our priority.  As part of our continuing mission to provide you with exceptional heart care, we have created designated Provider Care Teams.  These Care Teams include your primary Cardiologist (physician) and Advanced Practice Providers (APPs -  Physician Assistants and Nurse Practitioners) who all work together to provide you with the care you need, when you need it.  We recommend signing up for the patient portal called "MyChart".  Sign up information is provided on this After Visit Summary.  MyChart is used to connect with patients for Virtual Visits (Telemedicine).  Patients are able to view lab/test results, encounter notes, upcoming appointments, etc.  Non-urgent messages can be sent to your provider as well.   To learn more about what you can do with MyChart, go to [email or web address].    Your next appointment:   1 month(s)  The format for your next appointment:   In Person  Provider:   Epifanio Lesches, MD   Other Instructions   Your cardiac CT will be scheduled at one of the below locations:   [facility] [address] [place], [place] [address] [phone]  OR  [facility] [address] [place], [place] [address] [phone]  If scheduled at [facility], please arrive at the [facility] main entrance of [facility] 30 minutes prior to test start time. Proceed to the [facility] Radiology Department (first floor) to check-in and test prep.  If scheduled at [facility], please arrive 15 mins early for check-in and test prep.  Please follow these instructions carefully (unless otherwise directed):  On the Night Before the Test: . Be sure to Drink plenty of water. . Do not consume any caffeinated/decaffeinated beverages or chocolate 12 hours prior to your test. . Do not take any antihistamines 12 hours prior to your test.  On the Day of the Test: . Drink plenty of water. Do not drink any water within one hour of the test. . Do not eat any food 4 hours prior to the test. . You may take your regular medications prior to the test.  . Take metoprolol (Lopressor) two hours prior to test. . HOLD Furosemide/Hydrochlorothiazide morning of the test. . FEMALES- please wear underwire-free bra if available       After the Test: . Drink plenty of water. . After receiving IV contrast, you may experience a mild flushed feeling. This is normal. . On occasion, you may experience a mild rash up to 24 hours after the test. This is not dangerous. If this occurs, you can take Benadryl 25 mg and increase your fluid intake. . If you experience trouble breathing, this can be serious. If it is severe call 911 IMMEDIATELY. If it is mild, please call our office. . If you take  any of these medications: Glipizide/Metformin, Avandament, Glucavance, please do not take 48 hours after completing test unless otherwise instructed.   Once we have confirmed authorization from your insurance company, we will call you to set up a date and time for your test. Based on how quickly your insurance processes prior authorizations requests, please allow up to 4 weeks to be contacted for scheduling your Cardiac CT appointment. Be advised that  routine Cardiac CT appointments could be scheduled as many as 8 weeks after your provider has ordered it.  For non-scheduling related questions, please contact the cardiac imaging nurse navigator should you have any questions/concerns: Marchia Bond, Cardiac Imaging Nurse Navigator Burley Saver, Interim Cardiac Imaging Nurse [place] [facility] Direct Office Dial: [phone]   For scheduling needs, including cancellations and rescheduling, please call Vivien Rota at [phone], option 3.

## 2020-03-19 LAB — BASIC METABOLIC PANEL
BUN/Creatinine Ratio: 20 (ref 12–28)
BUN: 13 mg/dL (ref 8–27)
CO2: 23 mmol/L (ref 20–29)
Calcium: 9.2 mg/dL (ref 8.7–10.3)
Chloride: 102 mmol/L (ref 96–106)
Creatinine, Ser: 0.64 mg/dL (ref 0.57–1.00)
GFR calc Af Amer: 109 mL/min/{1.73_m2} (ref >59)
GFR calc non Af Amer: 95 mL/min/{1.73_m2} (ref >59)
Glucose: 197 mg/dL — ABNORMAL HIGH (ref 65–99)
Potassium: 4.5 mmol/L (ref 3.5–5.2)
Sodium: 143 mmol/L (ref 134–144)

## 2020-03-19 LAB — MAGNESIUM: Magnesium: 1.9 mg/dL (ref 1.6–2.3)

## 2020-03-19 NOTE — Addendum Note (Signed)
Addended by: Myna Hidalgo A on: 03/19/2020 10:52 AM   Modules accepted: Orders

## 2020-03-20 ENCOUNTER — Telehealth: Payer: Self-pay | Admitting: Cardiology

## 2020-03-20 DIAGNOSIS — I10 Essential (primary) hypertension: Secondary | ICD-10-CM

## 2020-03-20 DIAGNOSIS — I5032 Chronic diastolic (congestive) heart failure: Secondary | ICD-10-CM

## 2020-03-20 NOTE — Telephone Encounter (Signed)
Spoke to patient lab results given.Advised to repeat bmet and magnesium in 1 week 03/27/20.Stated she did not want to have lab work in our office.Stated she has a knot on her arm were blood was drawn.Stated no pain.Advised this happens sometimes.Lab appointment scheduled at our [facility] office 03/27/20.Advised she can go there 8:00 am to 12:00 or 2:00 pm to 4:00 pm.She does not have to fast.She had multiple questions.Advised to write down all her questions and bring to next appointment with Dr.Schumann.

## 2020-03-20 NOTE — Telephone Encounter (Signed)
Patient is returning call to discuss results from lab work completed on 03/18/20.

## 2020-03-26 ENCOUNTER — Encounter: Payer: Self-pay | Admitting: Internal Medicine

## 2020-03-26 ENCOUNTER — Ambulatory Visit (INDEPENDENT_AMBULATORY_CARE_PROVIDER_SITE_OTHER): Payer: Medicaid Other | Admitting: Internal Medicine

## 2020-03-26 ENCOUNTER — Other Ambulatory Visit: Payer: Self-pay

## 2020-03-26 VITALS — BP 124/72 | HR 100 | Ht 65.0 in | Wt 246.2 lb

## 2020-03-26 DIAGNOSIS — E1142 Type 2 diabetes mellitus with diabetic polyneuropathy: Secondary | ICD-10-CM

## 2020-03-26 DIAGNOSIS — E1165 Type 2 diabetes mellitus with hyperglycemia: Secondary | ICD-10-CM | POA: Diagnosis not present

## 2020-03-26 DIAGNOSIS — Z794 Long term (current) use of insulin: Secondary | ICD-10-CM

## 2020-03-26 DIAGNOSIS — E1159 Type 2 diabetes mellitus with other circulatory complications: Secondary | ICD-10-CM

## 2020-03-26 LAB — POCT GLYCOSYLATED HEMOGLOBIN (HGB A1C): Hemoglobin A1C: 8.7 % — AB (ref 4.0–5.6)

## 2020-03-26 MED ORDER — NOVOLOG FLEXPEN 100 UNIT/ML ~~LOC~~ SOPN
18.0000 [IU] | PEN_INJECTOR | Freq: Three times a day (TID) | SUBCUTANEOUS | 6 refills | Status: DC
Start: 2020-03-26 — End: 2021-03-10

## 2020-03-26 MED ORDER — METFORMIN HCL 1000 MG PO TABS: 1000.0000 mg | ORAL_TABLET | Freq: Two times a day (BID) | ORAL | 3 refills | Status: DC

## 2020-03-26 MED ORDER — INSULIN PEN NEEDLE 32G X 4 MM MISC: 1.0000 | 3 refills | Status: DC

## 2020-03-26 MED ORDER — LANTUS SOLOSTAR 100 UNIT/ML ~~LOC~~ SOPN
36.0000 [IU] | PEN_INJECTOR | Freq: Every day | SUBCUTANEOUS | 3 refills | Status: DC
Start: 2020-03-26 — End: 2020-10-14

## 2020-03-26 NOTE — Progress Notes (Signed)
Name: Whitney Sloan  Age/ Sex: 64 y.o., female   MRN/ DOB: [id number], 1956-02-18     PCP: Zachery Dauer, FNP   Reason for Endocrinology Evaluation: Type 2 Diabetes Mellitus  Initial Endocrine Consultative Visit: 09/10/2018    PATIENT IDENTIFIER: Whitney Sloan is a 64 y.o. female with a past medical history of HTN, T2DM and Dyslipidemia. The patient has followed with Endocrinology clinic since 09/10/2018 for consultative assistance with management of her diabetes.  DIABETIC HISTORY:  Whitney Sloan was diagnosed with T2DM in 2008, she has been on Metformin, Glipizide, Januvia and Actos in the past.She has been on insulin therapy for years. Her hemoglobin A1c has ranged from 8.0%in 2011, peaking at12.5%in 2010.  On her initial visit to our clinic her A1c was 11.0% , she was on lantus, Invokana, Metfomin and Pioglitazone but she was not taking them. Bydureon was added.   Invokana was stopped by the patient by 06/2019 due to recurrent yeast infections Prandial insulin added 07/2019  SUBJECTIVE:   During the last visit (11/21/2019): A1 c 10.1% .  Patient was non adherent to her medication regimen, no changes were made she was encouraged to start with taking her medications    Today (03/26/2020): Whitney Sloan is here for a follow up on diabetes..  She has been checking glucose 1 a day . The patient has had hypoglycemic episodes since the last clinic visit. Otherwise, the patient has required mergency interventions acute respiratory failure (10/2019)    Denies nausea, diarrhea or sob     HOME DIABETES REGIMEN:   Lantus 40 units daily   Novolog 16 units with each meal  Metformin 1000 mg BID      METER DOWNLOAD SUMMARY: 10/8-10/14/2021 Overall Mean FS Glucose = 182  BG Ranges: Low = 87 High = 358   Hypoglycemic Events/30 Days: BG < 50 = 0 Episodes of symptomatic severe hypoglycemia = 0     HISTORY:  Past Medical History:  Past Medical History:  Diagnosis Date  .  Abnormal stress test 12/31/2019   medium defect of moderate severity present in the basal inferior, mid inferior and apex location.  Findings consistent with ischemia  . Chronic diastolic (congestive) heart failure (HCC)   . Diabetes mellitus   . Hyperlipemia   . Hypertension   . Psoriasis    Past Surgical History:  Past Surgical History:  Procedure Laterality Date  . ANAL FISSURE REPAIR    . ENDOMETRIAL ABLATION      Social History:  reports that she has never smoked. She has never used smokeless tobacco. She reports that she does not drink alcohol and does not use drugs. Family History:  Family History  Problem Relation Age of Onset  . Dementia Mother   . Renal Disease Father   . Diabetes Other      HOME MEDICATIONS: Allergies as of 03/26/2020      Reactions   Oxycodone-acetaminophen Nausea And Vomiting      Medication List       Accurate as of March 26, 2020  9:38 AM. If you have any questions, ask your nurse or doctor.        amLODipine 10 MG tablet Commonly known as: NORVASC Take 1 tablet (10 mg total) by mouth daily.   Aspir-Low 81 MG EC tablet Generic drug: aspirin Take 81 mg by mouth every morning.   atorvastatin 20 MG tablet Commonly known as: LIPITOR Take 1 tablet (20 mg total) by mouth at  bedtime.   clotrimazole 1 % vaginal cream Commonly known as: GYNE-LOTRIMIN Place 1 Applicatorful vaginally daily as needed (yeast).   diclofenac 75 MG EC tablet Commonly known as: VOLTAREN Take 1 tablet (75 mg total) by mouth 2 (two) times daily.   Diclofenac Sodium 3 % Gel Apply 2 g topically 2 (two) times daily as needed.   Ex-Lax 15 MG Tabs Generic drug: Sennosides Take 15 mg by mouth daily as needed (constipation).   ezetimibe 10 MG tablet Commonly known as: ZETIA Take 10 mg by mouth at bedtime.   furosemide 80 MG tablet Commonly known as: LASIX Take 1 tablet (80 mg total) by mouth 2 (two) times daily.   ibuprofen 200 MG tablet Commonly known  as: ADVIL Take 400 mg by mouth every 4 (four) hours as needed for fever, headache or moderate pain.   isosorbide mononitrate 30 MG 24 hr tablet Commonly known as: IMDUR Take 1 tablet (30 mg total) by mouth daily.   Lantus SoloStar 100 UNIT/ML Solostar Pen Generic drug: insulin glargine Inject 40 Units into the skin daily.   lisinopril 40 MG tablet Commonly known as: ZESTRIL Take 1 tablet (40 mg total) by mouth daily.   magnesium oxide 400 MG tablet Commonly known as: MAG-OX Take 1 tablet (400 mg total) by mouth daily.   metFORMIN 500 MG tablet Commonly known as: GLUCOPHAGE Take 500 mg by mouth 2 (two) times daily.   metoprolol tartrate 100 MG tablet Commonly known as: LOPRESSOR Take 100 mg (1 tablet) TWO hours prior to CT   nitroGLYCERIN 0.4 MG SL tablet Commonly known as: NITROSTAT Place 1 tablet (0.4 mg total) under the tongue every 5 (five) minutes as needed for chest pain.   NovoLOG FlexPen 100 UNIT/ML FlexPen Generic drug: insulin aspart Inject 16 Units into the skin 3 (three) times daily with meals.   nystatin powder Generic drug: nystatin Apply 1 application topically daily as needed (yeast).   polyethylene glycol 17 g packet Commonly known as: MIRALAX / GLYCOLAX Take 17 g by mouth daily.   potassium chloride SA 20 MEQ tablet Commonly known as: KLOR-CON Take 1 tablet (20 mEq total) by mouth daily.   tamsulosin 0.4 MG Caps capsule Commonly known as: FLOMAX Take 0.4 mg by mouth daily.   Unifine Pentips 31G X 6 MM Misc Generic drug: Insulin Pen Needle USE TO INJECT INSULIN   VITAMIN D PO Take 1 capsule by mouth daily.        OBJECTIVE:   Vital Signs: BP 124/72 (BP Location: Left Arm, Patient Position: Sitting, Cuff Size: Normal)   Pulse 100   Ht 5\' 5"  (1.651 m)   Wt 246 lb 3.2 oz (111.7 kg)   SpO2 94%   BMI 40.97 kg/m   Wt Readings from Last 3 Encounters:  03/26/20 246 lb 3.2 oz (111.7 kg)  03/18/20 247 lb (112 kg)  01/23/20 235 lb (106.6  kg)     Exam: General: Pt appears well and is in NAD  Lungs: CTA   Heart: RRR  Extremities: 2+  pretibial edema.  Neuro: MS is good with appropriate affect, pt is alert and Ox3    DM Foot Exam 11/21/2019 The skin of the feet is without sores or ulcerations. The pedal pulses undetectable due to edema  The sensation is decreased to a screening 5.07, 10 gram monofilament bilaterally       DATA REVIEWED:  Lab Results  Component Value Date   HGBA1C 8.7 (A) 03/26/2020   HGBA1C  10.1 (H) 11/08/2019   HGBA1C 11.7 (A) 08/07/2019            ASSESSMENT / PLAN / RECOMMENDATIONS:   1) Type2Diabetes Mellitus,Poorlycontrolled, Withneuropathic complications - Most recent A1c of8.7%. Goal A1c <7.0%.   - A1c down from 10.1%  - I have congratulated her on the great work she has done thus far -I have encouraged her to continue with medication adherence - In review of her glucose meter download, she was noted with fasting hypoglycemia but hyperglycemia noted during the day  - Will make the following adjustements   MEDICATIONS: - Decrease  Lantus 36 units daily   - Continue Metformin 1000 mg twice a day  - Increase NovoLog 18 units with each meal  EDUCATION / INSTRUCTIONS:  BG monitoring instructions: Patient is instructed to check her blood sugars 3 times a day, before meals  Call [facility] if: BG persistently < 70  . I reviewed the Rule of 15 for the treatment of hypoglycemia in detail with the patient. Literature supplied.   F/U in 6 months    Signed electronically by: Lyndle Herrlich, MD  [facility] Endocrinology  [facility] [address] [place] [place], [place] [address] Phone: [phone] FAX: [phone]   CC: Zachery Dauer, FNP [address] [place] [address] Phone: [phone]  Fax: [phone]  Return to Endocrinology clinic as below: Future Appointments  Date Time Provider  Department Center  03/27/2020 10:45 AM [facility]  04/21/2020  3:40 PM Little Ishikawa, MD [facility]

## 2020-03-26 NOTE — Patient Instructions (Signed)
-   Decrease Lantus 36 units daily  - Continue Metformin 1000 mg twice a day  - Increase  Novolog 18 units with each meal     HOW TO TREAT LOW BLOOD SUGARS (Blood sugar LESS THAN 70 MG/DL)  Please follow the RULE OF 15 for the treatment of hypoglycemia treatment (when your (blood sugars are less than 70 mg/dL)    STEP 1: Take 15 grams of carbohydrates when your blood sugar is low, which includes:   3-4 GLUCOSE TABS  OR  3-4 OZ OF JUICE OR REGULAR SODA OR  ONE TUBE OF GLUCOSE GEL     STEP 2: RECHECK blood sugar in 15 MINUTES STEP 3: If your blood sugar is still low at the 15 minute recheck --> then, go back to STEP 1 and treat AGAIN with another 15 grams of carbohydrates.

## 2020-03-27 ENCOUNTER — Other Ambulatory Visit: Payer: Medicaid Other

## 2020-04-06 DIAGNOSIS — E119 Type 2 diabetes mellitus without complications: Secondary | ICD-10-CM | POA: Diagnosis not present

## 2020-04-10 ENCOUNTER — Other Ambulatory Visit: Payer: Self-pay

## 2020-04-10 ENCOUNTER — Ambulatory Visit (INDEPENDENT_AMBULATORY_CARE_PROVIDER_SITE_OTHER): Payer: Medicaid Other | Admitting: Family Medicine

## 2020-04-10 VITALS — BP 154/73 | Ht 65.0 in | Wt 230.0 lb

## 2020-04-10 DIAGNOSIS — M25561 Pain in right knee: Secondary | ICD-10-CM | POA: Insufficient documentation

## 2020-04-10 DIAGNOSIS — I5032 Chronic diastolic (congestive) heart failure: Secondary | ICD-10-CM | POA: Diagnosis not present

## 2020-04-10 DIAGNOSIS — R9439 Abnormal result of other cardiovascular function study: Secondary | ICD-10-CM | POA: Diagnosis not present

## 2020-04-10 DIAGNOSIS — R079 Chest pain, unspecified: Secondary | ICD-10-CM | POA: Diagnosis not present

## 2020-04-10 MED ORDER — DICLOFENAC SODIUM 3 % EX GEL: 2.0000 g | Freq: Two times a day (BID) | CUTANEOUS | 1 refills | Status: AC | PRN

## 2020-04-10 NOTE — Progress Notes (Signed)
[facility]: Attending Note: I have reviewed the chart, discussed wit the Sports Medicine Fellow. I agree with assessment and treatment plan as detailed in the Fellow's note. I discussed getting X rays with her as she has not had any in greater than 10 years; they will tell us where she ion the continuum. We dicussed treating with topicals as long as that is effective, but it would be useful info for both her and [place] to have re the current amint of arthritic change she has. She agrees. Will also see her back in 3 months to check in with current treatment plan.

## 2020-04-10 NOTE — Progress Notes (Signed)
    SUBJECTIVE:   CHIEF COMPLAINT / HPI:   Right knee pain [name] is a very pleasant 64 year old female who presents today as a new patient to this clinic for complaint of right knee pain.  She states this most recent episode of right knee pain began at the beginning of August right after she was discharged from the hospital due to having an exacerbation of congestive heart failure.  She states she got up one morning after being discharged and it just hurt and it never got better.  It is worse with with activity especially with motion such as getting up from a chair and she reports the pain being localized over the kneecap.  She reports mild intermittent swelling but nothing consistent.  No warmth or erythema of the area she has noticed.  She has had no falls or trauma to the right knee since the beginning of August.  She states she has had episodes like this in the past however it usually gets better.  She has been to [facility] urgent care several times and gotten diclofenac gel which she says works very well.  She denies any popping or locking of the knee but does feel like it "gives out" at times.  She has not had any recent imaging of her right knee.  PERTINENT  PMH / PSH: Congestive heart failure, hypertension, type 2 diabetes  OBJECTIVE:   BP (!) 154/73   Ht 5\' 5"  (1.651 m)   Wt 230 lb (104.3 kg)   BMI 38.27 kg/m   No flowsheet data found. Knee, Right: Inspection was negative for erythema, ecchymosis, and effusion. No obvious bony abnormalities or signs of osteophyte development. Palpation yielded no asymmetric warmth; Medial joint line tenderness; Positive for patellar tenderness and patellar crepitus. Patellar and quadriceps tendons unremarkable, and no tenderness of the pes anserine bursa. No obvious Baker's cyst development. ROM normal in flexion (135 degrees) and extension (0 degrees). Normal hamstring and quadriceps strength. Neurovascularly intact bilaterally. Special Tests  -  Cruciate Ligaments:   - Anterior Drawer:  NEG - Posterior Drawer: NEG  - Collateral Ligaments:   - Varus/Valgus Stress test: NEG  - Meniscus:   - McMurray's: NEG  - Patella:   - Patellar grind/compression: NEG   ASSESSMENT/PLAN:   Right knee pain Has clinical history, presentation most consistent with acute exacerbation of chronic right knee osteoarthritis.  Last x-rays in 2010 which did show some mild degenerative changes to the right knee.  We will repeat x-rays today and I will call her with results once she gets them. -Refill of diclofenac gel -Patient will follow up in 3 months; I do want to ensure she is responding appropriately to the diclofenac gel.  -Discussed with patient benefits of weight loss, home exercises, and use of Tylenol as needed      2011, DO PGY-4, Sports Medicine Fellow [facility]

## 2020-04-10 NOTE — Patient Instructions (Signed)
It was great to meet you today! Thank you for letting me participate in your care!  Today, we discussed your right knee pain. It is most likely due to osteoarthritis and I have refilled your your diclofenac gel and we will obtain x-rays.  I will call you with the results of your x-rays.  Be well, Jules Schick, DO PGY-4, Sports Medicine Fellow [facility]

## 2020-04-10 NOTE — Assessment & Plan Note (Signed)
Has clinical history, presentation most consistent with acute exacerbation of chronic right knee osteoarthritis.  Last x-rays in 2010 which did show some mild degenerative changes to the right knee.  We will repeat x-rays today and I will call her with results once she gets them. -Refill of diclofenac gel -Patient will follow up in 3 months; I do want to ensure she is responding appropriately to the diclofenac gel.  -Discussed with patient benefits of weight loss, home exercises, and use of Tylenol as needed

## 2020-04-19 NOTE — Progress Notes (Deleted)
Cardiology Office Note:    Date:  04/19/2020   ID:  Whitney LEVITAN, DOB 20-Aug-1955, MRN [id number]  PCP:  Zachery Dauer, FNP  Cardiologist:  Little Ishikawa, MD  Electrophysiologist:  None   Referring MD: Zachery Dauer, FNP   No chief complaint on file.   History of Present Illness:    Whitney Sloan is a 64 y.o. female with a hx of chronic diastolic heart failure, T2DM, hypertension, hyperlipidemia, obesity, CVA in 2011  who presents for follow-up.  She had admission to [facility] for diastolic heart failure exacerbation on 11/08/2019.  She was in respiratory distress and started on BiPAP and nitroglycerin drip on admission.  TTE showed LVEF 50 to 55%, grade 2 diastolic function, normal RV function, no significant valvular disease.  She was diuresed with IV Lasix, was net negative over 10 L during admission.  She was discharged on Lasix 40 mg daily, lisinopril 40 mg daily, amlodipine 10 mg daily.  At follow-up appointment on 12/02/2019, she had gained 10 pounds and reported dyspnea with minimal exertion.  Lasix was increased to 80 mg twice daily.  She also reported she had been having chest pain 2-3 times per week.  Describes a squeezing pain in her left upper chest.  Lasts few minutes and resolves.  No clear relationship with exertion.  Underwent Lexiscan Myoview on 12/31/2019, which showed inferior ischemia, EF 39%.  Cardiac catheterization was recommended, but patient declined.  She was agreeable to coronary CTA, has not been done yet.  Since last clinic visit, she reports that she has been having knee pain.  She is being evaluated by orthopedics, may need surgery.  Taking Lasix 80 mg once daily.  Weight is up 12 pounds.  She denies any chest pain or dyspnea.  Reports persistent lower extremity edema.  Denies any lightheadedness, syncope, or palpitations.   Wt Readings from Last 3 Encounters:  04/10/20 230 lb (104.3 kg)  03/26/20 246 lb 3.2 oz (111.7 kg)  03/18/20 247 lb (112 kg)      Past Medical History:  Diagnosis Date  . Abnormal stress test 12/31/2019   medium defect of moderate severity present in the basal inferior, mid inferior and apex location.  Findings consistent with ischemia  . Chronic diastolic (congestive) heart failure (HCC)   . Diabetes mellitus   . Hyperlipemia   . Hypertension   . Psoriasis     Past Surgical History:  Procedure Laterality Date  . ANAL FISSURE REPAIR    . ENDOMETRIAL ABLATION      Current Medications: No outpatient medications have been marked as taking for the 04/21/20 encounter (Appointment) with Little Ishikawa, MD.   Current Facility-Administered Medications for the 04/21/20 encounter (Appointment) with Little Ishikawa, MD  Medication  . sodium chloride flush (NS) 0.9 % injection 3 mL     Allergies:   Oxycodone-acetaminophen   Social History   Socioeconomic History  . Marital status: Divorced    Spouse name: Not on file  . Number of children: Not on file  . Years of education: Not on file  . Highest education level: Not on file  Occupational History  . Occupation: disabled  Tobacco Use  . Smoking status: Never Smoker  . Smokeless tobacco: Never Used  Substance and Sexual Activity  . Alcohol use: No  . Drug use: No  . Sexual activity: Not on file  Other Topics Concern  . Not on file  Social History Narrative  . Not  on file   Social Determinants of Health   Financial Resource Strain:   . Difficulty of Paying Living Expenses: Not on file  Food Insecurity:   . Worried About Programme researcher, broadcasting/film/video in the Last Year: Not on file  . Ran Out of Food in the Last Year: Not on file  Transportation Needs:   . Lack of Transportation (Medical): Not on file  . Lack of Transportation (Non-Medical): Not on file  Physical Activity:   . Days of Exercise per Week: Not on file  . Minutes of Exercise per Session: Not on file  Stress:   . Feeling of Stress : Not on file  Social Connections:   .  Frequency of Communication with Friends and Family: Not on file  . Frequency of Social Gatherings with Friends and Family: Not on file  . Attends Religious Services: Not on file  . Active Member of Clubs or Organizations: Not on file  . Attends Banker Meetings: Not on file  . Marital Status: Not on file     Family History: The patient's family history includes Dementia in her mother; Diabetes in an other family member; Renal Disease in her father.  ROS:   Please see the history of present illness.     All other systems reviewed and are negative.  EKGs/Labs/Other Studies Reviewed:    The following studies were reviewed today:   EKG:  EKG is ordered today.  The ekg ordered today demonstrates normal sinus rhythm, rate 94, low voltage, nonspecific T wave flattening  Recent Labs: 12/09/2019: ALT 13 12/10/2019: B Natriuretic Peptide 266.9 01/02/2020: Hemoglobin 11.3; Platelets 275 03/18/2020: BUN 13; Creatinine, Ser 0.64; Magnesium 1.9; Potassium 4.5; Sodium 143  Recent Lipid Panel    Component Value Date/Time   CHOL 168 11/13/2019 0218   TRIG 97 11/13/2019 0218   HDL 48 11/13/2019 0218   CHOLHDL 3.5 11/13/2019 0218   VLDL 19 11/13/2019 0218   LDLCALC 101 (H) 11/13/2019 0218    Physical Exam:    VS:  There were no vitals taken for this visit.    Wt Readings from Last 3 Encounters:  04/10/20 230 lb (104.3 kg)  03/26/20 246 lb 3.2 oz (111.7 kg)  03/18/20 247 lb (112 kg)     GEN: in no acute distress HEENT: Normal NECK: No JVD appreciated but difficult to assess given body CARDIAC: RRR, no murmurs, rubs, gallops RESPIRATORY:  Clear to auscultation without rales, wheezing or rhonchi  ABDOMEN: Soft, non-tender, non-distended MUSCULOSKELETAL:  2+ BLE edema SKIN: Warm and dry NEUROLOGIC:  Alert and oriented x 3 PSYCHIATRIC:  Normal affect   ASSESSMENT:    No diagnosis found. PLAN:    Chest pain: Atypical in description, but does have significant CAD risk  factors (diabetes, hypertension, hyperlipidemia).  Lexiscan Myoview shows inferior ischemia, EF 39%.  LHC recommended, but patient declines.  Reports chest pain has improved but as she may need surgery for her knee, would recommend further evaluation for ischemia.  She declines catheterization at this time, but willing to undergo coronary CTA.  Will give 100 mg metoprolol prior to exam.  Chronic diastolic heart failure: TTE showed EF 50-55% with G2DD.  Was diuresed over 10 L during admission in May.  Discharged on Lasix 40 mg daily.  Gained 10 pounds at f/u appointment 6/21, Lasix dose was increased to 80 mg twice daily with improvement in symptoms.  Decreased back to 80 mg daily after recent hospitalization.  Weight is up 12  nouns and with 2+ lower extremity edema.  Increased Lasix back to 80 mg twice daily at last clinic visit, will recheck BMP/magnesium  Hypertension: Continue lisinopril 40 mg daily and amlodipine 10 mg daily.  Appears controlled  HLD: LDL 101 on 11/13/2018.  Started atorvastatin 20 mg daily  Type 2 diabetes: On insulin.  Follows with endocrinology.  A1c 8.7%  RTC in ***  Medication Adjustments/Labs and Tests Ordered: Current medicines are reviewed at length with the patient today.  Concerns regarding medicines are outlined above.  No orders of the defined types were placed in this encounter.  No orders of the defined types were placed in this encounter.   There are no Patient Instructions on file for this visit.   Signed, Little Ishikawa, MD  04/19/2020 10:10 PM    [facility]

## 2020-04-21 ENCOUNTER — Ambulatory Visit: Payer: Medicaid Other | Admitting: Cardiology

## 2020-04-21 ENCOUNTER — Other Ambulatory Visit: Payer: Self-pay | Admitting: *Deleted

## 2020-04-21 DIAGNOSIS — R079 Chest pain, unspecified: Secondary | ICD-10-CM

## 2020-04-21 DIAGNOSIS — I5032 Chronic diastolic (congestive) heart failure: Secondary | ICD-10-CM

## 2020-04-21 DIAGNOSIS — R9439 Abnormal result of other cardiovascular function study: Secondary | ICD-10-CM

## 2020-04-23 ENCOUNTER — Other Ambulatory Visit: Payer: Self-pay

## 2020-04-23 ENCOUNTER — Ambulatory Visit
Admission: RE | Admit: 2020-04-23 | Discharge: 2020-04-23 | Disposition: A | Discharge status: Home / Self Care | Payer: Medicaid Other | Source: Ambulatory Visit | Attending: Family Medicine | Admitting: Family Medicine

## 2020-04-23 DIAGNOSIS — M1711 Unilateral primary osteoarthritis, right knee: Secondary | ICD-10-CM | POA: Diagnosis not present

## 2020-04-23 DIAGNOSIS — M25561 Pain in right knee: Secondary | ICD-10-CM

## 2020-05-01 ENCOUNTER — Encounter: Payer: Self-pay | Admitting: Family Medicine

## 2020-05-01 NOTE — Progress Notes (Signed)
LS Tricompartmental arthritis

## 2020-06-01 DIAGNOSIS — I5030 Unspecified diastolic (congestive) heart failure: Secondary | ICD-10-CM | POA: Diagnosis not present

## 2020-07-09 DIAGNOSIS — I5030 Unspecified diastolic (congestive) heart failure: Secondary | ICD-10-CM | POA: Diagnosis not present

## 2020-07-14 DIAGNOSIS — E119 Type 2 diabetes mellitus without complications: Secondary | ICD-10-CM | POA: Diagnosis not present

## 2020-07-27 DIAGNOSIS — F3342 Major depressive disorder, recurrent, in full remission: Secondary | ICD-10-CM | POA: Diagnosis not present

## 2020-08-07 ENCOUNTER — Ambulatory Visit (HOSPITAL_COMMUNITY)
Admission: EM | Admit: 2020-08-07 | Discharge: 2020-08-07 | Disposition: A | Discharge status: Home / Self Care | Payer: Medicare Other | Attending: Emergency Medicine | Admitting: Emergency Medicine

## 2020-08-07 ENCOUNTER — Encounter (HOSPITAL_COMMUNITY): Payer: Self-pay

## 2020-08-07 ENCOUNTER — Other Ambulatory Visit: Payer: Self-pay

## 2020-08-07 DIAGNOSIS — Z794 Long term (current) use of insulin: Secondary | ICD-10-CM | POA: Diagnosis not present

## 2020-08-07 DIAGNOSIS — I5032 Chronic diastolic (congestive) heart failure: Secondary | ICD-10-CM | POA: Insufficient documentation

## 2020-08-07 DIAGNOSIS — J029 Acute pharyngitis, unspecified: Secondary | ICD-10-CM | POA: Diagnosis not present

## 2020-08-07 DIAGNOSIS — I110 Hypertensive heart disease with heart failure: Secondary | ICD-10-CM | POA: Insufficient documentation

## 2020-08-07 DIAGNOSIS — Z7901 Long term (current) use of anticoagulants: Secondary | ICD-10-CM | POA: Insufficient documentation

## 2020-08-07 DIAGNOSIS — R0602 Shortness of breath: Secondary | ICD-10-CM | POA: Insufficient documentation

## 2020-08-07 DIAGNOSIS — Z7982 Long term (current) use of aspirin: Secondary | ICD-10-CM | POA: Diagnosis not present

## 2020-08-07 DIAGNOSIS — B349 Viral infection, unspecified: Secondary | ICD-10-CM | POA: Diagnosis not present

## 2020-08-07 DIAGNOSIS — I10 Essential (primary) hypertension: Secondary | ICD-10-CM | POA: Diagnosis not present

## 2020-08-07 DIAGNOSIS — E1142 Type 2 diabetes mellitus with diabetic polyneuropathy: Secondary | ICD-10-CM | POA: Insufficient documentation

## 2020-08-07 DIAGNOSIS — Z20822 Contact with and (suspected) exposure to covid-19: Secondary | ICD-10-CM | POA: Insufficient documentation

## 2020-08-07 DIAGNOSIS — Z79899 Other long term (current) drug therapy: Secondary | ICD-10-CM | POA: Diagnosis not present

## 2020-08-07 NOTE — Discharge Instructions (Addendum)
Your COVID test is pending.  You should self quarantine until the test result is back.  Take Tylenol or ibuprofen as needed for fever or discomfort.  Rest and keep yourself hydrated.  Follow-up with your primary care provider if your symptoms are not improving.    Your blood pressure is elevated today at 174/82.  Please have this rechecked by your primary care provider in 1-2 weeks.

## 2020-08-07 NOTE — ED Provider Notes (Signed)
[facility]    CSN: [id number] Arrival date & time: 08/07/20  1815      History   Chief Complaint Chief Complaint  Patient presents with  . Sore Throat  . Congestion    sob  . Shortness of Breath    HPI Whitney Sloan is a 65 y.o. female.  Patient presents with 5-day history of sore throat, congestion, shortness of breath. She is not currently short of breath. She denies fever, chills, rash, cough, vomiting, diarrhea, or other symptoms. No treatments attempted at home. Her medical history includes hypertension, CHF, diabetes, breast CA.  The history is provided by the patient and medical records.    Past Medical History:  Diagnosis Date  . Abnormal stress test 12/31/2019   medium defect of moderate severity present in the basal inferior, mid inferior and apex location.  Findings consistent with ischemia  . Chronic diastolic (congestive) heart failure (HCC)   . Diabetes mellitus   . Hyperlipemia   . Hypertension   . Psoriasis     Patient Active Problem List   Diagnosis Date Noted  . Right knee pain 04/10/2020  . Chest pain of uncertain etiology 12/19/2019  . CAP (community acquired pneumonia) 12/19/2019  . Constipation 12/10/2019  . Cholelithiasis 12/10/2019  . Thyroid nodule 12/10/2019  . Obesity (BMI 30-39.9) 12/10/2019  . Acute on chronic diastolic heart failure (HCC) 11/08/2019  . Acute respiratory failure with hypoxia (HCC) 11/08/2019  . Chronic diastolic CHF (congestive heart failure) (HCC) 11/08/2019  . Hypertensive crisis 11/08/2019  . Mixed diabetic hyperlipidemia associated with type 2 diabetes mellitus (HCC) 11/08/2019  . Type 2 diabetes mellitus with diabetic polyneuropathy, with long-term current use of insulin (HCC) 08/08/2019  . Dyslipidemia 03/23/2010  . DECREASED HEARING, BILATERAL 02/05/2010  . MEMORY LOSS 02/05/2010  . INCONTINENCE 02/05/2010  . NEOPLASM, MALIGNANT, BREAST, HX OF 01/13/2010  . ANXIETY STATE, UNSPECIFIED 12/02/2009   . UNSTEADY GAIT 12/02/2009  . TOE PAIN 09/24/2009  . ANAL OR RECTAL PAIN 08/25/2009  . LEG EDEMA, BILATERAL 08/25/2009  . Uncontrolled type 2 diabetes mellitus with hyperglycemia, with long-term current use of insulin (HCC) 08/06/2009  . Essential hypertension 08/06/2009  . DERMATITIS, ATOPIC 08/06/2009    Past Surgical History:  Procedure Laterality Date  . ANAL FISSURE REPAIR    . ENDOMETRIAL ABLATION      OB History   No obstetric history on file.      Home Medications    Prior to Admission medications   Medication Sig Start Date End Date Taking? Authorizing Provider  amLODipine (NORVASC) 10 MG tablet Take 1 tablet (10 mg total) by mouth daily. 11/16/19   Burnadette Pop, MD  aspirin (ASPIR-LOW) 81 MG EC tablet Take 81 mg by mouth every morning.     [provider]  atorvastatin (LIPITOR) 20 MG tablet Take 1 tablet (20 mg total) by mouth at bedtime. 11/15/19   Burnadette Pop, MD  Cholecalciferol (VITAMIN D PO) Take 1 capsule by mouth daily.    [provider]  clotrimazole (GYNE-LOTRIMIN) 1 % vaginal cream Place 1 Applicatorful vaginally daily as needed (yeast).    [provider]  diclofenac (VOLTAREN) 75 MG EC tablet Take 1 tablet (75 mg total) by mouth 2 (two) times daily. 03/01/20   Moshe Cipro, NP  Diclofenac Sodium 3 % GEL Apply 2 g topically 2 (two) times daily as needed. 04/10/20   Nestor Ramp, MD  ezetimibe (ZETIA) 10 MG tablet Take 10 mg by mouth at  bedtime. 08/21/19   [provider]  furosemide (LASIX) 80 MG tablet Take 1 tablet (80 mg total) by mouth 2 (two) times daily. 03/18/20   Little Ishikawa, MD  ibuprofen (ADVIL) 200 MG tablet Take 400 mg by mouth every 4 (four) hours as needed for fever, headache or moderate pain.     [provider]  insulin aspart (NOVOLOG FLEXPEN) 100 UNIT/ML FlexPen Inject 18 Units into the skin 3 (three) times daily with meals. 03/26/20   Shamleffer, Konrad Dolores, MD  insulin  glargine (LANTUS SOLOSTAR) 100 UNIT/ML Solostar Pen Inject 36 Units into the skin daily. 03/26/20   Shamleffer, Konrad Dolores, MD  Insulin Pen Needle 32G X 4 MM MISC 1 Device by Does not apply route as directed. 03/26/20   Shamleffer, Konrad Dolores, MD  isosorbide mononitrate (IMDUR) 30 MG 24 hr tablet Take 1 tablet (30 mg total) by mouth daily. 01/23/20 04/22/20  Barrett, Joline Salt, PA-C  lisinopril (ZESTRIL) 40 MG tablet Take 1 tablet (40 mg total) by mouth daily. 11/15/19   Burnadette Pop, MD  magnesium oxide (MAG-OX) 400 MG tablet Take 1 tablet (400 mg total) by mouth daily. 01/03/20   Little Ishikawa, MD  metFORMIN (GLUCOPHAGE) 1000 MG tablet Take 1 tablet (1,000 mg total) by mouth 2 (two) times daily with a meal. 03/26/20   Shamleffer, Konrad Dolores, MD  metoprolol tartrate (LOPRESSOR) 100 MG tablet Take 100 mg (1 tablet) TWO hours prior to CT 03/18/20   Little Ishikawa, MD  nitroGLYCERIN (NITROSTAT) 0.4 MG SL tablet Place 1 tablet (0.4 mg total) under the tongue every 5 (five) minutes as needed for chest pain. 01/23/20 04/22/20  Barrett, Joline Salt, PA-C  nystatin (NYSTATIN) powder Apply 1 application topically daily as needed (yeast).    [provider]  polyethylene glycol (MIRALAX / GLYCOLAX) 17 g packet Take 17 g by mouth daily.    [provider]  potassium chloride SA (KLOR-CON) 20 MEQ tablet Take 1 tablet (20 mEq total) by mouth daily. 01/03/20 04/02/20  Little Ishikawa, MD  Sennosides (EX-LAX) 15 MG TABS Take 15 mg by mouth daily as needed (constipation).    [provider]  tamsulosin (FLOMAX) 0.4 MG CAPS capsule Take 0.4 mg by mouth daily.    [provider]    Family History Family History  Problem Relation Age of Onset  . Dementia Mother   . Renal Disease Father   . Diabetes Other     Social History Social History   Tobacco Use  . Smoking status: Never Smoker  . Smokeless tobacco: Never Used  Substance Use Topics   . Alcohol use: No  . Drug use: No     Allergies   Oxycodone-acetaminophen   Review of Systems Review of Systems  Constitutional: Negative for chills and fever.  HENT: Positive for congestion and sore throat. Negative for ear pain.   Eyes: Negative for pain and visual disturbance.  Respiratory: Positive for shortness of breath. Negative for cough.   Cardiovascular: Negative for chest pain and palpitations.  Gastrointestinal: Negative for abdominal pain, diarrhea and vomiting.  Genitourinary: Negative for dysuria and hematuria.  Musculoskeletal: Negative for arthralgias and back pain.  Skin: Negative for color change and rash.  Neurological: Negative for seizures and syncope.  All other systems reviewed and are negative.    Physical Exam Triage Vital Signs ED Triage Vitals  Enc Vitals Group     BP 08/07/20 1920 (!) 174/82     Pulse  Rate 08/07/20 1920 78     Resp 08/07/20 1920 16     Temp 08/07/20 1920 98.6 F (37 C)     Temp Source 08/07/20 1920 Oral     SpO2 08/07/20 1920 94 %     Weight --      Height --      Head Circumference --      Peak Flow --      Pain Score 08/07/20 1918 3     Pain Loc --      Pain Edu? --      Excl. in GC? --    No data found.  Updated Vital Signs BP (!) 174/82 (BP Location: Right Arm)   Pulse 78   Temp 98.6 F (37 C) (Oral)   Resp 16   SpO2 94%   Visual Acuity Right Eye Distance:   Left Eye Distance:   Bilateral Distance:    Right Eye Near:   Left Eye Near:    Bilateral Near:     Physical Exam Vitals and nursing note reviewed.  Constitutional:      General: She is not in acute distress.    Appearance: She is well-developed and well-nourished. She is obese. She is not ill-appearing.  HENT:     Head: Normocephalic and atraumatic.     Right Ear: Tympanic membrane normal.     Left Ear: Tympanic membrane normal.     Nose: Nose normal.     Mouth/Throat:     Mouth: Mucous membranes are moist.     Pharynx: Oropharynx is  clear.  Eyes:     Conjunctiva/sclera: Conjunctivae normal.  Cardiovascular:     Rate and Rhythm: Normal rate and regular rhythm.     Heart sounds: Normal heart sounds.  Pulmonary:     Effort: Pulmonary effort is normal. No respiratory distress.     Breath sounds: Normal breath sounds. No rales.  Abdominal:     Palpations: Abdomen is soft.     Tenderness: There is no abdominal tenderness.  Musculoskeletal:        General: No edema.     Cervical back: Neck supple.  Skin:    General: Skin is warm and dry.  Neurological:     Mental Status: She is alert. Mental status is at baseline.  Psychiatric:        Mood and Affect: Mood and affect and mood normal.        Behavior: Behavior normal.      UC Treatments / Results  Labs (all labs ordered are listed, but only abnormal results are displayed) Labs Reviewed  SARS CORONAVIRUS 2 (TAT 6-24 HRS)    EKG   Radiology No results found.  Procedures Procedures (including critical care time)  Medications Ordered in UC Medications - No data to display  Initial Impression / Assessment and Plan / UC Course  I have reviewed the triage vital signs and the nursing notes.  Pertinent labs & imaging results that were available during my care of the patient were reviewed by me and considered in my medical decision making (see chart for details).   Viral illness. Elevated blood pressure reading with known hypertension. Patient is in no respiratory distress, lungs are clear, O2 sat 94% is similar to previous.  COVID pending.  Instructed patient to self quarantine until the test results are back.  Discussed symptomatic treatment including Tylenol, rest, hydration.  Instructed patient to follow up with PCP if her symptoms are not improving. Discussed  that her blood pressure is elevated today needs to be rechecked by her PCP in 1 to 2 weeks. Patient agrees to plan of care.    Final Clinical Impressions(s) / UC Diagnoses   Final diagnoses:   Viral illness  Elevated blood pressure reading in office with diagnosis of hypertension     Discharge Instructions     Your COVID test is pending.  You should self quarantine until the test result is back.  Take Tylenol or ibuprofen as needed for fever or discomfort.  Rest and keep yourself hydrated.  Follow-up with your primary care provider if your symptoms are not improving.    Your blood pressure is elevated today at 174/82.  Please have this rechecked by your primary care provider in 1-2 weeks.           ED Prescriptions    None     PDMP not reviewed this encounter.   Mickie Bail, NP 08/07/20 2024

## 2020-08-07 NOTE — ED Triage Notes (Signed)
Pt presents with sore throat, congestion and shortness of breath since Sunday.

## 2020-08-08 LAB — SARS CORONAVIRUS 2 (TAT 6-24 HRS): SARS Coronavirus 2: NEGATIVE

## 2020-09-07 DIAGNOSIS — I5030 Unspecified diastolic (congestive) heart failure: Secondary | ICD-10-CM | POA: Diagnosis not present

## 2020-09-11 DIAGNOSIS — E119 Type 2 diabetes mellitus without complications: Secondary | ICD-10-CM | POA: Diagnosis not present

## 2020-09-24 ENCOUNTER — Ambulatory Visit: Payer: Medicaid Other | Admitting: Internal Medicine

## 2020-09-24 NOTE — Progress Notes (Deleted)
Name: Whitney Sloan  Age/ Sex: 65 y.o., female   MRN/ DOB: [id number], 1955-07-01     PCP: Zachery Dauer, FNP   Reason for Endocrinology Evaluation: Type 2 Diabetes Mellitus  Initial Endocrine Consultative Visit: 09/10/2018    PATIENT IDENTIFIER: [name] is a 65 y.o. female with a past medical history of HTN, T2DM and Dyslipidemia. The patient has followed with Endocrinology clinic since 09/10/2018 for consultative assistance with management of her diabetes.  DIABETIC HISTORY:  Whitney Sloan was diagnosed with T2DM in 2008, she has been on Metformin, Glipizide, Januvia and Actos in the past.She has been on insulin therapy for years. Her hemoglobin A1c has ranged from 8.0%in 2011, peaking at12.5%in 2010.  On her initial visit to our clinic her A1c was 11.0% , she was on lantus, Invokana, Metfomin and Pioglitazone but she was not taking them. Bydureon was added.   Invokana was stopped by the patient by 06/2019 due to recurrent yeast infections Prandial insulin added 07/2019  SUBJECTIVE:   During the last visit (03/26/2020): A1 c 8.7% .  Adjusted MDi regimen and continued metformin     Today (09/24/2020): Whitney Sloan is here for a follow up on diabetes..  She has been checking glucose 1 a day . The patient has had hypoglycemic episodes since the last clinic visit. Otherwise, the patient has required mergency interventions acute respiratory failure (10/2019)    Denies nausea, diarrhea or sob     HOME DIABETES REGIMEN:   Lantus 36 units daily   Novolog 18 units with each meal  Metformin 1000 mg BID      METER DOWNLOAD SUMMARY: 10/8-10/14/2021 Overall Mean FS Glucose = 182  BG Ranges: Low = 87 High = 358   Hypoglycemic Events/30 Days: BG < 50 = 0 Episodes of symptomatic severe hypoglycemia = 0     HISTORY:  Past Medical History:  Past Medical History:  Diagnosis Date  . Abnormal stress test 12/31/2019   medium defect of moderate severity present in  the basal inferior, mid inferior and apex location.  Findings consistent with ischemia  . Chronic diastolic (congestive) heart failure (HCC)   . Diabetes mellitus   . Hyperlipemia   . Hypertension   . Psoriasis    Past Surgical History:  Past Surgical History:  Procedure Laterality Date  . ANAL FISSURE REPAIR    . ENDOMETRIAL ABLATION      Social History:  reports that she has never smoked. She has never used smokeless tobacco. She reports that she does not drink alcohol and does not use drugs. Family History:  Family History  Problem Relation Age of Onset  . Dementia Mother   . Renal Disease Father   . Diabetes Other      HOME MEDICATIONS: Allergies as of 09/24/2020      Reactions   Oxycodone-acetaminophen Nausea And Vomiting      Medication List       Accurate as of September 24, 2020  7:36 AM. If you have any questions, ask your nurse or doctor.        amLODipine 10 MG tablet Commonly known as: NORVASC Take 1 tablet (10 mg total) by mouth daily.   Aspir-Low 81 MG EC tablet Generic drug: aspirin Take 81 mg by mouth every morning.   atorvastatin 20 MG tablet Commonly known as: LIPITOR Take 1 tablet (20 mg total) by mouth at bedtime.   clotrimazole 1 % vaginal cream Commonly known as: GYNE-LOTRIMIN Place 1  Applicatorful vaginally daily as needed (yeast).   diclofenac 75 MG EC tablet Commonly known as: VOLTAREN Take 1 tablet (75 mg total) by mouth 2 (two) times daily.   Diclofenac Sodium 3 % Gel Apply 2 g topically 2 (two) times daily as needed.   Ex-Lax 15 MG Tabs Generic drug: Sennosides Take 15 mg by mouth daily as needed (constipation).   ezetimibe 10 MG tablet Commonly known as: ZETIA Take 10 mg by mouth at bedtime.   furosemide 80 MG tablet Commonly known as: LASIX Take 1 tablet (80 mg total) by mouth 2 (two) times daily.   ibuprofen 200 MG tablet Commonly known as: ADVIL Take 400 mg by mouth every 4 (four) hours as needed for fever, headache  or moderate pain.   Insulin Pen Needle 32G X 4 MM Misc 1 Device by Does not apply route as directed.   isosorbide mononitrate 30 MG 24 hr tablet Commonly known as: IMDUR Take 1 tablet (30 mg total) by mouth daily.   Lantus SoloStar 100 UNIT/ML Solostar Pen Generic drug: insulin glargine Inject 36 Units into the skin daily.   lisinopril 40 MG tablet Commonly known as: ZESTRIL Take 1 tablet (40 mg total) by mouth daily.   magnesium oxide 400 MG tablet Commonly known as: MAG-OX Take 1 tablet (400 mg total) by mouth daily.   metFORMIN 1000 MG tablet Commonly known as: GLUCOPHAGE Take 1 tablet (1,000 mg total) by mouth 2 (two) times daily with a meal.   metoprolol tartrate 100 MG tablet Commonly known as: LOPRESSOR Take 100 mg (1 tablet) TWO hours prior to CT   nitroGLYCERIN 0.4 MG SL tablet Commonly known as: NITROSTAT Place 1 tablet (0.4 mg total) under the tongue every 5 (five) minutes as needed for chest pain.   NovoLOG FlexPen 100 UNIT/ML FlexPen Generic drug: insulin aspart Inject 18 Units into the skin 3 (three) times daily with meals.   nystatin powder Generic drug: nystatin Apply 1 application topically daily as needed (yeast).   polyethylene glycol 17 g packet Commonly known as: MIRALAX / GLYCOLAX Take 17 g by mouth daily.   potassium chloride SA 20 MEQ tablet Commonly known as: KLOR-CON Take 1 tablet (20 mEq total) by mouth daily.   tamsulosin 0.4 MG Caps capsule Commonly known as: FLOMAX Take 0.4 mg by mouth daily.   VITAMIN D PO Take 1 capsule by mouth daily.        OBJECTIVE:   Vital Signs: There were no vitals taken for this visit.  Wt Readings from Last 3 Encounters:  04/10/20 230 lb (104.3 kg)  03/26/20 246 lb 3.2 oz (111.7 kg)  03/18/20 247 lb (112 kg)     Exam: General: Pt appears well and is in NAD  Lungs: CTA   Heart: RRR  Extremities: 2+  pretibial edema.  Neuro: MS is good with appropriate affect, pt is alert and Ox3     DM Foot Exam 11/21/2019 The skin of the feet is without sores or ulcerations. The pedal pulses undetectable due to edema  The sensation is decreased to a screening 5.07, 10 gram monofilament bilaterally       DATA REVIEWED:  Lab Results  Component Value Date   HGBA1C 8.7 (A) 03/26/2020   HGBA1C 10.1 (H) 11/08/2019   HGBA1C 11.7 (A) 08/07/2019            ASSESSMENT / PLAN / RECOMMENDATIONS:   1) Type2Diabetes Mellitus,Poorlycontrolled, Withneuropathic complications - Most recent A1c of8.7%. Goal A1c <7.0%.   -  A1c down from 10.1%  - I have congratulated her on the great work she has done thus far -I have encouraged her to continue with medication adherence - In review of her glucose meter download, she was noted with fasting hypoglycemia but hyperglycemia noted during the day  - Will make the following adjustements   MEDICATIONS: - Decrease  Lantus 36 units daily   - Continue Metformin 1000 mg twice a day  - Increase NovoLog 18 units with each meal  EDUCATION / INSTRUCTIONS:  BG monitoring instructions: Patient is instructed to check her blood sugars 3 times a day, before meals  Call [facility] if: BG persistently < 70  . I reviewed the Rule of 15 for the treatment of hypoglycemia in detail with the patient. Literature supplied.   F/U in 6 months    Signed electronically by: Lyndle Herrlich, MD  [facility] Endocrinology  [facility] [address] [place], [place] [address] Phone: [phone] FAX: [phone]   CC: Zachery Dauer, FNP [address] [place] [address] Phone: [phone]  Fax: [phone]  Return to Endocrinology clinic as below: Future Appointments  Date Time Provider Department Center  09/24/2020 10:30 AM Sotero Brinkmeyer, Konrad Dolores, MD [id number] None

## 2020-10-14 ENCOUNTER — Ambulatory Visit (INDEPENDENT_AMBULATORY_CARE_PROVIDER_SITE_OTHER): Payer: Medicaid Other | Admitting: Internal Medicine

## 2020-10-14 ENCOUNTER — Encounter: Payer: Self-pay | Admitting: Internal Medicine

## 2020-10-14 ENCOUNTER — Other Ambulatory Visit: Payer: Self-pay

## 2020-10-14 VITALS — BP 140/68 | HR 78 | Ht 65.0 in | Wt 249.1 lb

## 2020-10-14 DIAGNOSIS — R809 Proteinuria, unspecified: Secondary | ICD-10-CM | POA: Diagnosis not present

## 2020-10-14 DIAGNOSIS — E1142 Type 2 diabetes mellitus with diabetic polyneuropathy: Secondary | ICD-10-CM

## 2020-10-14 DIAGNOSIS — E1129 Type 2 diabetes mellitus with other diabetic kidney complication: Secondary | ICD-10-CM

## 2020-10-14 DIAGNOSIS — E1159 Type 2 diabetes mellitus with other circulatory complications: Secondary | ICD-10-CM | POA: Diagnosis not present

## 2020-10-14 DIAGNOSIS — Z794 Long term (current) use of insulin: Secondary | ICD-10-CM

## 2020-10-14 DIAGNOSIS — E1165 Type 2 diabetes mellitus with hyperglycemia: Secondary | ICD-10-CM

## 2020-10-14 LAB — POCT GLYCOSYLATED HEMOGLOBIN (HGB A1C): Hemoglobin A1C: 13 % — AB (ref 4.0–5.6)

## 2020-10-14 LAB — MICROALBUMIN / CREATININE URINE RATIO
Creatinine,U: 36.7 mg/dL
Microalb Creat Ratio: 444.1 mg/g — ABNORMAL HIGH (ref 0.0–30.0)
Microalb, Ur: 163.1 mg/dL — ABNORMAL HIGH (ref 0.0–1.9)

## 2020-10-14 MED ORDER — DEXCOM G6 SENSOR MISC: 1.0000 | 3 refills | Status: DC

## 2020-10-14 MED ORDER — RYBELSUS 7 MG PO TABS: 7.0000 mg | ORAL_TABLET | Freq: Every day | ORAL | 1 refills | Status: DC

## 2020-10-14 MED ORDER — LANTUS SOLOSTAR 100 UNIT/ML ~~LOC~~ SOPN: 40.0000 [IU] | PEN_INJECTOR | Freq: Every day | SUBCUTANEOUS | 3 refills | Status: DC

## 2020-10-14 MED ORDER — DEXCOM G6 TRANSMITTER MISC: 1.0000 | 3 refills | Status: DC

## 2020-10-14 MED ORDER — DEXCOM G6 RECEIVER DEVI: 1.0000 | 0 refills | Status: DC

## 2020-10-14 MED ORDER — RYBELSUS 3 MG PO TABS: 3.0000 mg | ORAL_TABLET | Freq: Every day | ORAL | 0 refills | Status: DC

## 2020-10-14 NOTE — Patient Instructions (Addendum)
-   Increase Lantus to 40 units daily  - Start Rybelsus 3 mg , 1 tablet before breakfast, after a month, pick up the 7 mg prescription  - Continue Metformin 1000 mg twice a day  - Continue Novolog 18 units with each meal    Let the office know when you get the Dexcom, so I can refer you to our diabetic educator to teach  you how to use it.     HOW TO TREAT LOW BLOOD SUGARS (Blood sugar LESS THAN 70 MG/DL)  Please follow the RULE OF 15 for the treatment of hypoglycemia treatment (when your (blood sugars are less than 70 mg/dL)    STEP 1: Take 15 grams of carbohydrates when your blood sugar is low, which includes:   3-4 GLUCOSE TABS  OR  3-4 OZ OF JUICE OR REGULAR SODA OR  ONE TUBE OF GLUCOSE GEL     STEP 2: RECHECK blood sugar in 15 MINUTES STEP 3: If your blood sugar is still low at the 15 minute recheck --> then, go back to STEP 1 and treat AGAIN with another 15 grams of carbohydrates.

## 2020-10-14 NOTE — Progress Notes (Signed)
Name: Whitney Sloan  Age/ Sex: 65 y.o., female   MRN/ DOB: [id number], 19-Nov-1955     PCP: Zachery Dauer, FNP   Reason for Endocrinology Evaluation: Type 2 Diabetes Mellitus  Initial Endocrine Consultative Visit: 09/10/2018    PATIENT IDENTIFIER: Whitney Sloan is a 65 y.o. female with a past medical history of HTN, T2DM and Dyslipidemia. The patient has followed with Endocrinology clinic since 09/10/2018 for consultative assistance with management of her diabetes.  DIABETIC HISTORY:  Ms. Avilla was diagnosed with T2DM in 2008, she has been on Metformin, Glipizide, Januvia and Actos in the past.She has been on insulin therapy for years. Her hemoglobin A1c has ranged from 8.0%in 2011, peaking at12.5%in 2010.  On her initial visit to our clinic her A1c was 11.0% , she was on lantus, Invokana, Metfomin and Pioglitazone but she was not taking them. Bydureon was added.   Invokana was stopped by the patient by 06/2019 due to recurrent yeast infections Prandial insulin added 07/2019  SUBJECTIVE:   During the last visit (03/26/2020): A1 c 8.7% .  Adjusted MDi regimen and continued metformin     Today (10/14/2020): Whitney Sloan is here for a follow up on diabetes..  She has been checking glucose 1 a week  . The patient has not had hypoglycemic episodes since the last clinic visit.    Denies nausea or diarrhea, but has loose stools   Eats 1-2 meals    HOME DIABETES REGIMEN:  Lantus 36 units daily  Novolog 18 units with each meal Metformin 1000 mg BID      METER DOWNLOAD SUMMARY: 4/5-10/14/2020 Overall Mean FS Glucose = 330  BG Ranges: Low = 297 High = 391   Hypoglycemic Events/30 Days: BG < 50 = 0 Episodes of symptomatic severe hypoglycemia = 0     HISTORY:  Past Medical History:  Past Medical History:  Diagnosis Date  . Abnormal stress test 12/31/2019   medium defect of moderate severity present in the basal inferior, mid inferior and apex location.  Findings  consistent with ischemia  . Chronic diastolic (congestive) heart failure (HCC)   . Diabetes mellitus   . Hyperlipemia   . Hypertension   . Psoriasis    Past Surgical History:  Past Surgical History:  Procedure Laterality Date  . ANAL FISSURE REPAIR    . ENDOMETRIAL ABLATION     Social History:  reports that she has never smoked. She has never used smokeless tobacco. She reports that she does not drink alcohol and does not use drugs. Family History:  Family History  Problem Relation Age of Onset  . Dementia Mother   . Renal Disease Father   . Diabetes Other      HOME MEDICATIONS: Allergies as of 10/14/2020      Reactions   Oxycodone-acetaminophen Nausea And Vomiting      Medication List       Accurate as of Oct 14, 2020  2:01 PM. If you have any questions, ask your nurse or doctor.        STOP taking these medications   amLODipine 10 MG tablet Commonly known as: NORVASC Stopped by: Scarlette Shorts, MD   isosorbide mononitrate 30 MG 24 hr tablet Commonly known as: IMDUR Stopped by: Scarlette Shorts, MD     TAKE these medications   Aspir-Low 81 MG EC tablet Generic drug: aspirin Take 81 mg by mouth every morning.   atorvastatin 80 MG tablet Commonly known as: LIPITOR  Take 80 mg by mouth daily. What changed: Another medication with the same name was removed. Continue taking this medication, and follow the directions you see here. Changed by: Scarlette Shorts, MD   clotrimazole 1 % vaginal cream Commonly known as: GYNE-LOTRIMIN Place 1 Applicatorful vaginally daily as needed (yeast).   Dexcom G6 Receiver Devi 1 Device by Does not apply route as directed. Started by: Scarlette Shorts, MD   Dexcom G6 Sensor Misc 1 Device by Does not apply route as directed. Started by: Scarlette Shorts, MD   Dexcom G6 Transmitter Misc 1 Device by Does not apply route as directed. Started by: Scarlette Shorts, MD   diclofenac 75 MG EC  tablet Commonly known as: VOLTAREN Take 1 tablet (75 mg total) by mouth 2 (two) times daily.   Diclofenac Sodium 3 % Gel Apply 2 g topically 2 (two) times daily as needed.   diltiazem 240 MG 24 hr capsule Commonly known as: DILACOR XR Take 240 mg by mouth daily.   Ex-Lax 15 MG Tabs Generic drug: Sennosides Take 15 mg by mouth daily as needed (constipation).   ezetimibe 10 MG tablet Commonly known as: ZETIA Take 10 mg by mouth at bedtime.   furosemide 80 MG tablet Commonly known as: LASIX Take 1 tablet (80 mg total) by mouth 2 (two) times daily.   ibuprofen 200 MG tablet Commonly known as: ADVIL Take 400 mg by mouth every 4 (four) hours as needed for fever, headache or moderate pain.   Insulin Pen Needle 32G X 4 MM Misc 1 Device by Does not apply route as directed.   Lantus SoloStar 100 UNIT/ML Solostar Pen Generic drug: insulin glargine Inject 36 Units into the skin daily.   lisinopril 40 MG tablet Commonly known as: ZESTRIL Take 1 tablet (40 mg total) by mouth daily.   losartan 50 MG tablet Commonly known as: COZAAR Take 50 mg by mouth daily.   magnesium oxide 400 MG tablet Commonly known as: MAG-OX Take 1 tablet (400 mg total) by mouth daily.   metFORMIN 1000 MG tablet Commonly known as: GLUCOPHAGE Take 1 tablet (1,000 mg total) by mouth 2 (two) times daily with a meal.   metoprolol succinate 50 MG 24 hr tablet Commonly known as: TOPROL-XL Take 50 mg by mouth daily.   metoprolol tartrate 100 MG tablet Commonly known as: LOPRESSOR Take 100 mg (1 tablet) TWO hours prior to CT   nitroGLYCERIN 0.4 MG SL tablet Commonly known as: NITROSTAT Place 1 tablet (0.4 mg total) under the tongue every 5 (five) minutes as needed for chest pain.   NovoLOG FlexPen 100 UNIT/ML FlexPen Generic drug: insulin aspart Inject 18 Units into the skin 3 (three) times daily with meals.   nystatin powder Commonly known as: MYCOSTATIN/NYSTOP Apply 1 application topically daily  as needed (yeast).   polyethylene glycol 17 g packet Commonly known as: MIRALAX / GLYCOLAX Take 17 g by mouth daily.   potassium chloride SA 20 MEQ tablet Commonly known as: KLOR-CON Take 1 tablet (20 mEq total) by mouth daily.   Rybelsus 3 MG Tabs Generic drug: Semaglutide Take 3 mg by mouth daily before breakfast. Started by: Scarlette Shorts, MD   Rybelsus 7 MG Tabs Generic drug: Semaglutide Take 7 mg by mouth daily before breakfast. Started by: Scarlette Shorts, MD   tamsulosin 0.4 MG Caps capsule Commonly known as: FLOMAX Take 0.4 mg by mouth daily.   VITAMIN D PO Take 1 capsule by mouth daily.  OBJECTIVE:   Vital Signs: BP 140/68   Pulse 78   Ht 5\' 5"  (1.651 m)   Wt 249 lb 2 oz (113 kg)   SpO2 98%   BMI 41.46 kg/m   Wt Readings from Last 3 Encounters:  10/14/20 249 lb 2 oz (113 kg)  04/10/20 230 lb (104.3 kg)  03/26/20 246 lb 3.2 oz (111.7 kg)     Exam: General: Pt appears well and is in NAD  Lungs: CTA   Heart: RRR  Extremities: Trace  pretibial edema.  Neuro: MS is good with appropriate affect, pt is alert and Ox3    DM Foot Exam 11/21/2019 The skin of the feet is without sores or ulcerations. The pedal pulses undetectable due to edema  The sensation is decreased to a screening 5.07, 10 gram monofilament bilaterally       DATA REVIEWED:  Lab Results  Component Value Date   HGBA1C 13.0 (A) 10/14/2020   HGBA1C 8.7 (A) 03/26/2020   HGBA1C 10.1 (H) 11/08/2019            ASSESSMENT / PLAN / RECOMMENDATIONS:   1) Type2Diabetes Mellitus,Poorlycontrolled, Withneuropathic complications and microalbuminuria- Most recent A1c of13.0 %. Goal A1c <7.0%.   - A1c Up from 8.7% , this is due to medication non adherence.  - We discussed adding Rybelsus, cautioned against GI side effects  - Prescribed Dexcom, she was advised to let me know when she gets it, so we can refer her to our educator for training.  - We again  discussed risk of retinopathy, neuropathy, CKD and amputations. - Major barriers to self care is memory issues  - Pt urged to have an eye exam    MEDICATIONS: - Increase  Lantus to 40 units daily   - Continue Metformin 1000 mg twice a day  - Continue  NovoLog 18 units with each meal - Start Rybelsus 3 mg daily for a month, followed by 7 mg daily   EDUCATION / INSTRUCTIONS: BG monitoring instructions: Patient is instructed to check her blood sugars 3 times a day, before meals Call [place] [facility] if: BG persistently < 70  I reviewed the Rule of 15 for the treatment of hypoglycemia in detail with the patient. Literature supplied.   2) Microalbuminuria   -We will emphasize the importance of glycemic control.  We will increase losartan dose from 50 to 100 mg daily    Medication Stop losartan 50 mg Start losartan 100 mg    F/U in 4 months   Addendum: I have attempted to contact the patient on 10/15/2020 to discuss microalbuminuria.  The voice female had a female's  voice with the name Eli.  I did not leave a message.  A letter will be sent to the patient     Signed electronically by: 12/15/2020, MD  [facility] Endocrinology  [facility] [address] [place] [place], [place] Phone: [phone] FAX: [phone]   CC: [phone], FNP [address] [place] Phone: [phone]  Fax: [phone]  Return to Endocrinology clinic as below: No future appointments.

## 2020-10-15 ENCOUNTER — Encounter: Payer: Self-pay | Admitting: Internal Medicine

## 2020-10-15 DIAGNOSIS — E1129 Type 2 diabetes mellitus with other diabetic kidney complication: Secondary | ICD-10-CM | POA: Insufficient documentation

## 2020-10-15 DIAGNOSIS — R809 Proteinuria, unspecified: Secondary | ICD-10-CM | POA: Insufficient documentation

## 2020-10-15 DIAGNOSIS — E119 Type 2 diabetes mellitus without complications: Secondary | ICD-10-CM | POA: Insufficient documentation

## 2020-10-15 DIAGNOSIS — Z794 Long term (current) use of insulin: Secondary | ICD-10-CM | POA: Insufficient documentation

## 2020-10-15 MED ORDER — LOSARTAN POTASSIUM 100 MG PO TABS: 100.0000 mg | ORAL_TABLET | Freq: Every day | ORAL | 3 refills | Status: DC

## 2020-10-16 ENCOUNTER — Telehealth: Payer: Self-pay | Admitting: Internal Medicine

## 2020-10-16 NOTE — Telephone Encounter (Signed)
Patient called re: [facility] told Patient that the following RX's are pending due to they require PA's:  Continuous Blood Gluc Receiver (DEXCOM G6 RECEIVER) DEVI  Continuous Blood Gluc Sensor (DEXCOM G6 SENSOR) MISC  Continuous Blood Gluc Transmit (DEXCOM G6 TRANSMITTER) MISC

## 2020-10-16 NOTE — Telephone Encounter (Signed)
Notified patient that I have already submitted PA to the insurance. Inform now we have to wait for a respond.

## 2020-12-13 ENCOUNTER — Emergency Department (HOSPITAL_COMMUNITY): Payer: Medicare Other

## 2020-12-13 ENCOUNTER — Encounter (HOSPITAL_COMMUNITY): Payer: Self-pay | Admitting: Family Medicine

## 2020-12-13 ENCOUNTER — Inpatient Hospital Stay (HOSPITAL_COMMUNITY)
Admission: EM | Admit: 2020-12-13 | Discharge: 2020-12-18 | DRG: 291 | Disposition: A | Discharge status: Home / Self Care | Payer: Medicare Other | Attending: Internal Medicine | Admitting: Internal Medicine

## 2020-12-13 DIAGNOSIS — U071 COVID-19: Secondary | ICD-10-CM | POA: Diagnosis not present

## 2020-12-13 DIAGNOSIS — E876 Hypokalemia: Secondary | ICD-10-CM | POA: Diagnosis not present

## 2020-12-13 DIAGNOSIS — T380X5A Adverse effect of glucocorticoids and synthetic analogues, initial encounter: Secondary | ICD-10-CM | POA: Diagnosis not present

## 2020-12-13 DIAGNOSIS — E782 Mixed hyperlipidemia: Secondary | ICD-10-CM | POA: Diagnosis not present

## 2020-12-13 DIAGNOSIS — E139 Other specified diabetes mellitus without complications: Secondary | ICD-10-CM | POA: Diagnosis not present

## 2020-12-13 DIAGNOSIS — I5043 Acute on chronic combined systolic (congestive) and diastolic (congestive) heart failure: Secondary | ICD-10-CM | POA: Diagnosis present

## 2020-12-13 DIAGNOSIS — R0602 Shortness of breath: Secondary | ICD-10-CM | POA: Diagnosis not present

## 2020-12-13 DIAGNOSIS — E1142 Type 2 diabetes mellitus with diabetic polyneuropathy: Secondary | ICD-10-CM | POA: Diagnosis not present

## 2020-12-13 DIAGNOSIS — Z6841 Body Mass Index (BMI) 40.0 and over, adult: Secondary | ICD-10-CM | POA: Diagnosis not present

## 2020-12-13 DIAGNOSIS — Z833 Family history of diabetes mellitus: Secondary | ICD-10-CM

## 2020-12-13 DIAGNOSIS — I509 Heart failure, unspecified: Secondary | ICD-10-CM

## 2020-12-13 DIAGNOSIS — I5033 Acute on chronic diastolic (congestive) heart failure: Secondary | ICD-10-CM | POA: Diagnosis not present

## 2020-12-13 DIAGNOSIS — I160 Hypertensive urgency: Secondary | ICD-10-CM | POA: Diagnosis not present

## 2020-12-13 DIAGNOSIS — E1165 Type 2 diabetes mellitus with hyperglycemia: Secondary | ICD-10-CM | POA: Diagnosis not present

## 2020-12-13 DIAGNOSIS — R000 Tachycardia, unspecified: Secondary | ICD-10-CM | POA: Diagnosis not present

## 2020-12-13 DIAGNOSIS — Z9111 Patient's noncompliance with dietary regimen: Secondary | ICD-10-CM

## 2020-12-13 DIAGNOSIS — Z885 Allergy status to narcotic agent status: Secondary | ICD-10-CM

## 2020-12-13 DIAGNOSIS — Z794 Long term (current) use of insulin: Secondary | ICD-10-CM | POA: Diagnosis not present

## 2020-12-13 DIAGNOSIS — Z8701 Personal history of pneumonia (recurrent): Secondary | ICD-10-CM | POA: Diagnosis not present

## 2020-12-13 DIAGNOSIS — Z79899 Other long term (current) drug therapy: Secondary | ICD-10-CM

## 2020-12-13 DIAGNOSIS — Z28310 Unvaccinated for covid-19: Secondary | ICD-10-CM

## 2020-12-13 DIAGNOSIS — Z7982 Long term (current) use of aspirin: Secondary | ICD-10-CM | POA: Diagnosis not present

## 2020-12-13 DIAGNOSIS — R0902 Hypoxemia: Secondary | ICD-10-CM | POA: Diagnosis not present

## 2020-12-13 DIAGNOSIS — J9601 Acute respiratory failure with hypoxia: Secondary | ICD-10-CM | POA: Diagnosis present

## 2020-12-13 DIAGNOSIS — L409 Psoriasis, unspecified: Secondary | ICD-10-CM | POA: Diagnosis not present

## 2020-12-13 DIAGNOSIS — J811 Chronic pulmonary edema: Secondary | ICD-10-CM | POA: Diagnosis not present

## 2020-12-13 DIAGNOSIS — Z7984 Long term (current) use of oral hypoglycemic drugs: Secondary | ICD-10-CM

## 2020-12-13 DIAGNOSIS — I110 Hypertensive heart disease with heart failure: Secondary | ICD-10-CM | POA: Diagnosis not present

## 2020-12-13 DIAGNOSIS — R0600 Dyspnea, unspecified: Secondary | ICD-10-CM | POA: Diagnosis not present

## 2020-12-13 DIAGNOSIS — T68XXXA Hypothermia, initial encounter: Secondary | ICD-10-CM | POA: Diagnosis not present

## 2020-12-13 DIAGNOSIS — R739 Hyperglycemia, unspecified: Secondary | ICD-10-CM | POA: Diagnosis not present

## 2020-12-13 LAB — RESP PANEL BY RT-PCR (FLU A&B, COVID) ARPGX2
Influenza A by PCR: NEGATIVE
Influenza B by PCR: NEGATIVE
SARS Coronavirus 2 by RT PCR: POSITIVE — AB

## 2020-12-13 LAB — COMPREHENSIVE METABOLIC PANEL
ALT: 15 U/L (ref 0–44)
AST: 18 U/L (ref 15–41)
Albumin: 2.9 g/dL — ABNORMAL LOW (ref 3.5–5.0)
Alkaline Phosphatase: 76 U/L (ref 38–126)
Anion gap: 8 (ref 5–15)
BUN: 9 mg/dL (ref 8–23)
CO2: 30 mmol/L (ref 22–32)
Calcium: 8.8 mg/dL — ABNORMAL LOW (ref 8.9–10.3)
Chloride: 102 mmol/L (ref 98–111)
Creatinine, Ser: 0.81 mg/dL (ref 0.44–1.00)
GFR, Estimated: >60 mL/min (ref >60)
Glucose, Bld: 302 mg/dL — ABNORMAL HIGH (ref 70–99)
Potassium: 3.8 mmol/L (ref 3.5–5.1)
Sodium: 140 mmol/L (ref 135–145)
Total Bilirubin: 0.8 mg/dL (ref 0.3–1.2)
Total Protein: 6.9 g/dL (ref 6.5–8.1)

## 2020-12-13 LAB — CBC WITH DIFFERENTIAL/PLATELET
Abs Immature Granulocytes: 0.04 10*3/uL (ref 0.00–0.07)
Basophils Absolute: 0 10*3/uL (ref 0.0–0.1)
Basophils Relative: 0 %
Eosinophils Absolute: 0.1 10*3/uL (ref 0.0–0.5)
Eosinophils Relative: 1 %
HCT: 41.5 % (ref 36.0–46.0)
Hemoglobin: 12.7 g/dL (ref 12.0–15.0)
Immature Granulocytes: 1 %
Lymphocytes Relative: 12 %
Lymphs Abs: 0.9 10*3/uL (ref 0.7–4.0)
MCH: 27 pg (ref 26.0–34.0)
MCHC: 30.6 g/dL (ref 30.0–36.0)
MCV: 88.3 fL (ref 80.0–100.0)
Monocytes Absolute: 0.8 10*3/uL (ref 0.1–1.0)
Monocytes Relative: 10 %
Neutro Abs: 6.3 10*3/uL (ref 1.7–7.7)
Neutrophils Relative %: 76 %
Platelets: 264 10*3/uL (ref 150–400)
RBC: 4.7 MIL/uL (ref 3.87–5.11)
RDW: 15.9 % — ABNORMAL HIGH (ref 11.5–15.5)
WBC: 8.2 10*3/uL (ref 4.0–10.5)
nRBC: 0 % (ref 0.0–0.2)

## 2020-12-13 LAB — I-STAT ARTERIAL BLOOD GAS, ED
Acid-Base Excess: 5 mmol/L — ABNORMAL HIGH (ref 0.0–2.0)
Bicarbonate: 30.6 mmol/L — ABNORMAL HIGH (ref 20.0–28.0)
Calcium, Ion: 1.19 mmol/L (ref 1.15–1.40)
HCT: 39 % (ref 36.0–46.0)
Hemoglobin: 13.3 g/dL (ref 12.0–15.0)
O2 Saturation: 96 %
Patient temperature: 98.2
Potassium: 3.5 mmol/L (ref 3.5–5.1)
Sodium: 140 mmol/L (ref 135–145)
TCO2: 32 mmol/L (ref 22–32)
pCO2 arterial: 49.8 mmHg — ABNORMAL HIGH (ref 32.0–48.0)
pH, Arterial: 7.396 (ref 7.350–7.450)
pO2, Arterial: 84 mmHg (ref 83.0–108.0)

## 2020-12-13 LAB — I-STAT CHEM 8, ED
BUN: 10 mg/dL (ref 8–23)
Calcium, Ion: 1.13 mmol/L — ABNORMAL LOW (ref 1.15–1.40)
Chloride: 100 mmol/L (ref 98–111)
Creatinine, Ser: 0.6 mg/dL (ref 0.44–1.00)
Glucose, Bld: 303 mg/dL — ABNORMAL HIGH (ref 70–99)
HCT: 42 % (ref 36.0–46.0)
Hemoglobin: 14.3 g/dL (ref 12.0–15.0)
Potassium: 3.8 mmol/L (ref 3.5–5.1)
Sodium: 141 mmol/L (ref 135–145)
TCO2: 29 mmol/L (ref 22–32)

## 2020-12-13 LAB — PROTIME-INR
INR: 1.1 (ref 0.8–1.2)
Prothrombin Time: 13.7 seconds (ref 11.4–15.2)

## 2020-12-13 LAB — BRAIN NATRIURETIC PEPTIDE: B Natriuretic Peptide: 516 pg/mL — ABNORMAL HIGH (ref 0.0–100.0)

## 2020-12-13 LAB — TROPONIN I (HIGH SENSITIVITY)
Troponin I (High Sensitivity): 26 ng/L — ABNORMAL HIGH (ref <18)
Troponin I (High Sensitivity): 41 ng/L — ABNORMAL HIGH (ref <18)

## 2020-12-13 LAB — C-REACTIVE PROTEIN: CRP: <0.5 mg/dL (ref <1.0)

## 2020-12-13 LAB — CBG MONITORING, ED
Glucose-Capillary: 265 mg/dL — ABNORMAL HIGH (ref 70–99)
Glucose-Capillary: 313 mg/dL — ABNORMAL HIGH (ref 70–99)

## 2020-12-13 LAB — LACTATE DEHYDROGENASE: LDH: 243 U/L — ABNORMAL HIGH (ref 98–192)

## 2020-12-13 LAB — D-DIMER, QUANTITATIVE: D-Dimer, Quant: 1.02 ug/mL-FEU — ABNORMAL HIGH (ref 0.00–0.50)

## 2020-12-13 MED ORDER — INSULIN ASPART 100 UNIT/ML IJ SOLN
0.0000 [IU] | Freq: Three times a day (TID) | INTRAMUSCULAR | Status: DC
  Administered 2020-12-14: 15 [IU] via SUBCUTANEOUS
  Administered 2020-12-14: 8 [IU] via SUBCUTANEOUS
  Administered 2020-12-14 – 2020-12-15 (×4): 11 [IU] via SUBCUTANEOUS
  Administered 2020-12-16 (×3): 15 [IU] via SUBCUTANEOUS
  Administered 2020-12-17: 5 [IU] via SUBCUTANEOUS
  Administered 2020-12-17 (×2): 8 [IU] via SUBCUTANEOUS
  Administered 2020-12-18 (×2): 3 [IU] via SUBCUTANEOUS

## 2020-12-13 MED ORDER — METHYLPREDNISOLONE SODIUM SUCC 125 MG IJ SOLR
0.5000 mg/kg | Freq: Two times a day (BID) | INTRAMUSCULAR | Status: AC
  Administered 2020-12-13 – 2020-12-16 (×6): 55 mg via INTRAVENOUS
  Filled 2020-12-13 (×6): qty 2

## 2020-12-13 MED ORDER — INSULIN ASPART 100 UNIT/ML IJ SOLN
6.0000 [IU] | Freq: Three times a day (TID) | INTRAMUSCULAR | Status: DC
  Administered 2020-12-14 – 2020-12-18 (×13): 6 [IU] via SUBCUTANEOUS

## 2020-12-13 MED ORDER — NITROGLYCERIN IN D5W 200-5 MCG/ML-% IV SOLN: 0.0000 ug/min | INTRAVENOUS | Status: DC

## 2020-12-13 MED ORDER — DICLOFENAC SODIUM 1 % EX GEL: 2.0000 g | Freq: Two times a day (BID) | CUTANEOUS | Status: DC | PRN

## 2020-12-13 MED ORDER — METOPROLOL SUCCINATE ER 50 MG PO TB24
50.0000 mg | ORAL_TABLET | Freq: Every day | ORAL | Status: DC
  Administered 2020-12-14 – 2020-12-18 (×5): 50 mg via ORAL
  Filled 2020-12-13 (×5): qty 1

## 2020-12-13 MED ORDER — ENOXAPARIN SODIUM 60 MG/0.6ML IJ SOSY
50.0000 mg | PREFILLED_SYRINGE | INTRAMUSCULAR | Status: DC
  Administered 2020-12-13: 50 mg via SUBCUTANEOUS
  Filled 2020-12-13: qty 0.5

## 2020-12-13 MED ORDER — SODIUM CHLORIDE 0.9 % IV SOLN
200.0000 mg | Freq: Once | INTRAVENOUS | Status: AC
  Administered 2020-12-13: 200 mg via INTRAVENOUS
  Filled 2020-12-13: qty 40

## 2020-12-13 MED ORDER — ASPIRIN 81 MG PO CHEW
81.0000 mg | CHEWABLE_TABLET | ORAL | Status: DC
  Administered 2020-12-14 – 2020-12-18 (×5): 81 mg via ORAL
  Filled 2020-12-13 (×5): qty 1

## 2020-12-13 MED ORDER — SODIUM CHLORIDE 0.9 % IV SOLN
250.0000 mL | INTRAVENOUS | Status: DC | PRN
  Administered 2020-12-16: 250 mL via INTRAVENOUS

## 2020-12-13 MED ORDER — LOSARTAN POTASSIUM 50 MG PO TABS
100.0000 mg | ORAL_TABLET | Freq: Every day | ORAL | Status: DC
  Administered 2020-12-14 – 2020-12-18 (×5): 100 mg via ORAL
  Filled 2020-12-13 (×5): qty 2

## 2020-12-13 MED ORDER — TAMSULOSIN HCL 0.4 MG PO CAPS
0.4000 mg | ORAL_CAPSULE | Freq: Every day | ORAL | Status: DC
  Administered 2020-12-14 – 2020-12-18 (×5): 0.4 mg via ORAL
  Filled 2020-12-13 (×5): qty 1

## 2020-12-13 MED ORDER — FUROSEMIDE 10 MG/ML IJ SOLN
60.0000 mg | Freq: Two times a day (BID) | INTRAMUSCULAR | Status: DC
  Administered 2020-12-14 – 2020-12-15 (×3): 60 mg via INTRAVENOUS
  Filled 2020-12-13 (×2): qty 6

## 2020-12-13 MED ORDER — INSULIN ASPART 100 UNIT/ML IJ SOLN
0.0000 [IU] | Freq: Every day | INTRAMUSCULAR | Status: DC
Start: 2020-12-13 — End: 2020-12-18
  Administered 2020-12-14: 4 [IU] via SUBCUTANEOUS
  Administered 2020-12-15 – 2020-12-16 (×2): 5 [IU] via SUBCUTANEOUS
  Administered 2020-12-17: 4 [IU] via SUBCUTANEOUS

## 2020-12-13 MED ORDER — DILTIAZEM HCL ER COATED BEADS 240 MG PO CP24
240.0000 mg | ORAL_CAPSULE | Freq: Every day | ORAL | Status: DC
  Administered 2020-12-14 – 2020-12-18 (×5): 240 mg via ORAL
  Filled 2020-12-13 (×6): qty 1

## 2020-12-13 MED ORDER — NITROGLYCERIN IN D5W 200-5 MCG/ML-% IV SOLN
0.0000 ug/min | INTRAVENOUS | Status: DC
  Administered 2020-12-13: 10 ug/min via INTRAVENOUS
  Filled 2020-12-13: qty 250

## 2020-12-13 MED ORDER — ONDANSETRON HCL 4 MG/2ML IJ SOLN: 4.0000 mg | Freq: Four times a day (QID) | INTRAMUSCULAR | Status: DC | PRN

## 2020-12-13 MED ORDER — FUROSEMIDE 10 MG/ML IJ SOLN
80.0000 mg | Freq: Once | INTRAMUSCULAR | Status: AC
  Administered 2020-12-13: 80 mg via INTRAVENOUS
  Filled 2020-12-13: qty 8

## 2020-12-13 MED ORDER — INSULIN GLARGINE 100 UNIT/ML ~~LOC~~ SOLN
30.0000 [IU] | Freq: Every day | SUBCUTANEOUS | Status: DC
  Administered 2020-12-13 – 2020-12-15 (×3): 30 [IU] via SUBCUTANEOUS
  Filled 2020-12-13 (×3): qty 0.3

## 2020-12-13 MED ORDER — GUAIFENESIN-DM 100-10 MG/5ML PO SYRP
10.0000 mL | ORAL_SOLUTION | ORAL | Status: DC | PRN
  Administered 2020-12-14 – 2020-12-16 (×4): 10 mL via ORAL
  Filled 2020-12-13 (×4): qty 10

## 2020-12-13 MED ORDER — SODIUM CHLORIDE 0.9 % IV SOLN
100.0000 mg | Freq: Every day | INTRAVENOUS | Status: AC
  Administered 2020-12-14 – 2020-12-17 (×4): 100 mg via INTRAVENOUS
  Filled 2020-12-13 (×5): qty 20

## 2020-12-13 MED ORDER — ATORVASTATIN CALCIUM 80 MG PO TABS
80.0000 mg | ORAL_TABLET | Freq: Every day | ORAL | Status: DC
  Administered 2020-12-14 – 2020-12-18 (×5): 80 mg via ORAL
  Filled 2020-12-13 (×5): qty 1

## 2020-12-13 MED ORDER — POTASSIUM CHLORIDE CRYS ER 20 MEQ PO TBCR
20.0000 meq | EXTENDED_RELEASE_TABLET | Freq: Every day | ORAL | Status: DC
  Administered 2020-12-14 – 2020-12-18 (×5): 20 meq via ORAL
  Filled 2020-12-13 (×5): qty 1

## 2020-12-13 MED ORDER — ACETAMINOPHEN 325 MG PO TABS
650.0000 mg | ORAL_TABLET | ORAL | Status: DC | PRN
  Administered 2020-12-15: 650 mg via ORAL
  Filled 2020-12-13: qty 2

## 2020-12-13 MED ORDER — LABETALOL HCL 5 MG/ML IV SOLN: 10.0000 mg | INTRAVENOUS | Status: DC | PRN

## 2020-12-13 MED ORDER — PREDNISONE 50 MG PO TABS
50.0000 mg | ORAL_TABLET | Freq: Every day | ORAL | Status: DC
  Administered 2020-12-17 – 2020-12-18 (×2): 50 mg via ORAL
  Filled 2020-12-13 (×2): qty 1

## 2020-12-13 NOTE — ED Triage Notes (Signed)
Brought in by EMS for sob x 2 days. O2 70s with EMS. Placed on CPAP with 93% on the monitor. Hx of CHF. Pt has increased ascites. CBG 307 BP 191/104 HR 108

## 2020-12-13 NOTE — H&P (Addendum)
History and Physical    Whitney Sloan FVC:[id number] DOB: [birth year] DOA: 12/13/2020  PCP: Zachery Dauer, FNP   Patient coming from: Home   Chief Complaint: SOB, increased swelling, weight gain   HPI: Whitney Sloan is a 65 y.o. female with medical history significant for hypertension, insulin-dependent diabetes mellitus, BMI 40, and chronic diastolic CHF, now presenting to the emergency department for evaluation of shortness of breath, swelling, and weight gain.  Patient reports increased bilateral lower extremity swelling, swelling into the stomach, 10 pound weight gain, and 2 days of worsening shortness of breath becoming severe today.  Patient reports adherence with 80 mg Lasix daily (though appears to be prescribed twice daily), but notes recent dietary indiscretion while vacationing at the beach.  She has been coughing with some frothy sputum but denies fevers, chills, or chest pain.  She was reportedly saturating in the 49s with EMS and was transported to the ED on CPAP.  ED Course: Upon arrival to the ED, patient is found to be afebrile, saturating upper 90s on BiPAP, tachypneic, slightly tachycardic, and with blood pressure 190/100s.  Chemistry panel notable for glucose 392.  CBC unremarkable.  Troponin 26 and BNP 516.  Chest x-ray concerning for pulmonary edema.  Blood cultures were collected and the patient was started on nitroglycerin infusion, BiPAP, and given 80 mg IV Lasix.  Review of Systems:  All other systems reviewed and apart from HPI, are negative.  Past Medical History:  Diagnosis Date   Abnormal stress Sloan 12/31/2019   medium defect of moderate severity present in the basal inferior, mid inferior and apex location.  Findings consistent with ischemia   Chronic diastolic (congestive) heart failure (HCC)    Diabetes mellitus    Hyperlipemia    Hypertension    Psoriasis     Past Surgical History:  Procedure Laterality Date   ANAL FISSURE REPAIR     ENDOMETRIAL  ABLATION      Social History:   reports that she has never smoked. She has never used smokeless tobacco. She reports that she does not drink alcohol and does not use drugs.  Allergies  Allergen Reactions   Oxycodone-Acetaminophen Nausea And Vomiting    Family History  Problem Relation Age of Onset   Dementia Mother    Renal Disease Father    Diabetes Other      Prior to Admission medications   Medication Sig Start Date End Date Taking? Authorizing Provider  aspirin (ASPIR-LOW) 81 MG EC tablet Take 81 mg by mouth every morning.    [provider]  atorvastatin (LIPITOR) 80 MG tablet Take 80 mg by mouth daily.    [provider]  Cholecalciferol (VITAMIN D PO) Take 1 capsule by mouth daily.    [provider]  clotrimazole (GYNE-LOTRIMIN) 1 % vaginal cream Place 1 Applicatorful vaginally daily as needed (yeast).    [provider]  Continuous Blood Gluc Receiver (DEXCOM G6 RECEIVER) DEVI 1 Device by Does not apply route as directed. 10/14/20   Shamleffer, Konrad Dolores, MD  Continuous Blood Gluc Sensor (DEXCOM G6 SENSOR) MISC 1 Device by Does not apply route as directed. 10/14/20   Shamleffer, Konrad Dolores, MD  Continuous Blood Gluc Transmit (DEXCOM G6 TRANSMITTER) MISC 1 Device by Does not apply route as directed. 10/14/20   Shamleffer, Konrad Dolores, MD  diclofenac (VOLTAREN) 75 MG EC tablet Take 1 tablet (75 mg total) by mouth 2 (two) times daily. 03/01/20   Moshe Cipro, NP  Diclofenac Sodium 3 % GEL Apply 2 g topically 2 (two) times daily as needed. 04/10/20   Nestor Ramp, MD  diltiazem (DILACOR XR) 240 MG 24 hr capsule Take 240 mg by mouth daily.    [provider]  ezetimibe (ZETIA) 10 MG tablet Take 10 mg by mouth at bedtime. 08/21/19   [provider]  furosemide (LASIX) 80 MG tablet Take 1 tablet (80 mg total) by mouth 2 (two) times daily. 03/18/20   Little Ishikawa, MD  ibuprofen (ADVIL) 200 MG tablet Take  400 mg by mouth every 4 (four) hours as needed for fever, headache or moderate pain.     [provider]  insulin aspart (NOVOLOG FLEXPEN) 100 UNIT/ML FlexPen Inject 18 Units into the skin 3 (three) times daily with meals. 03/26/20   Shamleffer, Konrad Dolores, MD  insulin glargine (LANTUS SOLOSTAR) 100 UNIT/ML Solostar Pen Inject 40 Units into the skin daily. 10/14/20   Shamleffer, Konrad Dolores, MD  Insulin Pen Needle 32G X 4 MM MISC 1 Device by Does not apply route as directed. 03/26/20   Shamleffer, Konrad Dolores, MD  losartan (COZAAR) 100 MG tablet Take 1 tablet (100 mg total) by mouth daily. 10/15/20   Shamleffer, Konrad Dolores, MD  magnesium oxide (MAG-OX) 400 MG tablet Take 1 tablet (400 mg total) by mouth daily. 01/03/20   Little Ishikawa, MD  metFORMIN (GLUCOPHAGE) 1000 MG tablet Take 1 tablet (1,000 mg total) by mouth 2 (two) times daily with a meal. 03/26/20   Shamleffer, Konrad Dolores, MD  metoprolol succinate (TOPROL-XL) 50 MG 24 hr tablet Take 50 mg by mouth daily. 09/05/20   [provider]  metoprolol tartrate (LOPRESSOR) 100 MG tablet Take 100 mg (1 tablet) TWO hours prior to CT 03/18/20   Little Ishikawa, MD  nitroGLYCERIN (NITROSTAT) 0.4 MG SL tablet Place 1 tablet (0.4 mg total) under the tongue every 5 (five) minutes as needed for chest pain. Patient not taking: Reported on 10/14/2020 01/23/20 04/22/20  Barrett, Joline Salt, PA-C  nystatin (MYCOSTATIN/NYSTOP) powder Apply 1 application topically daily as needed (yeast).    [provider]  polyethylene glycol (MIRALAX / GLYCOLAX) 17 g packet Take 17 g by mouth daily.    [provider]  potassium chloride SA (KLOR-CON) 20 MEQ tablet Take 1 tablet (20 mEq total) by mouth daily. 01/03/20 04/02/20  Little Ishikawa, MD  Semaglutide (RYBELSUS) 3 MG TABS Take 3 mg by mouth daily before breakfast. 10/14/20   Shamleffer, Konrad Dolores, MD  Semaglutide (RYBELSUS) 7 MG TABS Take 7 mg  by mouth daily before breakfast. 10/14/20   Shamleffer, Konrad Dolores, MD  Sennosides (EX-LAX) 15 MG TABS Take 15 mg by mouth daily as needed (constipation).    [provider]  tamsulosin (FLOMAX) 0.4 MG CAPS capsule Take 0.4 mg by mouth daily.    [provider]    Physical Exam: Vitals:   12/13/20 1900 12/13/20 1915 12/13/20 1930 12/13/20 1945  BP: (!) 156/85 (!) 158/81 (!) 150/85 (!) 149/78  Pulse: (!) 102 99 98 95  Resp: 17 (!) 24 (!) 23 (!) 23  Temp:      TempSrc:      SpO2: 96% 96% 98% 96%  Weight:      Height:        Constitutional: NAD, calm  Eyes: PERTLA, lids and conjunctivae normal ENMT: Mucous membranes are moist. Posterior pharynx clear of any exudate or lesions.   Neck: supple, no masses  Respiratory: clear to auscultation bilaterally, no wheezing, no crackles.   Cardiovascular: S1 & S2 heard, regular rate and rhythm. Bilateral LE pitting edema extending to thighs.   Abdomen: No distension, no tenderness, soft. Bowel sounds active.  Musculoskeletal: no clubbing / cyanosis. No joint deformity upper and lower extremities.   Skin: no significant rashes, lesions, ulcers. Warm, dry, well-perfused. Neurologic: CN 2-12 grossly intact. Sensation intact. Moving all extremities.  Psychiatric: Alert and oriented to person, place, and situation. Pleasant and cooperative.    Labs and Imaging on Admission: I have personally reviewed following labs and imaging studies  CBC: Recent Labs  Lab 12/13/20 1754 12/13/20 1802 12/13/20 1826  WBC 8.2  --   --   NEUTROABS 6.3  --   --   HGB 12.7 13.3 14.3  HCT 41.5 39.0 42.0  MCV 88.3  --   --   PLT 264  --   --    Basic Metabolic Panel: Recent Labs  Lab 12/13/20 1754 12/13/20 1802 12/13/20 1826  NA 140 140 141  K 3.8 3.5 3.8  CL 102  --  100  CO2 30  --   --   GLUCOSE 302*  --  303*  BUN 9  --  10  CREATININE 0.81  --  0.60  CALCIUM 8.8*  --   --    GFR: Estimated Creatinine Clearance: 86.4 mL/min  (by C-G formula based on SCr of 0.6 mg/dL). Liver Function Tests: Recent Labs  Lab 12/13/20 1754  AST 18  ALT 15  ALKPHOS 76  BILITOT 0.8  PROT 6.9  ALBUMIN 2.9*   No results for input(s): LIPASE, AMYLASE in the last 168 hours. No results for input(s): AMMONIA in the last 168 hours. Coagulation Profile: Recent Labs  Lab 12/13/20 1754  INR 1.1   Cardiac Enzymes: No results for input(s): CKTOTAL, CKMB, CKMBINDEX, TROPONINI in the last 168 hours. BNP (last 3 results) No results for input(s): PROBNP in the last 8760 hours. HbA1C: No results for input(s): HGBA1C in the last 72 hours. CBG: Recent Labs  Lab 12/13/20 1923  GLUCAP 265*   Lipid Profile: No results for input(s): CHOL, HDL, LDLCALC, TRIG, CHOLHDL, LDLDIRECT in the last 72 hours. Thyroid Function Tests: No results for input(s): TSH, T4TOTAL, FREET4, T3FREE, THYROIDAB in the last 72 hours. Anemia Panel: No results for input(s): VITAMINB12, FOLATE, FERRITIN, TIBC, IRON, RETICCTPCT in the last 72 hours. Urine analysis:    Component Value Date/Time   COLORURINE AMBER (A) 12/10/2019 1122   APPEARANCEUR CLOUDY (A) 12/10/2019 1122   LABSPEC 1.015 12/10/2019 1122   PHURINE 5.0 12/10/2019 1122   GLUCOSEU NEGATIVE 12/10/2019 1122   HGBUR MODERATE (A) 12/10/2019 1122   BILIRUBINUR NEGATIVE 12/10/2019 1122   KETONESUR NEGATIVE 12/10/2019 1122   PROTEINUR >=300 (A) 12/10/2019 1122   UROBILINOGEN 0.2 10/01/2016 1648   NITRITE NEGATIVE 12/10/2019 1122   LEUKOCYTESUR LARGE (A) 12/10/2019 1122   Sepsis Labs: @LABRCNTIP (procalcitonin:4,lacticidven:4) )No results found for this or any previous visit (from the past 240 hour(s)).   Radiological Exams on Admission: DG Chest Port 1 View  Result Date: 12/13/2020 CLINICAL DATA:  Dyspnea.  Shortness of breath. EXAM: PORTABLE CHEST 1 VIEW COMPARISON:  12/10/2019 FINDINGS: Heart is mildly enlarged, stable in configuration. There is increased interstitial pulmonary edema compared  to prior study. More confluent opacity at the LEFT lung base likely represents pulmonary edema but infectious infiltrate could have a similar appearance. IMPRESSION: Findings consistent with pulmonary edema. Electronically Signed   By:  Norva Pavlov M.D.   On: 12/13/2020 18:58    EKG: Independently reviewed. Sinus tachycardia, rate 109, non-specific repolarization abnormality.   Assessment/Plan   1. Acute on chronic diastolic CHF; acute hypoxic respiratory failure  - EF 50-55% with grade 2 diastolic dysfunction in May 2022  - Presents with progressive edema, wt gain,and SOB  - Saturating 70s with EMS, BNP 516 and pulm edema on CXR in ED - Pt acknowledges dietary indiscretion while at the beach a few days ago - Started on BiPAP and nitroglycerin infusion in ED and given Lasix   - Continue diuresis with Lasix 60 mg IV q12h, continue BiPAP for now, stop nitroglycerin infusion, continue beta-blocker and ARB, monitor weight and I/Os, encourage dietary/fluid restrictions and daily weights on discharge    ADDENDUM: COVID pcr is positive. She has had sore throat since 12/09/20 and this is likely acute infection. She is clearly hypervolemic and it is unclear how much COVID may be contributing to her respiratory failure. Plan to trial her off BiPAP, start remdesivir and systemic steroid, trend markers.    2. Uncontrolled insulin-dependent T2DM  - A1c was 13.0% in May 2022  - Continue CBG checks and insulin    3. Hypertensive urgency  - BP was 191/104 with EMS   - She was started on nitroglycerin infusion in ED  - BP improving with diuresis, plan to stop nitroglycerin, continue diuresis, continue losartan, metoprolol, and diltiazem     DVT prophylaxis: Lovenox  Code Status: Full  Level of Care: Level of care: Progressive Family Communication: Daughter updated at bedside  Disposition Plan:  Patient is from: Home  Anticipated d/c is to: home  Anticipated d/c date is: 12/15/20 Patient currently:  Pending improvement in respiratory status Consults called: None  Admission status: Inpatient     Briscoe Deutscher, MD [facility]  12/13/2020, 8:11 PM

## 2020-12-13 NOTE — ED Provider Notes (Signed)
[facility] EMERGENCY DEPARTMENT Provider Note   CSN: [id number] Arrival date & time: 12/13/20  1751     History Chief Complaint  Patient presents with   Shortness of Breath    Whitney Sloan is a 65 y.o. female.  65 year old female with prior medical history as detailed below presents for evaluation.  Patient reports 2 days of gradually worsening shortness of breath.  Patient reports increased edema to both lower extremities.  She does not weigh herself daily, however she reports about 10 pounds of weight gain that over the last 2 to 3 weeks.  She reports prior history of CHF.  She called EMS today for assistance.  EMS reports that she was in significant respiratory distress on their evaluation.  Room air sats were in the mid 70s.  She was placed on CPAP.  She was improved with this.  She was also given 125 mg of Solu-Medrol.  The history is provided by the patient and medical records.  Shortness of Breath Severity:  Severe Onset quality:  Sudden Duration:  2 days Timing:  Constant Progression:  Worsening Chronicity:  New Relieved by:  Nothing Worsened by:  Nothing     Past Medical History:  Diagnosis Date   Abnormal stress test 12/31/2019   medium defect of moderate severity present in the basal inferior, mid inferior and apex location.  Findings consistent with ischemia   Chronic diastolic (congestive) heart failure (HCC)    Diabetes mellitus    Hyperlipemia    Hypertension    Psoriasis     Patient Active Problem List   Diagnosis Date Noted   Type 2 diabetes mellitus with microalbuminuria, with long-term current use of insulin (HCC) 10/15/2020   Right knee pain 04/10/2020   Chest pain of uncertain etiology 12/19/2019   CAP (community acquired pneumonia) 12/19/2019   Constipation 12/10/2019   Cholelithiasis 12/10/2019   Thyroid nodule 12/10/2019   Obesity (BMI 30-39.9) 12/10/2019   Acute on chronic diastolic heart failure (HCC) 11/08/2019    Acute respiratory failure with hypoxia (HCC) 11/08/2019   Chronic diastolic CHF (congestive heart failure) (HCC) 11/08/2019   Hypertensive crisis 11/08/2019   Mixed diabetic hyperlipidemia associated with type 2 diabetes mellitus (HCC) 11/08/2019   Type 2 diabetes mellitus with diabetic polyneuropathy, with long-term current use of insulin (HCC) 08/08/2019   Dyslipidemia 03/23/2010   DECREASED HEARING, BILATERAL 02/05/2010   MEMORY LOSS 02/05/2010   INCONTINENCE 02/05/2010   NEOPLASM, MALIGNANT, BREAST, HX OF 01/13/2010   ANXIETY STATE, UNSPECIFIED 12/02/2009   UNSTEADY GAIT 12/02/2009   TOE PAIN 09/24/2009   ANAL OR RECTAL PAIN 08/25/2009   LEG EDEMA, BILATERAL 08/25/2009   Uncontrolled type 2 diabetes mellitus with hyperglycemia, with long-term current use of insulin (HCC) 08/06/2009   Essential hypertension 08/06/2009   DERMATITIS, ATOPIC 08/06/2009    Past Surgical History:  Procedure Laterality Date   ANAL FISSURE REPAIR     ENDOMETRIAL ABLATION       OB History   No obstetric history on file.     Family History  Problem Relation Age of Onset   Dementia Mother    Renal Disease Father    Diabetes Other     Social History   Tobacco Use   Smoking status: Never   Smokeless tobacco: Never  Substance Use Topics   Alcohol use: No   Drug use: No    Home Medications Prior to Admission medications   Medication Sig Start Date End Date Taking? Authorizing Provider  aspirin (ASPIR-LOW) 81 MG EC tablet Take 81 mg by mouth every morning.    [provider]  atorvastatin (LIPITOR) 80 MG tablet Take 80 mg by mouth daily.    [provider]  Cholecalciferol (VITAMIN D PO) Take 1 capsule by mouth daily.    [provider]  clotrimazole (GYNE-LOTRIMIN) 1 % vaginal cream Place 1 Applicatorful vaginally daily as needed (yeast).    [provider]  Continuous Blood Gluc Receiver (DEXCOM G6 RECEIVER) DEVI 1 Device by Does not apply route as  directed. 10/14/20   Shamleffer, Konrad Dolores, MD  Continuous Blood Gluc Sensor (DEXCOM G6 SENSOR) MISC 1 Device by Does not apply route as directed. 10/14/20   Shamleffer, Konrad Dolores, MD  Continuous Blood Gluc Transmit (DEXCOM G6 TRANSMITTER) MISC 1 Device by Does not apply route as directed. 10/14/20   Shamleffer, Konrad Dolores, MD  diclofenac (VOLTAREN) 75 MG EC tablet Take 1 tablet (75 mg total) by mouth 2 (two) times daily. 03/01/20   Moshe Cipro, NP  Diclofenac Sodium 3 % GEL Apply 2 g topically 2 (two) times daily as needed. 04/10/20   Nestor Ramp, MD  diltiazem (DILACOR XR) 240 MG 24 hr capsule Take 240 mg by mouth daily.    [provider]  ezetimibe (ZETIA) 10 MG tablet Take 10 mg by mouth at bedtime. 08/21/19   [provider]  furosemide (LASIX) 80 MG tablet Take 1 tablet (80 mg total) by mouth 2 (two) times daily. 03/18/20   Little Ishikawa, MD  ibuprofen (ADVIL) 200 MG tablet Take 400 mg by mouth every 4 (four) hours as needed for fever, headache or moderate pain.     [provider]  insulin aspart (NOVOLOG FLEXPEN) 100 UNIT/ML FlexPen Inject 18 Units into the skin 3 (three) times daily with meals. 03/26/20   Shamleffer, Konrad Dolores, MD  insulin glargine (LANTUS SOLOSTAR) 100 UNIT/ML Solostar Pen Inject 40 Units into the skin daily. 10/14/20   Shamleffer, Konrad Dolores, MD  Insulin Pen Needle 32G X 4 MM MISC 1 Device by Does not apply route as directed. 03/26/20   Shamleffer, Konrad Dolores, MD  losartan (COZAAR) 100 MG tablet Take 1 tablet (100 mg total) by mouth daily. 10/15/20   Shamleffer, Konrad Dolores, MD  magnesium oxide (MAG-OX) 400 MG tablet Take 1 tablet (400 mg total) by mouth daily. 01/03/20   Little Ishikawa, MD  metFORMIN (GLUCOPHAGE) 1000 MG tablet Take 1 tablet (1,000 mg total) by mouth 2 (two) times daily with a meal. 03/26/20   Shamleffer, Konrad Dolores, MD  metoprolol succinate (TOPROL-XL) 50 MG 24 hr tablet  Take 50 mg by mouth daily. 09/05/20   [provider]  metoprolol tartrate (LOPRESSOR) 100 MG tablet Take 100 mg (1 tablet) TWO hours prior to CT 03/18/20   Little Ishikawa, MD  nitroGLYCERIN (NITROSTAT) 0.4 MG SL tablet Place 1 tablet (0.4 mg total) under the tongue every 5 (five) minutes as needed for chest pain. Patient not taking: Reported on 10/14/2020 01/23/20 04/22/20  Barrett, Joline Salt, PA-C  nystatin (MYCOSTATIN/NYSTOP) powder Apply 1 application topically daily as needed (yeast).    [provider]  polyethylene glycol (MIRALAX / GLYCOLAX) 17 g packet Take 17 g by mouth daily.    [provider]  potassium chloride SA (KLOR-CON) 20 MEQ tablet Take 1 tablet (20 mEq total) by mouth daily. 01/03/20 04/02/20  Little Ishikawa, MD  Semaglutide (RYBELSUS) 3 MG TABS Take 3 mg by mouth  daily before breakfast. 10/14/20   Shamleffer, Konrad Dolores, MD  Semaglutide (RYBELSUS) 7 MG TABS Take 7 mg by mouth daily before breakfast. 10/14/20   Shamleffer, Konrad Dolores, MD  Sennosides (EX-LAX) 15 MG TABS Take 15 mg by mouth daily as needed (constipation).    [provider]  tamsulosin (FLOMAX) 0.4 MG CAPS capsule Take 0.4 mg by mouth daily.    [provider]    Allergies    Oxycodone-acetaminophen  Review of Systems   Review of Systems  Respiratory:  Positive for shortness of breath.   All other systems reviewed and are negative.  Physical Exam Updated Vital Signs BP (!) 190/156   Pulse (!) 105   Temp 98.2 F (36.8 C) (Axillary)   Resp (!) 32   Ht 5\' 5"  (1.651 m)   Wt 109.8 kg   SpO2 97%   BMI 40.27 kg/m   Physical Exam Vitals and nursing note reviewed.  Constitutional:      General: She is not in acute distress.    Appearance: Normal appearance. She is well-developed.  HENT:     Head: Normocephalic and atraumatic.  Eyes:     Conjunctiva/sclera: Conjunctivae normal.     Pupils: Pupils are equal, round, and reactive to light.   Cardiovascular:     Rate and Rhythm: Normal rate and regular rhythm.     Heart sounds: Normal heart sounds.  Pulmonary:     Effort: Tachypnea present. No respiratory distress.     Breath sounds: Examination of the right-middle field reveals decreased breath sounds. Examination of the left-middle field reveals decreased breath sounds. Examination of the right-lower field reveals decreased breath sounds. Examination of the left-lower field reveals decreased breath sounds. Decreased breath sounds present.  Abdominal:     General: There is no distension.     Palpations: Abdomen is soft.     Tenderness: There is no abdominal tenderness.  Musculoskeletal:        General: No deformity. Normal range of motion.     Cervical back: Normal range of motion and neck supple.  Skin:    General: Skin is warm and dry.  Neurological:     General: No focal deficit present.     Mental Status: She is alert and oriented to person, place, and time.    ED Results / Procedures / Treatments   Labs (all labs ordered are listed, but only abnormal results are displayed) Labs Reviewed  CBC WITH DIFFERENTIAL/PLATELET - Abnormal; Notable for the following components:      Result Value   RDW 15.9 (*)    All other components within normal limits  I-STAT CHEM 8, ED - Abnormal; Notable for the following components:   Glucose, Bld 303 (*)    Calcium, Ion 1.13 (*)    All other components within normal limits  I-STAT ARTERIAL BLOOD GAS, ED - Abnormal; Notable for the following components:   pCO2 arterial 49.8 (*)    Bicarbonate 30.6 (*)    Acid-Base Excess 5.0 (*)    All other components within normal limits  CULTURE, BLOOD (ROUTINE X 2)  CULTURE, BLOOD (ROUTINE X 2)  RESP PANEL BY RT-PCR (FLU A&B, COVID) ARPGX2  PROTIME-INR  BLOOD GAS, ARTERIAL  COMPREHENSIVE METABOLIC PANEL  BRAIN NATRIURETIC PEPTIDE  CBG MONITORING, ED  TROPONIN I (HIGH SENSITIVITY)    EKG None  Radiology No results  found.  Procedures Procedures  CRITICAL CARE Performed by:   Total critical care time: 14  minutes  Critical care time was exclusive of separately billable procedures and treating other patients.  Critical care was necessary to treat or prevent imminent or life-threatening deterioration.  Critical care was time spent personally by me on the following activities: development of treatment plan with patient and/or surrogate as well as nursing, discussions with consultants, evaluation of patient's response to treatment, examination of patient, obtaining history from patient or surrogate, ordering and performing treatments and interventions, ordering and review of laboratory studies, ordering and review of radiographic studies, pulse oximetry and re-evaluation of patient's condition.   Medications Ordered in ED Medications  nitroGLYCERIN 50 mg in dextrose 5 % 250 mL (0.2 mg/mL) infusion (10 mcg/min Intravenous New Bag/Given 12/13/20 1824)  furosemide (LASIX) injection 80 mg (80 mg Intravenous Given 12/13/20 1822)    ED Course  I have reviewed the triage vital signs and the nursing notes.  Pertinent labs & imaging results that were available during my care of the patient were reviewed by me and considered in my medical decision making (see chart for details).    MDM Rules/Calculators/A&P                          MDM  MSE complete  Whitney Sloan was evaluated in Emergency Department on 12/13/2020 for the symptoms described in the history of present illness. She was evaluated in the context of the global COVID-19 pandemic, which necessitated consideration that the patient might be at risk for infection with the SARS-CoV-2 virus that causes COVID-19. Institutional protocols and algorithms that pertain to the evaluation of patients at risk for COVID-19 are in a state of rapid change based on information released by regulatory bodies including the CDC and federal and state  organizations. These policies and algorithms were followed during the patient's care in the ED.  Patient is presenting with gradually worsening dyspnea over the course the last several days.  EMS reports room air sats at home were in the 70s.  Patient is improved with CPAP and then BiPAP.  Patient given 80 mg of Lasix on arrival.  Nitro drip initiated for treatment of presumed CHF exacerbation.  Patient seems to be tolerating these interventions well.  She reports feeling improved.  Patient would benefit from admission for further work-up and diuresis.  Hospitalist service is aware of case and will evaluate for same.  Final Clinical Impression(s) / ED Diagnoses Final diagnoses:  Dyspnea, unspecified type  Acute on chronic congestive heart failure, unspecified heart failure type [facility])    Rx / DC Orders ED Discharge Orders     None        Wynetta Fines, MD 12/13/20 1943

## 2020-12-14 ENCOUNTER — Other Ambulatory Visit: Payer: Self-pay

## 2020-12-14 DIAGNOSIS — U071 COVID-19: Secondary | ICD-10-CM | POA: Diagnosis not present

## 2020-12-14 DIAGNOSIS — E1165 Type 2 diabetes mellitus with hyperglycemia: Secondary | ICD-10-CM | POA: Diagnosis not present

## 2020-12-14 DIAGNOSIS — Z794 Long term (current) use of insulin: Secondary | ICD-10-CM | POA: Diagnosis not present

## 2020-12-14 DIAGNOSIS — I160 Hypertensive urgency: Secondary | ICD-10-CM | POA: Diagnosis not present

## 2020-12-14 DIAGNOSIS — I5033 Acute on chronic diastolic (congestive) heart failure: Secondary | ICD-10-CM | POA: Diagnosis not present

## 2020-12-14 LAB — GLUCOSE, CAPILLARY
Glucose-Capillary: 364 mg/dL — ABNORMAL HIGH (ref 70–99)
Glucose-Capillary: 332 mg/dL — ABNORMAL HIGH (ref 70–99)
Glucose-Capillary: 325 mg/dL — ABNORMAL HIGH (ref 70–99)
Glucose-Capillary: 304 mg/dL — ABNORMAL HIGH (ref 70–99)

## 2020-12-14 LAB — COMPREHENSIVE METABOLIC PANEL
ALT: 13 U/L (ref 0–44)
AST: 17 U/L (ref 15–41)
Albumin: 2.6 g/dL — ABNORMAL LOW (ref 3.5–5.0)
Alkaline Phosphatase: 67 U/L (ref 38–126)
Anion gap: 9 (ref 5–15)
BUN: 14 mg/dL (ref 8–23)
CO2: 26 mmol/L (ref 22–32)
Calcium: 8.4 mg/dL — ABNORMAL LOW (ref 8.9–10.3)
Chloride: 104 mmol/L (ref 98–111)
Creatinine, Ser: 0.92 mg/dL (ref 0.44–1.00)
GFR, Estimated: >60 mL/min (ref >60)
Glucose, Bld: 378 mg/dL — ABNORMAL HIGH (ref 70–99)
Potassium: 4.1 mmol/L (ref 3.5–5.1)
Sodium: 139 mmol/L (ref 135–145)
Total Bilirubin: 0.9 mg/dL (ref 0.3–1.2)
Total Protein: 6.6 g/dL (ref 6.5–8.1)

## 2020-12-14 LAB — HIV ANTIBODY (ROUTINE TESTING W REFLEX): HIV Screen 4th Generation wRfx: NONREACTIVE

## 2020-12-14 LAB — CBC WITH DIFFERENTIAL/PLATELET
Abs Immature Granulocytes: 0.04 10*3/uL (ref 0.00–0.07)
Basophils Absolute: 0 10*3/uL (ref 0.0–0.1)
Basophils Relative: 0 %
Eosinophils Absolute: 0 10*3/uL (ref 0.0–0.5)
Eosinophils Relative: 0 %
HCT: 39.1 % (ref 36.0–46.0)
Hemoglobin: 12.2 g/dL (ref 12.0–15.0)
Immature Granulocytes: 1 %
Lymphocytes Relative: 14 %
Lymphs Abs: 1 10*3/uL (ref 0.7–4.0)
MCH: 27.8 pg (ref 26.0–34.0)
MCHC: 31.2 g/dL (ref 30.0–36.0)
MCV: 89.1 fL (ref 80.0–100.0)
Monocytes Absolute: 0.2 10*3/uL (ref 0.1–1.0)
Monocytes Relative: 3 %
Neutro Abs: 6 10*3/uL (ref 1.7–7.7)
Neutrophils Relative %: 82 %
Platelets: 255 10*3/uL (ref 150–400)
RBC: 4.39 MIL/uL (ref 3.87–5.11)
RDW: 15.8 % — ABNORMAL HIGH (ref 11.5–15.5)
WBC: 7.3 10*3/uL (ref 4.0–10.5)
nRBC: 0 % (ref 0.0–0.2)

## 2020-12-14 LAB — MAGNESIUM: Magnesium: 1.8 mg/dL (ref 1.7–2.4)

## 2020-12-14 LAB — PHOSPHORUS: Phosphorus: 4.5 mg/dL (ref 2.5–4.6)

## 2020-12-14 LAB — D-DIMER, QUANTITATIVE: D-Dimer, Quant: 1.15 ug/mL-FEU — ABNORMAL HIGH (ref 0.00–0.50)

## 2020-12-14 LAB — FERRITIN: Ferritin: 150 ng/mL (ref 11–307)

## 2020-12-14 LAB — PROCALCITONIN: Procalcitonin: <0.1 ng/mL

## 2020-12-14 LAB — C-REACTIVE PROTEIN: CRP: <0.5 mg/dL (ref <1.0)

## 2020-12-14 MED ORDER — ENOXAPARIN SODIUM 60 MG/0.6ML IJ SOSY
55.0000 mg | PREFILLED_SYRINGE | INTRAMUSCULAR | Status: DC
  Administered 2020-12-14 – 2020-12-17 (×4): 55 mg via SUBCUTANEOUS
  Filled 2020-12-14 (×4): qty 0.6

## 2020-12-14 MED ORDER — HYDRALAZINE HCL 20 MG/ML IJ SOLN
5.0000 mg | Freq: Four times a day (QID) | INTRAMUSCULAR | Status: DC | PRN
  Administered 2020-12-17 – 2020-12-18 (×2): 5 mg via INTRAVENOUS
  Filled 2020-12-14 (×2): qty 1

## 2020-12-14 MED ORDER — PANTOPRAZOLE SODIUM 40 MG PO TBEC
40.0000 mg | DELAYED_RELEASE_TABLET | Freq: Every day | ORAL | Status: DC
  Administered 2020-12-14 – 2020-12-18 (×5): 40 mg via ORAL
  Filled 2020-12-14 (×5): qty 1

## 2020-12-14 NOTE — Progress Notes (Signed)
PROGRESS NOTE    [name]  OJJ:[id number] DOB: 01/31/1956 DOA: 12/13/2020 PCP: Zachery Dauer, FNP   Chief Complaint  Patient presents with   Shortness of Breath    Brief Narrative:    Whitney Sloan is a 65 y.o. female with medical history significant for hypertension, insulin-dependent diabetes mellitus, BMI 40, and chronic diastolic CHF, now presenting to the emergency department for evaluation of shortness of breath, swelling, and weight gain.  Patient reports increased bilateral lower extremity swelling, swelling into the stomach, 10 pound weight gain, and 2 days of worsening shortness of breath becoming severe today.  Patient reports adherence with 80 mg Lasix daily (though appears to be prescribed twice daily), but notes recent dietary indiscretion while vacationing at the beach.  She has been coughing with some frothy sputum but denies fevers, chills, or chest pain.  She was reportedly saturating in the 1s with EMS and was transported to the ED on CPAP.   ED Course: Upon arrival to the ED, patient is found to be afebrile, saturating upper 90s on BiPAP, tachypneic, slightly tachycardic, and with blood pressure 190/100s.  Chemistry panel notable for glucose 392.  CBC unremarkable.  Troponin 26 and BNP 516.  Chest x-ray concerning for pulmonary edema.  Blood cultures were collected and the patient was started on nitroglycerin infusion, BiPAP, and given 80 mg IV Lasix.  Assessment & Plan:   Principal Problem:   Acute on chronic diastolic (congestive) heart failure (HCC) Active Problems:   Uncontrolled type 2 diabetes mellitus with hyperglycemia, with long-term current use of insulin (HCC)   Hypertensive urgency   COVID-19 virus infection  Acute on chronic diastolic CHF; acute hypoxic respiratory failure  - EF 50-55% with grade 2 diastolic dysfunction in May 2022  - Presents with progressive edema, wt gain,and SOB - Saturating 70s with EMS, BNP 516 and pulm edema on CXR in ED -  Pt acknowledges dietary indiscretion while at the beach a few days ago -Initially requiring BiPAP, she is currently on oxygen . -No further need for nitro drip as blood pressure has improved . -Continue with daily weight, strength and heart, continue with current dose of Lasix 60 mg IV twice daily, monitor daily weight, strict ins and out, and adjust dosing as needed, will continue to monitor daily BMP . - continue beta-blocker and ARB,    SpO2: 98 % O2 Flow Rate (L/min): 20 L/min FiO2 (%): 40 %  COVID-19 infection  -Patient is unvaccinated, COVID PCR is positive, she does report sore throat, and cough  -Think x-ray findings are more related to pulmonary edema than Covid pna. -But given the fact she is with significant oxygen requirement, and unvaccinated, will continue with IV remdesivir and steroids  Acute hypoxic respiratory failure -She remains on 6 L nasal cannula, and this is more related to her CHF than COVID.  Uncontrolled insulin-dependent T2DM  - A1c was 13.0% in May 2022  -CBG remains uncontrolled, but I will hold on increasing her Lantus given the fact she received 1 dose yesterday evening, and 1 dose this morning, and if remains elevated I will increase from tomorrow, meanwhile continue with sliding scale and 6 units before meals.     Hypertensive urgency  - BP was 191/104 with EMS   -This has improved, continue with losartan, metoprolol and diltiazem, she is on IV Lasix as well, will add as needed hydralazine     DVT prophylaxis: Lovenox Code Status: Full Family Communication: None at bedside  Disposition:   Status is: Inpatient  Remains inpatient appropriate because:IV treatments appropriate due to intensity of illness or inability to take PO  Dispo: The patient is from: Home              Anticipated d/c is to: Home              Patient currently is not medically stable to d/c.   Difficult to place patient No       Consultants:   None   Subjective:  Report dyspnea, cough, she denies nausea vomiting or chest pain  Objective: Vitals:   12/14/20 0542 12/14/20 0820 12/14/20 1100 12/14/20 1231  BP: (!) 167/92 (!) 154/86  (!) 152/84  Pulse: 82 87 93 85  Resp: 16 18 16 18   Temp: 98.6 F (37 C) 98.5 F (36.9 C)  98.6 F (37 C)  TempSrc: Oral Oral  Oral  SpO2: 99% 99% 96% 98%  Weight: 115.6 kg     Height: 5\' 5"  (1.651 m)       Intake/Output Summary (Last 24 hours) at 12/14/2020 1338 Last data filed at 12/14/2020 1100 Gross per 24 hour  Intake 361.88 ml  Output 550 ml  Net -188.12 ml   Filed Weights   12/13/20 1753 12/14/20 0542  Weight: 109.8 kg 115.6 kg    Examination:  Awake Alert, Oriented X 3, No new F.N deficits, Normal affect Symmetrical Chest wall movement, Good air movement bilaterally, bibasilar crackles RRR,No Gallops,Rubs or new Murmurs, No Parasternal Heave +ve B.Sounds, Abd Soft, No tenderness, No rebound - guarding or rigidity. No Cyanosis, Clubbing, +2 edema    Data Reviewed: I have personally reviewed following labs and imaging studies  CBC: Recent Labs  Lab 12/13/20 1754 12/13/20 1802 12/13/20 1826 12/14/20 0328  WBC 8.2  --   --  7.3  NEUTROABS 6.3  --   --  6.0  HGB 12.7 13.3 14.3 12.2  HCT 41.5 39.0 42.0 39.1  MCV 88.3  --   --  89.1  PLT 264  --   --  255    Basic Metabolic Panel: Recent Labs  Lab 12/13/20 1754 12/13/20 1802 12/13/20 1826 12/14/20 0328  NA 140 140 141 139  K 3.8 3.5 3.8 4.1  CL 102  --  100 104  CO2 30  --   --  26  GLUCOSE 302*  --  303* 378*  BUN 9  --  10 14  CREATININE 0.81  --  0.60 0.92  CALCIUM 8.8*  --   --  8.4*  MG  --   --   --  1.8  PHOS  --   --   --  4.5    GFR: Estimated Creatinine Clearance: 77.4 mL/min (by C-G formula based on SCr of 0.92 mg/dL).  Liver Function Tests: Recent Labs  Lab 12/13/20 1754 12/14/20 0328  AST 18 17  ALT 15 13  ALKPHOS 76 67  BILITOT 0.8 0.9  PROT 6.9 6.6  ALBUMIN 2.9* 2.6*     CBG: Recent Labs  Lab 12/13/20 1923 12/13/20 2237 12/14/20 0538 12/14/20 1229  GLUCAP 265* 313* 364* 332*     Recent Results (from the past 240 hour(s))  Culture, blood (routine x 2)     Status: None (Preliminary result)   Collection Time: 12/13/20  6:20 PM   Specimen: BLOOD RIGHT FOREARM  Result Value Ref Range Status   Specimen Description BLOOD RIGHT FOREARM  Final   Special Requests  Final    BOTTLES DRAWN AEROBIC AND ANAEROBIC Blood Culture results may not be optimal due to an inadequate volume of blood received in culture bottles   Culture   Final    NO GROWTH < 24 HOURS Performed at [facility], [address] [place], [place] [address]    Report Status PENDING  Incomplete  Culture, blood (routine x 2)     Status: None (Preliminary result)   Collection Time: 12/13/20  6:23 PM   Specimen: BLOOD  Result Value Ref Range Status   Specimen Description BLOOD RIGHT ANTECUBITAL  Final   Special Requests   Final    BOTTLES DRAWN AEROBIC AND ANAEROBIC Blood Culture adequate volume   Culture   Final    NO GROWTH < 24 HOURS Performed at [facility], [address] [place], [place] [address]    Report Status PENDING  Incomplete  Resp Panel by RT-PCR (Flu A&B, Covid) Nasopharyngeal Swab     Status: Abnormal   Collection Time: 12/13/20  7:20 PM   Specimen: Nasopharyngeal Swab; Nasopharyngeal(NP) swabs in vial transport medium  Result Value Ref Range Status   SARS Coronavirus 2 by RT PCR POSITIVE (A) NEGATIVE Final    Comment: RESULT CALLED TO, READ BACK BY AND VERIFIED WITH: RN CANDICE NUCKLES BY MESSAN H. AT 2052 ON 12/13/2020 (NOTE) SARS-CoV-2 target nucleic acids are DETECTED.  The SARS-CoV-2 RNA is generally detectable in upper respiratory specimens during the acute phase of infection. Positive results are indicative of the presence of the identified virus, but do not rule out bacterial infection or co-infection with other pathogens not detected by  the test. Clinical correlation with patient history and other diagnostic information is necessary to determine patient infection status. The expected result is Negative.  Fact Sheet for Patients: BloggerCourse.com  Fact Sheet for Healthcare Providers: SeriousBroker.it  This test is not yet approved or cleared by the [place] FDA and  has been authorized for detection and/or diagnosis of SARS-CoV-2 by FDA under an Emergency Use Authorization (EUA).  This EUA will remain in effect (meanin g this test can be used) for the duration of  the COVID-19 declaration under Section 564(b)(1) of the Act, 21 U.S.C. section 360bbb-3(b)(1), unless the authorization is terminated or revoked sooner.     Influenza A by PCR NEGATIVE NEGATIVE Final   Influenza B by PCR NEGATIVE NEGATIVE Final    Comment: (NOTE) The Xpert Xpress SARS-CoV-2/FLU/RSV plus assay is intended as an aid in the diagnosis of influenza from Nasopharyngeal swab specimens and should not be used as a sole basis for treatment. Nasal washings and aspirates are unacceptable for Xpert Xpress SARS-CoV-2/FLU/RSV testing.  Fact Sheet for Patients: BloggerCourse.com  Fact Sheet for Healthcare Providers: SeriousBroker.it  This test is not yet approved or cleared by the [place] FDA and has been authorized for detection and/or diagnosis of SARS-CoV-2 by FDA under an Emergency Use Authorization (EUA). This EUA will remain in effect (meaning this test can be used) for the duration of the COVID-19 declaration under Section 564(b)(1) of the Act, 21 U.S.C. section 360bbb-3(b)(1), unless the authorization is terminated or revoked.  Performed at [facility], [address], [place], [place] [address]          Radiology Studies: DG Chest Port 1 View  Result Date: 12/13/2020 CLINICAL DATA:  Dyspnea.  Shortness of breath.  EXAM: PORTABLE CHEST 1 VIEW COMPARISON:  12/10/2019 FINDINGS: Heart is mildly enlarged, stable in configuration.  There is increased interstitial pulmonary edema compared to prior study. More confluent opacity at the LEFT lung base likely represents pulmonary edema but infectious infiltrate could have a similar appearance. IMPRESSION: Findings consistent with pulmonary edema. Electronically Signed   By: Norva Pavlov M.D.   On: 12/13/2020 18:58        Scheduled Meds:  aspirin  81 mg Oral BH-q7a   atorvastatin  80 mg Oral Daily   diltiazem  240 mg Oral Daily   enoxaparin (LOVENOX) injection  55 mg Subcutaneous Q24H   furosemide  60 mg Intravenous Q12H   insulin aspart  0-15 Units Subcutaneous TID WC   insulin aspart  0-5 Units Subcutaneous QHS   insulin aspart  6 Units Subcutaneous TID WC   insulin glargine  30 Units Subcutaneous Daily   losartan  100 mg Oral Daily   methylPREDNISolone (SOLU-MEDROL) injection  0.5 mg/kg Intravenous Q12H   Followed by   Melene Muller ON 12/17/2020] predniSONE  50 mg Oral Daily   metoprolol succinate  50 mg Oral Daily   potassium chloride SA  20 mEq Oral Daily   tamsulosin  0.4 mg Oral Daily   Continuous Infusions:  sodium chloride     remdesivir 100 mg in NS 100 mL 100 mg (12/14/20 0940)     LOS: 1 day      Huey Bienenstock, MD [facility]   To contact the attending provider between 7A-7P or the covering provider during after hours 7P-7A, please log into the web site [email or web address] and access using universal [place] password for that web site. If you do not have the password, please call the hospital operator.  12/14/2020, 1:38 PM

## 2020-12-14 NOTE — ED Notes (Signed)
Report called  

## 2020-12-14 NOTE — Progress Notes (Signed)
Patient arrived to [facility] [address] from ED. Patient AXOX4. Plan of care reviewd with patient and daughter. CCMD notified. All questions answered.

## 2020-12-14 NOTE — ED Notes (Signed)
Attempted report x1, nurse unavailable.

## 2020-12-15 DIAGNOSIS — I5033 Acute on chronic diastolic (congestive) heart failure: Secondary | ICD-10-CM | POA: Diagnosis not present

## 2020-12-15 DIAGNOSIS — Z794 Long term (current) use of insulin: Secondary | ICD-10-CM | POA: Diagnosis not present

## 2020-12-15 DIAGNOSIS — I160 Hypertensive urgency: Secondary | ICD-10-CM | POA: Diagnosis not present

## 2020-12-15 DIAGNOSIS — U071 COVID-19: Secondary | ICD-10-CM | POA: Diagnosis not present

## 2020-12-15 DIAGNOSIS — E1165 Type 2 diabetes mellitus with hyperglycemia: Secondary | ICD-10-CM | POA: Diagnosis not present

## 2020-12-15 LAB — COMPREHENSIVE METABOLIC PANEL
ALT: 13 U/L (ref 0–44)
AST: 18 U/L (ref 15–41)
Albumin: 2.5 g/dL — ABNORMAL LOW (ref 3.5–5.0)
Alkaline Phosphatase: 60 U/L (ref 38–126)
Anion gap: 7 (ref 5–15)
BUN: 26 mg/dL — ABNORMAL HIGH (ref 8–23)
CO2: 30 mmol/L (ref 22–32)
Calcium: 8.3 mg/dL — ABNORMAL LOW (ref 8.9–10.3)
Chloride: 101 mmol/L (ref 98–111)
Creatinine, Ser: 0.94 mg/dL (ref 0.44–1.00)
GFR, Estimated: >60 mL/min (ref >60)
Glucose, Bld: 357 mg/dL — ABNORMAL HIGH (ref 70–99)
Potassium: 3.8 mmol/L (ref 3.5–5.1)
Sodium: 138 mmol/L (ref 135–145)
Total Bilirubin: 0.8 mg/dL (ref 0.3–1.2)
Total Protein: 6.3 g/dL — ABNORMAL LOW (ref 6.5–8.1)

## 2020-12-15 LAB — GLUCOSE, CAPILLARY
Glucose-Capillary: 324 mg/dL — ABNORMAL HIGH (ref 70–99)
Glucose-Capillary: 309 mg/dL — ABNORMAL HIGH (ref 70–99)
Glucose-Capillary: 332 mg/dL — ABNORMAL HIGH (ref 70–99)
Glucose-Capillary: 360 mg/dL — ABNORMAL HIGH (ref 70–99)

## 2020-12-15 LAB — C-REACTIVE PROTEIN: CRP: <0.5 mg/dL (ref <1.0)

## 2020-12-15 LAB — CBC WITH DIFFERENTIAL/PLATELET
Abs Immature Granulocytes: 0.07 10*3/uL (ref 0.00–0.07)
Basophils Absolute: 0 10*3/uL (ref 0.0–0.1)
Basophils Relative: 0 %
Eosinophils Absolute: 0 10*3/uL (ref 0.0–0.5)
Eosinophils Relative: 0 %
HCT: 37.9 % (ref 36.0–46.0)
Hemoglobin: 12.2 g/dL (ref 12.0–15.0)
Immature Granulocytes: 1 %
Lymphocytes Relative: 13 %
Lymphs Abs: 1.7 10*3/uL (ref 0.7–4.0)
MCH: 27.8 pg (ref 26.0–34.0)
MCHC: 32.2 g/dL (ref 30.0–36.0)
MCV: 86.3 fL (ref 80.0–100.0)
Monocytes Absolute: 1 10*3/uL (ref 0.1–1.0)
Monocytes Relative: 8 %
Neutro Abs: 10 10*3/uL — ABNORMAL HIGH (ref 1.7–7.7)
Neutrophils Relative %: 78 %
Platelets: 245 10*3/uL (ref 150–400)
RBC: 4.39 MIL/uL (ref 3.87–5.11)
RDW: 15.6 % — ABNORMAL HIGH (ref 11.5–15.5)
WBC: 12.8 10*3/uL — ABNORMAL HIGH (ref 4.0–10.5)
nRBC: 0 % (ref 0.0–0.2)

## 2020-12-15 LAB — PHOSPHORUS: Phosphorus: 4 mg/dL (ref 2.5–4.6)

## 2020-12-15 LAB — D-DIMER, QUANTITATIVE: D-Dimer, Quant: 0.62 ug/mL-FEU — ABNORMAL HIGH (ref 0.00–0.50)

## 2020-12-15 LAB — MAGNESIUM: Magnesium: 2 mg/dL (ref 1.7–2.4)

## 2020-12-15 LAB — FERRITIN: Ferritin: 166 ng/mL (ref 11–307)

## 2020-12-15 MED ORDER — FUROSEMIDE 10 MG/ML IJ SOLN
60.0000 mg | Freq: Three times a day (TID) | INTRAMUSCULAR | Status: DC
  Administered 2020-12-15 – 2020-12-17 (×7): 60 mg via INTRAVENOUS
  Filled 2020-12-15 (×8): qty 6

## 2020-12-15 MED ORDER — INSULIN GLARGINE 100 UNIT/ML ~~LOC~~ SOLN
45.0000 [IU] | Freq: Every day | SUBCUTANEOUS | Status: DC
  Administered 2020-12-16 – 2020-12-18 (×3): 45 [IU] via SUBCUTANEOUS
  Filled 2020-12-15 (×3): qty 0.45

## 2020-12-15 MED ORDER — FUROSEMIDE 10 MG/ML IJ SOLN: 80.0000 mg | Freq: Two times a day (BID) | INTRAMUSCULAR | Status: DC

## 2020-12-15 MED ORDER — METOLAZONE 2.5 MG PO TABS
2.5000 mg | ORAL_TABLET | Freq: Once | ORAL | Status: AC
  Administered 2020-12-15: 2.5 mg via ORAL
  Filled 2020-12-15: qty 1

## 2020-12-15 MED ORDER — INSULIN GLARGINE 100 UNIT/ML ~~LOC~~ SOLN
15.0000 [IU] | Freq: Once | SUBCUTANEOUS | Status: AC
  Administered 2020-12-15: 15 [IU] via SUBCUTANEOUS
  Filled 2020-12-15: qty 0.15

## 2020-12-15 MED ORDER — LIVING WELL WITH DIABETES BOOK
Freq: Once | Status: AC
  Filled 2020-12-15: qty 1

## 2020-12-15 NOTE — Progress Notes (Signed)
PROGRESS NOTE    Whitney Sloan  YYQ:[id number] DOB: 30-Jun-1955 DOA: 12/13/2020 PCP: Zachery Dauer, FNP   Chief Complaint  Patient presents with   Shortness of Breath    Brief Narrative:    Whitney Sloan is a 65 y.o. female with medical history significant for hypertension, insulin-dependent diabetes mellitus, BMI 40, and chronic diastolic CHF, now presenting to the emergency department for evaluation of shortness of breath, swelling, and weight gain.  Patient reports increased bilateral lower extremity swelling, swelling into the stomach, 10 pound weight gain, and 2 days of worsening shortness of breath becoming severe today.  Patient reports adherence with 80 mg Lasix daily (though appears to be prescribed twice daily), but notes recent dietary indiscretion while vacationing at the beach.  Her work-up significant for acute on chronic diastolic CHF, as well she tested positive for COVID-19, she is unvaccinated.   Assessment & Plan:   Principal Problem:   Acute on chronic diastolic (congestive) heart failure (HCC) Active Problems:   Uncontrolled type 2 diabetes mellitus with hyperglycemia, with long-term current use of insulin (HCC)   Hypertensive urgency   COVID-19 virus infection  Acute on chronic diastolic CHF; acute hypoxic respiratory failure  - EF 50-55% with grade 2 diastolic dysfunction in May 2022  - Presents with progressive edema, wt gain,and SOB - Saturating 70s with EMS, BNP 516 and pulm edema on CXR in ED - Pt acknowledges dietary indiscretion while at the beach a few days ago -Initially requiring BiPAP, she is currently on oxygen . -No further need for nitro drip as blood pressure has improved . -Continue with IV diuresis, weight with no significant change, only with minimal negative balance over last 24 hours, I will increase her Lasix to 60 mg IV 3 times daily, and I will give lower dose of metolazone today, as well I have counseled her about importance of fluid  restrictions . - continue beta-blocker and ARB,  -Continue to wean oxygen as tolerated, I have managed to lower her to 2 L nasal cannula today, will continue to wean as tolerated.   SpO2: 99 % O2 Flow Rate (L/min): 2 L/min (changed by MD at bedside) FiO2 (%): 40 %  COVID-19 infection  -Patient is unvaccinated, COVID PCR is positive, she does report sore throat, and cough  - x-ray findings are more related to pulmonary edema than Covid pna. -But given the fact she is with significant oxygen requirement, and unvaccinated, will continue with IV remdesivir and steroids  Acute hypoxic respiratory failure -Wean as tolerated, she is currently on 2 L nasal cannula.  Uncontrolled insulin-dependent T2DM  - A1c was 13.0% in May 2022  -CBG remains significantly elevated, most likely in setting of steroids, will increase her Lantus from 30 > 45 units, continue with insulin sliding scale and 6 units before meals .   Hypertensive urgency  - BP was 191/104 with EMS   -This has improved, continue with losartan, metoprolol and diltiazem, she is on IV Lasix as well, will add as needed hydralazine     DVT prophylaxis: Lovenox Code Status: Full Family Communication: D/W daughter Nehemiah Settle by phone Disposition:   Status is: Inpatient  Remains inpatient appropriate because:IV treatments appropriate due to intensity of illness or inability to take PO  Dispo: The patient is from: Home              Anticipated d/c is to: Home  Patient currently is not medically stable to d/c.   Difficult to place patient No       Consultants:  None   Subjective:  Report dyspnea, cough, she denies nausea vomiting or chest pain  Objective: Vitals:   12/15/20 0400 12/15/20 0500 12/15/20 0745 12/15/20 0800  BP: 131/74  (!) 145/76 (!) 142/84  Pulse: 66  84 80  Resp: 19  19 20   Temp: 97.7 F (36.5 C)  97.6 F (36.4 C)   TempSrc: Oral  Oral   SpO2: 97%  99% 99%  Weight:  116.2 kg    Height:         Intake/Output Summary (Last 24 hours) at 12/15/2020 1202 Last data filed at 12/15/2020 02/15/2021 Gross per 24 hour  Intake 720 ml  Output 750 ml  Net -30 ml   Filed Weights   12/13/20 1753 12/14/20 0542 12/15/20 0500  Weight: 109.8 kg 115.6 kg 116.2 kg    Examination:  Awake Alert, Oriented X 3, No new F.N deficits, Normal affect Symmetrical Chest wall movement, Good air movement bilaterally, crackles at the bases RRR,No Gallops,Rubs or new Murmurs, No Parasternal Heave +ve B.Sounds, Abd Soft, No tenderness, No rebound - guarding or rigidity. No Cyanosis, Clubbing ,+2  edema, No new Rash or bruise      Data Reviewed: I have personally reviewed following labs and imaging studies  CBC: Recent Labs  Lab 12/13/20 1754 12/13/20 1802 12/13/20 1826 12/14/20 0328 12/15/20 0329  WBC 8.2  --   --  7.3 12.8*  NEUTROABS 6.3  --   --  6.0 10.0*  HGB 12.7 13.3 14.3 12.2 12.2  HCT 41.5 39.0 42.0 39.1 37.9  MCV 88.3  --   --  89.1 86.3  PLT 264  --   --  255 245    Basic Metabolic Panel: Recent Labs  Lab 12/13/20 1754 12/13/20 1802 12/13/20 1826 12/14/20 0328 12/15/20 0329  NA 140 140 141 139 138  K 3.8 3.5 3.8 4.1 3.8  CL 102  --  100 104 101  CO2 30  --   --  26 30  GLUCOSE 302*  --  303* 378* 357*  BUN 9  --  10 14 26*  CREATININE 0.81  --  0.60 0.92 0.94  CALCIUM 8.8*  --   --  8.4* 8.3*  MG  --   --   --  1.8 2.0  PHOS  --   --   --  4.5 4.0    GFR: Estimated Creatinine Clearance: 76 mL/min (by C-G formula based on SCr of 0.94 mg/dL).  Liver Function Tests: Recent Labs  Lab 12/13/20 1754 12/14/20 0328 12/15/20 0329  AST 18 17 18   ALT 15 13 13   ALKPHOS 76 67 60  BILITOT 0.8 0.9 0.8  PROT 6.9 6.6 6.3*  ALBUMIN 2.9* 2.6* 2.5*    CBG: Recent Labs  Lab 12/14/20 1229 12/14/20 1702 12/14/20 2122 12/15/20 0539 12/15/20 1111  GLUCAP 332* 325* 304* 324* 309*     Recent Results (from the past 240 hour(s))  Culture, blood (routine x 2)     Status: None  (Preliminary result)   Collection Time: 12/13/20  6:20 PM   Specimen: BLOOD RIGHT FOREARM  Result Value Ref Range Status   Specimen Description BLOOD RIGHT FOREARM  Final   Special Requests   Final    BOTTLES DRAWN AEROBIC AND ANAEROBIC Blood Culture results may not be optimal due to an inadequate volume of blood  received in culture bottles   Culture   Final    NO GROWTH 2 DAYS Performed at [facility], [address], [place], [place] [address]    Report Status PENDING  Incomplete  Culture, blood (routine x 2)     Status: None (Preliminary result)   Collection Time: 12/13/20  6:23 PM   Specimen: BLOOD  Result Value Ref Range Status   Specimen Description BLOOD RIGHT ANTECUBITAL  Final   Special Requests   Final    BOTTLES DRAWN AEROBIC AND ANAEROBIC Blood Culture adequate volume   Culture   Final    NO GROWTH 2 DAYS Performed at [facility], [address] [place], [place] [address]    Report Status PENDING  Incomplete  Resp Panel by RT-PCR (Flu A&B, Covid) Nasopharyngeal Swab     Status: Abnormal   Collection Time: 12/13/20  7:20 PM   Specimen: Nasopharyngeal Swab; Nasopharyngeal(NP) swabs in vial transport medium  Result Value Ref Range Status   SARS Coronavirus 2 by RT PCR POSITIVE (A) NEGATIVE Final    Comment: RESULT CALLED TO, READ BACK BY AND VERIFIED WITH: RN CANDICE NUCKLES BY MESSAN H. AT 2052 ON 12/13/2020 (NOTE) SARS-CoV-2 target nucleic acids are DETECTED.  The SARS-CoV-2 RNA is generally detectable in upper respiratory specimens during the acute phase of infection. Positive results are indicative of the presence of the identified virus, but do not rule out bacterial infection or co-infection with other pathogens not detected by the test. Clinical correlation with patient history and other diagnostic information is necessary to determine patient infection status. The expected result is Negative.  Fact Sheet for  Patients: BloggerCourse.com  Fact Sheet for Healthcare Providers: SeriousBroker.it  This test is not yet approved or cleared by the [place] FDA and  has been authorized for detection and/or diagnosis of SARS-CoV-2 by FDA under an Emergency Use Authorization (EUA).  This EUA will remain in effect (meanin g this test can be used) for the duration of  the COVID-19 declaration under Section 564(b)(1) of the Act, 21 U.S.C. section 360bbb-3(b)(1), unless the authorization is terminated or revoked sooner.     Influenza A by PCR NEGATIVE NEGATIVE Final   Influenza B by PCR NEGATIVE NEGATIVE Final    Comment: (NOTE) The Xpert Xpress SARS-CoV-2/FLU/RSV plus assay is intended as an aid in the diagnosis of influenza from Nasopharyngeal swab specimens and should not be used as a sole basis for treatment. Nasal washings and aspirates are unacceptable for Xpert Xpress SARS-CoV-2/FLU/RSV testing.  Fact Sheet for Patients: BloggerCourse.com  Fact Sheet for Healthcare Providers: SeriousBroker.it  This test is not yet approved or cleared by the [place] FDA and has been authorized for detection and/or diagnosis of SARS-CoV-2 by FDA under an Emergency Use Authorization (EUA). This EUA will remain in effect (meaning this test can be used) for the duration of the COVID-19 declaration under Section 564(b)(1) of the Act, 21 U.S.C. section 360bbb-3(b)(1), unless the authorization is terminated or revoked.  Performed at [facility], [address], [place], [place] [address]          Radiology Studies: DG Chest Port 1 View  Result Date: 12/13/2020 CLINICAL DATA:  Dyspnea.  Shortness of breath. EXAM: PORTABLE CHEST 1 VIEW COMPARISON:  12/10/2019 FINDINGS: Heart is mildly enlarged, stable in configuration. There is increased interstitial pulmonary edema compared to prior study.  More confluent opacity at the LEFT lung base likely represents pulmonary edema but infectious infiltrate  could have a similar appearance. IMPRESSION: Findings consistent with pulmonary edema. Electronically Signed   By: Norva Pavlov M.D.   On: 12/13/2020 18:58        Scheduled Meds:  aspirin  81 mg Oral BH-q7a   atorvastatin  80 mg Oral Daily   diltiazem  240 mg Oral Daily   enoxaparin (LOVENOX) injection  55 mg Subcutaneous Q24H   furosemide  60 mg Intravenous Q8H   insulin aspart  0-15 Units Subcutaneous TID WC   insulin aspart  0-5 Units Subcutaneous QHS   insulin aspart  6 Units Subcutaneous TID WC   insulin glargine  15 Units Subcutaneous Once   [START ON 12/16/2020] insulin glargine  45 Units Subcutaneous Daily   living well with diabetes book   Does not apply Once   losartan  100 mg Oral Daily   methylPREDNISolone (SOLU-MEDROL) injection  0.5 mg/kg Intravenous Q12H   Followed by   Melene Muller ON 12/17/2020] predniSONE  50 mg Oral Daily   metoprolol succinate  50 mg Oral Daily   pantoprazole  40 mg Oral Daily   potassium chloride SA  20 mEq Oral Daily   tamsulosin  0.4 mg Oral Daily   Continuous Infusions:  sodium chloride     remdesivir 100 mg in NS 100 mL Stopped (12/14/20 1753)     LOS: 2 days      Huey Bienenstock, MD [facility]   To contact the attending provider between 7A-7P or the covering provider during after hours 7P-7A, please log into the web site [email or web address] and access using universal [place] password for that web site. If you do not have the password, please call the hospital operator.  12/15/2020, 12:02 PM

## 2020-12-15 NOTE — Progress Notes (Signed)
Inpatient Diabetes Program Recommendations  AACE/ADA: New Consensus Statement on Inpatient Glycemic Control (2015)  Target Ranges:  Prepandial:   less than 140 mg/dL      Peak postprandial:   less than 180 mg/dL (1-2 hours)      Critically ill patients:  140 - 180 mg/dL   Lab Results  Component Value Date   GLUCAP 324 (H) 12/15/2020   HGBA1C 13.0 (A) 10/14/2020    Review of Glycemic Control Results for Whitney Sloan, Whitney Sloan (MRN [id number]) as of 12/15/2020 09:40  Ref. Range 12/14/2020 05:38 12/14/2020 12:29 12/14/2020 17:02 12/14/2020 21:22 12/15/2020 05:39  Glucose-Capillary Latest Ref Range: 70 - 99 mg/dL 616 (H) 073 (H) 710 (H) 304 (H) 324 (H)   Diabetes history: DM2 Outpatient Diabetes medications: Lantus 40 units QD, Novolog 18 units TID, Metformin 1000 mg BID, Rybelsus 7 mg QD Current orders for Inpatient glycemic control: Lantus 30 units QD, Novolog 6 units TID, Novolog 0-15 units TID and 0-5 QHS, Solumedrol 55 mg Q12H  Inpatient Diabetes Program Recommendations:  While steroids continue, please consider:  Lantus 25 BID Will speak with patient regarding A1C of 13%.  Ordered LWWD booklet.    Will continue to follow while inpatient.  Thank you, Dulce Sellar, RN, BSN Diabetes Coordinator Inpatient Diabetes Program [phone] (team pager from 8a-5p)

## 2020-12-15 NOTE — Evaluation (Signed)
Physical Therapy Evaluation Patient Details Name: Whitney Sloan MRN: [id number] DOB: [birth year] Today's Date: 12/15/2020   History of Present Illness  65yo female admitted 12/13/20 c/o BLE edema, weight gain, severe SOB. Found to be in acute on chronic CHF, with acute hypoxic respiratory failure, and Covid positive. PMH CHF, DM, HLD, HTN  Clinical Impression   Patient received in bed, very tangential and requiring frequent redirection, but very pleasant. Able to mobilize on a supervision basis with RW but does need general cues for sequencing especially with transfers. Fairly steady with RW. Spo2 86-95% on RA, HR and rhythm stable. Left sitting at EOB with all needs met, bed alarm active. Will benefit from skilled HHPT f/u at DC.     Follow Up Recommendations Home health PT    Equipment Recommendations  Rolling walker with 5" wheels;3in1 (PT)    Recommendations for Other Services       Precautions / Restrictions Precautions Precautions: Fall;Other (comment) Precaution Comments: watch sats Restrictions Weight Bearing Restrictions: No      Mobility  Bed Mobility Overal bed mobility: Modified Independent             General bed mobility comments: HOB elevated, use of rail    Transfers Overall transfer level: Needs assistance Equipment used: Rolling walker (2 wheeled) Transfers: Sit to/from Stand Sit to Stand: Supervision         General transfer comment: S for safety, no physical assist given beyond line management; did need cues for safety and hand placement with all transfers  Ambulation/Gait Ambulation/Gait assistance: Supervision Gait Distance (Feet): 70 Feet (laps in room) Assistive device: Rolling walker (2 wheeled) Gait Pattern/deviations: Step-through pattern Gait velocity: decreased   General Gait Details: slow but steady wtih RW, did need occasional cues for optimal proximity from device; SPO2 86-95% on RA, HR stable  Stairs             Wheelchair Mobility    Modified Rankin (Stroke Patients Only)       Balance Overall balance assessment: Mild deficits observed, not formally tested                                           Pertinent Vitals/Pain Pain Assessment: No/denies pain    Home Living Family/patient expects to be discharged to:: Private residence Living Arrangements: Children (youngest daughter) Available Help at Discharge: Family;Available PRN/intermittently Type of Home: Apartment Home Access: Level entry     Home Layout: One level Home Equipment: Walker - 4 wheels;Shower seat;Cane - quad Additional Comments: has had some falls- oe was when she was coming out of a restaurant and cane slid, other fall she was walking down a hill and fell    Prior Function Level of Independence: Independent with assistive device(s)         Comments: does not wear O2 normally     Hand Dominance   Dominant Hand: Right    Extremity/Trunk Assessment   Upper Extremity Assessment Upper Extremity Assessment: Defer to OT evaluation    Lower Extremity Assessment Lower Extremity Assessment: Generalized weakness    Cervical / Trunk Assessment Cervical / Trunk Assessment: Normal  Communication   Communication: No difficulties  Cognition Arousal/Alertness: Awake/alert Behavior During Therapy: WFL for tasks assessed/performed Overall Cognitive Status: Within Functional Limits for tasks assessed  General Comments: but tangential and easily distracted, laughs at inappropriate moments; question how close she is to her baseline. Very pleasant!      General Comments      Exercises     Assessment/Plan    PT Assessment Patient needs continued PT services  PT Problem List Decreased strength;Decreased knowledge of use of DME;Obesity;Decreased activity tolerance;Decreased safety awareness;Decreased balance;Decreased mobility;Cardiopulmonary status  limiting activity;Decreased coordination       PT Treatment Interventions DME instruction;Balance training;Gait training;Functional mobility training;Patient/family education;Therapeutic activities;Therapeutic exercise    PT Goals (Current goals can be found in the Care Plan section)  Acute Rehab PT Goals Patient Stated Goal: go home when medically ready PT Goal Formulation: With patient Time For Goal Achievement: 12/29/20 Potential to Achieve Goals: Good    Frequency Min 3X/week   Barriers to discharge        Co-evaluation               AM-PAC PT "6 Clicks" Mobility  Outcome Measure Help needed turning from your back to your side while in a flat bed without using bedrails?: None Help needed moving from lying on your back to sitting on the side of a flat bed without using bedrails?: A Little Help needed moving to and from a bed to a chair (including a wheelchair)?: A Little Help needed standing up from a chair using your arms (e.g., wheelchair or bedside chair)?: A Little Help needed to walk in hospital room?: A Little Help needed climbing 3-5 steps with a railing? : A Little 6 Click Score: 19    End of Session   Activity Tolerance: Patient tolerated treatment well Patient left: in bed;with call bell/phone within reach;with bed alarm set (sitting at EOB per her request) Nurse Communication: Mobility status PT Visit Diagnosis: Muscle weakness (generalized) (M62.81);History of falling (Z91.81);Unsteadiness on feet (R26.81)    Time: 6213-0865 PT Time Calculation (min) (ACUTE ONLY): 28 min   Charges:   PT Evaluation $PT Eval Moderate Complexity: 1 Mod (medicaid eval)         Windell Norfolk, DPT, PN1   Supplemental Physical Therapist [place]    Pager [phone] Acute Rehab Office [phone]

## 2020-12-15 NOTE — Progress Notes (Signed)
Please see in addition to PT note:   SATURATION QUALIFICATIONS: (This note is used to comply with regulatory documentation for home oxygen)   Patient Saturations on Room Air at Rest = 86%   Patient Saturations on Room Air while Ambulating = 95%   Patient Saturations on 2 Liters of oxygen while Ambulating = DNT, in 90s ambulating on room air    Please briefly explain why patient needs home oxygen: intermitted Spo2 desaturation into 80s on room air   Madelaine Etienne, DPT, PN1   Supplemental Physical Therapist [facility]    Pager [phone] Acute Rehab Office [phone]

## 2020-12-16 ENCOUNTER — Other Ambulatory Visit (HOSPITAL_COMMUNITY): Payer: Self-pay

## 2020-12-16 DIAGNOSIS — I5033 Acute on chronic diastolic (congestive) heart failure: Secondary | ICD-10-CM | POA: Diagnosis not present

## 2020-12-16 LAB — GLUCOSE, CAPILLARY
Glucose-Capillary: 353 mg/dL — ABNORMAL HIGH (ref 70–99)
Glucose-Capillary: 394 mg/dL — ABNORMAL HIGH (ref 70–99)
Glucose-Capillary: 352 mg/dL — ABNORMAL HIGH (ref 70–99)
Glucose-Capillary: 376 mg/dL — ABNORMAL HIGH (ref 70–99)

## 2020-12-16 LAB — CBC WITH DIFFERENTIAL/PLATELET
Abs Immature Granulocytes: 0.14 10*3/uL — ABNORMAL HIGH (ref 0.00–0.07)
Basophils Absolute: 0 10*3/uL (ref 0.0–0.1)
Basophils Relative: 0 %
Eosinophils Absolute: 0 10*3/uL (ref 0.0–0.5)
Eosinophils Relative: 0 %
HCT: 37.6 % (ref 36.0–46.0)
Hemoglobin: 11.9 g/dL — ABNORMAL LOW (ref 12.0–15.0)
Immature Granulocytes: 1 %
Lymphocytes Relative: 12 %
Lymphs Abs: 1.8 10*3/uL (ref 0.7–4.0)
MCH: 27.3 pg (ref 26.0–34.0)
MCHC: 31.6 g/dL (ref 30.0–36.0)
MCV: 86.2 fL (ref 80.0–100.0)
Monocytes Absolute: 0.7 10*3/uL (ref 0.1–1.0)
Monocytes Relative: 5 %
Neutro Abs: 12 10*3/uL — ABNORMAL HIGH (ref 1.7–7.7)
Neutrophils Relative %: 82 %
Platelets: 243 10*3/uL (ref 150–400)
RBC: 4.36 MIL/uL (ref 3.87–5.11)
RDW: 15.7 % — ABNORMAL HIGH (ref 11.5–15.5)
WBC: 14.7 10*3/uL — ABNORMAL HIGH (ref 4.0–10.5)
nRBC: 0 % (ref 0.0–0.2)

## 2020-12-16 LAB — COMPREHENSIVE METABOLIC PANEL
ALT: 16 U/L (ref 0–44)
AST: 19 U/L (ref 15–41)
Albumin: 2.5 g/dL — ABNORMAL LOW (ref 3.5–5.0)
Alkaline Phosphatase: 61 U/L (ref 38–126)
Anion gap: 11 (ref 5–15)
BUN: 33 mg/dL — ABNORMAL HIGH (ref 8–23)
CO2: 30 mmol/L (ref 22–32)
Calcium: 8.5 mg/dL — ABNORMAL LOW (ref 8.9–10.3)
Chloride: 95 mmol/L — ABNORMAL LOW (ref 98–111)
Creatinine, Ser: 1.11 mg/dL — ABNORMAL HIGH (ref 0.44–1.00)
GFR, Estimated: 55 mL/min — ABNORMAL LOW (ref >60)
Glucose, Bld: 379 mg/dL — ABNORMAL HIGH (ref 70–99)
Potassium: 3.3 mmol/L — ABNORMAL LOW (ref 3.5–5.1)
Sodium: 136 mmol/L (ref 135–145)
Total Bilirubin: 0.4 mg/dL (ref 0.3–1.2)
Total Protein: 6.2 g/dL — ABNORMAL LOW (ref 6.5–8.1)

## 2020-12-16 LAB — C-REACTIVE PROTEIN: CRP: 0.5 mg/dL (ref <1.0)

## 2020-12-16 LAB — FERRITIN: Ferritin: 181 ng/mL (ref 11–307)

## 2020-12-16 LAB — D-DIMER, QUANTITATIVE: D-Dimer, Quant: 0.56 ug/mL-FEU — ABNORMAL HIGH (ref 0.00–0.50)

## 2020-12-16 LAB — MAGNESIUM: Magnesium: 2 mg/dL (ref 1.7–2.4)

## 2020-12-16 LAB — PHOSPHORUS: Phosphorus: 4 mg/dL (ref 2.5–4.6)

## 2020-12-16 NOTE — Progress Notes (Signed)
PROGRESS NOTE    Whitney Sloan  HDQ:[id number] DOB: [birth year] DOA: 12/13/2020 PCP: Zachery Dauer, FNP    Chief Complaint  Patient presents with   Shortness of Breath    Brief Narrative:  Whitney Sloan is a 65 y.o. female with medical history significant for hypertension, insulin-dependent diabetes mellitus, BMI 40, and chronic diastolic CHF, now presenting to the emergency department for evaluation of shortness of breath, swelling, and weight gain.  Found to be in heart failure Also tested positive for COVID 19  Subjective: 1.9 liter urine output last 24hrs, Cr slightly went up She remained on oxygen supplement, but does not appear to have respiratory distress, she is talking on the phone for prolonged time, I entered her room twice while she was on the phone No dyspnea while talking on the phone, no cough noticed Worked with PT, SpO2 89-97% on RA during gait,  Assessment & Plan:   Principal Problem:   Acute on chronic diastolic (congestive) heart failure (HCC) Active Problems:   Uncontrolled type 2 diabetes mellitus with hyperglycemia, with long-term current use of insulin (HCC)   Hypertensive urgency   COVID-19 virus infection   Acute on chronic diastolic CHF, acute hypoxic respiratory failure Improving on diuresis, monitor renal function, monitor blood pressure Wean oxygen  COVID-19 infection She is on vaccinated Date denies URI symptom, no cough CRP is not elevated She received IV remdesivir and steroid and the fact that she has oxygen requirement Wean oxygen as tolerated  Insulin-dependent type 2 diabetes, uncontrolled -A1c 13% in 10/2020 -Currently on Lantus 45 units every morning, meal coverage 3 units with meal, and sliding scale insulin May need to continue increased insulin while on steroid  Hypertension urgency Blood pressure 191/104 with EMS Blood pressure has improved Continue current regimen   Class III obesity: Body mass index is 42.63  kg/m.Marland Kitchen     Unresulted Labs (From admission, onward)     Start     Ordered   12/20/20 0500  Creatinine, serum  (enoxaparin (LOVENOX)    CrCl >/= 30 ml/min)  Weekly,   R     Comments: while on enoxaparin therapy    12/13/20 1948   12/14/20 0500  CBC with Differential/Platelet  Daily,   R      12/13/20 2100   12/14/20 0500  Comprehensive metabolic panel  Daily,   R      12/13/20 2100   12/14/20 0500  C-reactive protein  Daily,   R      12/13/20 2100   12/14/20 0500  D-dimer, quantitative  Daily,   R      12/13/20 2100   12/14/20 0500  Ferritin  Daily,   R      12/13/20 2100   12/14/20 0500  Magnesium  Daily,   R      12/13/20 2100   12/14/20 0500  Phosphorus  Daily,   R      12/13/20 2100              DVT prophylaxis:   Lovenox   Code Status: Full Family Communication: Patient Disposition:   Status is: Inpatient  Dispo: The patient is from: Home              Anticipated d/c is to: Home              Anticipated d/c date is: Currently not ready to discharge, continue on IV Lasix, IV remdesivir, need to wean oxygen  Consultants:  None  Procedures:  None  Antimicrobials:   Remdesivir     Objective: Vitals:   12/16/20 0525 12/16/20 0800 12/16/20 0951 12/16/20 1100  BP: 138/69 (!) 165/106    Pulse: 60 70 62   Resp: 18 18 18    Temp: (!) 97.5 F (36.4 C)  98.1 F (36.7 C) 97.7 F (36.5 C)  TempSrc: Oral   Oral  SpO2: 96% 95% 96%   Weight:      Height:        Intake/Output Summary (Last 24 hours) at 12/16/2020 1537 Last data filed at 12/16/2020 1204 Gross per 24 hour  Intake 700 ml  Output 1750 ml  Net -1050 ml   Filed Weights   12/13/20 1753 12/14/20 0542 12/15/20 0500  Weight: 109.8 kg 115.6 kg 116.2 kg    Examination:  General exam: calm, NAD Respiratory system:  Respiratory effort normal. Cardiovascular system: RRR. Marland Kitchen Gastrointestinal system: Abdomen is nondistended,  Central nervous system: Alert and oriented. No focal  neurological deficits. Extremities: Needs walker assistant to ambulate Skin: No rashes, lesions or ulcers Psychiatry: Judgement and insight appear normal. Mood & affect appropriate.     Data Reviewed: I have personally reviewed following labs and imaging studies  CBC: Recent Labs  Lab 12/13/20 1754 12/13/20 1802 12/13/20 1826 12/14/20 0328 12/15/20 0329 12/16/20 0354  WBC 8.2  --   --  7.3 12.8* 14.7*  NEUTROABS 6.3  --   --  6.0 10.0* 12.0*  HGB 12.7 13.3 14.3 12.2 12.2 11.9*  HCT 41.5 39.0 42.0 39.1 37.9 37.6  MCV 88.3  --   --  89.1 86.3 86.2  PLT 264  --   --  255 245 243    Basic Metabolic Panel: Recent Labs  Lab 12/13/20 1754 12/13/20 1802 12/13/20 1826 12/14/20 0328 12/15/20 0329 12/16/20 0354  NA 140 140 141 139 138 136  K 3.8 3.5 3.8 4.1 3.8 3.3*  CL 102  --  100 104 101 95*  CO2 30  --   --  26 30 30   GLUCOSE 302*  --  303* 378* 357* 379*  BUN 9  --  10 14 26* 33*  CREATININE 0.81  --  0.60 0.92 0.94 1.11*  CALCIUM 8.8*  --   --  8.4* 8.3* 8.5*  MG  --   --   --  1.8 2.0 2.0  PHOS  --   --   --  4.5 4.0 4.0    GFR: Estimated Creatinine Clearance: 64.4 mL/min (A) (by C-G formula based on SCr of 1.11 mg/dL (H)).  Liver Function Tests: Recent Labs  Lab 12/13/20 1754 12/14/20 0328 12/15/20 0329 12/16/20 0354  AST 18 17 18 19   ALT 15 13 13 16   ALKPHOS 76 67 60 61  BILITOT 0.8 0.9 0.8 0.4  PROT 6.9 6.6 6.3* 6.2*  ALBUMIN 2.9* 2.6* 2.5* 2.5*    CBG: Recent Labs  Lab 12/15/20 1111 12/15/20 1613 12/15/20 2127 12/16/20 0637 12/16/20 1153  GLUCAP 309* 332* 360* 353* 394*     Recent Results (from the past 240 hour(s))  Culture, blood (routine x 2)     Status: None (Preliminary result)   Collection Time: 12/13/20  6:20 PM   Specimen: BLOOD RIGHT FOREARM  Result Value Ref Range Status   Specimen Description BLOOD RIGHT FOREARM  Final   Special Requests   Final    BOTTLES DRAWN AEROBIC AND ANAEROBIC Blood Culture results may not be  optimal due  to an inadequate volume of blood received in culture bottles   Culture   Final    NO GROWTH 3 DAYS Performed at [facility], [address] [place], [place] [address]    Report Status PENDING  Incomplete  Culture, blood (routine x 2)     Status: None (Preliminary result)   Collection Time: 12/13/20  6:23 PM   Specimen: BLOOD  Result Value Ref Range Status   Specimen Description BLOOD RIGHT ANTECUBITAL  Final   Special Requests   Final    BOTTLES DRAWN AEROBIC AND ANAEROBIC Blood Culture adequate volume   Culture   Final    NO GROWTH 3 DAYS Performed at [facility], [address], [place], [place] [address]    Report Status PENDING  Incomplete  Resp Panel by RT-PCR (Flu A&B, Covid) Nasopharyngeal Swab     Status: Abnormal   Collection Time: 12/13/20  7:20 PM   Specimen: Nasopharyngeal Swab; Nasopharyngeal(NP) swabs in vial transport medium  Result Value Ref Range Status   SARS Coronavirus 2 by RT PCR POSITIVE (A) NEGATIVE Final    Comment: RESULT CALLED TO, READ BACK BY AND VERIFIED WITH: RN CANDICE NUCKLES BY MESSAN H. AT 2052 ON 12/13/2020 (NOTE) SARS-CoV-2 target nucleic acids are DETECTED.  The SARS-CoV-2 RNA is generally detectable in upper respiratory specimens during the acute phase of infection. Positive results are indicative of the presence of the identified virus, but do not rule out bacterial infection or co-infection with other pathogens not detected by the test. Clinical correlation with patient history and other diagnostic information is necessary to determine patient infection status. The expected result is Negative.  Fact Sheet for Patients: BloggerCourse.com  Fact Sheet for Healthcare Providers: SeriousBroker.it  This test is not yet approved or cleared by the [place] FDA and  has been authorized for detection and/or diagnosis of SARS-CoV-2 by FDA under an Emergency Use  Authorization (EUA).  This EUA will remain in effect (meanin g this test can be used) for the duration of  the COVID-19 declaration under Section 564(b)(1) of the Act, 21 U.S.C. section 360bbb-3(b)(1), unless the authorization is terminated or revoked sooner.     Influenza A by PCR NEGATIVE NEGATIVE Final   Influenza B by PCR NEGATIVE NEGATIVE Final    Comment: (NOTE) The Xpert Xpress SARS-CoV-2/FLU/RSV plus assay is intended as an aid in the diagnosis of influenza from Nasopharyngeal swab specimens and should not be used as a sole basis for treatment. Nasal washings and aspirates are unacceptable for Xpert Xpress SARS-CoV-2/FLU/RSV testing.  Fact Sheet for Patients: BloggerCourse.com  Fact Sheet for Healthcare Providers: SeriousBroker.it  This test is not yet approved or cleared by the [place] FDA and has been authorized for detection and/or diagnosis of SARS-CoV-2 by FDA under an Emergency Use Authorization (EUA). This EUA will remain in effect (meaning this test can be used) for the duration of the COVID-19 declaration under Section 564(b)(1) of the Act, 21 U.S.C. section 360bbb-3(b)(1), unless the authorization is terminated or revoked.  Performed at [facility], [address], [place], [place] [address]          Radiology Studies: No results found.      Scheduled Meds:  aspirin  81 mg Oral BH-q7a   atorvastatin  80 mg Oral Daily   diltiazem  240 mg Oral Daily   enoxaparin (LOVENOX) injection  55 mg Subcutaneous Q24H   furosemide  60 mg Intravenous Q8H  insulin aspart  0-15 Units Subcutaneous TID WC   insulin aspart  0-5 Units Subcutaneous QHS   insulin aspart  6 Units Subcutaneous TID WC   insulin glargine  45 Units Subcutaneous Daily   losartan  100 mg Oral Daily   metoprolol succinate  50 mg Oral Daily   pantoprazole  40 mg Oral Daily   potassium chloride SA  20 mEq Oral Daily   [START  ON 12/17/2020] predniSONE  50 mg Oral Daily   tamsulosin  0.4 mg Oral Daily   Continuous Infusions:  sodium chloride 250 mL (12/16/20 0942)   remdesivir 100 mg in NS 100 mL 100 mg (12/16/20 0944)     LOS: 3 days   Time spent: Greater than 50% of this time was spent in counseling, explanation of diagnosis, planning of further management, and coordination of care.   Voice Recognition Reubin Milan dictation system was used to create this note, attempts have been made to correct errors. Please contact the author with questions and/or clarifications.   Albertine Grates, MD PhD FACP [facility]  Available via Epic secure chat 7am-7pm for nonurgent issues Please page for urgent issues To page the attending provider between 7A-7P or the covering provider during after hours 7P-7A, please log into the web site [email or web address] and access using universal [place] password for that web site. If you do not have the password, please call the hospital operator.    12/16/2020, 3:37 PM

## 2020-12-16 NOTE — Plan of Care (Signed)
  Problem: Education: Goal: Knowledge of General Education information will improve Description: Including pain rating scale, medication(s)/side effects and non-pharmacologic comfort measures Outcome: Progressing   Problem: Health Behavior/Discharge Planning: Goal: Ability to manage health-related needs will improve Outcome: Progressing   Problem: Clinical Measurements: Goal: Ability to maintain clinical measurements within normal limits will improve Outcome: Progressing Goal: Will remain free from infection Outcome: Progressing Goal: Diagnostic test results will improve Outcome: Progressing Goal: Respiratory complications will improve Outcome: Progressing Goal: Cardiovascular complication will be avoided Outcome: Progressing   Problem: Activity: Goal: Risk for activity intolerance will decrease Outcome: Progressing   Problem: Nutrition: Goal: Adequate nutrition will be maintained Outcome: Progressing   Problem: Coping: Goal: Level of anxiety will decrease Outcome: Progressing   Problem: Elimination: Goal: Will not experience complications related to bowel motility Outcome: Progressing Goal: Will not experience complications related to urinary retention Outcome: Progressing   Problem: Pain Managment: Goal: General experience of comfort will improve Outcome: Progressing   Problem: Safety: Goal: Ability to remain free from injury will improve Outcome: Progressing   Problem: Skin Integrity: Goal: Risk for impaired skin integrity will decrease Outcome: Progressing   Problem: Education: Goal: Ability to demonstrate management of disease process will improve Outcome: Progressing Goal: Ability to verbalize understanding of medication therapies will improve Outcome: Progressing Goal: Individualized Educational Video(s) Outcome: Progressing   Problem: Activity: Goal: Capacity to carry out activities will improve Outcome: Progressing   Problem: Cardiac: Goal:  Ability to achieve and maintain adequate cardiopulmonary perfusion will improve Outcome: Progressing   Problem: Education: Goal: Knowledge of risk factors and measures for prevention of condition will improve Outcome: Progressing   Problem: Coping: Goal: Psychosocial and spiritual needs will be supported Outcome: Progressing   Problem: Respiratory: Goal: Will maintain a patent airway Outcome: Progressing Goal: Complications related to the disease process, condition or treatment will be avoided or minimized Outcome: Progressing   

## 2020-12-16 NOTE — Progress Notes (Addendum)
Heart Failure Stewardship Pharmacist Progress Note   PCP: Zachery Dauer, FNP PCP-Cardiologist: Little Ishikawa, MD   HPI:  76 YOF with a PMH of HTN, T2DM, BLE, and CHF presenting to the ED on 12/13/20 with c/o SOB x 2 days. On admission, patient's BP was 191/104 with a HR of 108. Patient had O2 in the 70s and she was placed on CPAP then switched to BiPAP. O2 levels had improved into the upper 90s.  She also was given 80 mg of IV Lasix on admission, which she tolerated well. Patients troponin and BNP levels were also elevated. Her last ECHO in 10/2019 showed LVEF 50-55% with G2DD, trivial MV regurgitation and mild aortic valve sclerosis. Stress test performed in 12/2019 showed a further reduced LVEF of 39%. CXR on admission was positive for pulomary edema in ED. Patient was also found to be COVID+.   Current HF Medications: Furosemide IV 60 mg q8h Metoprolol XL 50 mg daily Losartan 100 mg daily  Prior to admission HF Medications: Furosemide 40 mg daily Metoprolol succinate 50 mg daily Losartan 50 mg daily  Pertinent Lab Values: Serum creatinine 1.11, BUN 33, Potassium 3.3, Sodium 136, BNP 516, Magnesium 2.0  Vital Signs: Weight: 256 lbs (admission weight: 254 lbs) Blood pressure: 160/80s Heart rate: 60s  Medication Assistance / Insurance Benefits Check: Does the patient have prescription insurance?  Yes Type of insurance plan: Medicaid  Outpatient Pharmacy:  Prior to admission outpatient pharmacy: [facility]) Is the patient willing to use [facility] TOC pharmacy at discharge? Yes Is the patient willing to transition their outpatient pharmacy to utilize a [facility] outpatient pharmacy?   Pending   Assessment: 1. Acute on chronic systolic CHF (EF 38%), due to possible ICM (has not completed cath or coronary CTA to further evaluate ischemia following positive stress test in 2021) dietary indiscretion, and obesity. NYHA class III symptoms. - Continue furosemide 60 mg IV  q8h - Continue Losartan 100 mg daily. May consider switching to [facility] 24/26 for additional heart failure benefit and diuretic effects. - Continue metoprolol succinate 50 mg daily - Consider adding spironolactone 25 mg daily for additional HF optimization. Can also help reduce BP further and replete low potassium - SGLT2i not recommended for this patient at this time due to A1c 13 % (last checked in 10/2020) and CBGs consistently in 300s.    Plan: 1) Medication changes recommended at this time: - Start spironolactone 25 mg daily  2) Patient assistance: - Patient has Medicaid so all medications should be $0-$3 per medication - Sherryll Burger does require prior authorization, our team can complete this if initiated prior to discharge  3)  Education  - To be completed prior to discharge  Asencion Gowda PharmD/MBA Candidate [employer] Class of 2023

## 2020-12-16 NOTE — Progress Notes (Signed)
Physical Therapy Treatment Patient Details Name: Whitney Sloan MRN: [id number] DOB: August 17, 1955 Today's Date: 12/16/2020    History of Present Illness 65yo female admitted 12/13/20 c/o BLE edema, weight gain, severe SOB. Found to be in acute on chronic CHF, with acute hypoxic respiratory failure, and Covid positive. PMH CHF, DM, HLD, HTN    PT Comments    Pt motivated to participate in therapy, states she feels her breathing is improving "now that the fluid is off of me". Pt ambulatory in room with use of RW, overall supervision for safety during mobility. SpO2 89-97% on RA during gait, RN notified. Will continue to follow.     Follow Up Recommendations  Home health PT     Equipment Recommendations  Rolling walker with 5" wheels;3in1 (PT)    Recommendations for Other Services       Precautions / Restrictions Precautions Precautions: Fall;Other (comment) Precaution Comments: watch sats Restrictions Weight Bearing Restrictions: No    Mobility  Bed Mobility Overal bed mobility: Needs Assistance             General bed mobility comments: up in chair    Transfers Overall transfer level: Needs assistance Equipment used: Rolling walker (2 wheeled) Transfers: Sit to/from Stand Sit to Stand: Supervision         General transfer comment: for safety, repeated STS for LE strengthening  Ambulation/Gait Ambulation/Gait assistance: Supervision Gait Distance (Feet): 75 Feet Assistive device: Rolling walker (2 wheeled) Gait Pattern/deviations: Step-through pattern;Decreased stride length Gait velocity: decr   General Gait Details: supervision for safety, occasional cuing for room navigation/RW use. SpO2 89-97% on RA during gait, RN notified   Stairs             Wheelchair Mobility    Modified Rankin (Stroke Patients Only)       Balance Overall balance assessment: Mild deficits observed, not formally tested                                           Cognition Arousal/Alertness: Awake/alert Behavior During Therapy: WFL for tasks assessed/performed Overall Cognitive Status: Within Functional Limits for tasks assessed                                        Exercises General Exercises - Lower Extremity Long Arc Quad: AROM;Both;10 reps;Seated Mini-Sqauts: AROM;Both;10 reps;Seated;Standing (sit to stands from recliner, use of UEs on RW to pull up even when cued to push up from chair)    General Comments General comments (skin integrity, edema, etc.): VSS on 2L 02      Pertinent Vitals/Pain Pain Assessment: No/denies pain    Home Living Family/patient expects to be discharged to:: Private residence Living Arrangements: Children Available Help at Discharge: Family;Available PRN/intermittently Type of Home: Apartment Home Access: Level entry   Home Layout: One level Home Equipment: Walker - 4 wheels;Shower seat;Cane - quad Additional Comments: has had some falls- oe was when she was coming out of a restaurant and cane slid, other fall she was walking down a hill and fell    Prior Function Level of Independence: Independent with assistive device(s)      Comments: independent with ADLs, IADLs   PT Goals (current goals can now be found in the care plan section) Acute Rehab PT Goals Patient  Stated Goal: go home when medically ready PT Goal Formulation: With patient Time For Goal Achievement: 12/29/20 Potential to Achieve Goals: Good Progress towards PT goals: Progressing toward goals    Frequency    Min 3X/week      PT Plan Current plan remains appropriate    Co-evaluation              AM-PAC PT "6 Clicks" Mobility   Outcome Measure  Help needed turning from your back to your side while in a flat bed without using bedrails?: None Help needed moving from lying on your back to sitting on the side of a flat bed without using bedrails?: None Help needed moving to and from a bed to a chair  (including a wheelchair)?: A Little Help needed standing up from a chair using your arms (e.g., wheelchair or bedside chair)?: A Little Help needed to walk in hospital room?: A Little Help needed climbing 3-5 steps with a railing? : A Little 6 Click Score: 20    End of Session   Activity Tolerance: Patient tolerated treatment well Patient left: with call bell/phone within reach;in chair;with chair alarm set (sitting at EOB per her request) Nurse Communication: Mobility status PT Visit Diagnosis: Muscle weakness (generalized) (M62.81);History of falling (Z91.81);Unsteadiness on feet (R26.81)     Time: 1510-1531 PT Time Calculation (min) (ACUTE ONLY): 21 min  Charges:  $Gait Training: 8-22 mins                    Marye Round, PT DPT Acute Rehabilitation Services Pager [phone]  Office [phone]    [name] 12/16/2020, 4:29 PM

## 2020-12-16 NOTE — Progress Notes (Signed)
Occupational Therapy Evaluation Patient Details Name: Whitney Sloan MRN: [id number] DOB: 04/28/1956 Today's Date: 12/16/2020    History of Present Illness 65yo female admitted 12/13/20 c/o BLE edema, weight gain, severe SOB. Found to be in acute on chronic CHF, with acute hypoxic respiratory failure, and Covid positive. PMH CHF, DM, HLD, HTN   Clinical Impression   PTA patient independent with ADLs, IADLs. Admitted for above and presenting with problem list below, including decreased activity tolerance, generalized weakness.  Pt on 2L O2 during session with SPO2 >92%, VSS.  Completing transfers and mobility with min guard using RW in room, cueing required for hand placement and safety.  Completing ADLs with up to min assist.  Will benefit from continued OT services acutely to optimize independence and safety, but anticipate no further needs after dc home.     Follow Up Recommendations  No OT follow up;Supervision - Intermittent    Equipment Recommendations  None recommended by OT    Recommendations for Other Services       Precautions / Restrictions Precautions Precautions: Fall;Other (comment) Precaution Comments: watch sats Restrictions Weight Bearing Restrictions: No      Mobility Bed Mobility Overal bed mobility: Modified Independent             General bed mobility comments: HOB elevated, use of rail    Transfers                      Balance Overall balance assessment: Mild deficits observed, not formally tested                                         ADL either performed or assessed with clinical judgement   ADL Overall ADL's : Needs assistance/impaired     Grooming: Supervision/safety;Standing           Upper Body Dressing : Set up;Sitting   Lower Body Dressing: Min guard;Sit to/from stand Lower Body Dressing Details (indicate cue type and reason): able to don socks at EOB, min guard sit to stand Toilet Transfer: Min  guard;Ambulation;RW           Functional mobility during ADLs: Min guard;Rolling walker General ADL Comments: mobility and transfers in room with min guard using RW, cueing for hand placement and safety     Vision         Perception     Praxis      Pertinent Vitals/Pain Pain Assessment: No/denies pain     Hand Dominance Right   Extremity/Trunk Assessment Upper Extremity Assessment Upper Extremity Assessment: Overall WFL for tasks assessed   Lower Extremity Assessment Lower Extremity Assessment: Defer to PT evaluation       Communication Communication Communication: No difficulties   Cognition Arousal/Alertness: Awake/alert Behavior During Therapy: WFL for tasks assessed/performed Overall Cognitive Status: Within Functional Limits for tasks assessed                                     General Comments  VSS on 2L 02    Exercises     Shoulder Instructions      Home Living Family/patient expects to be discharged to:: Private residence Living Arrangements: Children Available Help at Discharge: Family;Available PRN/intermittently Type of Home: Apartment Home Access: Level entry     Home Layout: One level  Bathroom Shower/Tub: Chief Strategy Officer: Handicapped height     Home Equipment: Environmental consultant - 4 wheels;Shower seat;Cane - quad   Additional Comments: has had some falls- oe was when she was coming out of a restaurant and cane slid, other fall she was walking down a hill and fell      Prior Functioning/Environment Level of Independence: Independent with assistive device(s)        Comments: independent with ADLs, IADLs        OT Problem List: Decreased strength;Decreased activity tolerance;Impaired balance (sitting and/or standing);Decreased safety awareness;Decreased knowledge of use of DME or AE;Decreased knowledge of precautions      OT Treatment/Interventions: Self-care/ADL training;DME and/or AE  instruction;Therapeutic activities;Patient/family education;Balance training;Therapeutic exercise    OT Goals(Current goals can be found in the care plan section) Acute Rehab OT Goals Patient Stated Goal: go home when medically ready OT Goal Formulation: With patient Time For Goal Achievement: 12/30/20 Potential to Achieve Goals: Good  OT Frequency: Min 2X/week   Barriers to D/C:            Co-evaluation              AM-PAC OT "6 Clicks" Daily Activity     Outcome Measure Help from another person eating meals?: None Help from another person taking care of personal grooming?: A Little Help from another person toileting, which includes using toliet, bedpan, or urinal?: A Little Help from another person bathing (including washing, rinsing, drying)?: A Little Help from another person to put on and taking off regular upper body clothing?: A Little Help from another person to put on and taking off regular lower body clothing?: A Little 6 Click Score: 19   End of Session Equipment Utilized During Treatment: Rolling walker;Oxygen Nurse Communication: Mobility status  Activity Tolerance: Patient tolerated treatment well Patient left: in chair;with call bell/phone within reach;with chair alarm set;with nursing/sitter in room  OT Visit Diagnosis: Other abnormalities of gait and mobility (R26.89);Muscle weakness (generalized) (M62.81)                Time: 4098-1191 OT Time Calculation (min): 23 min Charges:  OT General Charges $OT Visit: 1 Visit OT Evaluation $OT Eval Low Complexity: 1 Low OT Treatments $Self Care/Home Management : 8-22 mins  Barry Brunner, OT Acute Rehabilitation Services Pager [phone] Office [phone]   [name] 12/16/2020, 1:43 PM

## 2020-12-17 ENCOUNTER — Inpatient Hospital Stay (HOSPITAL_COMMUNITY): Payer: Medicare Other

## 2020-12-17 DIAGNOSIS — I5033 Acute on chronic diastolic (congestive) heart failure: Secondary | ICD-10-CM | POA: Diagnosis not present

## 2020-12-17 DIAGNOSIS — I517 Cardiomegaly: Secondary | ICD-10-CM | POA: Diagnosis not present

## 2020-12-17 DIAGNOSIS — J811 Chronic pulmonary edema: Secondary | ICD-10-CM | POA: Diagnosis not present

## 2020-12-17 DIAGNOSIS — R0602 Shortness of breath: Secondary | ICD-10-CM | POA: Diagnosis not present

## 2020-12-17 LAB — GLUCOSE, CAPILLARY
Glucose-Capillary: 251 mg/dL — ABNORMAL HIGH (ref 70–99)
Glucose-Capillary: 233 mg/dL — ABNORMAL HIGH (ref 70–99)
Glucose-Capillary: 286 mg/dL — ABNORMAL HIGH (ref 70–99)
Glucose-Capillary: 322 mg/dL — ABNORMAL HIGH (ref 70–99)

## 2020-12-17 LAB — COMPREHENSIVE METABOLIC PANEL
ALT: 15 U/L (ref 0–44)
AST: 18 U/L (ref 15–41)
Albumin: 2.5 g/dL — ABNORMAL LOW (ref 3.5–5.0)
Alkaline Phosphatase: 59 U/L (ref 38–126)
Anion gap: 7 (ref 5–15)
BUN: 34 mg/dL — ABNORMAL HIGH (ref 8–23)
CO2: 34 mmol/L — ABNORMAL HIGH (ref 22–32)
Calcium: 8.3 mg/dL — ABNORMAL LOW (ref 8.9–10.3)
Chloride: 96 mmol/L — ABNORMAL LOW (ref 98–111)
Creatinine, Ser: 1.12 mg/dL — ABNORMAL HIGH (ref 0.44–1.00)
GFR, Estimated: 55 mL/min — ABNORMAL LOW (ref >60)
Glucose, Bld: 279 mg/dL — ABNORMAL HIGH (ref 70–99)
Potassium: 3 mmol/L — ABNORMAL LOW (ref 3.5–5.1)
Sodium: 137 mmol/L (ref 135–145)
Total Bilirubin: 0.9 mg/dL (ref 0.3–1.2)
Total Protein: 6.3 g/dL — ABNORMAL LOW (ref 6.5–8.1)

## 2020-12-17 LAB — CBC WITH DIFFERENTIAL/PLATELET
Abs Immature Granulocytes: 0.17 10*3/uL — ABNORMAL HIGH (ref 0.00–0.07)
Basophils Absolute: 0 10*3/uL (ref 0.0–0.1)
Basophils Relative: 0 %
Eosinophils Absolute: 0 10*3/uL (ref 0.0–0.5)
Eosinophils Relative: 0 %
HCT: 41.1 % (ref 36.0–46.0)
Hemoglobin: 13.2 g/dL (ref 12.0–15.0)
Immature Granulocytes: 1 %
Lymphocytes Relative: 15 %
Lymphs Abs: 2.2 10*3/uL (ref 0.7–4.0)
MCH: 27.3 pg (ref 26.0–34.0)
MCHC: 32.1 g/dL (ref 30.0–36.0)
MCV: 84.9 fL (ref 80.0–100.0)
Monocytes Absolute: 1.2 10*3/uL — ABNORMAL HIGH (ref 0.1–1.0)
Monocytes Relative: 9 %
Neutro Abs: 10.7 10*3/uL — ABNORMAL HIGH (ref 1.7–7.7)
Neutrophils Relative %: 75 %
Platelets: 262 10*3/uL (ref 150–400)
RBC: 4.84 MIL/uL (ref 3.87–5.11)
RDW: 15.6 % — ABNORMAL HIGH (ref 11.5–15.5)
WBC: 14.3 10*3/uL — ABNORMAL HIGH (ref 4.0–10.5)
nRBC: 0 % (ref 0.0–0.2)

## 2020-12-17 LAB — C-REACTIVE PROTEIN: CRP: 0.6 mg/dL (ref <1.0)

## 2020-12-17 LAB — FERRITIN: Ferritin: 224 ng/mL (ref 11–307)

## 2020-12-17 LAB — D-DIMER, QUANTITATIVE: D-Dimer, Quant: 0.53 ug/mL-FEU — ABNORMAL HIGH (ref 0.00–0.50)

## 2020-12-17 LAB — MAGNESIUM: Magnesium: 2 mg/dL (ref 1.7–2.4)

## 2020-12-17 LAB — PHOSPHORUS: Phosphorus: 3.4 mg/dL (ref 2.5–4.6)

## 2020-12-17 MED ORDER — POTASSIUM CHLORIDE CRYS ER 20 MEQ PO TBCR
40.0000 meq | EXTENDED_RELEASE_TABLET | Freq: Once | ORAL | Status: AC
  Administered 2020-12-17: 40 meq via ORAL
  Filled 2020-12-17: qty 2

## 2020-12-17 MED ORDER — FUROSEMIDE 80 MG PO TABS
80.0000 mg | ORAL_TABLET | Freq: Two times a day (BID) | ORAL | Status: DC
  Administered 2020-12-17 – 2020-12-18 (×2): 80 mg via ORAL
  Filled 2020-12-17 (×2): qty 1

## 2020-12-17 NOTE — Progress Notes (Addendum)
PROGRESS NOTE    Whitney Sloan  AST:[id number] DOB: Apr 09, 1956 DOA: 12/13/2020 PCP: Zachery Dauer, FNP    Chief Complaint  Patient presents with   Shortness of Breath    Brief Narrative:  Whitney Sloan is a 65 y.o. female with medical history significant for hypertension, insulin-dependent diabetes mellitus, BMI 40, and chronic diastolic CHF, now presenting to the emergency department for evaluation of shortness of breath, swelling, and weight gain.  Found to be in heart failure Also tested positive for COVID 19  Subjective: 1.9 liter urine output last 24hrs, Cr slightly went up She is now on room air, reports feeling better, bilateral lower extremity edema has much improved She denies chest pain, no fever   Assessment & Plan:   Principal Problem:   Acute on chronic diastolic (congestive) heart failure (HCC) Active Problems:   Uncontrolled type 2 diabetes mellitus with hyperglycemia, with long-term current use of insulin (HCC)   Hypertensive urgency   COVID-19 virus infection   Acute on chronic diastolic CHF, acute hypoxic respiratory failure -Report takes Lasix 40 mg daily at home prior to coming to hospital, she was last seen by cardiology in October 2021, at the time she was instructed to take Lasix 80 mg twice a day, patient appeared to have some confusion about her home regimen, she stated she did not make up to the follow-up appointment due to lack of transportation at the time, transportation issue has since resolved per report -Improving on IV Lasix 80 mg 3 times a day , repeat chest x-ray improved pulmonary edema, now on room air at rest -Change IV Lasix to oral Lasix 80 mg twice a day -monitor renal function, monitor blood pressure -She states she missed her cardiology appointment while she is staying in the hospital, she is advised to call cardiology office to make an appointment next week, I will send a note to cardiology office as well  Hypokalemia Replace  K  COVID-19 infection She is not vaccinated Reports some URI symptom, no cough CRP is not elevated She received IV remdesivir and steroid due to hypoxia requiring oxygen supplement She is now off oxygen, on room air   Insulin-dependent type 2 diabetes, uncontrolled -A1c 13% in 10/2020 -Currently on Lantus 45 units every morning, meal coverage 3 units with meal, and sliding scale insulin -stay on current regimen as prednisone is being tappered She needs to follow up with her pcp for better diabetes control  Hypertension urgency Blood pressure 191/104 with EMS Blood pressure has improved Continue current regimen   Class III obesity: Body mass index is 41.3 kg/m.Marland Kitchen     Unresulted Labs (From admission, onward)     Start     Ordered   12/20/20 0500  Creatinine, serum  (enoxaparin (LOVENOX)    CrCl >/= 30 ml/min)  Weekly,   R     Comments: while on enoxaparin therapy    12/13/20 1948   12/18/20 0500  Basic metabolic panel  Tomorrow morning,   R        12/17/20 1246   12/14/20 0500  CBC with Differential/Platelet  Daily,   R      12/13/20 2100   12/14/20 0500  C-reactive protein  Daily,   R      12/13/20 2100   12/14/20 0500  D-dimer, quantitative  Daily,   R      12/13/20 2100   12/14/20 0500  Ferritin  Daily,   R  12/13/20 2100   12/14/20 0500  Magnesium  Daily,   R      12/13/20 2100   12/14/20 0500  Phosphorus  Daily,   R      12/13/20 2100              DVT prophylaxis:   Lovenox   Code Status: Full Family Communication: Patient Disposition:   Status is: Inpatient  Dispo: The patient is from: Home              Anticipated d/c is to: Home              Anticipated d/c date is: likely tomorrow                Consultants:  None  Procedures:  None  Antimicrobials:   Remdesivir     Objective: Vitals:   12/16/20 2350 12/17/20 0344 12/17/20 0803 12/17/20 1132  BP: (!) 166/81 (!) 159/82 (!) 147/98 124/83  Pulse: 63 65 66 64  Resp: 16 17 19  14   Temp: 97.6 F (36.4 C) 97.6 F (36.4 C) (!) 97.4 F (36.3 C) 97.9 F (36.6 C)  TempSrc: Oral Oral Oral Oral  SpO2: 100% 98% 97% 97%  Weight:  112.6 kg    Height:        Intake/Output Summary (Last 24 hours) at 12/17/2020 1248 Last data filed at 12/17/2020 1035 Gross per 24 hour  Intake 1089.23 ml  Output 1600 ml  Net -510.77 ml   Filed Weights   12/14/20 0542 12/15/20 0500 12/17/20 0344  Weight: 115.6 kg 116.2 kg 112.6 kg    Examination:  General exam: calm, NAD Respiratory system:  Respiratory effort normal. Cardiovascular system: RRR. 02/17/21 Gastrointestinal system: Abdomen is nondistended,  Central nervous system: Alert and oriented. No focal neurological deficits. Extremities: trace bilateral lower extremity pitting edema, much improved  Skin: No rashes, lesions or ulcers Psychiatry: Judgement and insight appear normal. Mood & affect appropriate.     Data Reviewed: I have personally reviewed following labs and imaging studies  CBC: Recent Labs  Lab 12/13/20 1754 12/13/20 1802 12/13/20 1826 12/14/20 0328 12/15/20 0329 12/16/20 0354 12/17/20 0318  WBC 8.2  --   --  7.3 12.8* 14.7* 14.3*  NEUTROABS 6.3  --   --  6.0 10.0* 12.0* 10.7*  HGB 12.7   < > 14.3 12.2 12.2 11.9* 13.2  HCT 41.5   < > 42.0 39.1 37.9 37.6 41.1  MCV 88.3  --   --  89.1 86.3 86.2 84.9  PLT 264  --   --  255 245 243 262   < > = values in this interval not displayed.    Basic Metabolic Panel: Recent Labs  Lab 12/13/20 1754 12/13/20 1802 12/13/20 1826 12/14/20 0328 12/15/20 0329 12/16/20 0354 12/17/20 0318  NA 140   < > 141 139 138 136 137  K 3.8   < > 3.8 4.1 3.8 3.3* 3.0*  CL 102  --  100 104 101 95* 96*  CO2 30  --   --  26 30 30  34*  GLUCOSE 302*  --  303* 378* 357* 379* 279*  BUN 9  --  10 14 26* 33* 34*  CREATININE 0.81  --  0.60 0.92 0.94 1.11* 1.12*  CALCIUM 8.8*  --   --  8.4* 8.3* 8.5* 8.3*  MG  --   --   --  1.8 2.0 2.0 2.0  PHOS  --   --   --  4.5 4.0 4.0 3.4   < >  = values in this interval not displayed.    GFR: Estimated Creatinine Clearance: 62.6 mL/min (A) (by C-G formula based on SCr of 1.12 mg/dL (H)).  Liver Function Tests: Recent Labs  Lab 12/13/20 1754 12/14/20 0328 12/15/20 0329 12/16/20 0354 12/17/20 0318  AST 18 17 18 19 18   ALT 15 13 13 16 15   ALKPHOS 76 67 60 61 59  BILITOT 0.8 0.9 0.8 0.4 0.9  PROT 6.9 6.6 6.3* 6.2* 6.3*  ALBUMIN 2.9* 2.6* 2.5* 2.5* 2.5*    CBG: Recent Labs  Lab 12/16/20 1153 12/16/20 1629 12/16/20 2043 12/17/20 0621 12/17/20 1130  GLUCAP 394* 352* 376* 251* 233*     Recent Results (from the past 240 hour(s))  Culture, blood (routine x 2)     Status: None (Preliminary result)   Collection Time: 12/13/20  6:20 PM   Specimen: BLOOD RIGHT FOREARM  Result Value Ref Range Status   Specimen Description BLOOD RIGHT FOREARM  Final   Special Requests   Final    BOTTLES DRAWN AEROBIC AND ANAEROBIC Blood Culture results may not be optimal due to an inadequate volume of blood received in culture bottles   Culture   Final    NO GROWTH 4 DAYS Performed at [facility], [address] [place], [address] [place]    Report Status PENDING  Incomplete  Culture, blood (routine x 2)     Status: None (Preliminary result)   Collection Time: 12/13/20  6:23 PM   Specimen: BLOOD  Result Value Ref Range Status   Specimen Description BLOOD RIGHT ANTECUBITAL  Final   Special Requests   Final    BOTTLES DRAWN AEROBIC AND ANAEROBIC Blood Culture adequate volume   Culture   Final    NO GROWTH 4 DAYS Performed at [facility], [address], [place], [address] [place]    Report Status PENDING  Incomplete  Resp Panel by RT-PCR (Flu A&B, Covid) Nasopharyngeal Swab     Status: Abnormal   Collection Time: 12/13/20  7:20 PM   Specimen: Nasopharyngeal Swab; Nasopharyngeal(NP) swabs in vial transport medium  Result Value Ref Range Status   SARS Coronavirus 2 by RT PCR POSITIVE (A) NEGATIVE Final    Comment:  RESULT CALLED TO, READ BACK BY AND VERIFIED WITH: RN CANDICE NUCKLES BY MESSAN H. AT 2052 ON 12/13/2020 (NOTE) SARS-CoV-2 target nucleic acids are DETECTED.  The SARS-CoV-2 RNA is generally detectable in upper respiratory specimens during the acute phase of infection. Positive results are indicative of the presence of the identified virus, but do not rule out bacterial infection or co-infection with other pathogens not detected by the test. Clinical correlation with patient history and other diagnostic information is necessary to determine patient infection status. The expected result is Negative.  Fact Sheet for Patients: 2053  Fact Sheet for Healthcare Providers: 02/13/2021  This test is not yet approved or cleared by the [employer] FDA and  has been authorized for detection and/or diagnosis of SARS-CoV-2 by FDA under an Emergency Use Authorization (EUA).  This EUA will remain in effect (meanin g this test can be used) for the duration of  the COVID-19 declaration under Section 564(b)(1) of the Act, 21 U.S.C. section 360bbb-3(b)(1), unless the authorization is terminated or revoked sooner.     Influenza A by PCR NEGATIVE NEGATIVE Final   Influenza B by PCR NEGATIVE NEGATIVE Final    Comment: (NOTE) The Xpert Xpress  SARS-CoV-2/FLU/RSV plus assay is intended as an aid in the diagnosis of influenza from Nasopharyngeal swab specimens and should not be used as a sole basis for treatment. Nasal washings and aspirates are unacceptable for Xpert Xpress SARS-CoV-2/FLU/RSV testing.  Fact Sheet for Patients: [employer]com  Fact Sheet for Healthcare Providers: Serious[employer]ker.it  This test is not yet approved or cleared by the [place] FDA and has been authorized for detection and/or diagnosis of SARS-CoV-2 by FDA under an Emergency Use Authorization (EUA).  This EUA will remain in effect (meaning this test can be used) for the duration of the COVID-19 declaration under Section 564(b)(1) of the Act, 21 U.S.C. section 360bbb-3(b)(1), unless the authorization is terminated or revoked.  Performed at [facility], [address], [place], [place] [address]          Radiology Studies: No results found.      Scheduled Meds:  aspirin  81 mg Oral BH-q7a   atorvastatin  80 mg Oral Daily   diltiazem  240 mg Oral Daily   enoxaparin (LOVENOX) injection  55 mg Subcutaneous Q24H   furosemide  60 mg Intravenous Q8H   insulin aspart  0-15 Units Subcutaneous TID WC   insulin aspart  0-5 Units Subcutaneous QHS   insulin aspart  6 Units Subcutaneous TID WC   insulin glargine  45 Units Subcutaneous Daily   losartan  100 mg Oral Daily   metoprolol succinate  50 mg Oral Daily   pantoprazole  40 mg Oral Daily   potassium chloride SA  20 mEq Oral Daily   potassium chloride  40 mEq Oral Once   predniSONE  50 mg Oral Daily   tamsulosin  0.4 mg Oral Daily   Continuous Infusions:  sodium chloride 250 mL (12/16/20 0942)     LOS: 4 days   Time spent: Greater than 50% of this time was spent in counseling, explanation of diagnosis, planning of further management, and coordination of care.   Voice Recognition Reubin [place] dictation system was used to create this note, attempts have been made to correct errors. Please contact the author with questions and/or clarifications.   Albertine Grates, MD PhD FACP [facility]  Available via Epic secure chat 7am-7pm for nonurgent issues Please page for urgent issues To page the attending provider between 7A-7P or the covering provider during after hours 7P-7A, please log into the web site [email or web address] and access using universal [place] password for that web site. If you do not have the password, please call the hospital operator.    12/17/2020, 12:48 PM

## 2020-12-17 NOTE — Progress Notes (Signed)
Pt cant wear CPAP due to pt covid positive and not in negative pressure room

## 2020-12-17 NOTE — TOC Transition Note (Signed)
Transition of Care [facility]) - CM/SW Discharge Note   Patient Details  Name: JUNI GLAAB MRN: [id number] Date of Birth: 11-21-1955  [facility] of [facility]) CM/SW Contact:  Leone Haven, RN Phone Number: 12/17/2020, 4:12 PM   Clinical Narrative:    NCM spoke with patient, she lives with her daughter who works. Patient has a cane and a walker at home and a tub shower seat.  She has a scale and bp cuff, she does not eat a low sodium diet but states she will try to do better.  NCM asked if she wanted some nutrition information she states she has a lot of the information at home she just did not use it. NCM asked if she would like to be set up with HHPT, she states she would like outpatient pt on [facility]   NCM will make referral thru epic for her. She will need to call and set up apt times. She states she uses [facility] for her MD apts.   Final next level of care: Home/Self Care Barriers to Discharge: Continued Medical Work up   Patient Goals and CMS Choice Patient states their goals for this hospitalization and ongoing recovery are:: return home   Choice offered to / list presented to : NA  Discharge Placement                       Discharge Plan and Services                  DME Agency: NA       HH Arranged: NA          Social Determinants of Health (SDOH) Interventions     Readmission Risk Interventions No flowsheet data found.

## 2020-12-17 NOTE — Progress Notes (Addendum)
Inpatient Diabetes Program Recommendations  AACE/ADA: New Consensus Statement on Inpatient Glycemic Control (2015)  Target Ranges:  Prepandial:   less than 140 mg/dL      Peak postprandial:   less than 180 mg/dL (1-2 hours)      Critically ill patients:  140 - 180 mg/dL   Lab Results  Component Value Date   GLUCAP 251 (H) 12/17/2020   HGBA1C 13.0 (A) 10/14/2020    Review of Glycemic Control Results for Whitney Sloan, Whitney Sloan (MRN [id number]) as of 12/17/2020 08:26  Ref. Range 12/16/2020 06:37 12/16/2020 11:53 12/16/2020 16:29 12/16/2020 20:43 12/17/2020 06:21  Glucose-Capillary Latest Ref Range: 70 - 99 mg/dL 476 (H) 546 (H) 503 (H) 376 (H) 251 (H)    Inpatient Diabetes Program Recommendations:    Lantus 25 units BID   Addendum@13 :25:  Spokw with patient on the phone.  Reviewed patient's A1c of 13% from May (average blood sugar of 326 mg/dL). Explained what a A1c is and what it measures. Also reviewed goal A1c with patient, importance of good glucose control @ home, and blood sugar goals.  She is current with Dr. Lonzo Cloud.  She states she had gotten her A1C down to 8.6% and then starting eating bad and drinking sodas, sweet tea and lemonade.  She states she is waiting for Rybelsus and CGM prior auth and will start those once approved.  She has been checking her blood sugar 2 x a day.  Discussed short and long term complications of uncontrolled blood sugar.  She denies hypoglycemia at home but does know how to treat.  She has the LWWD booklet at bedside.  Educated on The Plate Method and importance of eliminating beverages with sugar.     Will continue to follow while inpatient.  Thank you, Dulce Sellar, RN, BSN Diabetes Coordinator Inpatient Diabetes Program [phone] (team pager from 8a-5p)

## 2020-12-17 NOTE — Progress Notes (Addendum)
Heart Failure Stewardship Pharmacist Progress Note   PCP: Zachery Dauer, FNP PCP-Cardiologist: Little Ishikawa, MD   HPI:  18 YOF with a PMH of HTN, T2DM, BLE, and CHF presenting to the ED on 12/13/20 with c/o SOB x 2 days. On admission, patient's BP was 191/104 with a HR of 108. Patient had O2 in the 70s and she was placed on CPAP then switched to BiPAP. O2 levels had improved into the upper 90s.  She also was given 80 mg of IV Lasix on admission, which she tolerated well. Patients troponin and BNP levels were also elevated. Her last ECHO in 10/2019 showed LVEF 50-55% with G2DD, trivial MV regurgitation and mild aortic valve sclerosis. Stress test performed in 12/2019 showed a further reduced LVEF of 39%. CXR on admission was positive for pulomary edema in ED. Patient was also found to be COVID+.   Current HF Medications: Furosemide IV 60 mg q8h Metoprolol XL 50 mg daily Losartan 100 mg daily  Prior to admission HF Medications: Furosemide 40 mg daily Metoprolol succinate 50 mg daily Losartan 50 mg daily  Pertinent Lab Values: Serum creatinine 1.12, BUN 34, Potassium 3.0, Sodium 137, BNP 516, Magnesium 2.0  Vital Signs: Weight: 248 lbs (admission weight: 254 lbs) Blood pressure: 120-150/90s Heart rate: 60s  Medication Assistance / Insurance Benefits Check: Does the patient have prescription insurance?  Yes Type of insurance plan: Medicaid  Outpatient Pharmacy:  Prior to admission outpatient pharmacy: [facility]) Is the patient willing to use [facility] TOC pharmacy at discharge? Yes Is the patient willing to transition their outpatient pharmacy to utilize a [facility] outpatient pharmacy?   Pending   Assessment: 1. Acute on chronic systolic CHF (EF 01%), due to possible ICM (has not completed cath or coronary CTA to further evaluate ischemia following positive stress test in 2021) dietary indiscretion, and obesity. NYHA class III symptoms. - Continue Furosemide 60 mg IV  q8h - Continue Losartan 100 mg daily. May consider switching to [facility] 24/26 for additional heart failure benefit and diuretic effects. - Continue metoprolol succinate 50 mg daily - Consider adding spironolactone 25 mg daily for additional HF optimization. Can also help reduce BP further and replete low potassium - SGLT2i not recommended for this patient at this time due to A1c 13 % (last checked in 10/2020) and CBGs consistently in 300s.    Plan: 1) Medication changes recommended at this time: - Start spironolactone 25 mg daily  2) Patient assistance: - Patient has Medicaid so all medications should be $0-$3 per medication - Sherryll Burger does require prior authorization, our team can complete this if initiated prior to discharge  3)  Education  - To be completed prior to discharge  Asencion Gowda PharmD/MBA Candidate [employer] Class of 2023

## 2020-12-17 NOTE — Significant Event (Signed)
Pt is experiencing apnea 2 secs while sleep 02 sat drop from 96 to 92 with feeling tired, suggest going back to bed and applying 2 L of 02.

## 2020-12-17 NOTE — Significant Event (Signed)
Patient does not have air at home and is having concerns about going home with cool air.

## 2020-12-18 ENCOUNTER — Other Ambulatory Visit: Payer: Self-pay

## 2020-12-18 ENCOUNTER — Other Ambulatory Visit (HOSPITAL_COMMUNITY): Payer: Self-pay

## 2020-12-18 DIAGNOSIS — E669 Obesity, unspecified: Secondary | ICD-10-CM

## 2020-12-18 DIAGNOSIS — I5033 Acute on chronic diastolic (congestive) heart failure: Secondary | ICD-10-CM | POA: Diagnosis not present

## 2020-12-18 DIAGNOSIS — I5043 Acute on chronic combined systolic (congestive) and diastolic (congestive) heart failure: Secondary | ICD-10-CM

## 2020-12-18 DIAGNOSIS — E1165 Type 2 diabetes mellitus with hyperglycemia: Secondary | ICD-10-CM

## 2020-12-18 DIAGNOSIS — U071 COVID-19: Secondary | ICD-10-CM

## 2020-12-18 LAB — CBC WITH DIFFERENTIAL/PLATELET
Abs Immature Granulocytes: 0.13 10*3/uL — ABNORMAL HIGH (ref 0.00–0.07)
Basophils Absolute: 0 10*3/uL (ref 0.0–0.1)
Basophils Relative: 0 %
Eosinophils Absolute: 0.2 10*3/uL (ref 0.0–0.5)
Eosinophils Relative: 2 %
HCT: 42.4 % (ref 36.0–46.0)
Hemoglobin: 13.9 g/dL (ref 12.0–15.0)
Immature Granulocytes: 1 %
Lymphocytes Relative: 32 %
Lymphs Abs: 3.9 10*3/uL (ref 0.7–4.0)
MCH: 27.5 pg (ref 26.0–34.0)
MCHC: 32.8 g/dL (ref 30.0–36.0)
MCV: 84 fL (ref 80.0–100.0)
Monocytes Absolute: 1 10*3/uL (ref 0.1–1.0)
Monocytes Relative: 8 %
Neutro Abs: 7.1 10*3/uL (ref 1.7–7.7)
Neutrophils Relative %: 57 %
Platelets: 233 10*3/uL (ref 150–400)
RBC: 5.05 MIL/uL (ref 3.87–5.11)
RDW: 15.4 % (ref 11.5–15.5)
WBC: 12.3 10*3/uL — ABNORMAL HIGH (ref 4.0–10.5)
nRBC: 0.2 % (ref 0.0–0.2)

## 2020-12-18 LAB — BASIC METABOLIC PANEL
Anion gap: 10 (ref 5–15)
BUN: 28 mg/dL — ABNORMAL HIGH (ref 8–23)
CO2: 34 mmol/L — ABNORMAL HIGH (ref 22–32)
Calcium: 8.5 mg/dL — ABNORMAL LOW (ref 8.9–10.3)
Chloride: 96 mmol/L — ABNORMAL LOW (ref 98–111)
Creatinine, Ser: 0.78 mg/dL (ref 0.44–1.00)
GFR, Estimated: >60 mL/min (ref >60)
Glucose, Bld: 169 mg/dL — ABNORMAL HIGH (ref 70–99)
Potassium: 3 mmol/L — ABNORMAL LOW (ref 3.5–5.1)
Sodium: 140 mmol/L (ref 135–145)

## 2020-12-18 LAB — CULTURE, BLOOD (ROUTINE X 2)
Culture: NO GROWTH
Culture: NO GROWTH
Special Requests: ADEQUATE

## 2020-12-18 LAB — PHOSPHORUS: Phosphorus: 3.1 mg/dL (ref 2.5–4.6)

## 2020-12-18 LAB — D-DIMER, QUANTITATIVE: D-Dimer, Quant: 0.32 ug/mL-FEU (ref 0.00–0.50)

## 2020-12-18 LAB — C-REACTIVE PROTEIN: CRP: 0.5 mg/dL (ref <1.0)

## 2020-12-18 LAB — MAGNESIUM: Magnesium: 1.9 mg/dL (ref 1.7–2.4)

## 2020-12-18 LAB — GLUCOSE, CAPILLARY: Glucose-Capillary: 160 mg/dL — ABNORMAL HIGH (ref 70–99)

## 2020-12-18 LAB — FERRITIN: Ferritin: 193 ng/mL (ref 11–307)

## 2020-12-18 MED ORDER — PREDNISONE 10 MG PO TABS
ORAL_TABLET | ORAL | 0 refills | Status: DC
  Filled 2020-12-18: qty 10, 4d supply, fill #0

## 2020-12-18 MED ORDER — CARVEDILOL 25 MG PO TABS
25.0000 mg | ORAL_TABLET | Freq: Two times a day (BID) | ORAL | 0 refills | Status: AC
  Filled 2020-12-18: qty 60, 30d supply, fill #0

## 2020-12-18 MED ORDER — POTASSIUM CHLORIDE CRYS ER 20 MEQ PO TBCR
20.0000 meq | EXTENDED_RELEASE_TABLET | Freq: Every day | ORAL | 0 refills | Status: DC
  Filled 2020-12-18: qty 30, 30d supply, fill #0

## 2020-12-18 MED ORDER — FUROSEMIDE 80 MG PO TABS
80.0000 mg | ORAL_TABLET | Freq: Two times a day (BID) | ORAL | 0 refills | Status: DC
  Filled 2020-12-18: qty 60, 30d supply, fill #0

## 2020-12-18 MED ORDER — POTASSIUM CHLORIDE CRYS ER 20 MEQ PO TBCR
40.0000 meq | EXTENDED_RELEASE_TABLET | ORAL | Status: AC
  Administered 2020-12-18: 40 meq via ORAL
  Filled 2020-12-18: qty 2

## 2020-12-18 MED ORDER — SPIRONOLACTONE 25 MG PO TABS
25.0000 mg | ORAL_TABLET | Freq: Every day | ORAL | 0 refills | Status: DC
  Filled 2020-12-18: qty 30, 30d supply, fill #0

## 2020-12-18 NOTE — Plan of Care (Signed)
  Problem: Education: Goal: Knowledge of General Education information will improve Description: Including pain rating scale, medication(s)/side effects and non-pharmacologic comfort measures Outcome: Progressing   Problem: Health Behavior/Discharge Planning: Goal: Ability to manage health-related needs will improve Outcome: Progressing   Problem: Clinical Measurements: Goal: Ability to maintain clinical measurements within normal limits will improve Outcome: Progressing Goal: Will remain free from infection Outcome: Progressing Goal: Diagnostic test results will improve Outcome: Progressing Goal: Respiratory complications will improve Outcome: Progressing Goal: Cardiovascular complication will be avoided Outcome: Progressing   Problem: Activity: Goal: Risk for activity intolerance will decrease Outcome: Progressing   Problem: Nutrition: Goal: Adequate nutrition will be maintained Outcome: Progressing   Problem: Coping: Goal: Level of anxiety will decrease Outcome: Progressing   Problem: Elimination: Goal: Will not experience complications related to bowel motility Outcome: Progressing Goal: Will not experience complications related to urinary retention Outcome: Progressing   Problem: Pain Managment: Goal: General experience of comfort will improve Outcome: Progressing   Problem: Safety: Goal: Ability to remain free from injury will improve Outcome: Progressing   Problem: Skin Integrity: Goal: Risk for impaired skin integrity will decrease Outcome: Progressing   Problem: Education: Goal: Ability to demonstrate management of disease process will improve Outcome: Progressing Goal: Ability to verbalize understanding of medication therapies will improve Outcome: Progressing Goal: Individualized Educational Video(s) Outcome: Progressing   Problem: Activity: Goal: Capacity to carry out activities will improve Outcome: Progressing   Problem: Cardiac: Goal:  Ability to achieve and maintain adequate cardiopulmonary perfusion will improve Outcome: Progressing   Problem: Education: Goal: Knowledge of risk factors and measures for prevention of condition will improve Outcome: Progressing   Problem: Coping: Goal: Psychosocial and spiritual needs will be supported Outcome: Progressing   Problem: Respiratory: Goal: Will maintain a patent airway Outcome: Progressing Goal: Complications related to the disease process, condition or treatment will be avoided or minimized Outcome: Progressing   

## 2020-12-18 NOTE — Progress Notes (Signed)
Occupational Therapy Treatment Patient Details Name: Whitney Sloan MRN: [id number] DOB: Oct 21, 1955 Today's Date: 12/18/2020    History of present illness 65yo female admitted 12/13/20 c/o BLE edema, weight gain, severe SOB. Found to be in acute on chronic CHF, with acute hypoxic respiratory failure, and Covid positive. PMH CHF, DM, HLD, HTN   OT comments  Patient EOB upon entry, reports eager to dc home today.  Completing transfers and mobility in room using RW with supervision.  ADLs (toileting, grooming) with supervision.  Reviewed safety with med mgmt and IADLs, as patient demonstrating difficulty with medi-cog testing-- pt reports daughter will assist at dc; anticipate cognition is baseline with decreased problem solving.  Will follow acutely.    Follow Up Recommendations  No OT follow up;Supervision - Intermittent    Equipment Recommendations  None recommended by OT    Recommendations for Other Services      Precautions / Restrictions Precautions Precautions: Fall;Other (comment) Precaution Comments: watch sats Restrictions Weight Bearing Restrictions: No       Mobility Bed Mobility Overal bed mobility: Needs Assistance             General bed mobility comments: EOB upon entry    Transfers Overall transfer level: Needs assistance Equipment used: Rolling walker (2 wheeled) Transfers: Sit to/from Stand Sit to Stand: Supervision         General transfer comment: for safety    Balance Overall balance assessment: Mild deficits observed, not formally tested                                         ADL either performed or assessed with clinical judgement   ADL Overall ADL's : Needs assistance/impaired     Grooming: Supervision/safety;Standing                   Toilet Transfer: Supervision/safety;Ambulation;RW   Toileting- Clothing Manipulation and Hygiene: Supervision/safety;Sit to/from stand       Functional mobility during  ADLs: Supervision/safety;Rolling walker General ADL Comments: supervision for safety, using RW. VSS on RA     Vision       Perception     Praxis      Cognition Arousal/Alertness: Awake/alert Behavior During Therapy: WFL for tasks assessed/performed Overall Cognitive Status: No family/caregiver present to determine baseline cognitive functioning                                 General Comments: attempted medi-cog with pt to assess ability to manage medications at dc.  Completed mini-cog with 100% accuracy, but requires max verbal cueing to problem solve medi-cog portion and unable to score.  Pt reports daughter assists with all medication mgmt and cooking at baseline after completion of test.  Recommended daughter continue to assist her.        Exercises     Shoulder Instructions       General Comments      Pertinent Vitals/ Pain       Pain Assessment: No/denies pain  Home Living                                          Prior Functioning/Environment  Frequency  Min 2X/week        Progress Toward Goals  OT Goals(current goals can now be found in the care plan section)  Progress towards OT goals: Progressing toward goals  Acute Rehab OT Goals Patient Stated Goal: home today OT Goal Formulation: With patient  Plan Discharge plan remains appropriate;Frequency remains appropriate    Co-evaluation                 AM-PAC OT "6 Clicks" Daily Activity     Outcome Measure   Help from another person eating meals?: None Help from another person taking care of personal grooming?: A Little Help from another person toileting, which includes using toliet, bedpan, or urinal?: A Little Help from another person bathing (including washing, rinsing, drying)?: A Little Help from another person to put on and taking off regular upper body clothing?: A Little Help from another person to put on and taking off regular lower  body clothing?: A Little 6 Click Score: 19    End of Session Equipment Utilized During Treatment: Rolling walker  OT Visit Diagnosis: Other abnormalities of gait and mobility (R26.89);Muscle weakness (generalized) (M62.81)   Activity Tolerance Patient tolerated treatment well   Patient Left in chair;with call bell/phone within reach;with chair alarm set   Nurse Communication Mobility status        Time: 1601-0932 OT Time Calculation (min): 36 min  Charges: OT General Charges $OT Visit: 1 Visit OT Treatments $Self Care/Home Management : 23-37 mins  Barry Brunner, OT Acute Rehabilitation Services Pager [phone] Office [phone]    [name] 12/18/2020, 10:13 AM

## 2020-12-18 NOTE — Consult Note (Signed)
   [facility] Inpatient Consult   12/18/2020  Whitney Sloan [birth year] [id number]  [facility] [[facility]]  Accountable Care Organization [ACO] Patient: Managed Medicaid  Patient was evaluated for [facility] [[facility]]  Care Management services for frequent admissions and with HX of HF exacerbation. Phone contact to patient attempted due to COVID positive however patient did not answer.   Patient transitioned home for outpatient rehab per inpatient [facility] RNCM.  Plan:  Patient is eligible for Managed Medicaid chronic care management, reviewed for referral needs. Referral placed for post hospital follow up for TOC as well.  Primary Care Provider:  Zachery Dauer, FNP noted    Of note, [facility] Care Management services does not replace or interfere with any services that are arranged by inpatient  Transition of Care [TOC] team.  For additional questions or referrals please contact:    For questions, please contact:   Charlesetta Shanks, RN BSN CCM [facility]  [phone] business mobile phone Toll free office [phone]  Fax number: [phone] [email or web address]

## 2020-12-18 NOTE — Progress Notes (Addendum)
Heart Failure Stewardship Pharmacist Progress Note   PCP: Zachery Dauer, FNP PCP-Cardiologist: Little Ishikawa, MD   HPI:  79 YOF with a PMH of HTN, T2DM, BLE, and CHF presenting to the ED on 12/13/20 with c/o SOB x 2 days. On admission, patient's BP was 191/104 with a HR of 108. Patient had O2 in the 70s and she was placed on CPAP then switched to BiPAP. O2 levels had improved into the upper 90s.  She also was given 80 mg of IV Lasix on admission, which she tolerated well. Patients troponin and BNP levels were also elevated. Her last ECHO in 10/2019 showed LVEF 50-55% with G2DD, trivial MV regurgitation and mild aortic valve sclerosis. Stress test performed in 12/2019 showed a further reduced LVEF of 39%. CXR on admission was positive for pulomary edema in ED. Patient was also found to be COVID+.   Discharge HF Medications: Furosemide 80 mg BID Losartan 100 mg daily Carvedilol 25 mg BID Spironolactone 25 mg daily  Prior to admission HF Medications: Furosemide 40 mg daily Metoprolol succinate 50 mg daily Losartan 50 mg daily  Pertinent Lab Values: Serum creatinine 0.78 (improved from 1.12), BUN 28, Potassium 3.0, Sodium 140, BNP 516, Magnesium 1.9  Vital Signs: Weight: 245 lbs (admission weight: 254 lbs) Blood pressure: 160s/90s Heart rate: 70s  Medication Assistance / Insurance Benefits Check: Does the patient have prescription insurance?  Yes Type of insurance plan: Medicaid  Outpatient Pharmacy:  [facility] to admission [facility]) Is the patient willing to use [facility] TOC pharmacy at discharge? Yes Is the patient willing to transition their outpatient pharmacy to utilize a [facility] outpatient pharmacy?   Pending   Assessment: 1. Acute on chronic systolic CHF (EF 47%), due to possible ICM (has not completed cath or coronary CTA to further evaluate ischemia following positive stress test in 2021) dietary indiscretion, and obesity. NYHA class III  symptoms. Patient plans to be discharged today.  - Agee with furosemide 80 mg BID  - Agree with carvedilol 25mg  BID - Agree with spironolactone 25 mg daily - Continue Losartan 100 mg daily. May consider switching to [facility] 24/26 for additional heart failure benefit and diuretic effects in outpatient.  - SGLT2i not recommended for this patient at this time due to A1c 13 % (last checked in 10/2020)   Plan: 1) Medication changes recommended at this time: - Continue current regimen; discharge today  2) Patient assistance: - Patient has Medicaid so all medications should be $0-$3 per medication - 11/2020 does require prior authorization, our team can complete this if initiated prior to discharge  3)  Education  - Patient has been educated on current HF medications and potential additions to HF medication regimen - Patient verbalizes understanding that over the next few months, these medication doses may change and more medications may be added to optimize HF regimen - Patient has been educated on basic disease state pathophysiology and goals of therapy   Sherryll Burger PharmD/MBA Candidate [employer] Class of 2023

## 2020-12-18 NOTE — Discharge Summary (Signed)
Discharge Summary  Whitney Sloan GOT:[id number] DOB: 30-Oct-1955  PCP: Zachery Dauer, FNP  Admit date: 12/13/2020 Discharge date: 12/18/2020  Time spent: , more than 50% time spent on coordination of care.   Recommendations for Outpatient Follow-up:  F/u with PCP within a week  for hospital discharge follow up, repeat cbc/bmp at follow up F/u with heart failure clinic on 7/18 F/u with cardiology Dr Marton [facility] PT/RN ( RN for heart failure management)  Discharge Diagnoses:  Active Hospital Problems   Diagnosis Date Noted   COVID-19 virus infection 12/13/2020   Hypertensive urgency 11/08/2019   Acute on chronic combined systolic and diastolic CHF (congestive heart failure) (HCC) 11/08/2019   Uncontrolled type 2 diabetes mellitus with hyperglycemia, with long-term current use of insulin (HCC) 08/06/2009                  Discharge Condition: stable  Diet recommendation: heart healthy/carb modified  Filed Weights   12/15/20 0500 12/17/20 0344 12/18/20 0014  Weight: 116.2 kg 112.6 kg 111.2 kg    History of present illness: ( per admitting MD Dr Antionette Char) Chief Complaint: SOB, increased swelling, weight gain    HPI: Whitney Sloan is a 65 y.o. female with medical history significant for hypertension, insulin-dependent diabetes mellitus, BMI 40, and chronic diastolic CHF, now presenting to the emergency department for evaluation of shortness of breath, swelling, and weight gain.  Patient reports increased bilateral lower extremity swelling, swelling into the stomach, 10 pound weight gain, and 2 days of worsening shortness of breath becoming severe today.  Patient reports adherence with 80 mg Lasix daily (though appears to be prescribed twice daily), but notes recent dietary indiscretion while vacationing at the beach.  She has been coughing with some frothy sputum but denies fevers, chills, or chest pain.  She was reportedly saturating in the 2s with EMS and was transported  to the ED on CPAP.   ED Course: Upon arrival to the ED, patient is found to be afebrile, saturating upper 90s on BiPAP, tachypneic, slightly tachycardic, and with blood pressure 190/100s.  Chemistry panel notable for glucose 392.  CBC unremarkable.  Troponin 26 and BNP 516.  Chest x-ray concerning for pulmonary edema.  Blood cultures were collected and the patient was started on nitroglycerin infusion, BiPAP, and given 80 mg IV Lasix.  Hospital Course:  Active Problems:   Uncontrolled type 2 diabetes mellitus with hyperglycemia, with long-term current use of insulin (HCC)   Acute on chronic combined systolic and diastolic CHF (congestive heart failure) (HCC)   Hypertensive urgency   COVID-19 virus infection  Acute on chronic combined  CHF exacerbation -presents with acute hypoxic respiratory failure,  bilateral lower extremity edema, pulmonary edema, hypertension urgency -last lvef 39% on stress test done on 12/31/2019 --treated with IV Lasix 80 mg 3 times a day , repeat chest x-ray improved pulmonary edema, she is weaned off oxygen, now on room air -discharged on oral Lasix 80 mg twice a day -start on spironolactone 25mg  daily -change toprol xl to coreg -increase cozaar back to 100mg  daily ( she reports taking 50mg  daily) -d/c diltiazem -case discussed with heart failure pharm D who will see patient in heart failure clinic on 7/18 to decide on entresto    Hypokalemia Replace K   COVID-19 infection She is not vaccinated Reports some URI symptom, no cough CRP is not elevated, ddimer was elevated has normalized at discharge She received IV remdesivir and steroid due to hypoxia requiring  oxygen supplement She is now off oxygen, on room air  She is discharged home on prednisone taper over the next 4 days   Insulin-dependent type 2 diabetes, uncontrolled -A1c 13% in 10/2020 -has hyperglycemia in the hospital, partly due to steroids, she will off taper off steroids in the next 4  days -She is continued on her home diabetes regimen, she is  to follow up with her endocrinologist for better diabetes control   Hypertension urgency Blood pressure 191/104 with EMS Blood pressure has improved She is discharged on coreg, cozaar, lasix, spironolactone     Class III obesity: Body mass index is 41.3 kg/m.Marland Kitchen. Reports had sleep study , she does not have sleep apnea    Procedures: none  Consultations: Phone conversation with heart failure pharmD  Discharge Exam: BP (!) 166/94 (BP Location: Left Arm)   Pulse 75   Temp 97.6 F (36.4 C) (Oral)   Resp 20   Ht 5\' 5"  (1.651 m)   Wt 111.2 kg   SpO2 96%   BMI 40.79 kg/m   General: NAD Cardiovascular: RRR Respiratory: CTABL  Discharge Instructions You were cared for by a hospitalist during your hospital stay. If you have any questions about your discharge medications or the care you received while you were in the hospital after you are discharged, you can call the unit and asked to speak with the hospitalist on call if the hospitalist that took care of you is not available. Once you are discharged, your primary care physician will handle any further medical issues. Please note that NO REFILLS for any discharge medications will be authorized once you are discharged, as it is imperative that you return to your primary care physician (or establish a relationship with a primary care physician if you do not have one) for your aftercare needs so that they can reassess your need for medications and monitor your lab values.  Discharge Instructions     (HEART FAILURE PATIENTS) Call MD:  Anytime you have any of the following symptoms: 1) 3 pound weight gain in 24 hours or 5 pounds in 1 week 2) shortness of breath, with or without a dry hacking cough 3) swelling in the hands, feet or stomach 4) if you have to sleep on extra pillows at night in order to breathe.   Complete by: As directed    Diet - low sodium heart healthy   Complete  by: As directed    Carb modified diet   Increase activity slowly   Complete by: As directed       Allergies as of 12/18/2020       Reactions   Oxycodone-acetaminophen Nausea And Vomiting        Medication List     STOP taking these medications    diclofenac 75 MG EC tablet Commonly known as: VOLTAREN   diltiazem 240 MG 24 hr capsule Commonly known as: DILACOR XR   metoprolol succinate 50 MG 24 hr tablet Commonly known as: TOPROL-XL   metoprolol tartrate 100 MG tablet Commonly known as: LOPRESSOR       TAKE these medications    Aspir-Low 81 MG EC tablet Generic drug: aspirin Take 81 mg by mouth every morning.   atorvastatin 20 MG tablet Commonly known as: LIPITOR Take 20 mg by mouth every evening.   carvedilol 25 MG tablet Commonly known as: Coreg Take 1 tablet (25 mg total) by mouth 2 (two) times daily.   clotrimazole 1 % vaginal cream Commonly known as:  GYNE-LOTRIMIN Place 1 Applicatorful vaginally daily as needed (yeast).   Dexcom G6 Receiver Devi 1 Device by Does not apply route as directed.   Dexcom G6 Sensor Misc 1 Device by Does not apply route as directed.   Dexcom G6 Transmitter Misc 1 Device by Does not apply route as directed.   Diclofenac Sodium 3 % Gel Apply 2 g topically 2 (two) times daily as needed.   Ex-Lax 15 MG Tabs Generic drug: Sennosides Take 15 mg by mouth daily as needed (constipation).   ezetimibe 10 MG tablet Commonly known as: ZETIA Take 10 mg by mouth at bedtime.   furosemide 80 MG tablet Commonly known as: LASIX Take 1 tablet (80 mg total) by mouth 2 (two) times daily. What changed: Another medication with the same name was removed. Continue taking this medication, and follow the directions you see here.   ibuprofen 200 MG tablet Commonly known as: ADVIL Take 400 mg by mouth every 4 (four) hours as needed for fever, headache or moderate pain.   Insulin Pen Needle 32G X 4 MM Misc 1 Device by Does not apply  route as directed.   Lantus SoloStar 100 UNIT/ML Solostar Pen Generic drug: insulin glargine Inject 40 Units into the skin daily.   losartan 100 MG tablet Commonly known as: COZAAR Take 1 tablet (100 mg total) by mouth daily. What changed: how much to take   magnesium oxide 400 MG tablet Commonly known as: MAG-OX Take 1 tablet (400 mg total) by mouth daily.   metFORMIN 500 MG tablet Commonly known as: GLUCOPHAGE Take 500 mg by mouth daily with breakfast. What changed: Another medication with the same name was removed. Continue taking this medication, and follow the directions you see here.   nitroGLYCERIN 0.4 MG SL tablet Commonly known as: NITROSTAT Place 1 tablet (0.4 mg total) under the tongue every 5 (five) minutes as needed for chest pain.   NovoLOG FlexPen 100 UNIT/ML FlexPen Generic drug: insulin aspart Inject 18 Units into the skin 3 (three) times daily with meals.   nystatin powder Commonly known as: MYCOSTATIN/NYSTOP Apply 1 application topically daily as needed (yeast).   polyethylene glycol 17 g packet Commonly known as: MIRALAX / GLYCOLAX Take 17 g by mouth daily.   potassium chloride SA 20 MEQ tablet Commonly known as: KLOR-CON Take 1 tablet (20 mEq total) by mouth daily.   predniSONE 10 MG tablet Commonly known as: DELTASONE Label  & dispense according to the schedule below. 4Pills PO on day one then, 3 Pills PO on day two, 2 Pills PO on day three, 1 Pill PO on day four,   then STOP.  Total of 10 tabs   Rybelsus 3 MG Tabs Generic drug: Semaglutide Take 3 mg by mouth daily before breakfast.   Rybelsus 7 MG Tabs Generic drug: Semaglutide Take 7 mg by mouth daily before breakfast.   spironolactone 25 MG tablet Commonly known as: Aldactone Take 1 tablet (25 mg total) by mouth daily.   tamsulosin 0.4 MG Caps capsule Commonly known as: FLOMAX Take 0.4 mg by mouth daily.   VITAMIN D PO Take 1 capsule by mouth daily.       Allergies  Allergen  Reactions   Oxycodone-Acetaminophen Nausea And Vomiting    Follow-up Information     [facility] Follow up.   Specialty: Rehabilitation Why: please call to schedule your apt times. Contact information: [address] [id number] mc [place] [address] [phone]  The results of significant diagnostics from this hospitalization (including imaging, microbiology, ancillary and laboratory) are listed below for reference.    Significant Diagnostic Studies: DG CHEST PORT 1 VIEW  Result Date: 12/17/2020 CLINICAL DATA:  Shortness of breath, COVID-19 positive EXAM: PORTABLE CHEST 1 VIEW COMPARISON:  Portable exam 1456 hours compared to 12/13/2020 FINDINGS: Enlargement of cardiac silhouette. Mediastinal contours and pulmonary vascularity normal. Peribronchial thickening without infiltrate, pleural effusion, or pneumothorax. Improved pulmonary edema is versus previous exam. Osseous structures unremarkable. IMPRESSION: Improved pulmonary edema since prior study. Electronically Signed   By: Ulyses Southward M.D.   On: 12/17/2020 16:52   DG Chest Port 1 View  Result Date: 12/13/2020 CLINICAL DATA:  Dyspnea.  Shortness of breath. EXAM: PORTABLE CHEST 1 VIEW COMPARISON:  12/10/2019 FINDINGS: Heart is mildly enlarged, stable in configuration. There is increased interstitial pulmonary edema compared to prior study. More confluent opacity at the LEFT lung base likely represents pulmonary edema but infectious infiltrate could have a similar appearance. IMPRESSION: Findings consistent with pulmonary edema. Electronically Signed   By: Norva Pavlov M.D.   On: 12/13/2020 18:58    Microbiology: Recent Results (from the past 240 hour(s))  Culture, blood (routine x 2)     Status: None   Collection Time: 12/13/20  6:20 PM   Specimen: BLOOD RIGHT FOREARM  Result Value Ref Range Status   Specimen Description BLOOD RIGHT FOREARM  Final    Special Requests   Final    BOTTLES DRAWN AEROBIC AND ANAEROBIC Blood Culture results may not be optimal due to an inadequate volume of blood received in culture bottles   Culture   Final    NO GROWTH 5 DAYS Performed at [facility], [address], [place], [place] [address]    Report Status 12/18/2020 FINAL  Final  Culture, blood (routine x 2)     Status: None   Collection Time: 12/13/20  6:23 PM   Specimen: BLOOD  Result Value Ref Range Status   Specimen Description BLOOD RIGHT ANTECUBITAL  Final   Special Requests   Final    BOTTLES DRAWN AEROBIC AND ANAEROBIC Blood Culture adequate volume   Culture   Final    NO GROWTH 5 DAYS Performed at [facility], [address], [place], [place] [address]    Report Status 12/18/2020 FINAL  Final  Resp Panel by RT-PCR (Flu A&B, Covid) Nasopharyngeal Swab     Status: Abnormal   Collection Time: 12/13/20  7:20 PM   Specimen: Nasopharyngeal Swab; Nasopharyngeal(NP) swabs in vial transport medium  Result Value Ref Range Status   SARS Coronavirus 2 by RT PCR POSITIVE (A) NEGATIVE Final    Comment: RESULT CALLED TO, READ BACK BY AND VERIFIED WITH: RN CANDICE NUCKLES BY MESSAN H. AT 2052 ON 12/13/2020 (NOTE) SARS-CoV-2 target nucleic acids are DETECTED.  The SARS-CoV-2 RNA is generally detectable in upper respiratory specimens during the acute phase of infection. Positive results are indicative of the presence of the identified virus, but do not rule out bacterial infection or co-infection with other pathogens not detected by the test. Clinical correlation with patient history and other diagnostic information is necessary to determine patient infection status. The expected result is Negative.  Fact Sheet for Patients: BloggerCourse.com  Fact Sheet for Healthcare Providers: SeriousBroker.it  This test is not yet approved or cleared by the [place] FDA and  has been  authorized for detection and/or diagnosis of SARS-CoV-2 by FDA under an Emergency Use Authorization (  EUA).  This EUA will remain in effect (meanin g this test can be used) for the duration of  the COVID-19 declaration under Section 564(b)(1) of the Act, 21 U.S.C. section 360bbb-3(b)(1), unless the authorization is terminated or revoked sooner.     Influenza A by PCR NEGATIVE NEGATIVE Final   Influenza B by PCR NEGATIVE NEGATIVE Final    Comment: (NOTE) The Xpert Xpress SARS-CoV-2/FLU/RSV plus assay is intended as an aid in the diagnosis of influenza from Nasopharyngeal swab specimens and should not be used as a sole basis for treatment. Nasal washings and aspirates are unacceptable for Xpert Xpress SARS-CoV-2/FLU/RSV testing.  Fact Sheet for Patients: BloggerCourse.com  Fact Sheet for Healthcare Providers: SeriousBroker.it  This test is not yet approved or cleared by the [place] FDA and has been authorized for detection and/or diagnosis of SARS-CoV-2 by FDA under an Emergency Use Authorization (EUA). This EUA will remain in effect (meaning this test can be used) for the duration of the COVID-19 declaration under Section 564(b)(1) of the Act, 21 U.S.C. section 360bbb-3(b)(1), unless the authorization is terminated or revoked.  Performed at [facility], [address] [place], [place] [address]      Labs: Basic Metabolic Panel: Recent Labs  Lab 12/14/20 0328 12/15/20 0329 12/16/20 0354 12/17/20 0318 12/18/20 0505  NA 139 138 136 137 140  K 4.1 3.8 3.3* 3.0* 3.0*  CL 104 101 95* 96* 96*  CO2 26 30 30  34* 34*  GLUCOSE 378* 357* 379* 279* 169*  BUN 14 26* 33* 34* 28*  CREATININE 0.92 0.94 1.11* 1.12* 0.78  CALCIUM 8.4* 8.3* 8.5* 8.3* 8.5*  MG 1.8 2.0 2.0 2.0 1.9  PHOS 4.5 4.0 4.0 3.4 3.1   Liver Function Tests: Recent Labs  Lab 12/13/20 1754 12/14/20 0328 12/15/20 0329 12/16/20 0354 12/17/20 0318   AST 18 17 18 19 18   ALT 15 13 13 16 15   ALKPHOS 76 67 60 61 59  BILITOT 0.8 0.9 0.8 0.4 0.9  PROT 6.9 6.6 6.3* 6.2* 6.3*  ALBUMIN 2.9* 2.6* 2.5* 2.5* 2.5*   No results for input(s): LIPASE, AMYLASE in the last 168 hours. No results for input(s): AMMONIA in the last 168 hours. CBC: Recent Labs  Lab 12/14/20 0328 12/15/20 0329 12/16/20 0354 12/17/20 0318 12/18/20 0505  WBC 7.3 12.8* 14.7* 14.3* 12.3*  NEUTROABS 6.0 10.0* 12.0* 10.7* 7.1  HGB 12.2 12.2 11.9* 13.2 13.9  HCT 39.1 37.9 37.6 41.1 42.4  MCV 89.1 86.3 86.2 84.9 84.0  PLT 255 245 243 262 233   Cardiac Enzymes: No results for input(s): CKTOTAL, CKMB, CKMBINDEX, TROPONINI in the last 168 hours. BNP: BNP (last 3 results) Recent Labs    12/13/20 1754  BNP 516.0*    ProBNP (last 3 results) No results for input(s): PROBNP in the last 8760 hours.  CBG: Recent Labs  Lab 12/17/20 0621 12/17/20 1130 12/17/20 1733 12/17/20 2027 12/18/20 0541  GLUCAP 251* 233* 286* 322* 160*       Signed:  02/17/21 MD, PhD, FACP  [facility] 12/18/2020, 8:44 AM

## 2020-12-20 DIAGNOSIS — U071 COVID-19: Secondary | ICD-10-CM | POA: Diagnosis not present

## 2020-12-25 ENCOUNTER — Telehealth (HOSPITAL_COMMUNITY): Payer: Self-pay | Admitting: Licensed Clinical Social Worker

## 2020-12-25 DIAGNOSIS — U071 COVID-19: Secondary | ICD-10-CM | POA: Diagnosis not present

## 2020-12-25 NOTE — Telephone Encounter (Signed)
CSW contacted patient to remind of Heart Impact appointment on Monday December 28, 2020. Patient confirmed appointment. Jackie Isra Lindy, LCSW, CCSW-MCS [phone] 

## 2020-12-28 ENCOUNTER — Inpatient Hospital Stay (HOSPITAL_COMMUNITY): Admit: 2020-12-28 | Discharge: 2020-12-28 | Disposition: A | Discharge status: Home / Self Care | Payer: Medicaid Other

## 2021-01-07 DIAGNOSIS — I5030 Unspecified diastolic (congestive) heart failure: Secondary | ICD-10-CM | POA: Diagnosis not present

## 2021-01-08 ENCOUNTER — Encounter (HOSPITAL_COMMUNITY): Payer: Self-pay | Admitting: Emergency Medicine

## 2021-01-08 ENCOUNTER — Ambulatory Visit (HOSPITAL_COMMUNITY)
Admission: EM | Admit: 2021-01-08 | Discharge: 2021-01-08 | Disposition: A | Discharge status: Home / Self Care | Payer: Medicare Other | Attending: Student | Admitting: Student

## 2021-01-08 ENCOUNTER — Other Ambulatory Visit: Payer: Self-pay

## 2021-01-08 DIAGNOSIS — B373 Candidiasis of vulva and vagina: Secondary | ICD-10-CM | POA: Insufficient documentation

## 2021-01-08 DIAGNOSIS — I10 Essential (primary) hypertension: Secondary | ICD-10-CM | POA: Insufficient documentation

## 2021-01-08 DIAGNOSIS — B3731 Acute candidiasis of vulva and vagina: Secondary | ICD-10-CM

## 2021-01-08 MED ORDER — FLUCONAZOLE 150 MG PO TABS: 150.0000 mg | ORAL_TABLET | Freq: Every day | ORAL | 0 refills | Status: DC

## 2021-01-08 MED ORDER — MICONAZOLE NITRATE 2 % VA CREA: 1.0000 | TOPICAL_CREAM | Freq: Every day | VAGINAL | 0 refills | Status: AC

## 2021-01-08 NOTE — Discharge Instructions (Addendum)
-  For your yeast infection, start the Diflucan (fluconazole)- Take one pill today (day 1). If you're still having symptoms in 3 days, take the second pill.  -Monistat cream at bedtime while symptoms persist. -We'll call you if we need to change anything based on your test results -Please check your blood pressure at home or at the pharmacy. If this continues to be >140/90, follow-up with your primary care provider for further blood pressure management/ medication titration. If you develop chest pain, shortness of breath, vision changes, the worst headache of your life- head straight to the ED or call 911.

## 2021-01-08 NOTE — ED Triage Notes (Signed)
Vaginal itching, soreness, white discharge x 2 weeks. Recently discharged from hospital, unsure if she received antibiotics while there. Believes she may have a yeast infection

## 2021-01-08 NOTE — ED Provider Notes (Signed)
[facility]    CSN: [id number] Arrival date & time: 01/08/21  1720      History   Chief Complaint Chief Complaint  Patient presents with   Vaginal Discharge    HPI Whitney Sloan is a 65 y.o. female presenting with thick white vaginal discharge and external vaginal itching for about 2 weeks.  Medical history-recent hospitalization, patient thinks that she was on antibiotics during her stay.  Denies urinary symptoms like abdominal pain, back pain, fever/chills, urinary frequency/urgency.   HPI  Past Medical History:  Diagnosis Date   Abnormal stress test 12/31/2019   medium defect of moderate severity present in the basal inferior, mid inferior and apex location.  Findings consistent with ischemia   Chronic diastolic (congestive) heart failure (HCC)    Diabetes mellitus    Hyperlipemia    Hypertension    Psoriasis     Patient Active Problem List   Diagnosis Date Noted   COVID-19 virus infection 12/13/2020   Type 2 diabetes mellitus with microalbuminuria, with long-term current use of insulin (HCC) 10/15/2020   Right knee pain 04/10/2020   Chest pain of uncertain etiology 12/19/2019   CAP (community acquired pneumonia) 12/19/2019   Constipation 12/10/2019   Cholelithiasis 12/10/2019   Thyroid nodule 12/10/2019   Obesity (BMI 30-39.9) 12/10/2019   Acute on chronic combined systolic and diastolic CHF (congestive heart failure) (HCC) 11/08/2019   Acute respiratory failure with hypoxia (HCC) 11/08/2019   Hypertensive urgency 11/08/2019   Mixed diabetic hyperlipidemia associated with type 2 diabetes mellitus (HCC) 11/08/2019   Type 2 diabetes mellitus with diabetic polyneuropathy, with long-term current use of insulin (HCC) 08/08/2019   Dyslipidemia 03/23/2010   DECREASED HEARING, BILATERAL 02/05/2010   MEMORY LOSS 02/05/2010   INCONTINENCE 02/05/2010   NEOPLASM, MALIGNANT, BREAST, HX OF 01/13/2010   ANXIETY STATE, UNSPECIFIED 12/02/2009   UNSTEADY GAIT  12/02/2009   TOE PAIN 09/24/2009   ANAL OR RECTAL PAIN 08/25/2009   LEG EDEMA, BILATERAL 08/25/2009   Uncontrolled type 2 diabetes mellitus with hyperglycemia, with long-term current use of insulin (HCC) 08/06/2009   Essential hypertension 08/06/2009   DERMATITIS, ATOPIC 08/06/2009    Past Surgical History:  Procedure Laterality Date   ANAL FISSURE REPAIR     ENDOMETRIAL ABLATION      OB History   No obstetric history on file.      Home Medications    Prior to Admission medications   Medication Sig Start Date End Date Taking? Authorizing Provider  fluconazole (DIFLUCAN) 150 MG tablet Take 1 tablet (150 mg total) by mouth daily. -For your yeast infection, start the Diflucan (fluconazole)- Take one pill today (day 1). If you're still having symptoms in 3 days, take the second pill. 01/08/21  Yes Rhys Martini, PA-C  miconazole (MONISTAT 7) 2 % vaginal cream Place 1 Applicatorful vaginally at bedtime for 7 days. 01/08/21 01/15/21 Yes Rhys Martini, PA-C  aspirin (ASPIR-LOW) 81 MG EC tablet Take 81 mg by mouth every morning.    [provider]  atorvastatin (LIPITOR) 20 MG tablet Take 20 mg by mouth every evening. 11/21/20   [provider]  carvedilol (COREG) 25 MG tablet Take 1 tablet (25 mg total) by mouth 2 (two) times daily. 12/18/20 12/18/21  Albertine Grates, MD  Cholecalciferol (VITAMIN D PO) Take 1 capsule by mouth daily.    [provider]  clotrimazole (GYNE-LOTRIMIN) 1 % vaginal cream Place 1 Applicatorful vaginally daily as needed (yeast).    [provider]  Continuous Blood Gluc Receiver (DEXCOM G6 RECEIVER) DEVI 1 Device by Does not apply route as directed. 10/14/20   Shamleffer, Konrad Dolores, MD  Continuous Blood Gluc Sensor (DEXCOM G6 SENSOR) MISC 1 Device by Does not apply route as directed. 10/14/20   Shamleffer, Konrad Dolores, MD  Continuous Blood Gluc Transmit (DEXCOM G6 TRANSMITTER) MISC 1 Device by Does not apply route as directed. 10/14/20    Shamleffer, Konrad Dolores, MD  Diclofenac Sodium 3 % GEL Apply 2 g topically 2 (two) times daily as needed. 04/10/20   Nestor Ramp, MD  ezetimibe (ZETIA) 10 MG tablet Take 10 mg by mouth at bedtime. 08/21/19   [provider]  furosemide (LASIX) 80 MG tablet Take 1 tablet (80 mg total) by mouth 2 (two) times daily. 12/18/20   Albertine Grates, MD  ibuprofen (ADVIL) 200 MG tablet Take 400 mg by mouth every 4 (four) hours as needed for fever, headache or moderate pain.     [provider]  insulin aspart (NOVOLOG FLEXPEN) 100 UNIT/ML FlexPen Inject 18 Units into the skin 3 (three) times daily with meals. 03/26/20   Shamleffer, Konrad Dolores, MD  insulin glargine (LANTUS SOLOSTAR) 100 UNIT/ML Solostar Pen Inject 40 Units into the skin daily. 10/14/20   Shamleffer, Konrad Dolores, MD  Insulin Pen Needle 32G X 4 MM MISC 1 Device by Does not apply route as directed. 03/26/20   Shamleffer, Konrad Dolores, MD  losartan (COZAAR) 100 MG tablet Take 1 tablet (100 mg total) by mouth daily. 10/15/20   Shamleffer, Konrad Dolores, MD  magnesium oxide (MAG-OX) 400 MG tablet Take 1 tablet (400 mg total) by mouth daily. 01/03/20   Little Ishikawa, MD  metFORMIN (GLUCOPHAGE) 500 MG tablet Take 500 mg by mouth daily with breakfast. 11/16/20   [provider]  nitroGLYCERIN (NITROSTAT) 0.4 MG SL tablet Place 1 tablet (0.4 mg total) under the tongue every 5 (five) minutes as needed for chest pain. Patient not taking: Reported on 10/14/2020 01/23/20 04/22/20  Barrett, Joline Salt, PA-C  nystatin (MYCOSTATIN/NYSTOP) powder Apply 1 application topically daily as needed (yeast).    [provider]  polyethylene glycol (MIRALAX / GLYCOLAX) 17 g packet Take 17 g by mouth daily.    [provider]  potassium chloride SA (KLOR-CON) 20 MEQ tablet Take 1 tablet (20 mEq total) by mouth daily. 12/18/20 03/18/21  Albertine Grates, MD  predniSONE (DELTASONE) 10 MG tablet take 4 tabs by mouth on day 1, then 3  tabs on day two, 2 tabs on day three, 1 tabs on day four,   then STOP. 12/18/20   Albertine Grates, MD  Semaglutide (RYBELSUS) 3 MG TABS Take 3 mg by mouth daily before breakfast. 10/14/20   Shamleffer, Konrad Dolores, MD  Semaglutide (RYBELSUS) 7 MG TABS Take 7 mg by mouth daily before breakfast. 10/14/20   Shamleffer, Konrad Dolores, MD  Sennosides (EX-LAX) 15 MG TABS Take 15 mg by mouth daily as needed (constipation).    [provider]  spironolactone (ALDACTONE) 25 MG tablet Take 1 tablet (25 mg total) by mouth daily. 12/18/20 12/18/21  Albertine Grates, MD  tamsulosin (FLOMAX) 0.4 MG CAPS capsule Take 0.4 mg by mouth daily.    [provider]    Family History Family History  Problem Relation Age of Onset   Dementia Mother    Renal Disease Father    Diabetes Other     Social History Social History   Tobacco Use   Smoking  status: Never   Smokeless tobacco: Never  Substance Use Topics   Alcohol use: No   Drug use: No     Allergies   Oxycodone-acetaminophen   Review of Systems Review of Systems  Constitutional:  Negative for appetite change, chills, diaphoresis and fever.  Respiratory:  Negative for shortness of breath.   Cardiovascular:  Negative for chest pain.  Gastrointestinal:  Negative for abdominal pain, blood in stool, constipation, diarrhea, nausea and vomiting.  Genitourinary:  Positive for vaginal discharge. Negative for decreased urine volume, difficulty urinating, dysuria, flank pain, frequency, genital sores, hematuria and urgency.  Musculoskeletal:  Negative for back pain.  Neurological:  Negative for dizziness, weakness and light-headedness.  All other systems reviewed and are negative.   Physical Exam Triage Vital Signs ED Triage Vitals [01/08/21 1820]  Enc Vitals Group     BP (!) 181/83     Pulse Rate 91     Resp 18     Temp 99.2 F (37.3 C)     Temp Source Oral     SpO2 95 %     Weight      Height      Head Circumference      Peak Flow       Pain Score 0     Pain Loc      Pain Edu?      Excl. in GC?    No data found.  Updated Vital Signs BP (!) 181/83 (BP Location: Left Arm)   Pulse 91   Temp 99.2 F (37.3 C) (Oral)   Resp 18   SpO2 95%   Visual Acuity Right Eye Distance:   Left Eye Distance:   Bilateral Distance:    Right Eye Near:   Left Eye Near:    Bilateral Near:     Physical Exam Vitals reviewed.  Constitutional:      General: She is not in acute distress.    Appearance: Normal appearance. She is not ill-appearing.  HENT:     Head: Normocephalic and atraumatic.     Mouth/Throat:     Mouth: Mucous membranes are moist.     Comments: Moist mucous membranes Eyes:     Extraocular Movements: Extraocular movements intact.     Pupils: Pupils are equal, round, and reactive to light.  Cardiovascular:     Rate and Rhythm: Normal rate and regular rhythm.     Heart sounds: Normal heart sounds.  Pulmonary:     Effort: Pulmonary effort is normal.     Breath sounds: Normal breath sounds. No wheezing, rhonchi or rales.  Abdominal:     General: Bowel sounds are normal. There is no distension.     Palpations: Abdomen is soft. There is no mass.     Tenderness: There is no abdominal tenderness. There is no right CVA tenderness, left CVA tenderness, guarding or rebound.  Genitourinary:    Comments: deferred Skin:    General: Skin is warm.     Capillary Refill: Capillary refill takes less than 2 seconds.     Comments: Good skin turgor  Neurological:     General: No focal deficit present.     Mental Status: She is alert and oriented to person, place, and time.  Psychiatric:        Mood and Affect: Mood normal.        Behavior: Behavior normal.     UC Treatments / Results  Labs (all labs ordered are listed, but only abnormal results are  displayed) Labs Reviewed  CERVICOVAGINAL ANCILLARY ONLY    EKG   Radiology No results found.  Procedures Procedures (including critical care time)  Medications  Ordered in UC Medications - No data to display  Initial Impression / Assessment and Plan / UC Course  I have reviewed the triage vital signs and the nursing notes.  Pertinent labs & imaging results that were available during my care of the patient were reviewed by me and considered in my medical decision making (see chart for details).     This patient is a very pleasant 65 y.o. year old female presenting with suspected vaginal candidiasis.  Afebrile, nontachycardic, no abdominal pain or CVAT.  Diflucan, Monistat for symptomatic relief. ED return precautions discussed. Patient verbalizes understanding and agreement.    Final Clinical Impressions(s) / UC Diagnoses   Final diagnoses:  Vaginal yeast infection     Discharge Instructions      -For your yeast infection, start the Diflucan (fluconazole)- Take one pill today (day 1). If you're still having symptoms in 3 days, take the second pill.  -Monistat cream at bedtime while symptoms persist. -We'll call you if we need to change anything based on your test results   ED Prescriptions     Medication Sig Dispense Auth. Provider   fluconazole (DIFLUCAN) 150 MG tablet Take 1 tablet (150 mg total) by mouth daily. -For your yeast infection, start the Diflucan (fluconazole)- Take one pill today (day 1). If you're still having symptoms in 3 days, take the second pill. 2 tablet Rhys Martini, PA-C   miconazole (MONISTAT 7) 2 % vaginal cream Place 1 Applicatorful vaginally at bedtime for 7 days. 45 g Rhys Martini, PA-C      PDMP not reviewed this encounter.   Rhys Martini, PA-C 01/08/21 1859

## 2021-01-11 LAB — CERVICOVAGINAL ANCILLARY ONLY
Bacterial Vaginitis (gardnerella): NEGATIVE
Candida Glabrata: NEGATIVE
Candida Vaginitis: POSITIVE — AB
Chlamydia: NEGATIVE
Comment: NEGATIVE
Comment: NEGATIVE
Comment: NEGATIVE
Comment: NEGATIVE
Comment: NEGATIVE
Comment: NORMAL
Neisseria Gonorrhea: NEGATIVE
Trichomonas: NEGATIVE

## 2021-01-18 DIAGNOSIS — I5030 Unspecified diastolic (congestive) heart failure: Secondary | ICD-10-CM | POA: Diagnosis not present

## 2021-01-25 ENCOUNTER — Encounter: Payer: Self-pay | Admitting: *Deleted

## 2021-01-25 ENCOUNTER — Other Ambulatory Visit (HOSPITAL_COMMUNITY): Payer: Self-pay | Admitting: *Deleted

## 2021-02-03 DIAGNOSIS — R5383 Other fatigue: Secondary | ICD-10-CM | POA: Diagnosis not present

## 2021-02-03 DIAGNOSIS — E119 Type 2 diabetes mellitus without complications: Secondary | ICD-10-CM | POA: Diagnosis not present

## 2021-02-03 DIAGNOSIS — E785 Hyperlipidemia, unspecified: Secondary | ICD-10-CM | POA: Diagnosis not present

## 2021-02-03 DIAGNOSIS — I159 Secondary hypertension, unspecified: Secondary | ICD-10-CM | POA: Diagnosis not present

## 2021-02-08 ENCOUNTER — Other Ambulatory Visit (HOSPITAL_COMMUNITY): Payer: Self-pay

## 2021-02-18 DIAGNOSIS — E785 Hyperlipidemia, unspecified: Secondary | ICD-10-CM | POA: Diagnosis not present

## 2021-02-18 DIAGNOSIS — I5030 Unspecified diastolic (congestive) heart failure: Secondary | ICD-10-CM | POA: Diagnosis not present

## 2021-02-19 ENCOUNTER — Ambulatory Visit: Payer: Medicaid Other | Admitting: Internal Medicine

## 2021-02-24 DIAGNOSIS — E119 Type 2 diabetes mellitus without complications: Secondary | ICD-10-CM | POA: Diagnosis not present

## 2021-03-10 ENCOUNTER — Other Ambulatory Visit: Payer: Self-pay

## 2021-03-10 ENCOUNTER — Ambulatory Visit (INDEPENDENT_AMBULATORY_CARE_PROVIDER_SITE_OTHER): Payer: Medicaid Other | Admitting: Internal Medicine

## 2021-03-10 VITALS — BP 168/70 | HR 71 | Ht 65.0 in | Wt 242.6 lb

## 2021-03-10 DIAGNOSIS — Z794 Long term (current) use of insulin: Secondary | ICD-10-CM

## 2021-03-10 DIAGNOSIS — R809 Proteinuria, unspecified: Secondary | ICD-10-CM

## 2021-03-10 DIAGNOSIS — E1142 Type 2 diabetes mellitus with diabetic polyneuropathy: Secondary | ICD-10-CM | POA: Diagnosis not present

## 2021-03-10 DIAGNOSIS — E1129 Type 2 diabetes mellitus with other diabetic kidney complication: Secondary | ICD-10-CM | POA: Diagnosis not present

## 2021-03-10 DIAGNOSIS — E1165 Type 2 diabetes mellitus with hyperglycemia: Secondary | ICD-10-CM

## 2021-03-10 DIAGNOSIS — E1159 Type 2 diabetes mellitus with other circulatory complications: Secondary | ICD-10-CM | POA: Diagnosis not present

## 2021-03-10 LAB — POCT GLYCOSYLATED HEMOGLOBIN (HGB A1C): HbA1c POC (<> result, manual entry): >15 % (ref 4.0–5.6)

## 2021-03-10 LAB — GLUCOSE, POCT (MANUAL RESULT ENTRY): POC Glucose: 412 mg/dl — AB (ref 70–99)

## 2021-03-10 MED ORDER — METFORMIN HCL 500 MG PO TABS: 500.0000 mg | ORAL_TABLET | Freq: Two times a day (BID) | ORAL | 3 refills | Status: DC

## 2021-03-10 MED ORDER — NOVOLOG FLEXPEN 100 UNIT/ML ~~LOC~~ SOPN: 20.0000 [IU] | PEN_INJECTOR | Freq: Three times a day (TID) | SUBCUTANEOUS | 3 refills | Status: DC

## 2021-03-10 MED ORDER — RYBELSUS 14 MG PO TABS: 14.0000 mg | ORAL_TABLET | Freq: Every day | ORAL | 3 refills | Status: DC

## 2021-03-10 MED ORDER — LANTUS SOLOSTAR 100 UNIT/ML ~~LOC~~ SOPN: 50.0000 [IU] | PEN_INJECTOR | Freq: Every day | SUBCUTANEOUS | 3 refills | Status: DC

## 2021-03-10 NOTE — Patient Instructions (Addendum)
-   Continue Lantus 50 units daily  - Increase Metformin 500 mg twice a day  - Increase  Novolog 20 units with each meal  - Increase Rybelsus 14 mg daily     HOW TO TREAT LOW BLOOD SUGARS (Blood sugar LESS THAN 70 MG/DL) Please follow the RULE OF 15 for the treatment of hypoglycemia treatment (when your (blood sugars are less than 70 mg/dL)   STEP 1: Take 15 grams of carbohydrates when your blood sugar is low, which includes:  3-4 GLUCOSE TABS  OR 3-4 OZ OF JUICE OR REGULAR SODA OR ONE TUBE OF GLUCOSE GEL    STEP 2: RECHECK blood sugar in 15 MINUTES STEP 3: If your blood sugar is still low at the 15 minute recheck --> then, go back to STEP 1 and treat AGAIN with another 15 grams of carbohydrates.

## 2021-03-10 NOTE — Progress Notes (Signed)
Name: Whitney Sloan  Age/ Sex: 65 y.o., female   MRN/ DOB: [id number], [birth year]     PCP: Pcp, No   Reason for Endocrinology Evaluation: Type 2 Diabetes Mellitus  Initial Endocrine Consultative Visit: 09/10/2018    PATIENT IDENTIFIER: Whitney Sloan is a 65 y.o. female with a past medical history of HTN, T2DM and Dyslipidemia. The patient has followed with Endocrinology clinic since 09/10/2018 for consultative assistance with management of her diabetes.  DIABETIC HISTORY:  Whitney Sloan was diagnosed with T2DM in 2008, she has been on Metformin, Glipizide, Januvia and Actos in the past.She has been on insulin therapy for years. Her hemoglobin A1c has ranged from 8.0% in 2011, peaking at 12.5% in 2010.  On her initial visit to our clinic her A1c was 11.0% , she was on lantus, Invokana, Metfomin and Pioglitazone but she was not taking them. Bydureon was added.   Invokana was stopped by the patient by 06/2019 due to recurrent yeast infections Prandial insulin added 07/2019  SUBJECTIVE:   During the last visit (10/2020): A1c 13.0% .  Adjusted MDi regimen and continued metformin and started Rybelsus     Today (03/10/2021): Whitney Sloan is here for a follow up on diabetes..  She has been checking glucose sporadically  . The patient has not had hypoglycemic episodes since the last clinic visit.   She has been hospitalized in 12/2020 due to COVID , was on prednisone    Denies nausea , vomiting or diarrhea  She has not gotten her CGM yet    HOME DIABETES REGIMEN:  Lantus 40 units daily - takes 50 units  Novolog 18 units with each meal - takes 16 units  Metformin 1000 mg BID - has been taking 1 tablet daily  Rybelsus 7 mg daily      METER DOWNLOAD SUMMARY: did not bring      HISTORY:  Past Medical History:  Past Medical History:  Diagnosis Date   Abnormal stress test 12/31/2019   medium defect of moderate severity present in the basal inferior, mid inferior and apex location.   Findings consistent with ischemia   Chronic diastolic (congestive) heart failure (HCC)    Diabetes mellitus    Hyperlipemia    Hypertension    Psoriasis    Past Surgical History:  Past Surgical History:  Procedure Laterality Date   ANAL FISSURE REPAIR     ENDOMETRIAL ABLATION     Social History:  reports that she has never smoked. She has never used smokeless tobacco. She reports that she does not drink alcohol and does not use drugs. Family History:  Family History  Problem Relation Age of Onset   Dementia Mother    Renal Disease Father    Diabetes Other      HOME MEDICATIONS: Allergies as of 03/10/2021       Reactions   Oxycodone-acetaminophen Nausea And Vomiting        Medication List        Accurate as of March 10, 2021  3:00 PM. If you have any questions, ask your nurse or doctor.          STOP taking these medications    nitroGLYCERIN 0.4 MG SL tablet Commonly known as: NITROSTAT Stopped by: Scarlette Shorts, MD       TAKE these medications    Aspir-Low 81 MG EC tablet Generic drug: aspirin Take 81 mg by mouth every morning.   atorvastatin 20 MG tablet Commonly known as:  LIPITOR Take 20 mg by mouth every evening.   carvedilol 25 MG tablet Commonly known as: Coreg Take 1 tablet (25 mg total) by mouth 2 (two) times daily.   clotrimazole 1 % vaginal cream Commonly known as: GYNE-LOTRIMIN Place 1 Applicatorful vaginally daily as needed (yeast).   Dexcom G6 Receiver Devi 1 Device by Does not apply route as directed.   Dexcom G6 Sensor Misc 1 Device by Does not apply route as directed.   Dexcom G6 Transmitter Misc 1 Device by Does not apply route as directed.   Diclofenac Sodium 3 % Gel Apply 2 g topically 2 (two) times daily as needed.   Dilt-XR 240 MG 24 hr capsule Generic drug: diltiazem Take 240 mg by mouth daily.   Ex-Lax 15 MG Tabs Generic drug: Sennosides Take 15 mg by mouth daily as needed (constipation).    ezetimibe 10 MG tablet Commonly known as: ZETIA Take 10 mg by mouth at bedtime.   fluconazole 150 MG tablet Commonly known as: Diflucan Take 1 tablet (150 mg total) by mouth daily. -For your yeast infection, start the Diflucan (fluconazole)- Take one pill today (day 1). If you're still having symptoms in 3 days, take the second pill.   furosemide 80 MG tablet Commonly known as: LASIX Take 1 tablet (80 mg total) by mouth 2 (two) times daily.   ibuprofen 200 MG tablet Commonly known as: ADVIL Take 400 mg by mouth every 4 (four) hours as needed for fever, headache or moderate pain.   Insulin Pen Needle 32G X 4 MM Misc 1 Device by Does not apply route as directed.   Lantus SoloStar 100 UNIT/ML Solostar Pen Generic drug: insulin glargine Inject 40 Units into the skin daily.   losartan 100 MG tablet Commonly known as: COZAAR Take 1 tablet (100 mg total) by mouth daily.   magnesium oxide 400 MG tablet Commonly known as: MAG-OX Take 1 tablet (400 mg total) by mouth daily.   metFORMIN 500 MG tablet Commonly known as: GLUCOPHAGE Take 500 mg by mouth daily with breakfast.   metoprolol succinate 50 MG 24 hr tablet Commonly known as: TOPROL-XL Take 50 mg by mouth daily.   NovoLOG FlexPen 100 UNIT/ML FlexPen Generic drug: insulin aspart Inject 18 Units into the skin 3 (three) times daily with meals.   nystatin powder Commonly known as: MYCOSTATIN/NYSTOP Apply 1 application topically daily as needed (yeast).   polyethylene glycol 17 g packet Commonly known as: MIRALAX / GLYCOLAX Take 17 g by mouth daily.   potassium chloride SA 20 MEQ tablet Commonly known as: KLOR-CON Take 1 tablet (20 mEq total) by mouth daily.   predniSONE 10 MG tablet Commonly known as: DELTASONE take 4 tabs by mouth on day 1, then 3 tabs on day two, 2 tabs on day three, 1 tabs on day four,   then STOP.   Rybelsus 3 MG Tabs Generic drug: Semaglutide Take 3 mg by mouth daily before breakfast.    Rybelsus 7 MG Tabs Generic drug: Semaglutide Take 7 mg by mouth daily before breakfast.   spironolactone 25 MG tablet Commonly known as: Aldactone Take 1 tablet (25 mg total) by mouth daily.   tamsulosin 0.4 MG Caps capsule Commonly known as: FLOMAX Take 0.4 mg by mouth daily.   VITAMIN D PO Take 1 capsule by mouth daily.         OBJECTIVE:   Vital Signs: BP (!) 168/70 (BP Location: Right Arm, Patient Position: Sitting, Cuff Size: Large)   Pulse  71   Ht 5\' 5"  (1.651 m)   Wt 242 lb 9.6 oz (110 kg)   SpO2 97%   BMI 40.37 kg/m   Wt Readings from Last 3 Encounters:  03/10/21 242 lb 9.6 oz (110 kg)  12/18/20 245 lb 1.6 oz (111.2 kg)  10/14/20 249 lb 2 oz (113 kg)     Exam: General: Pt appears well and is in NAD  Lungs: CTA   Heart: RRR  Extremities: Trace  pretibial edema.  Neuro: MS is good with appropriate affect, pt is alert and Ox3    DM Foot Exam 03/10/2021 The skin of the feet is without sores or ulcerations. The pedal pulses 1+ B/L The sensation is decreased to a screening 5.07, 10 gram monofilament bilaterally       DATA REVIEWED:  Lab Results  Component Value Date   HGBA1C >15.0 03/10/2021   HGBA1C 13.0 (A) 10/14/2020   HGBA1C 8.7 (A) 03/26/2020            ASSESSMENT / PLAN / RECOMMENDATIONS:   1) Type 2 Diabetes Mellitus,Poorly controlled, With neuropathic complications and microalbuminuria- Most recent A1c of 13.0 %. Goal A1c < 7.0 %.    - A1c Up from 8.7% , this is due to medication non adherence.  - We again discussed risk of retinopathy, neuropathy, CKD and amputations. - Major barriers to self care is memory issues and other social determinants -She has been taking more Lantus than previously prescribed I will continue this due to lack of glucose data -I have attempted to prescribe Dexcom in the past but for some reason she is not able to get it -She tells me she is out of the NovoLog for the past 3 weeks because the pharmacy is not  able to refill it, we contacted the pharmacy and they have NovoLog waiting, and she has not picked up Rybelsus prescription since May 2022   MEDICATIONS: -Continue Lantus at 50 units daily   -Increase metformin 500 mg twice daily -Increase NovoLog to 20 units with each meal -Increase Rybelsus 14  mg daily   EDUCATION / INSTRUCTIONS: BG monitoring instructions: Patient is instructed to check her blood sugars 3 times a day, before meals Call [facility] if: BG persistently < 70  I reviewed the Rule of 15 for the treatment of hypoglycemia in detail with the patient. Literature supplied.   2) Microalbuminuria   -We will emphasize the importance of glycemic control.  I have encouraged her to make sure that she is on losartan 100 mg daily    Medication  Continue losartan 100 mg    F/U in 4 months     Signed electronically by: June 2022, MD  [facility] Endocrinology  [facility] [address] [place] [place], [place] Phone: [phone] FAX: [phone]   CC: Pcp, No No address on file Phone: None  Fax: None  Return to Endocrinology clinic as below: Future Appointments  Date Time Provider Department Center  03/24/2021  1:00 PM 05/24/2021, NP [facility] None

## 2021-03-20 NOTE — Progress Notes (Signed)
Subjective:    Whitney Sloan - 65 y.o. female MRN [id number]  Date of birth: 1956/02/18  HPI  Whitney Sloan is to establish care.  Current issues and/or concerns: Reports followed by Endocrinology for diabetes management. Reports within the last 1 week began Rybelsus. Reports two vomiting episodes. One occurrence 4 days ago and then again 2 days ago. Reports had some [place] food which may have caused symptoms. Also, concerned if Rybelsus caused this since it is a new medication. Neuropathy in feet and interested in medication. Next appointment with Endocrinology December 2022. Reports concern of taking too many medications. Reports she is also followed by Cardiology for heart failure and has appointment with the same on next week. Reports did not take blood pressure today as of present.   ROS per HPI    Health Maintenance: Health Maintenance Due  Topic Date Due   COVID-19 Vaccine (1) Never done   OPHTHALMOLOGY EXAM  Never done   Hepatitis C Screening  Never done   Zoster Vaccines- Shingrix (1 of 2) Never done   PAP SMEAR-Modifier  Never done   COLONOSCOPY (Pts 45-54yrs Insurance coverage will need to be confirmed)  Never done   MAMMOGRAM  11/15/2015   TETANUS/TDAP  08/07/2019   DEXA SCAN  Never done   FOOT EXAM  11/20/2020     Past Medical History: Patient Active Problem List   Diagnosis Date Noted   COVID-19 virus infection 12/13/2020   Type 2 diabetes mellitus with microalbuminuria, with long-term current use of insulin (HCC) 10/15/2020   Right knee pain 04/10/2020   Chest pain of uncertain etiology 12/19/2019   CAP (community acquired pneumonia) 12/19/2019   Constipation 12/10/2019   Cholelithiasis 12/10/2019   Thyroid nodule 12/10/2019   Obesity (BMI 30-39.9) 12/10/2019   Acute on chronic combined systolic and diastolic CHF (congestive heart failure) (HCC) 11/08/2019   Acute respiratory failure with hypoxia (HCC) 11/08/2019   Hypertensive urgency 11/08/2019    Mixed diabetic hyperlipidemia associated with type 2 diabetes mellitus (HCC) 11/08/2019   Type 2 diabetes mellitus with diabetic polyneuropathy, with long-term current use of insulin (HCC) 08/08/2019   Dyslipidemia 03/23/2010   DECREASED HEARING, BILATERAL 02/05/2010   MEMORY LOSS 02/05/2010   INCONTINENCE 02/05/2010   NEOPLASM, MALIGNANT, BREAST, HX OF 01/13/2010   ANXIETY STATE, UNSPECIFIED 12/02/2009   UNSTEADY GAIT 12/02/2009   TOE PAIN 09/24/2009   ANAL OR RECTAL PAIN 08/25/2009   LEG EDEMA, BILATERAL 08/25/2009   Uncontrolled type 2 diabetes mellitus with hyperglycemia, with long-term current use of insulin (HCC) 08/06/2009   Essential hypertension 08/06/2009   DERMATITIS, ATOPIC 08/06/2009    Social History   reports that she has never smoked. She has never used smokeless tobacco. She reports that she does not drink alcohol and does not use drugs.   Family History  family history includes Dementia in her mother; Diabetes in an other family member; Renal Disease in her father.   Medications: reviewed and updated   Objective:   Physical Exam BP (!) 155/76 (BP Location: Right Arm, Patient Position: Sitting, Cuff Size: Large)   Pulse 80   Temp 98 F (36.7 C) (Oral)   Resp 16   Ht 5' 3.5" (1.613 m)   Wt 234 lb (106.1 kg)   SpO2 96%   BMI 40.80 kg/m   Physical Exam HENT:     Head: Normocephalic and atraumatic.  Eyes:     Extraocular Movements: Extraocular movements intact.     Conjunctiva/sclera: Conjunctivae normal.  Pupils: Pupils are equal, round, and reactive to light.  Cardiovascular:     Rate and Rhythm: Normal rate and regular rhythm.     Pulses: Normal pulses.     Heart sounds: Normal heart sounds.  Pulmonary:     Effort: Pulmonary effort is normal.     Breath sounds: Normal breath sounds.  Musculoskeletal:     Cervical back: Normal range of motion and neck supple.  Neurological:     General: No focal deficit present.     Mental Status: She is alert  and oriented to person, place, and time.  Psychiatric:        Mood and Affect: Mood normal.        Behavior: Behavior normal.     Assessment & Plan:  1. Encounter to establish care: - Patient presents today to establish care.  - Return for annual physical examination, labs, and health maintenance. Arrive fasting meaning having no food for at least 8 hours prior to appointment. You may have only water or black coffee. Please take scheduled medications as normal.  2. Type 2 diabetes mellitus with diabetic polyneuropathy, with long-term current use of insulin (HCC): - Encouraged to notify Endocrinology related to concerns of Semaglutide (Rybelsus). Patient agreeable.  - Keep all scheduled appointments with Endocrinology.   3. Acute on chronic combined systolic and diastolic CHF (congestive heart failure) (HCC): - Keep all scheduled appointments with Cardiology.    Patient was given clear instructions to go to Emergency Department or return to medical center if symptoms don't improve, worsen, or new problems develop.The patient verbalized understanding.  I discussed the assessment and treatment plan with the patient. The patient was provided an opportunity to ask questions and all were answered. The patient agreed with the plan and demonstrated an understanding of the instructions.   The patient was advised to call back or seek an in-person evaluation if the symptoms worsen or if the condition fails to improve as anticipated.    Ricky Stabs, NP 03/24/2021, 4:16 PM Primary Care at [facility]

## 2021-03-24 ENCOUNTER — Encounter: Payer: Self-pay | Admitting: Family

## 2021-03-24 ENCOUNTER — Ambulatory Visit (INDEPENDENT_AMBULATORY_CARE_PROVIDER_SITE_OTHER): Payer: Medicaid Other | Admitting: Family

## 2021-03-24 ENCOUNTER — Telehealth: Payer: Self-pay | Admitting: Pharmacy Technician

## 2021-03-24 ENCOUNTER — Other Ambulatory Visit: Payer: Self-pay

## 2021-03-24 VITALS — BP 155/76 | HR 80 | Temp 98.0°F | Resp 16 | Ht 63.5 in | Wt 234.0 lb

## 2021-03-24 DIAGNOSIS — Z794 Long term (current) use of insulin: Secondary | ICD-10-CM

## 2021-03-24 DIAGNOSIS — Z7689 Persons encountering health services in other specified circumstances: Secondary | ICD-10-CM

## 2021-03-24 DIAGNOSIS — E1142 Type 2 diabetes mellitus with diabetic polyneuropathy: Secondary | ICD-10-CM

## 2021-03-24 DIAGNOSIS — I5043 Acute on chronic combined systolic (congestive) and diastolic (congestive) heart failure: Secondary | ICD-10-CM | POA: Diagnosis not present

## 2021-03-24 NOTE — Patient Instructions (Signed)

## 2021-03-24 NOTE — Telephone Encounter (Signed)
Received notification from [employer] regarding a prior authorization for [facility] GD SENSOR. Authorization has been APPROVED from 10.12.22 to 4.12.23.     Authorization # [id number]

## 2021-03-24 NOTE — Progress Notes (Signed)
Establish care Medication question regarding rybelsus Neuropathy in feet

## 2021-03-24 NOTE — Telephone Encounter (Signed)
Patient Advocate Encounter   Received notification from [employer] that prior authorization for [facility] G6 SENSOR is required.   PA submitted on 10.12.22 Key [id number] Status is pending    [facility] will continue to follow   Montez Morita, CPhT Patient Advocate [facility] Phone: [phone] Fax:  [phone]

## 2021-04-13 IMAGING — DX DG CHEST 1V PORT
1 series · 1 of 1 positions shown · non-contrast
Comparison: 11/08/2019 chest radiograph.

CLINICAL DATA: Dyspnea

EXAM:
PORTABLE CHEST 1 VIEW

[chest ap]
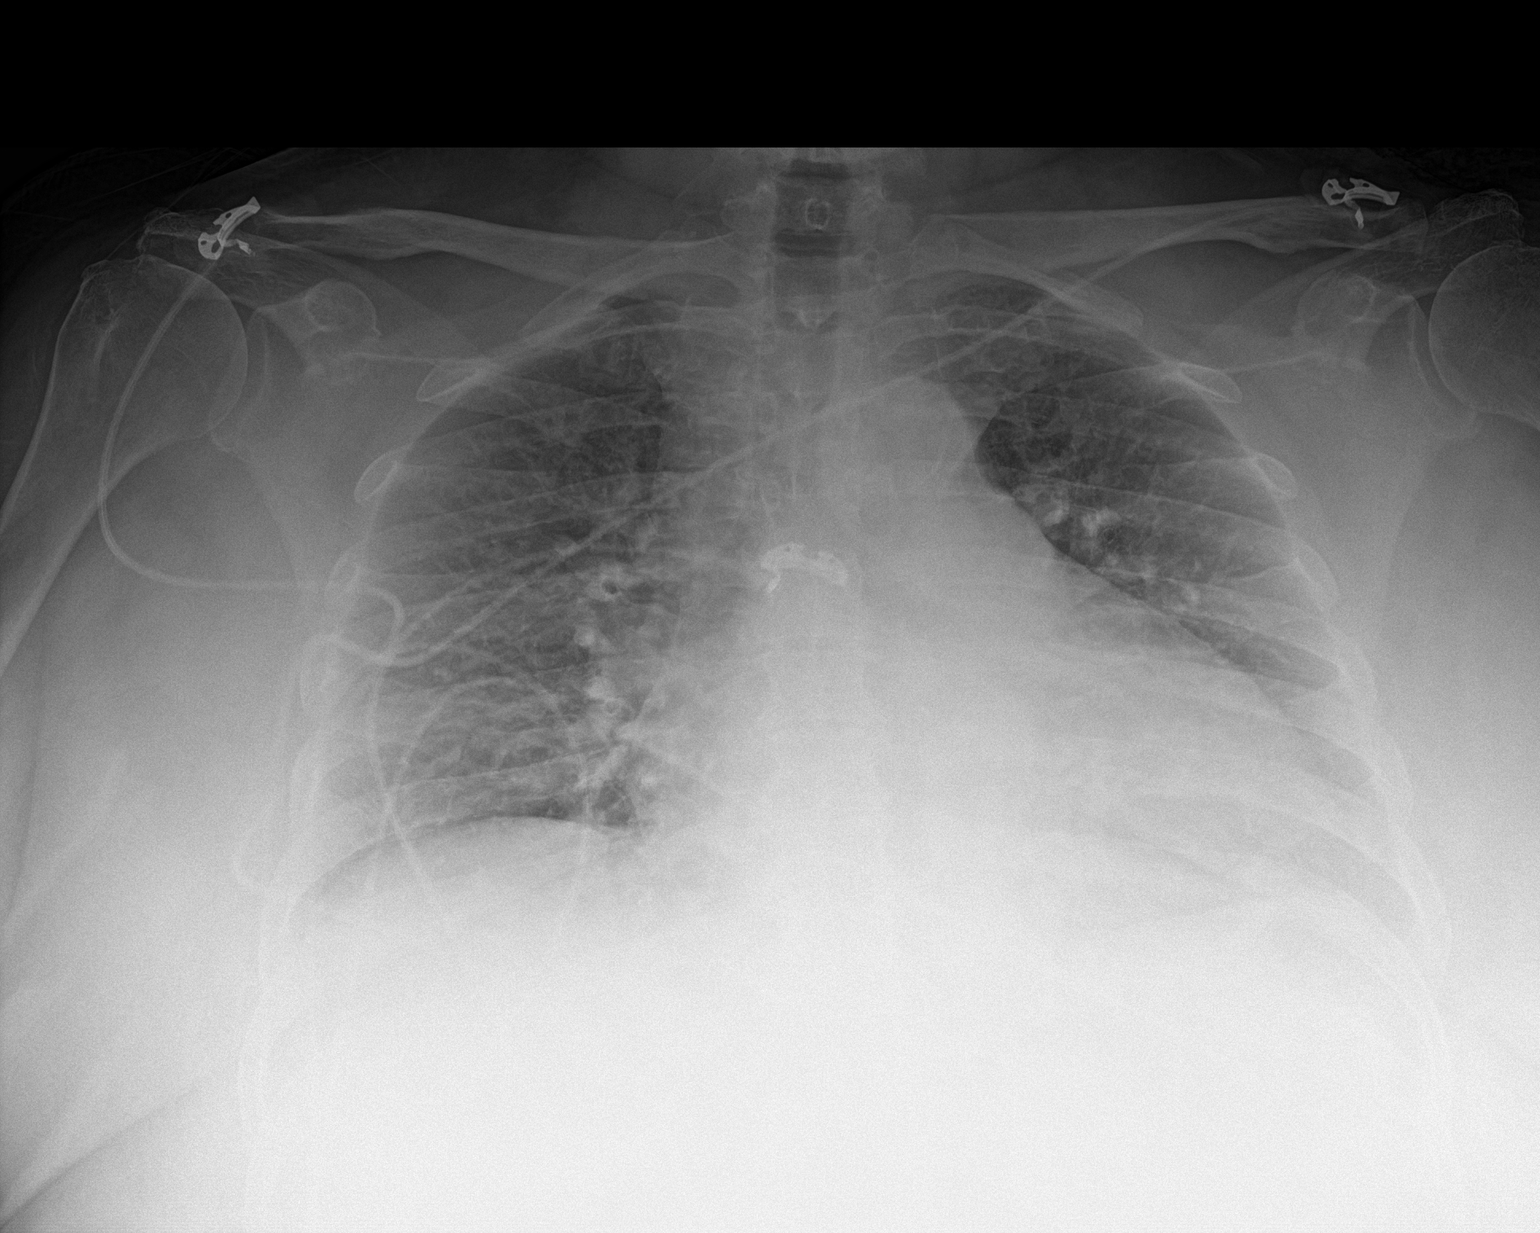

[1 of 1 positions shown; findings below may reference images not displayed]

FINDINGS: Stable cardiomediastinal silhouette with mild cardiomegaly. No
pneumothorax. No significant pleural effusions. Mild pulmonary
edema, similar.
IMPRESSION: Stable mild congestive heart failure.

## 2021-05-07 NOTE — Progress Notes (Deleted)
Patient ID: Whitney Sloan, female    DOB: [birth year]  MRN: [id number]  CC: Annual Physical Exam   Subjective: Whitney Sloan is a 65 y.o. female who presents for annual physical exam.   Her concerns today include:   NEED PAP  NEED MAMMO  NEED COLONOSCOPY NEED DM EYE EXAM NEED DM FOOT EXAM   NEED FLU  NEED SHINGLES   FOLLOWED BY ENDO  FOLLOWED BY CARDS, DID YOU GET APPT?  Patient Active Problem List   Diagnosis Date Noted   COVID-19 virus infection 12/13/2020   Type 2 diabetes mellitus with microalbuminuria, with long-term current use of insulin (HCC) 10/15/2020   Right knee pain 04/10/2020   Chest pain of uncertain etiology 12/19/2019   CAP (community acquired pneumonia) 12/19/2019   Constipation 12/10/2019   Cholelithiasis 12/10/2019   Thyroid nodule 12/10/2019   Obesity (BMI 30-39.9) 12/10/2019   Acute on chronic combined systolic and diastolic CHF (congestive heart failure) (HCC) 11/08/2019   Acute respiratory failure with hypoxia (HCC) 11/08/2019   Hypertensive urgency 11/08/2019   Mixed diabetic hyperlipidemia associated with type 2 diabetes mellitus (HCC) 11/08/2019   Type 2 diabetes mellitus with diabetic polyneuropathy, with long-term current use of insulin (HCC) 08/08/2019   Dyslipidemia 03/23/2010   DECREASED HEARING, BILATERAL 02/05/2010   MEMORY LOSS 02/05/2010   INCONTINENCE 02/05/2010   NEOPLASM, MALIGNANT, BREAST, HX OF 01/13/2010   ANXIETY STATE, UNSPECIFIED 12/02/2009   UNSTEADY GAIT 12/02/2009   TOE PAIN 09/24/2009   ANAL OR RECTAL PAIN 08/25/2009   LEG EDEMA, BILATERAL 08/25/2009   Uncontrolled type 2 diabetes mellitus with hyperglycemia, with long-term current use of insulin (HCC) 08/06/2009   Essential hypertension 08/06/2009   DERMATITIS, ATOPIC 08/06/2009     Current Outpatient Medications on File Prior to Visit  Medication Sig Dispense Refill   aspirin (ASPIR-LOW) 81 MG EC tablet Take 81 mg by mouth every morning.     atorvastatin  (LIPITOR) 20 MG tablet Take 20 mg by mouth every evening.     carvedilol (COREG) 25 MG tablet Take 1 tablet (25 mg total) by mouth 2 (two) times daily. 60 tablet 0   Cholecalciferol (VITAMIN D PO) Take 1 capsule by mouth daily.     clotrimazole (GYNE-LOTRIMIN) 1 % vaginal cream Place 1 Applicatorful vaginally daily as needed (yeast).     Continuous Blood Gluc Receiver (DEXCOM G6 RECEIVER) DEVI 1 Device by Does not apply route as directed. 1 each 0   Continuous Blood Gluc Sensor (DEXCOM G6 SENSOR) MISC 1 Device by Does not apply route as directed. 9 each 3   Continuous Blood Gluc Transmit (DEXCOM G6 TRANSMITTER) MISC 1 Device by Does not apply route as directed. 1 each 3   Diclofenac Sodium 3 % GEL Apply 2 g topically 2 (two) times daily as needed. 100 g 1   DILT-XR 240 MG 24 hr capsule Take 240 mg by mouth daily.     ezetimibe (ZETIA) 10 MG tablet Take 10 mg by mouth at bedtime.     furosemide (LASIX) 80 MG tablet Take 1 tablet (80 mg total) by mouth 2 (two) times daily. 60 tablet 0   ibuprofen (ADVIL) 200 MG tablet Take 400 mg by mouth every 4 (four) hours as needed for fever, headache or moderate pain.      insulin aspart (NOVOLOG FLEXPEN) 100 UNIT/ML FlexPen Inject 20 Units into the skin 3 (three) times daily with meals. 45 mL 3   insulin glargine (LANTUS SOLOSTAR) 100 UNIT/ML Solostar  Pen Inject 50 Units into the skin daily. 45 mL 3   Insulin Pen Needle 32G X 4 MM MISC 1 Device by Does not apply route as directed. 400 each 3   losartan (COZAAR) 100 MG tablet Take 1 tablet (100 mg total) by mouth daily. 90 tablet 3   magnesium oxide (MAG-OX) 400 MG tablet Take 1 tablet (400 mg total) by mouth daily. 30 tablet 3   metFORMIN (GLUCOPHAGE) 500 MG tablet Take 1 tablet (500 mg total) by mouth 2 (two) times daily with a meal. 180 tablet 3   metoprolol succinate (TOPROL-XL) 50 MG 24 hr tablet Take 50 mg by mouth daily.     nystatin (MYCOSTATIN/NYSTOP) powder Apply 1 application topically daily as  needed (yeast).     polyethylene glycol (MIRALAX / GLYCOLAX) 17 g packet Take 17 g by mouth daily.     potassium chloride SA (KLOR-CON) 20 MEQ tablet Take 1 tablet (20 mEq total) by mouth daily. 30 tablet 0   Semaglutide (RYBELSUS) 14 MG TABS Take 14 mg by mouth daily. 90 tablet 3   Sennosides (EX-LAX) 15 MG TABS Take 15 mg by mouth daily as needed (constipation).     spironolactone (ALDACTONE) 25 MG tablet Take 1 tablet (25 mg total) by mouth daily. 30 tablet 0   tamsulosin (FLOMAX) 0.4 MG CAPS capsule Take 0.4 mg by mouth daily.     No current facility-administered medications on file prior to visit.    Allergies  Allergen Reactions   Oxycodone-Acetaminophen Nausea And Vomiting    Social History   Socioeconomic History   Marital status: Divorced    Spouse name: Not on file   Number of children: Not on file   Years of education: Not on file   Highest education level: Not on file  Occupational History   Occupation: disabled  Tobacco Use   Smoking status: Never   Smokeless tobacco: Never  Substance and Sexual Activity   Alcohol use: No   Drug use: No   Sexual activity: Not on file  Other Topics Concern   Not on file  Social History Narrative   Not on file   Social Determinants of Health   Financial Resource Strain: Not on file  Food Insecurity: Not on file  Transportation Needs: Not on file  Physical Activity: Not on file  Stress: Not on file  Social Connections: Not on file  Intimate Partner Violence: Not on file    Family History  Problem Relation Age of Onset   Dementia Mother    Renal Disease Father    Diabetes Other     Past Surgical History:  Procedure Laterality Date   ANAL FISSURE REPAIR     ENDOMETRIAL ABLATION      ROS: Review of Systems Negative except as stated above  PHYSICAL EXAM: There were no vitals taken for this visit.  Physical Exam  {female adult master:310786} {female adult master:310785}  CMP Latest Ref Rng & Units 12/18/2020  12/17/2020 12/16/2020  Glucose 70 - 99 mg/dL 409(W) 119(J) 478(G)  BUN 8 - 23 mg/dL 95(A) 21(H) 08(M)  Creatinine 0.44 - 1.00 mg/dL 5.78 4.69(G) 2.95(M)  Sodium 135 - 145 mmol/L 140 137 136  Potassium 3.5 - 5.1 mmol/L 3.0(L) 3.0(L) 3.3(L)  Chloride 98 - 111 mmol/L 96(L) 96(L) 95(L)  CO2 22 - 32 mmol/L 34(H) 34(H) 30  Calcium 8.9 - 10.3 mg/dL 8.4(X) 8.3(L) 8.5(L)  Total Protein 6.5 - 8.1 g/dL - 6.3(L) 6.2(L)  Total Bilirubin 0.3 -  1.2 mg/dL - 0.9 0.4  Alkaline Phos 38 - 126 U/L - 59 61  AST 15 - 41 U/L - 18 19  ALT 0 - 44 U/L - 15 16   Lipid Panel     Component Value Date/Time   CHOL 168 11/13/2019 0218   TRIG 97 11/13/2019 0218   HDL 48 11/13/2019 0218   CHOLHDL 3.5 11/13/2019 0218   VLDL 19 11/13/2019 0218   LDLCALC 101 (H) 11/13/2019 0218    CBC    Component Value Date/Time   WBC 12.3 (H) 12/18/2020 0505   RBC 5.05 12/18/2020 0505   HGB 13.9 12/18/2020 0505   HGB 11.3 01/02/2020 1022   HCT 42.4 12/18/2020 0505   HCT 35.3 01/02/2020 1022   PLT 233 12/18/2020 0505   PLT 275 01/02/2020 1022   MCV 84.0 12/18/2020 0505   MCV 80 01/02/2020 1022   MCH 27.5 12/18/2020 0505   MCHC 32.8 12/18/2020 0505   RDW 15.4 12/18/2020 0505   RDW 14.9 01/02/2020 1022   LYMPHSABS 3.9 12/18/2020 0505   MONOABS 1.0 12/18/2020 0505   EOSABS 0.2 12/18/2020 0505   BASOSABS 0.0 12/18/2020 0505    ASSESSMENT AND PLAN:  There are no diagnoses linked to this encounter.   Patient was given the opportunity to ask questions.  Patient verbalized understanding of the plan and was able to repeat key elements of the plan. Patient was given clear instructions to go to Emergency Department or return to medical center if symptoms don't improve, worsen, or new problems develop.The patient verbalized understanding.   No orders of the defined types were placed in this encounter.    Requested Prescriptions    No prescriptions requested or ordered in this encounter    No follow-ups on file.  Rema Fendt, NP

## 2021-05-12 ENCOUNTER — Encounter: Payer: Medicaid Other | Admitting: Family

## 2021-05-12 DIAGNOSIS — Z130 Encounter for screening for diseases of the blood and blood-forming organs and certain disorders involving the immune mechanism: Secondary | ICD-10-CM

## 2021-05-12 DIAGNOSIS — Z113 Encounter for screening for infections with a predominantly sexual mode of transmission: Secondary | ICD-10-CM

## 2021-05-12 DIAGNOSIS — E1142 Type 2 diabetes mellitus with diabetic polyneuropathy: Secondary | ICD-10-CM

## 2021-05-12 DIAGNOSIS — Z124 Encounter for screening for malignant neoplasm of cervix: Secondary | ICD-10-CM

## 2021-05-12 DIAGNOSIS — Z0000 Encounter for general adult medical examination without abnormal findings: Secondary | ICD-10-CM

## 2021-05-12 DIAGNOSIS — Z13820 Encounter for screening for osteoporosis: Secondary | ICD-10-CM

## 2021-05-12 DIAGNOSIS — Z1159 Encounter for screening for other viral diseases: Secondary | ICD-10-CM

## 2021-05-12 DIAGNOSIS — Z1211 Encounter for screening for malignant neoplasm of colon: Secondary | ICD-10-CM

## 2021-05-12 DIAGNOSIS — Z1329 Encounter for screening for other suspected endocrine disorder: Secondary | ICD-10-CM

## 2021-05-12 DIAGNOSIS — Z1231 Encounter for screening mammogram for malignant neoplasm of breast: Secondary | ICD-10-CM

## 2021-05-12 DIAGNOSIS — Z13228 Encounter for screening for other metabolic disorders: Secondary | ICD-10-CM

## 2021-05-12 IMAGING — CT CT ANGIO CHEST
2 of 6 series · 18 of 36 positions shown · IV contrast (omnipaque)
Comparison: None.

CLINICAL DATA: Chest pain. Shortness of breath. History of CHF.
Positive D-dimer.

EXAM:
CT ANGIOGRAPHY CHEST WITH CONTRAST
TECHNIQUE: Multidetector CT imaging of the chest was performed using the
standard protocol during bolus administration of intravenous
contrast. Multiplanar CT image reconstructions and MIPs were
obtained to evaluate the vascular anatomy.
CONTRAST:  64mL OMNIPAQUE IOHEXOL 350 MG/ML SOLN

[Series 8: pe thins · axial · 0.62mm/px · z∈[-319,-52]mm · 17 of 202 slices shown]
[im 12/202  lung]
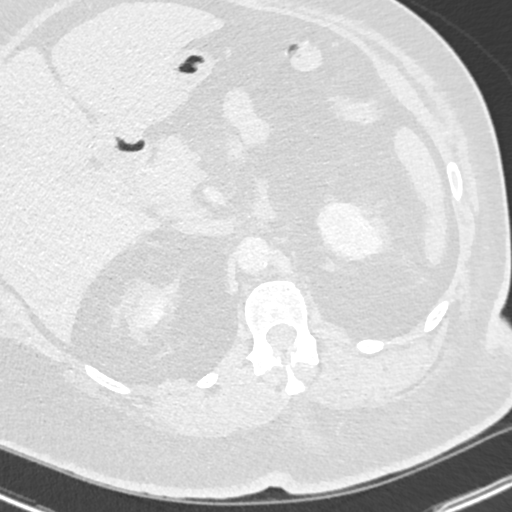
[im 23/202  mediastinal]
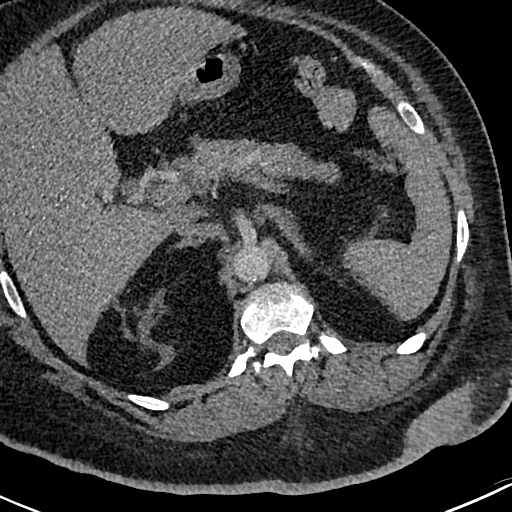
[im 34/202  lung]
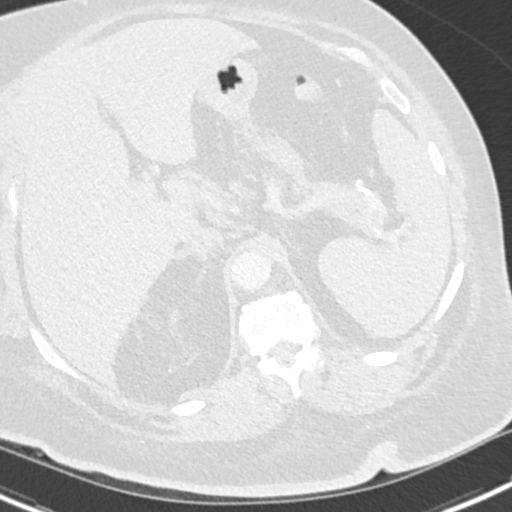
[im 45/202  mediastinal]
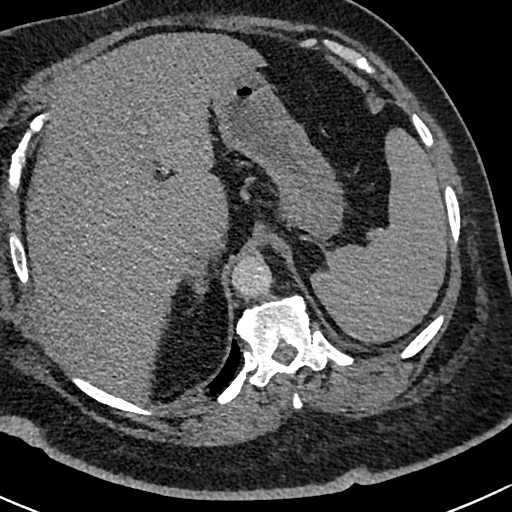
[im 56/202  lung]
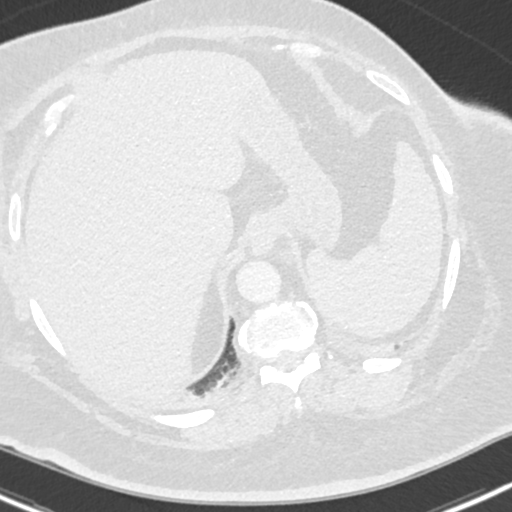
[im 68/202  mediastinal]
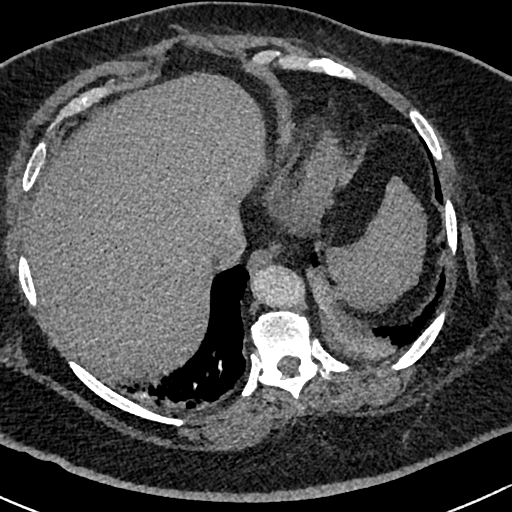
[im 79/202  lung]
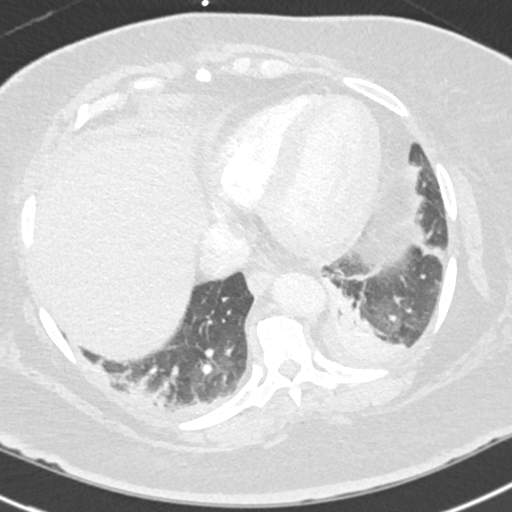
[im 90/202  mediastinal]
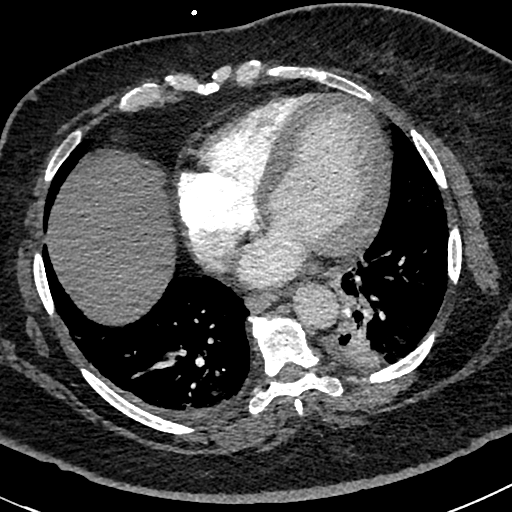
[im 101/202  lung]
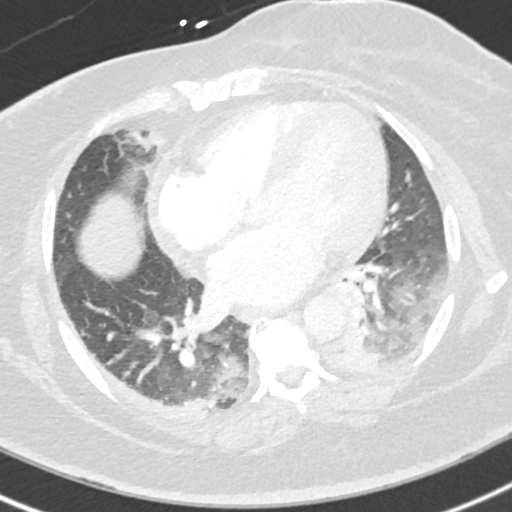
[im 112/202  mediastinal]
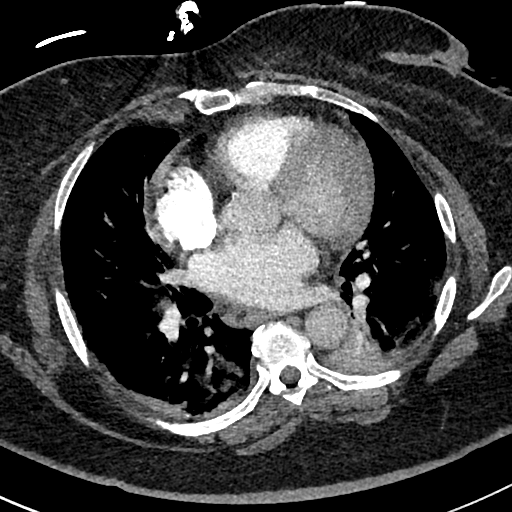
[im 123/202  lung]
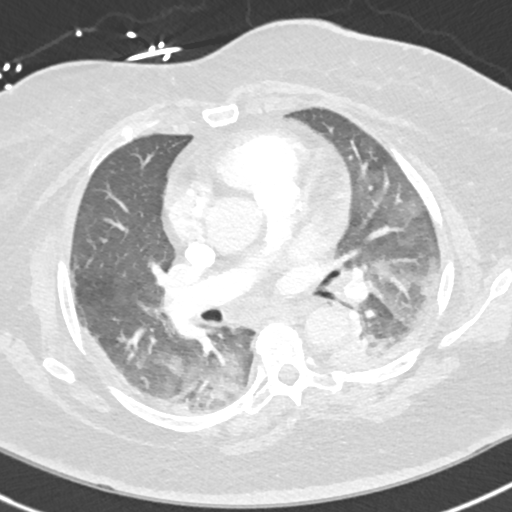
[im 135/202  mediastinal]
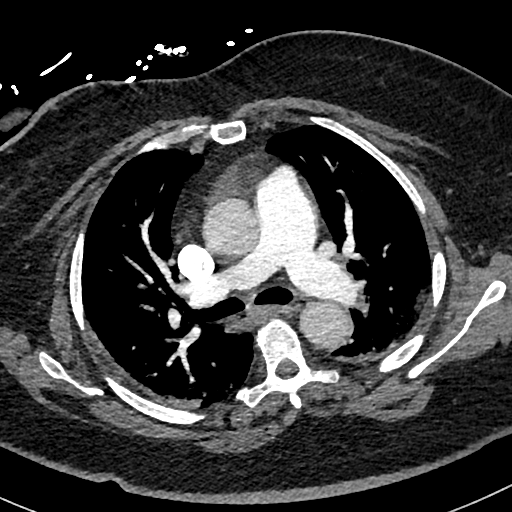
[im 146/202  lung]
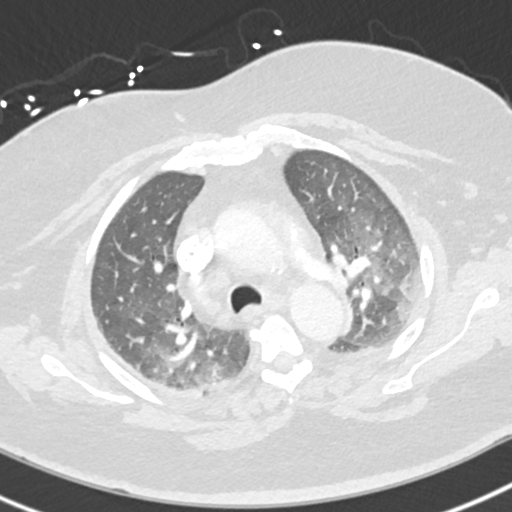
[im 157/202  mediastinal]
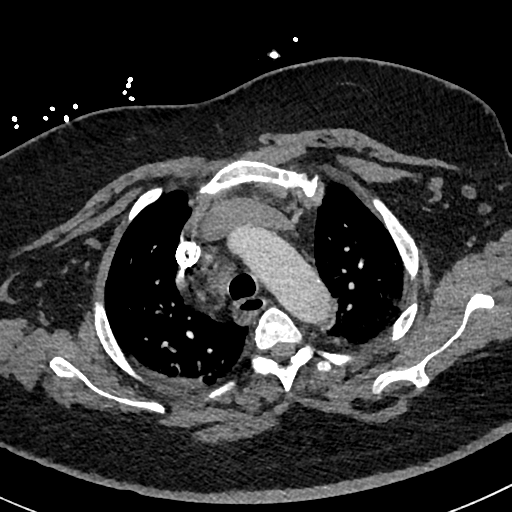
[im 168/202  lung]
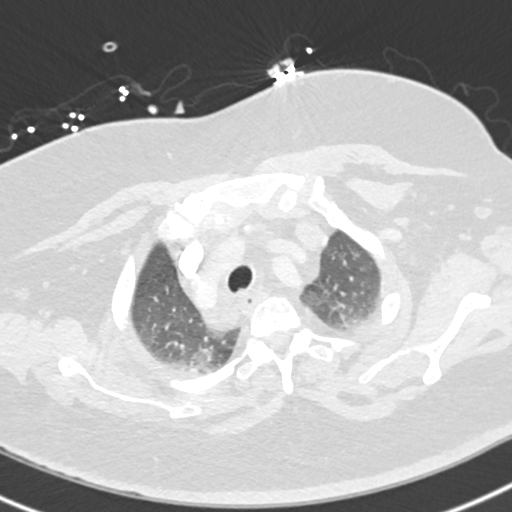
[im 179/202  mediastinal]
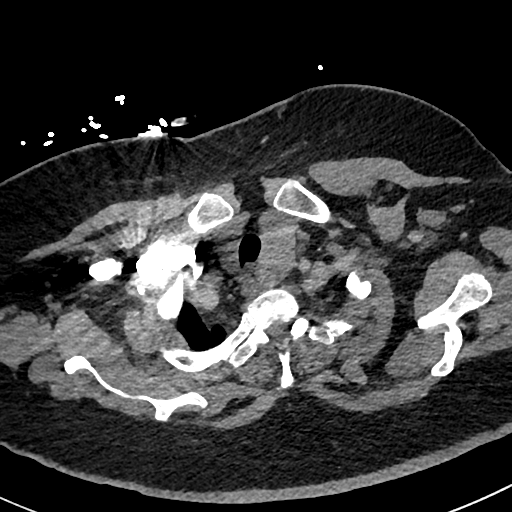
[im 190/202  lung]
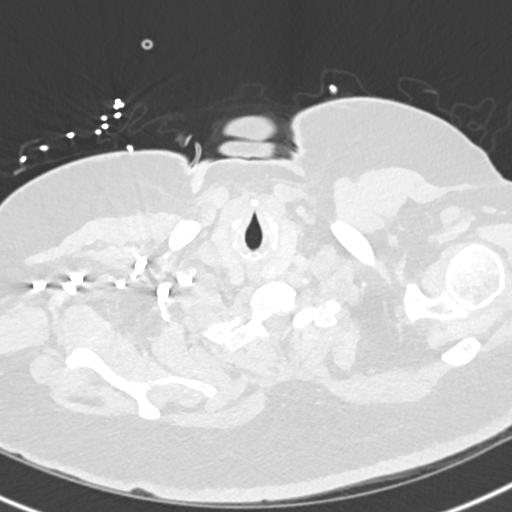

[Series 9: pe 2mm cor · coronal · 0.59mm/px · 1 of 101 slices shown]
[im 51/101  mediastinal]
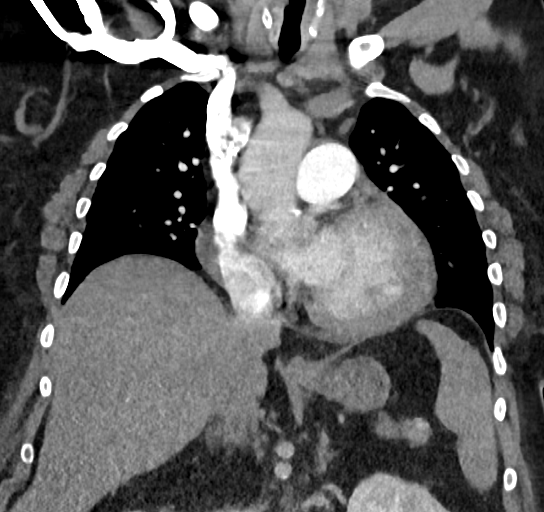

[18 of 36 positions shown; findings below may reference images not displayed]

FINDINGS: Cardiovascular: There is no evidence for an acute pulmonary
embolism. The main pulmonary artery is dilated measuring
approximately 3.3 cm. There is cardiomegaly with coronary artery
disease. There is a trace pericardial effusion. There are
atherosclerotic changes of the thoracic aorta without evidence for
an aneurysm or dissection.

Mediastinum/Nodes:

--there are enlarged mediastinal lymph nodes. For example there is a
right paratracheal lymph node measuring up to approximately 1.2 cm
(axial series 8, image 43).

--there are mildly enlarged mediastinal lymph nodes, right greater
than left.

-- No axillary lymphadenopathy.

-- No supraclavicular lymphadenopathy.

--there is asymmetric enlargement of the left thyroid gland with
suggestion of a inferior left thyroid nodule measuring approximately
2.1 cm (axial series 8, image 25).

-  Unremarkable esophagus.

Lungs/Pleura: There is a mosaic appearance of the lung parenchyma
bilaterally. There is segmental atelectasis in the left lower lobe.
There is consolidation in the medial right lower lobe. There is no
pneumothorax or significant pleural effusion. The trachea is
unremarkable.

Upper Abdomen: Contrast bolus timing is not optimized for evaluation
of the abdominal organs. The visualized portions of the organs of
the upper abdomen are normal.

Musculoskeletal: No chest wall abnormality. No bony spinal canal
stenosis.

Review of the MIP images confirms the above findings.
IMPRESSION: 1. There is no evidence for an acute pulmonary embolism.
2. There is consolidation in the medial right lower lobe, concerning
for pneumonia. There is segmental atelectasis in the left lower
lobe.
3. There is a mosaic appearance of the lung parenchyma bilaterally,
which can be seen in patients with small airways disease.
4. Cardiomegaly with coronary artery disease.
5. Dilated main pulmonary artery which can be seen in patients with
elevated pulmonary artery pressures.
6. Enlarged mediastinal lymph nodes, likely reactive.
7. Asymmetric enlargement of the left thyroid gland with suggestion
of a inferior left thyroid nodule measuring approximately 2.1 cm.
Recommend outpatient thyroid ultrasound for further evaluation.(Ref:
[HOSPITAL]. [DATE]): 143-50).

Aortic Atherosclerosis (ABW89-RYX.X).

## 2021-06-25 ENCOUNTER — Ambulatory Visit (HOSPITAL_COMMUNITY)
Admission: EM | Admit: 2021-06-25 | Discharge: 2021-06-25 | Disposition: A | Discharge status: Home / Self Care | Payer: 59

## 2021-06-25 NOTE — Progress Notes (Signed)
Patient left without being seen by provider due to long wait time.

## 2021-06-25 NOTE — Progress Notes (Signed)
°   06/25/21 1123  [facility] Triage Screening (Walk-ins at [facility] only)  What Is the Reason for Your Visit/Call Today? 66 year old present to [facility] with her daughter with symptoms of depression triggered by unresolved grief. Patient report her mother passed 03/04/2018. Report she was receiving grief counseling through hospice but they wanted her to do things she wasn't ready for concerning treatment. Patient is requesting assistance with being linked with a grief therapist. Denied suicidal/homicidal ideations. Report able to feel and see the presence of dead people. Denied auditory hallucinations. Denied hx of mental health hospitalizations. Denied drugs/alcohol usage.  How Long Has This Been Causing You Problems? > than 6 months  Have You Recently Had Any Thoughts About Hurting Yourself? No  Are You Planning to Commit Suicide/Harm Yourself At This time? No  Have you Recently Had Thoughts About Hurting Someone Karolee Ohs? No  Are You Planning To Harm Someone At This Time? No  Are you currently experiencing any auditory, visual or other hallucinations? No  Have You Used Any Alcohol or Drugs in the Past 24 Hours? No  Do you have any current medical co-morbidities that require immediate attention? No  Clinician description of patient physical appearance/behavior: Patient well groomed, cooperative and pleasant.  What Do You Feel Would Help You the Most Today? Stress Management  If access to [facility] was not available, would you have sought care in the Emergency Department? Yes  Determination of Need Routine (7 days)  Options For Referral Outpatient Therapy

## 2021-07-07 ENCOUNTER — Telehealth (HOSPITAL_COMMUNITY): Payer: Self-pay | Admitting: Family

## 2021-07-07 NOTE — BH Assessment (Signed)
Care Management - Follow Up Discharges   Writer attempted to make contact with patient today and was unsuccessful.  Writer left a HIPPA compliant voice message.   Per chart review, patient left without being see by a provider.

## 2021-08-25 ENCOUNTER — Telehealth: Payer: Self-pay

## 2021-08-25 ENCOUNTER — Other Ambulatory Visit (HOSPITAL_COMMUNITY): Payer: Self-pay

## 2021-08-25 NOTE — Telephone Encounter (Signed)
No PA needed for Dexcom sensors.  $0 copay paid by patient's Part B plan. ?

## 2021-08-27 ENCOUNTER — Encounter: Payer: Self-pay | Admitting: Internal Medicine

## 2021-08-27 ENCOUNTER — Other Ambulatory Visit: Payer: Self-pay

## 2021-08-27 ENCOUNTER — Ambulatory Visit (INDEPENDENT_AMBULATORY_CARE_PROVIDER_SITE_OTHER): Payer: Medicare Other | Admitting: Internal Medicine

## 2021-08-27 VITALS — BP 140/82 | HR 102 | Ht 63.5 in | Wt 245.0 lb

## 2021-08-27 DIAGNOSIS — E1129 Type 2 diabetes mellitus with other diabetic kidney complication: Secondary | ICD-10-CM | POA: Diagnosis not present

## 2021-08-27 DIAGNOSIS — R809 Proteinuria, unspecified: Secondary | ICD-10-CM | POA: Diagnosis not present

## 2021-08-27 DIAGNOSIS — E1159 Type 2 diabetes mellitus with other circulatory complications: Secondary | ICD-10-CM

## 2021-08-27 DIAGNOSIS — Z794 Long term (current) use of insulin: Secondary | ICD-10-CM

## 2021-08-27 DIAGNOSIS — E1142 Type 2 diabetes mellitus with diabetic polyneuropathy: Secondary | ICD-10-CM | POA: Diagnosis not present

## 2021-08-27 DIAGNOSIS — E1165 Type 2 diabetes mellitus with hyperglycemia: Secondary | ICD-10-CM

## 2021-08-27 LAB — BASIC METABOLIC PANEL
BUN: 9 mg/dL (ref 6–23)
CO2: 34 mEq/L — ABNORMAL HIGH (ref 19–32)
Calcium: 9.1 mg/dL (ref 8.4–10.5)
Chloride: 98 mEq/L (ref 96–112)
Creatinine, Ser: 0.83 mg/dL (ref 0.40–1.20)
GFR: 73.58 mL/min (ref >60.00)
Glucose, Bld: 429 mg/dL — ABNORMAL HIGH (ref 70–99)
Potassium: 3.8 mEq/L (ref 3.5–5.1)
Sodium: 139 mEq/L (ref 135–145)

## 2021-08-27 LAB — LIPID PANEL
Cholesterol: 190 mg/dL (ref 0–200)
HDL: 65.6 mg/dL (ref >39.00)
LDL Cholesterol: 96 mg/dL (ref 0–99)
NonHDL: 124.01
Total CHOL/HDL Ratio: 3
Triglycerides: 141 mg/dL (ref 0.0–149.0)
VLDL: 28.2 mg/dL (ref 0.0–40.0)

## 2021-08-27 LAB — POCT GLUCOSE (DEVICE FOR HOME USE): Glucose Fasting, POC: 446 mg/dL — AB (ref 70–99)

## 2021-08-27 LAB — POCT GLYCOSYLATED HEMOGLOBIN (HGB A1C): HbA1c POC (<> result, manual entry): >15 % (ref 4.0–5.6)

## 2021-08-27 LAB — MICROALBUMIN / CREATININE URINE RATIO
Creatinine,U: 60.3 mg/dL
Microalb Creat Ratio: 518.1 mg/g — ABNORMAL HIGH (ref 0.0–30.0)
Microalb, Ur: 312.4 mg/dL — ABNORMAL HIGH (ref 0.0–1.9)

## 2021-08-27 MED ORDER — LANTUS SOLOSTAR 100 UNIT/ML ~~LOC~~ SOPN: 60.0000 [IU] | PEN_INJECTOR | Freq: Every day | SUBCUTANEOUS | 3 refills | Status: DC

## 2021-08-27 NOTE — Patient Instructions (Addendum)
-   Increase Lantus 60 units daily  ?- Continue Metformin 500 mg twice a day  ?- Continue  Novolog 20 units with each meal  ? ? ? ? ?HOW TO TREAT LOW BLOOD SUGARS (Blood sugar LESS THAN 70 MG/DL) ?Please follow the RULE OF 15 for the treatment of hypoglycemia treatment (when your (blood sugars are less than 70 mg/dL)  ? ?STEP 1: Take 15 grams of carbohydrates when your blood sugar is low, which includes:  ?3-4 GLUCOSE TABS  OR ?3-4 OZ OF JUICE OR REGULAR SODA OR ?ONE TUBE OF GLUCOSE GEL   ? ?STEP 2: RECHECK blood sugar in 15 MINUTES ?STEP 3: If your blood sugar is still low at the 15 minute recheck --> then, go back to STEP 1 and treat AGAIN with another 15 grams of carbohydrates. ? ?

## 2021-08-27 NOTE — Progress Notes (Signed)
? ?Name: Whitney Sloan  ?Age/ Sex: 66 y.o., female   ?MRN/ DOB: [id number], 07-Mar-1956    ? ?PCP: Camillia Herter, NP   ?Reason for Endocrinology Evaluation: Type 2 Diabetes Mellitus  ?Initial Endocrine Consultative Visit: 09/10/2018  ? ? ?PATIENT IDENTIFIER: [name] is a 66 y.o. female with a past medical history of HTN, T2DM and Dyslipidemia. The patient has followed with Endocrinology clinic since 09/10/2018 for consultative assistance with management of her diabetes. ? ?DIABETIC HISTORY:  ?Whitney Sloan was diagnosed with T2DM in 2008, she has been on Metformin, Glipizide, Januvia and Actos in the past.She has been on insulin therapy for years. Her hemoglobin A1c has ranged from 8.0% in 2011, peaking at 12.5% in 2010. ? ?On her initial visit to our clinic her A1c was 11.0% , she was on lantus, Invokana, Metfomin and Pioglitazone but she was not taking them. Bydureon was added.  ? ?Invokana was stopped by the patient by 06/2019 due to recurrent yeast infections ?Prandial insulin added 07/2019 ? ?SUBJECTIVE:  ? ?During the last visit (03/03/2021): A1c 13.0% .  Adjusted MDi regimen and continued metformin and started Rybelsus  ? ? ? ?Today (08/27/2021): Whitney Sloan is here for a follow up on diabetes..  She has been checking glucose once a week . The patient has not had hypoglycemic episodes since the last clinic visit.  ? ? ?She lives with her daughter ?Stopped Rybelsus in 05/2021 due to vomiting  ? ?Denies recent nausea, vomiting or diarrhea ? ?Today she ate a banana and orange juice  ? ? ? ?HOME DIABETES REGIMEN:  ?Lantus 50 units daily ?Novolog 20 units with each meal ?Metformin 1000 mg BID  ?Rybelsus 14 mg daily - not taking  ? ? ? ? ?METER DOWNLOAD SUMMARY: did not bring  ? ? ? ? ?HISTORY:  ?Past Medical History:  ?Past Medical History:  ?Diagnosis Date  ? Abnormal stress test 12/31/2019  ? medium defect of moderate severity present in the basal inferior, mid inferior and apex location.  Findings  consistent with ischemia  ? Chronic diastolic (congestive) heart failure (HCC)   ? Diabetes mellitus   ? Hyperlipemia   ? Hypertension   ? Psoriasis   ? ?Past Surgical History:  ?Past Surgical History:  ?Procedure Laterality Date  ? ANAL FISSURE REPAIR    ? ENDOMETRIAL ABLATION    ? ?Social History:  reports that she has never smoked. She has never used smokeless tobacco. She reports that she does not drink alcohol and does not use drugs. ?Family History:  ?Family History  ?Problem Relation Age of Onset  ? Dementia Mother   ? Renal Disease Father   ? Diabetes Other   ? ? ? ?HOME MEDICATIONS: ?Allergies as of 08/27/2021   ? ?   Reactions  ? Oxycodone-acetaminophen Nausea And Vomiting  ? ?  ? ?  ?Medication List  ?  ? ?  ? Accurate as of August 27, 2021  1:39 PM. If you have any questions, ask your nurse or doctor.  ?  ?  ? ?  ? ?Aspir-Low 81 MG EC tablet ?Generic drug: aspirin ?Take 81 mg by mouth every morning. ?  ?atorvastatin 20 MG tablet ?Commonly known as: LIPITOR ?Take 20 mg by mouth every evening. ?  ?carvedilol 25 MG tablet ?Commonly known as: Coreg ?Take 1 tablet (25 mg total) by mouth 2 (two) times daily. ?  ?clotrimazole 1 % vaginal cream ?Commonly known as: GYNE-LOTRIMIN ?Place 1 Applicatorful  vaginally daily as needed (yeast). ?  ?Dexcom G6 Receiver Devi ?1 Device by Does not apply route as directed. ?  ?Dexcom G6 Sensor Misc ?1 Device by Does not apply route as directed. ?  ?Dexcom G6 Transmitter Misc ?1 Device by Does not apply route as directed. ?  ?Diclofenac Sodium 3 % Gel ?Apply 2 g topically 2 (two) times daily as needed. ?  ?Dilt-XR 240 MG 24 hr capsule ?Generic drug: diltiazem ?Take 240 mg by mouth daily. ?  ?Ex-Lax 15 MG Tabs ?Generic drug: Sennosides ?Take 15 mg by mouth daily as needed (constipation). ?  ?ezetimibe 10 MG tablet ?Commonly known as: ZETIA ?Take 10 mg by mouth at bedtime. ?  ?furosemide 80 MG tablet ?Commonly known as: LASIX ?Take 1 tablet (80 mg total) by mouth 2 (two) times  daily. ?  ?ibuprofen 200 MG tablet ?Commonly known as: ADVIL ?Take 400 mg by mouth every 4 (four) hours as needed for fever, headache or moderate pain. ?  ?Insulin Pen Needle 32G X 4 MM Misc ?1 Device by Does not apply route as directed. ?  ?Lantus SoloStar 100 UNIT/ML Solostar Pen ?Generic drug: insulin glargine ?Inject 50 Units into the skin daily. ?  ?losartan 100 MG tablet ?Commonly known as: COZAAR ?Take 1 tablet (100 mg total) by mouth daily. ?  ?magnesium oxide 400 MG tablet ?Commonly known as: MAG-OX ?Take 1 tablet (400 mg total) by mouth daily. ?  ?metFORMIN 500 MG tablet ?Commonly known as: GLUCOPHAGE ?Take 1 tablet (500 mg total) by mouth 2 (two) times daily with a meal. ?  ?metoprolol succinate 50 MG 24 hr tablet ?Commonly known as: TOPROL-XL ?Take 50 mg by mouth daily. ?  ?NovoLOG FlexPen 100 UNIT/ML FlexPen ?Generic drug: insulin aspart ?Inject 20 Units into the skin 3 (three) times daily with meals. ?  ?nystatin powder ?Commonly known as: MYCOSTATIN/NYSTOP ?Apply 1 application topically daily as needed (yeast). ?  ?polyethylene glycol 17 g packet ?Commonly known as: MIRALAX / GLYCOLAX ?Take 17 g by mouth daily. ?  ?potassium chloride SA 20 MEQ tablet ?Commonly known as: KLOR-CON M ?Take 1 tablet (20 mEq total) by mouth daily. ?  ?Rybelsus 14 MG Tabs ?Generic drug: Semaglutide ?Take 14 mg by mouth daily. ?  ?spironolactone 25 MG tablet ?Commonly known as: Aldactone ?Take 1 tablet (25 mg total) by mouth daily. ?  ?tamsulosin 0.4 MG Caps capsule ?Commonly known as: FLOMAX ?Take 0.4 mg by mouth daily. ?  ?torsemide 20 MG tablet ?Commonly known as: DEMADEX ?Take 40 mg by mouth daily. ?  ?VITAMIN D PO ?Take 1 capsule by mouth daily. ?  ? ?  ? ? ? ?OBJECTIVE:  ? ?Vital Signs: BP 140/82 (BP Location: Left Arm, Patient Position: Sitting, Cuff Size: Large)   Pulse (!) 102   Ht 5' 3.5" (1.613 m)   Wt 245 lb (111.1 kg)   SpO2 99%   BMI 42.72 kg/m?   ?Wt Readings from Last 3 Encounters:  ?08/27/21 245 lb  (111.1 kg)  ?03/24/21 234 lb (106.1 kg)  ?03/10/21 242 lb 9.6 oz (110 kg)  ? ? ? ?Exam: ?General: Pt appears well and is in NAD  ?Lungs: CTA   ?Heart: RRR  ?Extremities: Trace  pretibial edema.  ?Neuro: MS is good with appropriate affect, pt is alert and Ox3  ? ? ?DM Foot Exam 03/10/2021 ?The skin of the feet is without sores or ulcerations. ?The pedal pulses 1+ B/L ?The sensation is decreased to a screening 5.07, 10  gram monofilament bilaterally ? ?  ? ? ? ?DATA REVIEWED: ? ?Lab Results  ?Component Value Date  ? HGBA1C >15.0 03/10/2021  ? HGBA1C 13.0 (A) 10/14/2020  ? HGBA1C 8.7 (A) 03/26/2020  ? ? Latest Reference Range & Units 08/27/21 14:04  ?Sodium 135 - 145 mEq/L 139  ?Potassium 3.5 - 5.1 mEq/L 3.8  ?Chloride 96 - 112 mEq/L 98  ?CO2 19 - 32 mEq/L 34 (H)  ?Glucose 70 - 99 mg/dL 429 (H)  ?BUN 6 - 23 mg/dL 9  ?Creatinine 0.40 - 1.20 mg/dL 0.83  ?Calcium 8.4 - 10.5 mg/dL 9.1  ?GFR >60.00 mL/min 73.58  ? ? Latest Reference Range & Units 08/27/21 14:04  ?Total CHOL/HDL Ratio  3  ?Cholesterol 0 - 200 mg/dL 190  ?HDL Cholesterol >39.00 mg/dL 65.60  ?LDL (calc) 0 - 99 mg/dL 96  ?MICROALB/CREAT RATIO 0.0 - 30.0 mg/g 518.1 (H)  ?NonHDL  124.01  ?Triglycerides 0.0 - 149.0 mg/dL 141.0  ?VLDL 0.0 - 40.0 mg/dL 28.2  ? ? ?     ? ? She ate a banana and drank orange juice this morning with a BG 421 mg/dL  ? ?ASSESSMENT / PLAN / RECOMMENDATIONS:  ? ?1) Type 2 Diabetes Mellitus,Poorly controlled, With neuropathic complications and microalbuminuria- Most recent A1c of >15.0 %. Goal A1c < 7.0 %.   ? ? ?-Ms. Ms. A1c > 15%, and her in office BG was 421 mg/DL, patient had orange juice and a banana without any prandial insulin today hence hypoglycemia ?-I have discussed multiple times the importance of taking prandial insulin while eating, I have also advised her multiple times to avoid drinking sugar sweetened beverages. ?-She continues with imperfect adherence to medication intake, we again discussed that my wall is to advise her on  glucose control to prevent blindness, end-stage renal disease, and amputations ?-I have asked Whitney Sloan to put  a little bit more effort into her diabetes care as at this time, or ask family for help ,but at this time my ro

## 2021-08-30 ENCOUNTER — Other Ambulatory Visit: Payer: Self-pay

## 2021-08-30 ENCOUNTER — Emergency Department (HOSPITAL_COMMUNITY): Payer: Medicare Other

## 2021-08-30 ENCOUNTER — Inpatient Hospital Stay (HOSPITAL_COMMUNITY)
Admission: EM | Admit: 2021-08-30 | Discharge: 2021-09-02 | DRG: 638 | Disposition: A | Discharge status: Home Health | Payer: Medicare Other | Attending: Internal Medicine | Admitting: Internal Medicine

## 2021-08-30 ENCOUNTER — Encounter (HOSPITAL_COMMUNITY): Payer: Self-pay

## 2021-08-30 DIAGNOSIS — E782 Mixed hyperlipidemia: Secondary | ICD-10-CM | POA: Diagnosis present

## 2021-08-30 DIAGNOSIS — I10 Essential (primary) hypertension: Secondary | ICD-10-CM | POA: Diagnosis present

## 2021-08-30 DIAGNOSIS — E1159 Type 2 diabetes mellitus with other circulatory complications: Secondary | ICD-10-CM | POA: Diagnosis present

## 2021-08-30 DIAGNOSIS — I5030 Unspecified diastolic (congestive) heart failure: Secondary | ICD-10-CM | POA: Diagnosis present

## 2021-08-30 DIAGNOSIS — I5032 Chronic diastolic (congestive) heart failure: Secondary | ICD-10-CM | POA: Diagnosis present

## 2021-08-30 DIAGNOSIS — R778 Other specified abnormalities of plasma proteins: Secondary | ICD-10-CM | POA: Diagnosis present

## 2021-08-30 DIAGNOSIS — R7989 Other specified abnormal findings of blood chemistry: Secondary | ICD-10-CM | POA: Diagnosis present

## 2021-08-30 DIAGNOSIS — R531 Weakness: Principal | ICD-10-CM

## 2021-08-30 DIAGNOSIS — Z20822 Contact with and (suspected) exposure to covid-19: Secondary | ICD-10-CM | POA: Diagnosis present

## 2021-08-30 DIAGNOSIS — N390 Urinary tract infection, site not specified: Secondary | ICD-10-CM | POA: Diagnosis present

## 2021-08-30 DIAGNOSIS — E1169 Type 2 diabetes mellitus with other specified complication: Secondary | ICD-10-CM | POA: Diagnosis present

## 2021-08-30 DIAGNOSIS — Z833 Family history of diabetes mellitus: Secondary | ICD-10-CM

## 2021-08-30 DIAGNOSIS — Z6841 Body Mass Index (BMI) 40.0 and over, adult: Secondary | ICD-10-CM

## 2021-08-30 DIAGNOSIS — Z79899 Other long term (current) drug therapy: Secondary | ICD-10-CM

## 2021-08-30 DIAGNOSIS — I110 Hypertensive heart disease with heart failure: Secondary | ICD-10-CM | POA: Diagnosis present

## 2021-08-30 DIAGNOSIS — Z794 Long term (current) use of insulin: Secondary | ICD-10-CM | POA: Diagnosis not present

## 2021-08-30 DIAGNOSIS — I152 Hypertension secondary to endocrine disorders: Secondary | ICD-10-CM | POA: Diagnosis present

## 2021-08-30 DIAGNOSIS — E1165 Type 2 diabetes mellitus with hyperglycemia: Principal | ICD-10-CM | POA: Diagnosis present

## 2021-08-30 DIAGNOSIS — L8990 Pressure ulcer of unspecified site, unspecified stage: Secondary | ICD-10-CM | POA: Insufficient documentation

## 2021-08-30 DIAGNOSIS — G9340 Encephalopathy, unspecified: Secondary | ICD-10-CM | POA: Diagnosis present

## 2021-08-30 DIAGNOSIS — Z7982 Long term (current) use of aspirin: Secondary | ICD-10-CM

## 2021-08-30 DIAGNOSIS — R739 Hyperglycemia, unspecified: Secondary | ICD-10-CM

## 2021-08-30 DIAGNOSIS — L89312 Pressure ulcer of right buttock, stage 2: Secondary | ICD-10-CM | POA: Diagnosis present

## 2021-08-30 DIAGNOSIS — J069 Acute upper respiratory infection, unspecified: Secondary | ICD-10-CM | POA: Diagnosis present

## 2021-08-30 DIAGNOSIS — Z7984 Long term (current) use of oral hypoglycemic drugs: Secondary | ICD-10-CM

## 2021-08-30 DIAGNOSIS — N179 Acute kidney failure, unspecified: Secondary | ICD-10-CM | POA: Diagnosis present

## 2021-08-30 LAB — CBC WITH DIFFERENTIAL/PLATELET
Abs Immature Granulocytes: 0.01 10*3/uL (ref 0.00–0.07)
Basophils Absolute: 0 10*3/uL (ref 0.0–0.1)
Basophils Relative: 0 %
Eosinophils Absolute: 0 10*3/uL (ref 0.0–0.5)
Eosinophils Relative: 0 %
HCT: 40.3 % (ref 36.0–46.0)
Hemoglobin: 12.5 g/dL (ref 12.0–15.0)
Immature Granulocytes: 0 %
Lymphocytes Relative: 28 %
Lymphs Abs: 2 10*3/uL (ref 0.7–4.0)
MCH: 27.1 pg (ref 26.0–34.0)
MCHC: 31 g/dL (ref 30.0–36.0)
MCV: 87.2 fL (ref 80.0–100.0)
Monocytes Absolute: 0.9 10*3/uL (ref 0.1–1.0)
Monocytes Relative: 13 %
Neutro Abs: 4.3 10*3/uL (ref 1.7–7.7)
Neutrophils Relative %: 59 %
Platelets: 189 10*3/uL (ref 150–400)
RBC: 4.62 MIL/uL (ref 3.87–5.11)
RDW: 14.1 % (ref 11.5–15.5)
WBC: 7.2 10*3/uL (ref 4.0–10.5)
nRBC: 0 % (ref 0.0–0.2)

## 2021-08-30 LAB — URINALYSIS, ROUTINE W REFLEX MICROSCOPIC
Bilirubin Urine: NEGATIVE
Glucose, UA: >=500 mg/dL — AB
Ketones, ur: NEGATIVE mg/dL
Nitrite: NEGATIVE
Protein, ur: >=300 mg/dL — AB
Specific Gravity, Urine: 1.017 (ref 1.005–1.030)
pH: 5 (ref 5.0–8.0)

## 2021-08-30 LAB — I-STAT CHEM 8, ED
BUN: 35 mg/dL — ABNORMAL HIGH (ref 8–23)
Calcium, Ion: 0.91 mmol/L — ABNORMAL LOW (ref 1.15–1.40)
Chloride: 94 mmol/L — ABNORMAL LOW (ref 98–111)
Creatinine, Ser: 1.4 mg/dL — ABNORMAL HIGH (ref 0.44–1.00)
Glucose, Bld: 468 mg/dL — ABNORMAL HIGH (ref 70–99)
HCT: 42 % (ref 36.0–46.0)
Hemoglobin: 14.3 g/dL (ref 12.0–15.0)
Potassium: 4 mmol/L (ref 3.5–5.1)
Sodium: 132 mmol/L — ABNORMAL LOW (ref 135–145)
TCO2: 27 mmol/L (ref 22–32)

## 2021-08-30 LAB — COMPREHENSIVE METABOLIC PANEL
ALT: 14 U/L (ref 0–44)
AST: 26 U/L (ref 15–41)
Albumin: 2.4 g/dL — ABNORMAL LOW (ref 3.5–5.0)
Alkaline Phosphatase: 52 U/L (ref 38–126)
Anion gap: 13 (ref 5–15)
BUN: 30 mg/dL — ABNORMAL HIGH (ref 8–23)
CO2: 25 mmol/L (ref 22–32)
Calcium: 8.2 mg/dL — ABNORMAL LOW (ref 8.9–10.3)
Chloride: 93 mmol/L — ABNORMAL LOW (ref 98–111)
Creatinine, Ser: 1.39 mg/dL — ABNORMAL HIGH (ref 0.44–1.00)
GFR, Estimated: 42 mL/min — ABNORMAL LOW (ref >60)
Glucose, Bld: 461 mg/dL — ABNORMAL HIGH (ref 70–99)
Potassium: 4.2 mmol/L (ref 3.5–5.1)
Sodium: 131 mmol/L — ABNORMAL LOW (ref 135–145)
Total Bilirubin: 0.7 mg/dL (ref 0.3–1.2)
Total Protein: 6.7 g/dL (ref 6.5–8.1)

## 2021-08-30 LAB — RESP PANEL BY RT-PCR (FLU A&B, COVID) ARPGX2
Influenza A by PCR: NEGATIVE
Influenza B by PCR: NEGATIVE
SARS Coronavirus 2 by RT PCR: NEGATIVE

## 2021-08-30 LAB — TYPE AND SCREEN
ABO/RH(D): A POS
Antibody Screen: NEGATIVE

## 2021-08-30 LAB — TROPONIN I (HIGH SENSITIVITY)
Troponin I (High Sensitivity): 89 ng/L — ABNORMAL HIGH (ref <18)
Troponin I (High Sensitivity): 86 ng/L — ABNORMAL HIGH (ref <18)

## 2021-08-30 LAB — CK: Total CK: 136 U/L (ref 38–234)

## 2021-08-30 LAB — GROUP A STREP BY PCR: Group A Strep by PCR: NOT DETECTED

## 2021-08-30 LAB — CBG MONITORING, ED
Glucose-Capillary: 412 mg/dL — ABNORMAL HIGH (ref 70–99)
Glucose-Capillary: 313 mg/dL — ABNORMAL HIGH (ref 70–99)

## 2021-08-30 LAB — PROTIME-INR
INR: 1.1 (ref 0.8–1.2)
Prothrombin Time: 13.8 seconds (ref 11.4–15.2)

## 2021-08-30 LAB — LACTIC ACID, PLASMA
Lactic Acid, Venous: 2.3 mmol/L (ref 0.5–1.9)
Lactic Acid, Venous: 3 mmol/L (ref 0.5–1.9)

## 2021-08-30 LAB — ABO/RH: ABO/RH(D): A POS

## 2021-08-30 MED ORDER — INSULIN GLARGINE-YFGN 100 UNIT/ML ~~LOC~~ SOLN
60.0000 [IU] | Freq: Every day | SUBCUTANEOUS | Status: DC
  Administered 2021-08-31 – 2021-09-02 (×3): 60 [IU] via SUBCUTANEOUS
  Filled 2021-08-30 (×4): qty 0.6

## 2021-08-30 MED ORDER — DILTIAZEM HCL ER COATED BEADS 240 MG PO CP24
240.0000 mg | ORAL_CAPSULE | Freq: Every day | ORAL | Status: DC
  Administered 2021-09-01 – 2021-09-02 (×2): 240 mg via ORAL
  Filled 2021-08-30 (×4): qty 1

## 2021-08-30 MED ORDER — SENNOSIDES-DOCUSATE SODIUM 8.6-50 MG PO TABS: 1.0000 | ORAL_TABLET | Freq: Every evening | ORAL | Status: DC | PRN

## 2021-08-30 MED ORDER — ATORVASTATIN CALCIUM 10 MG PO TABS
20.0000 mg | ORAL_TABLET | Freq: Every evening | ORAL | Status: DC
  Administered 2021-09-01: 20 mg via ORAL
  Filled 2021-08-30 (×2): qty 2

## 2021-08-30 MED ORDER — ATORVASTATIN CALCIUM 20 MG PO TABS: 20.0000 mg | ORAL_TABLET | Freq: Every evening | ORAL | 3 refills | Status: AC

## 2021-08-30 MED ORDER — INSULIN ASPART 100 UNIT/ML IJ SOLN
0.0000 [IU] | Freq: Three times a day (TID) | INTRAMUSCULAR | Status: DC
  Administered 2021-08-31: 7 [IU] via SUBCUTANEOUS
  Administered 2021-08-31 (×2): 5 [IU] via SUBCUTANEOUS
  Administered 2021-09-01 (×2): 1 [IU] via SUBCUTANEOUS
  Administered 2021-09-01 – 2021-09-02 (×2): 2 [IU] via SUBCUTANEOUS
  Administered 2021-09-02: 1 [IU] via SUBCUTANEOUS

## 2021-08-30 MED ORDER — GUAIFENESIN ER 600 MG PO TB12
600.0000 mg | ORAL_TABLET | Freq: Two times a day (BID) | ORAL | Status: DC | PRN
  Administered 2021-08-31: 600 mg via ORAL
  Filled 2021-08-30: qty 1

## 2021-08-30 MED ORDER — SODIUM CHLORIDE 0.9% FLUSH
3.0000 mL | Freq: Two times a day (BID) | INTRAVENOUS | Status: DC
  Administered 2021-08-31 – 2021-09-02 (×6): 3 mL via INTRAVENOUS

## 2021-08-30 MED ORDER — INSULIN ASPART 100 UNIT/ML IJ SOLN
0.0000 [IU] | Freq: Every day | INTRAMUSCULAR | Status: DC
  Administered 2021-08-31 (×2): 3 [IU] via SUBCUTANEOUS

## 2021-08-30 MED ORDER — ONDANSETRON HCL 4 MG PO TABS: 4.0000 mg | ORAL_TABLET | Freq: Four times a day (QID) | ORAL | Status: DC | PRN

## 2021-08-30 MED ORDER — INSULIN ASPART 100 UNIT/ML IJ SOLN
10.0000 [IU] | Freq: Once | INTRAMUSCULAR | Status: AC
Start: 2021-08-30 — End: 2021-08-30
  Administered 2021-08-30: 10 [IU] via INTRAVENOUS

## 2021-08-30 MED ORDER — SODIUM CHLORIDE 0.9 % IV BOLUS
500.0000 mL | Freq: Once | INTRAVENOUS | Status: AC
  Administered 2021-08-30: 500 mL via INTRAVENOUS

## 2021-08-30 MED ORDER — CARVEDILOL 25 MG PO TABS
25.0000 mg | ORAL_TABLET | Freq: Two times a day (BID) | ORAL | Status: DC
  Administered 2021-08-31 – 2021-09-02 (×4): 25 mg via ORAL
  Filled 2021-08-30 (×5): qty 1

## 2021-08-30 MED ORDER — SODIUM CHLORIDE 0.9 % IV SOLN
1.0000 g | Freq: Once | INTRAVENOUS | Status: AC
Start: 2021-08-30 — End: 2021-08-31
  Administered 2021-08-30: 1 g via INTRAVENOUS
  Filled 2021-08-30: qty 10

## 2021-08-30 MED ORDER — ACETAMINOPHEN 650 MG RE SUPP
650.0000 mg | Freq: Four times a day (QID) | RECTAL | Status: DC | PRN
  Administered 2021-08-31: 650 mg via RECTAL
  Filled 2021-08-30: qty 1

## 2021-08-30 MED ORDER — INSULIN ASPART 100 UNIT/ML IJ SOLN: 20.0000 [IU] | Freq: Three times a day (TID) | INTRAMUSCULAR | Status: DC

## 2021-08-30 MED ORDER — LOSARTAN POTASSIUM 100 MG PO TABS: 100.0000 mg | ORAL_TABLET | Freq: Every day | ORAL | 3 refills | Status: AC

## 2021-08-30 MED ORDER — ACETAMINOPHEN 325 MG PO TABS
650.0000 mg | ORAL_TABLET | Freq: Four times a day (QID) | ORAL | Status: DC | PRN
  Administered 2021-08-31 – 2021-09-01 (×3): 650 mg via ORAL
  Filled 2021-08-30 (×3): qty 2

## 2021-08-30 MED ORDER — EZETIMIBE 10 MG PO TABS
10.0000 mg | ORAL_TABLET | Freq: Every day | ORAL | Status: DC
  Administered 2021-08-31 – 2021-09-01 (×2): 10 mg via ORAL
  Filled 2021-08-30 (×2): qty 1

## 2021-08-30 MED ORDER — FUROSEMIDE 40 MG PO TABS
80.0000 mg | ORAL_TABLET | Freq: Two times a day (BID) | ORAL | Status: DC
  Administered 2021-08-31: 80 mg via ORAL
  Filled 2021-08-30: qty 2

## 2021-08-30 MED ORDER — ONDANSETRON HCL 4 MG/2ML IJ SOLN
4.0000 mg | Freq: Four times a day (QID) | INTRAMUSCULAR | Status: DC | PRN
Start: 2021-08-30 — End: 2021-09-03
  Administered 2021-08-31: 4 mg via INTRAVENOUS
  Filled 2021-08-30: qty 2

## 2021-08-30 MED ORDER — SODIUM CHLORIDE 0.9 % IV BOLUS: 1000.0000 mL | Freq: Once | INTRAVENOUS | Status: DC

## 2021-08-30 MED ORDER — SODIUM CHLORIDE 0.9 % IV BOLUS
1000.0000 mL | Freq: Once | INTRAVENOUS | Status: AC
  Administered 2021-08-30: 1000 mL via INTRAVENOUS

## 2021-08-30 MED ORDER — ENOXAPARIN SODIUM 40 MG/0.4ML IJ SOSY
40.0000 mg | PREFILLED_SYRINGE | INTRAMUSCULAR | Status: DC
  Administered 2021-08-31 – 2021-09-02 (×3): 40 mg via SUBCUTANEOUS
  Filled 2021-08-30 (×4): qty 0.4

## 2021-08-30 MED ORDER — MENTHOL 3 MG MT LOZG: 1.0000 | LOZENGE | OROMUCOSAL | Status: DC | PRN

## 2021-08-30 NOTE — ED Notes (Signed)
Attempted I&O cath to obtain urine specimen and was unsuccessful. Pt extremely tender and significant yeast present.  ?

## 2021-08-30 NOTE — ED Notes (Signed)
Patient transported to CT 

## 2021-08-30 NOTE — ED Notes (Signed)
Pt's SpO2 dropping to 87 % when pt falls asleep. Pt placed on 2L O2 via N/C.  ?

## 2021-08-30 NOTE — ED Provider Notes (Signed)
?[facility] ?Provider Note ? ? ?CSN: [id number] ?Arrival date & time: 08/30/21  1837 ? ?  ? ?History ? ?Chief Complaint  ?Patient presents with  ? Weakness  ? ? ?Whitney Sloan is a 66 y.o. female. ? ?66 year old female with prior medical history as detailed below presents for evaluation.  She arrives by EMS transport from home.  She reports that she has been very weak for the last 3 days.  Since Friday she has been sitting on her couch.  She has not left her account.  She reports that she has urinated on pee pads since she cannot ambulate to the bathroom.  She reports that family nurse having her something to eat and drink.  She has not been taking any of her medicines since being on the couch.  She complains of sore throat, cough, generalized weakness, and nausea and vomiting. ? ?Patient reports that some of her family members have been diagnosed with strep throat. ? ?She is alert and pleasant.  She complains of global weakness.  She denies significant nausea now.  She reports that she has not taken any of her oral medicines or her insulin. ? ?EMS reports that the patient's sugar was greater than 600. ? ?The history is provided by the patient and medical records.  ?Weakness ?Severity:  Moderate ?Onset quality:  Gradual ?Duration:  3 days ?Timing:  Constant ?Progression:  Worsening ?Chronicity:  New ? ?  ? ?Home Medications ?Prior to Admission medications   ?Medication Sig Start Date End Date Taking? Authorizing Provider  ?aspirin (ASPIR-LOW) 81 MG EC tablet Take 81 mg by mouth every morning.    [provider]  ?atorvastatin (LIPITOR) 20 MG tablet Take 1 tablet (20 mg total) by mouth every evening. 08/30/21   Shamleffer, Melanie Crazier, MD  ?carvedilol (COREG) 25 MG tablet Take 1 tablet (25 mg total) by mouth 2 (two) times daily. 12/18/20 12/18/21  Florencia Reasons, MD  ?Cholecalciferol (VITAMIN D PO) Take 1 capsule by mouth daily.    [provider]  ?clotrimazole  (GYNE-LOTRIMIN) 1 % vaginal cream Place 1 Applicatorful vaginally daily as needed (yeast).    [provider]  ?Diclofenac Sodium 3 % GEL Apply 2 g topically 2 (two) times daily as needed. 04/10/20   Dickie La, MD  ?DILT-XR 240 MG 24 hr capsule Take 240 mg by mouth daily. 02/18/21   [provider]  ?ezetimibe (ZETIA) 10 MG tablet Take 10 mg by mouth at bedtime. 08/21/19   [provider]  ?furosemide (LASIX) 80 MG tablet Take 1 tablet (80 mg total) by mouth 2 (two) times daily. 12/18/20   Florencia Reasons, MD  ?ibuprofen (ADVIL) 200 MG tablet Take 400 mg by mouth every 4 (four) hours as needed for fever, headache or moderate pain.     [provider]  ?insulin aspart (NOVOLOG FLEXPEN) 100 UNIT/ML FlexPen Inject 20 Units into the skin 3 (three) times daily with meals. 03/10/21   Shamleffer, Melanie Crazier, MD  ?insulin glargine (LANTUS SOLOSTAR) 100 UNIT/ML Solostar Pen Inject 60 Units into the skin daily. 08/27/21   Shamleffer, Melanie Crazier, MD  ?Insulin Pen Needle 32G X 4 MM MISC 1 Device by Does not apply route as directed. 03/26/20   Shamleffer, Melanie Crazier, MD  ?losartan (COZAAR) 100 MG tablet Take 1 tablet (100 mg total) by mouth daily. 08/30/21   Shamleffer, Melanie Crazier, MD  ?magnesium oxide (MAG-OX) 400 MG tablet Take 1 tablet (400 mg total)  by mouth daily. 01/03/20   Donato Heinz, MD  ?metFORMIN (GLUCOPHAGE) 500 MG tablet Take 1 tablet (500 mg total) by mouth 2 (two) times daily with a meal. 03/10/21   Shamleffer, Melanie Crazier, MD  ?metoprolol succinate (TOPROL-XL) 50 MG 24 hr tablet Take 50 mg by mouth daily. 02/18/21   [provider]  ?nystatin (MYCOSTATIN/NYSTOP) powder Apply 1 application topically daily as needed (yeast).    [provider]  ?polyethylene glycol (MIRALAX / GLYCOLAX) 17 g packet Take 17 g by mouth daily.    [provider]  ?potassium chloride SA (KLOR-CON) 20 MEQ tablet Take 1 tablet (20 mEq total) by mouth daily.  12/18/20 03/18/21  Florencia Reasons, MD  ?Sennosides (EX-LAX) 15 MG TABS Take 15 mg by mouth daily as needed (constipation).    [provider]  ?tamsulosin (FLOMAX) 0.4 MG CAPS capsule Take 0.4 mg by mouth daily.    [provider]  ?torsemide (DEMADEX) 20 MG tablet Take 40 mg by mouth daily. 08/16/21   [provider]  ?   ? ?Allergies    ?Oxycodone-acetaminophen   ? ?Review of Systems   ?Review of Systems  ?Neurological:  Positive for weakness.  ?All other systems reviewed and are negative. ? ?Physical Exam ?Updated Vital Signs ?BP (!) 145/78 (BP Location: Right Arm)   Pulse (!) 102   Temp 98.7 ?F (37.1 ?C) (Oral)   Resp (!) 24   Ht 5' 3.5" (1.613 m)   Wt 108.9 kg   SpO2 90%   BMI 41.85 kg/m?  ?Physical Exam ?Vitals and nursing note reviewed.  ?Constitutional:   ?   General: She is not in acute distress. ?   Appearance: Normal appearance. She is well-developed.  ?HENT:  ?   Head: Normocephalic and atraumatic.  ?   Mouth/Throat:  ?   Mouth: Mucous membranes are dry.  ?   Comments: Mild diffuse erythema to the posterior pharynx. ? ?No evidence of PTA ?Eyes:  ?   Conjunctiva/sclera: Conjunctivae normal.  ?   Pupils: Pupils are equal, round, and reactive to light.  ?Cardiovascular:  ?   Rate and Rhythm: Normal rate and regular rhythm.  ?   Heart sounds: Normal heart sounds.  ?Pulmonary:  ?   Effort: Pulmonary effort is normal. No respiratory distress.  ?   Breath sounds: Normal breath sounds.  ?Abdominal:  ?   General: There is no distension.  ?   Palpations: Abdomen is soft.  ?   Tenderness: There is no abdominal tenderness.  ?Musculoskeletal:     ?   General: No deformity. Normal range of motion.  ?   Cervical back: Normal range of motion and neck supple.  ?Skin: ?   General: Skin is warm and dry.  ?Neurological:  ?   General: No focal deficit present.  ?   Mental Status: She is alert and oriented to person, place, and time.  ? ? ?ED Results / Procedures / Treatments   ?Labs ?(all labs ordered  are listed, but only abnormal results are displayed) ?Labs Reviewed  ?RESP PANEL BY RT-PCR (FLU A&B, COVID) [id number]  ?CULTURE, BLOOD (ROUTINE X 2)  ?CULTURE, BLOOD (ROUTINE X 2)  ?GROUP A STREP BY PCR  ?COMPREHENSIVE METABOLIC PANEL  ?CBC WITH DIFFERENTIAL/PLATELET  ?URINALYSIS, ROUTINE W REFLEX MICROSCOPIC  ?CK  ?PROTIME-INR  ?LACTIC ACID, PLASMA  ?LACTIC ACID, PLASMA  ?I-STAT CHEM 8, ED  ?CBG MONITORING, ED  ?TYPE AND SCREEN  ?TROPONIN I (HIGH SENSITIVITY)  ? ? ?  EKG ?None ? ?Radiology ?No results found. ? ?Procedures ?Procedures  ? ? ?Medications Ordered in ED ?Medications  ?sodium chloride 0.9 % bolus 500 mL (has no administration in time range)  ? ? ?ED Course/ Medical Decision Making/ A&P ?  ?                        ?Medical Decision Making ?Amount and/or Complexity of Data Reviewed ?Labs: ordered. ?Radiology: ordered. ? ?Risk ?Prescription drug management. ?Decision regarding hospitalization. ? ? ? ?Medical Screen Complete ? ?This patient presented to the ED with complaint of generalized weakness, cough, sore throat, nausea, vomiting, hyperglycemia. ? ?This complaint involves an extensive number of treatment options. The initial differential diagnosis includes, but is not limited to, viral versus bacterial infection, metabolic abnormality, hyperglycemia, DKA, etc. ? ?This presentation is: Acute, Chronic, Self-Limited, Previously Undiagnosed, Uncertain Prognosis, Complicated, Systemic Symptoms, and Threat to Life/Bodily Function ? ?Patient is presenting with complaint of generalized weakness with inability to ambulate over the last 3 days. ? ?Patient with work-up demonstrating hyperglycemia, dehydration, and possible UTI. ? ?Patient would benefit from admission for IV fluids, antibiotics, and control of her sugars. ? ?Hospitalist services aware of case and will evaluate for admission. ? ? ?Co morbidities that complicated the patient's evaluation ? ?Morbid obesity, diabetes ? ? ?Additional history  obtained: ? ?Additional history obtained from EMS ?External records from outside sources obtained and reviewed including prior ED visits and prior Inpatient records.  ? ? ?Lab Tests: ? ?I ordered and personally interpreted la

## 2021-08-30 NOTE — ED Triage Notes (Signed)
Pt BIB [facility] from home. Pt having generalized weakness, cough, sore throat, and N/V that started 3 days ago. EMS report pt's CBG >600. Pt has not been able to take her medications over the weakned due to vomiting. Pt reports 3 episodes of vomiting in last 24 hours. Pt A/Ox4 on arrival.   ? ?EMS admin of NaCl.  ? ?

## 2021-08-30 NOTE — H&P (Signed)
?History and Physical  ? ? ?Whitney Sloan WCB:[id number] DOB: [birth year] DOA: 08/30/2021 ? ?PCP: Rema Fendt, NP  ?Patient coming from: Home via EMS ? ?I have personally briefly reviewed patient's old medical records in [facility] ? ?Chief Complaint: Generalized weakness, cough, sore throat, nausea, vomiting ? ?HPI: ?Whitney Sloan is a 66 y.o. female with medical history significant for diastolic CHF (EF 61-60%, G2DD by TTE 10/31/2019), insulin-dependent T2DM, HTN, HLD, morbid obesity who presented to the ED for evaluation of nausea, vomiting, cough, sore throat, generalized weakness. ? ?Patient states that she has been generally weak with sensation that her legs locked up 3 days ago.  She says that she has been sitting on the couch since then.  She says she normally uses a cane or walker to ambulate but has been unable to do so.  She reports sore throat with a cough and that multiple members of the family have had similar symptoms.  She has not had any significant chest pain.  She denies any dysuria.  She has had some loose stools and mild abdominal discomfort.  She has increased swelling to both her legs above her ankles. ? ?She states that she has not been able to maintain adequate oral intake due to her sore throat causing her to throw up food that she has recently eaten.  She also has not been taking her insulin these last few days. ? ?ED Course  Labs/Imaging on admission: I have personally reviewed following labs and imaging studies. ? ?Initial vitals showed BP 136/77, pulse 98, RR 20, temp 90.7 ?F, SPO2 90% on room air. ? ?Labs show WBC 7.2, hemoglobin 12.5, platelets 189,000, serum glucose 461, sodium 131 (141 corrected for hyperglycemia), potassium 4.2, bicarb 25, BUN 30, creatinine 1.39 (baseline 0.8), LFTs within normal limits, troponin 89, lactic acid 2.3. ? ?SARS-CoV-2 and influenza PCR negative.  Group A strep PCR negative.  Urinalysis shows >500, negative ketones, >300 protein, negative  nitrites, trace leukocytes, 0-5 RBCs, 11-20 WBCs, few bacteria microscopy.  Blood cultures in process. ? ?Portable chest x-ray shows chronic enlarged cardiac silhouette with bibasilar atelectasis. ? ?CT head without contrast negative for acute intracranial abnormality.  Sinusitis changes seen. ? ?Patient was given 10 units NovoLog, 1.5 L normal saline, IV ceftriaxone.  The hospitalist service was consulted to admit for further evaluation and management. ? ?Review of Systems: All systems reviewed and are negative except as documented in history of present illness above. ? ? ?Past Medical History:  ?Diagnosis Date  ? Abnormal stress test 12/31/2019  ? medium defect of moderate severity present in the basal inferior, mid inferior and apex location.  Findings consistent with ischemia  ? Chronic diastolic (congestive) heart failure (HCC)   ? Diabetes mellitus   ? Hyperlipemia   ? Hypertension   ? Psoriasis   ? ? ?Past Surgical History:  ?Procedure Laterality Date  ? ANAL FISSURE REPAIR    ? ENDOMETRIAL ABLATION    ? ? ?Social History: ? reports that she has never smoked. She has never used smokeless tobacco. She reports that she does not drink alcohol and does not use drugs. ? ?Allergies  ?Allergen Reactions  ? Oxycodone-Acetaminophen Nausea And Vomiting  ? ? ?Family History  ?Problem Relation Age of Onset  ? Dementia Mother   ? Renal Disease Father   ? Diabetes Other   ? ? ? ?Prior to Admission medications   ?Medication Sig Start Date End Date Taking? Authorizing Provider  ?aspirin (ASPIR-LOW)  81 MG EC tablet Take 81 mg by mouth every morning.    [provider]  ?atorvastatin (LIPITOR) 20 MG tablet Take 1 tablet (20 mg total) by mouth every evening. 08/30/21   Shamleffer, Konrad Dolores, MD  ?carvedilol (COREG) 25 MG tablet Take 1 tablet (25 mg total) by mouth 2 (two) times daily. 12/18/20 12/18/21  Albertine Grates, MD  ?Cholecalciferol (VITAMIN D PO) Take 1 capsule by mouth daily.    [provider]   ?clotrimazole (GYNE-LOTRIMIN) 1 % vaginal cream Place 1 Applicatorful vaginally daily as needed (yeast).    [provider]  ?Diclofenac Sodium 3 % GEL Apply 2 g topically 2 (two) times daily as needed. 04/10/20   Nestor Ramp, MD  ?DILT-XR 240 MG 24 hr capsule Take 240 mg by mouth daily. 02/18/21   [provider]  ?ezetimibe (ZETIA) 10 MG tablet Take 10 mg by mouth at bedtime. 08/21/19   [provider]  ?furosemide (LASIX) 80 MG tablet Take 1 tablet (80 mg total) by mouth 2 (two) times daily. 12/18/20   Albertine Grates, MD  ?ibuprofen (ADVIL) 200 MG tablet Take 400 mg by mouth every 4 (four) hours as needed for fever, headache or moderate pain.     [provider]  ?insulin aspart (NOVOLOG FLEXPEN) 100 UNIT/ML FlexPen Inject 20 Units into the skin 3 (three) times daily with meals. 03/10/21   Shamleffer, Konrad Dolores, MD  ?insulin glargine (LANTUS SOLOSTAR) 100 UNIT/ML Solostar Pen Inject 60 Units into the skin daily. 08/27/21   Shamleffer, Konrad Dolores, MD  ?Insulin Pen Needle 32G X 4 MM MISC 1 Device by Does not apply route as directed. 03/26/20   Shamleffer, Konrad Dolores, MD  ?losartan (COZAAR) 100 MG tablet Take 1 tablet (100 mg total) by mouth daily. 08/30/21   Shamleffer, Konrad Dolores, MD  ?magnesium oxide (MAG-OX) 400 MG tablet Take 1 tablet (400 mg total) by mouth daily. 01/03/20   Little Ishikawa, MD  ?metFORMIN (GLUCOPHAGE) 500 MG tablet Take 1 tablet (500 mg total) by mouth 2 (two) times daily with a meal. 03/10/21   Shamleffer, Konrad Dolores, MD  ?metoprolol succinate (TOPROL-XL) 50 MG 24 hr tablet Take 50 mg by mouth daily. 02/18/21   [provider]  ?nystatin (MYCOSTATIN/NYSTOP) powder Apply 1 application topically daily as needed (yeast).    [provider]  ?polyethylene glycol (MIRALAX / GLYCOLAX) 17 g packet Take 17 g by mouth daily.    [provider]  ?potassium chloride SA (KLOR-CON) 20 MEQ tablet Take 1 tablet (20 mEq total)  by mouth daily. 12/18/20 03/18/21  Albertine Grates, MD  ?Sennosides (EX-LAX) 15 MG TABS Take 15 mg by mouth daily as needed (constipation).    [provider]  ?tamsulosin (FLOMAX) 0.4 MG CAPS capsule Take 0.4 mg by mouth daily.    [provider]  ?torsemide (DEMADEX) 20 MG tablet Take 40 mg by mouth daily. 08/16/21   [provider]  ? ? ?Physical Exam: ?Vitals:  ? 08/30/21 1858 08/30/21 2045 08/30/21 2109 08/30/21 2130  ?BP: 136/77 (!) 155/87 (!) 149/76 (!) 147/82  ?Pulse: 98 100 98 98  ?Resp: 20 (!) 21 20 (!) 21  ?Temp:      ?TempSrc:      ?SpO2: 90% 90% 98% 98%  ?Weight: 108.9 kg     ?Height: 5' 3.5" (1.613 m)     ? ?Constitutional: Morbidly obese woman resting in bed with head elevated, NAD, calm, comfortable ?Eyes: PERRL, lids and  conjunctivae normal ?ENMT: Mucous membranes are dry. Posterior pharynx clear of any exudate or lesions.Normal dentition.  Voice is hoarse. ?Neck: normal, supple, no masses. ?Respiratory: clear to auscultation anteriorly. Normal respiratory effort. No accessory muscle use.  ?Cardiovascular: Regular rate and rhythm, no murmurs / rubs / gallops.  +1 bilateral lower extremity edema. 2+ pedal pulses. ?Abdomen: no tenderness, no masses palpated. No hepatosplenomegaly.  ?Musculoskeletal: no clubbing / cyanosis. No joint deformity upper and lower extremities. Good ROM, no contractures. Normal muscle tone.  ?Skin: no rashes, lesions, ulcers. No induration ?Neurologic: CN 2-12 grossly intact. Sensation intact. Strength largely 5/5 in all extremities although with easy fatigability against resistance ?Psychiatric: Alert and oriented x 3. Normal mood.  ? ?EKG: Personally reviewed. Sinus tachycardia, rate 102, no acute ischemic changes.  Similar to prior. ? ?Assessment/Plan ?Principal Problem: ?  Uncontrolled type 2 diabetes mellitus with hyperglycemia, with long-term current use of insulin (HCC) ?Active Problems: ?  AKI (acute kidney injury) (HCC) ?  Elevated troponin ?   Generalized weakness ?  (HFpEF) heart failure with preserved ejection fraction (HCC) ?  Hypertension associated with diabetes (HCC) ?  Mixed diabetic hyperlipidemia associated with type 2 diabetes mellitus (HCC) ?  ?Drinda Butts

## 2021-08-31 ENCOUNTER — Observation Stay (HOSPITAL_COMMUNITY): Payer: Medicare Other

## 2021-08-31 DIAGNOSIS — I152 Hypertension secondary to endocrine disorders: Secondary | ICD-10-CM | POA: Diagnosis present

## 2021-08-31 DIAGNOSIS — Z79899 Other long term (current) drug therapy: Secondary | ICD-10-CM | POA: Diagnosis not present

## 2021-08-31 DIAGNOSIS — E782 Mixed hyperlipidemia: Secondary | ICD-10-CM | POA: Diagnosis present

## 2021-08-31 DIAGNOSIS — I517 Cardiomegaly: Secondary | ICD-10-CM

## 2021-08-31 DIAGNOSIS — N390 Urinary tract infection, site not specified: Secondary | ICD-10-CM | POA: Diagnosis present

## 2021-08-31 DIAGNOSIS — Z794 Long term (current) use of insulin: Secondary | ICD-10-CM | POA: Diagnosis not present

## 2021-08-31 DIAGNOSIS — G9340 Encephalopathy, unspecified: Secondary | ICD-10-CM | POA: Diagnosis present

## 2021-08-31 DIAGNOSIS — E1165 Type 2 diabetes mellitus with hyperglycemia: Secondary | ICD-10-CM | POA: Diagnosis present

## 2021-08-31 DIAGNOSIS — Z7982 Long term (current) use of aspirin: Secondary | ICD-10-CM | POA: Diagnosis not present

## 2021-08-31 DIAGNOSIS — R531 Weakness: Secondary | ICD-10-CM | POA: Diagnosis present

## 2021-08-31 DIAGNOSIS — Z6841 Body Mass Index (BMI) 40.0 and over, adult: Secondary | ICD-10-CM | POA: Diagnosis not present

## 2021-08-31 DIAGNOSIS — I110 Hypertensive heart disease with heart failure: Secondary | ICD-10-CM | POA: Diagnosis present

## 2021-08-31 DIAGNOSIS — I5032 Chronic diastolic (congestive) heart failure: Secondary | ICD-10-CM | POA: Diagnosis present

## 2021-08-31 DIAGNOSIS — E1169 Type 2 diabetes mellitus with other specified complication: Secondary | ICD-10-CM | POA: Diagnosis present

## 2021-08-31 DIAGNOSIS — L89312 Pressure ulcer of right buttock, stage 2: Secondary | ICD-10-CM | POA: Diagnosis present

## 2021-08-31 DIAGNOSIS — Z833 Family history of diabetes mellitus: Secondary | ICD-10-CM | POA: Diagnosis not present

## 2021-08-31 DIAGNOSIS — Z20822 Contact with and (suspected) exposure to covid-19: Secondary | ICD-10-CM | POA: Diagnosis present

## 2021-08-31 DIAGNOSIS — J069 Acute upper respiratory infection, unspecified: Secondary | ICD-10-CM | POA: Diagnosis present

## 2021-08-31 DIAGNOSIS — Z7984 Long term (current) use of oral hypoglycemic drugs: Secondary | ICD-10-CM | POA: Diagnosis not present

## 2021-08-31 DIAGNOSIS — N179 Acute kidney failure, unspecified: Secondary | ICD-10-CM | POA: Diagnosis present

## 2021-08-31 LAB — TSH: TSH: 1.306 u[IU]/mL (ref 0.350–4.500)

## 2021-08-31 LAB — AMMONIA: Ammonia: 41 umol/L — ABNORMAL HIGH (ref 9–35)

## 2021-08-31 LAB — BASIC METABOLIC PANEL
Anion gap: 13 (ref 5–15)
BUN: 28 mg/dL — ABNORMAL HIGH (ref 8–23)
CO2: 24 mmol/L (ref 22–32)
Calcium: 7.9 mg/dL — ABNORMAL LOW (ref 8.9–10.3)
Chloride: 97 mmol/L — ABNORMAL LOW (ref 98–111)
Creatinine, Ser: 1.22 mg/dL — ABNORMAL HIGH (ref 0.44–1.00)
GFR, Estimated: 49 mL/min — ABNORMAL LOW (ref >60)
Glucose, Bld: 302 mg/dL — ABNORMAL HIGH (ref 70–99)
Potassium: 3.7 mmol/L (ref 3.5–5.1)
Sodium: 134 mmol/L — ABNORMAL LOW (ref 135–145)

## 2021-08-31 LAB — LACTIC ACID, PLASMA
Lactic Acid, Venous: 1.3 mmol/L (ref 0.5–1.9)
Lactic Acid, Venous: 1.6 mmol/L (ref 0.5–1.9)

## 2021-08-31 LAB — CBC
HCT: 39.9 % (ref 36.0–46.0)
Hemoglobin: 12.9 g/dL (ref 12.0–15.0)
MCH: 27.6 pg (ref 26.0–34.0)
MCHC: 32.3 g/dL (ref 30.0–36.0)
MCV: 85.4 fL (ref 80.0–100.0)
Platelets: 142 10*3/uL — ABNORMAL LOW (ref 150–400)
RBC: 4.67 MIL/uL (ref 3.87–5.11)
RDW: 14 % (ref 11.5–15.5)
WBC: 8.3 10*3/uL (ref 4.0–10.5)
nRBC: 0 % (ref 0.0–0.2)

## 2021-08-31 LAB — GLUCOSE, CAPILLARY
Glucose-Capillary: 252 mg/dL — ABNORMAL HIGH (ref 70–99)
Glucose-Capillary: 255 mg/dL — ABNORMAL HIGH (ref 70–99)
Glucose-Capillary: 273 mg/dL — ABNORMAL HIGH (ref 70–99)
Glucose-Capillary: 294 mg/dL — ABNORMAL HIGH (ref 70–99)
Glucose-Capillary: 333 mg/dL — ABNORMAL HIGH (ref 70–99)

## 2021-08-31 LAB — ECHOCARDIOGRAM COMPLETE
Area-P 1/2: 5.13 cm2
Height: 63.5 in
S' Lateral: 3.8 cm
Single Plane A4C EF: 34.4 %
Weight: 3840 oz

## 2021-08-31 LAB — MAGNESIUM: Magnesium: 1.7 mg/dL (ref 1.7–2.4)

## 2021-08-31 LAB — PROCALCITONIN: Procalcitonin: 0.15 ng/mL

## 2021-08-31 MED ORDER — LACTULOSE ENEMA
300.0000 mL | Freq: Three times a day (TID) | ORAL | Status: AC
  Filled 2021-08-31 (×2): qty 300

## 2021-08-31 MED ORDER — FLUCONAZOLE 150 MG PO TABS
150.0000 mg | ORAL_TABLET | Freq: Once | ORAL | Status: DC
  Filled 2021-08-31: qty 1

## 2021-08-31 MED ORDER — SODIUM CHLORIDE 0.9 % IV SOLN
1.0000 g | INTRAVENOUS | Status: DC
  Administered 2021-08-31 – 2021-09-02 (×3): 1 g via INTRAVENOUS
  Filled 2021-08-31 (×3): qty 10

## 2021-08-31 MED ORDER — MUSCLE RUB 10-15 % EX CREA
1.0000 "application " | TOPICAL_CREAM | CUTANEOUS | Status: DC | PRN
  Filled 2021-08-31: qty 85

## 2021-08-31 MED ORDER — POLYVINYL ALCOHOL 1.4 % OP SOLN
1.0000 [drp] | OPHTHALMIC | Status: DC | PRN
Start: 2021-08-31 — End: 2021-09-03
  Filled 2021-08-31: qty 15

## 2021-08-31 MED ORDER — SALINE SPRAY 0.65 % NA SOLN
1.0000 | NASAL | Status: DC | PRN
Start: 2021-08-31 — End: 2021-09-03
  Filled 2021-08-31: qty 44

## 2021-08-31 MED ORDER — NYSTATIN 100000 UNIT/GM EX POWD
Freq: Three times a day (TID) | CUTANEOUS | Status: DC
  Filled 2021-08-31: qty 15

## 2021-08-31 MED ORDER — LIP MEDEX EX OINT
1.0000 "application " | TOPICAL_OINTMENT | CUTANEOUS | Status: DC | PRN
  Filled 2021-08-31: qty 7

## 2021-08-31 MED ORDER — KETOROLAC TROMETHAMINE 30 MG/ML IJ SOLN
30.0000 mg | Freq: Once | INTRAMUSCULAR | Status: AC
  Administered 2021-08-31: 30 mg via INTRAVENOUS
  Filled 2021-08-31: qty 1

## 2021-08-31 MED ORDER — PHENOL 1.4 % MT LIQD
1.0000 | OROMUCOSAL | Status: DC | PRN
Start: 2021-08-31 — End: 2021-09-03

## 2021-08-31 MED ORDER — LACTULOSE 10 GM/15ML PO SOLN: 30.0000 g | Freq: Three times a day (TID) | ORAL | Status: DC

## 2021-08-31 MED ORDER — POLYVINYL ALCOHOL 1.4 % OP SOLN
1.0000 [drp] | OPHTHALMIC | Status: DC | PRN
Start: 2021-08-31 — End: 2021-08-31
  Filled 2021-08-31: qty 15

## 2021-08-31 MED ORDER — HYDROCORTISONE (PERIANAL) 2.5 % EX CREA
1.0000 "application " | TOPICAL_CREAM | Freq: Four times a day (QID) | CUTANEOUS | Status: DC | PRN
  Filled 2021-08-31: qty 28.35

## 2021-08-31 MED ORDER — LACTULOSE 10 GM/15ML PO SOLN
30.0000 g | Freq: Three times a day (TID) | ORAL | Status: AC
  Administered 2021-08-31 (×3): 30 g via ORAL
  Filled 2021-08-31 (×3): qty 45

## 2021-08-31 MED ORDER — HYDROCORTISONE 1 % EX CREA
1.0000 "application " | TOPICAL_CREAM | Freq: Three times a day (TID) | CUTANEOUS | Status: DC | PRN
  Filled 2021-08-31: qty 28

## 2021-08-31 MED ORDER — IPRATROPIUM-ALBUTEROL 0.5-2.5 (3) MG/3ML IN SOLN
3.0000 mL | RESPIRATORY_TRACT | Status: DC | PRN
Start: 2021-08-31 — End: 2021-09-03

## 2021-08-31 MED ORDER — FUROSEMIDE 10 MG/ML IJ SOLN
40.0000 mg | Freq: Two times a day (BID) | INTRAMUSCULAR | Status: DC
  Administered 2021-08-31 – 2021-09-01 (×3): 40 mg via INTRAVENOUS
  Filled 2021-08-31 (×3): qty 4

## 2021-08-31 MED ORDER — ALUM & MAG HYDROXIDE-SIMETH 200-200-20 MG/5ML PO SUSP: 30.0000 mL | ORAL | Status: DC | PRN

## 2021-08-31 MED ORDER — LIP MEDEX EX OINT
1.0000 | TOPICAL_OINTMENT | CUTANEOUS | Status: DC | PRN
Start: 2021-08-31 — End: 2021-08-31
  Filled 2021-08-31: qty 7

## 2021-08-31 MED ORDER — METOPROLOL TARTRATE 5 MG/5ML IV SOLN: 5.0000 mg | INTRAVENOUS | Status: DC | PRN

## 2021-08-31 MED ORDER — HYDRALAZINE HCL 20 MG/ML IJ SOLN: 10.0000 mg | INTRAMUSCULAR | Status: DC | PRN

## 2021-08-31 MED ORDER — LORATADINE 10 MG PO TABS: 10.0000 mg | ORAL_TABLET | Freq: Every day | ORAL | Status: DC | PRN

## 2021-08-31 MED ORDER — TRAZODONE HCL 50 MG PO TABS
50.0000 mg | ORAL_TABLET | Freq: Every evening | ORAL | Status: DC | PRN
Start: 2021-08-31 — End: 2021-09-03

## 2021-08-31 MED ORDER — LIVING WELL WITH DIABETES BOOK
Freq: Once | Status: AC
  Filled 2021-08-31: qty 1

## 2021-08-31 NOTE — Assessment & Plan Note (Addendum)
Ucx- multiple species. Empiric IV Roc > transition to PO keflex.  ?Concerns of yeast infection.  Fluconazole p.o.once given 3/22. Nystatin powder. ?

## 2021-08-31 NOTE — Plan of Care (Signed)
°  Problem: Clinical Measurements: °Goal: Will remain free from infection °Outcome: Progressing °  °Problem: Activity: °Goal: Risk for activity intolerance will decrease °Outcome: Progressing °  °Problem: Nutrition: °Goal: Adequate nutrition will be maintained °Outcome: Progressing °  °Problem: Elimination: °Goal: Will not experience complications related to bowel motility °Outcome: Progressing °Goal: Will not experience complications related to urinary retention °Outcome: Progressing °  °

## 2021-08-31 NOTE — Assessment & Plan Note (Addendum)
Admission creatinine 1.4, gradually improving.  On metformin and losartan on hold.  Creatinine today 1.04 ?Baseline creatinine 0.8 ?Repeat outptn lab work with pcp in one week ? ? ?

## 2021-08-31 NOTE — Progress Notes (Signed)
?PROGRESS NOTE ? ? ? ?Whitney Sloan  [id number] DOB: 19-Oct-1955 DOA: 08/30/2021 ?PCP: Camillia Herter, NP  ? ?Brief Narrative:  ?66 year old with history of diastolic CHF EF [id number], grade 2 DD, insulin-dependent DM 2, HTN, HLD, morbid obesity comes to the hospital with complains of nausea, vomiting, cough and sore throat.  SHe has generally been feeling weak over the past 3 days prior to admission.  Typically uses walker/cane to ambulate but unable to do so.  Upon admission CT head was negative, she was hyperglycemic, COVID/flu negative.  She was found to be in acute kidney injury with uncontrolled DM 2. ? ? ?Assessment & Plan: ? Principal Problem: ?  Uncontrolled type 2 diabetes mellitus with hyperglycemia, with long-term current use of insulin (HCC) ?Active Problems: ?  UTI (urinary tract infection) ?  AKI (acute kidney injury) ([place]) ?  Elevated troponin ?  Generalized weakness ?  (HFpEF) heart failure with preserved ejection fraction ([place]) ?  Hypertension associated with diabetes ([place]) ?  Mixed diabetic hyperlipidemia associated with type 2 diabetes mellitus ([place]) ?  Acute encephalopathy ?  ? ? ?Assessment and Plan: ?* Uncontrolled type 2 diabetes mellitus with hyperglycemia, with long-term current use of insulin ([place]) ?Hyperglycemic without evidence of DKA/HHS in setting of nonadherence to insulin and dietary indiscretion.  Hemoglobin A1c >15 on 3/17. ?-Hold home metformin ?-Resume Semglee 60 units daily, NovoLog 20 units 3 times daily AC, add SSI ? ?UTI (urinary tract infection) ?Follow up Cx. Empiric IV Roc.  ?Concerns of yeast infec. Fluconazole ordered.  ? ?AKI (acute kidney injury) ([place]) ?Admission creatinine 1.4, gradually improving.  Holding off on home metformin and losartan.  Continue to closely monitor this. ?Baseline creatinine 0.8 ? ? ? ?Generalized weakness ?Secondary to deconditioning, body habitus, viral URI. ?-Request PT/OT eval ? ?Elevated troponin ?hronically elevated, currently chest  pain-free.  We will repeat her echocardiogram. ?Had abnormal stress back in July 2021 but she had declined left heart catheterization. ? ? ? ? ?(HFpEF) heart failure with preserved ejection fraction ([place]) ?Due to obesity difficult to assess volume status.  Did receive IV bolus in the ED.  Continue to closely monitor this.  Lasix 40mg  IV bid (IV due to nausea).  Strictly monitor input and output.  Previous echocardiogram showed EF of 50%, grade 2 DD ? ? ? ?Hypertension associated with diabetes ([place]) ?Resume home Coreg 25 mg twice daily, diltiazem to 40 mg twice daily, Lasix BID.  Hold losartan in setting of AKI. ? ?Mixed diabetic hyperlipidemia associated with type 2 diabetes mellitus ([place]) ?Continue atorvastatin and Zetia. ? ?Acute encephalopathy ?Likely from UTI, no focal neuro deficits. Check Ammonia TSH ? ? ? ?DVT prophylaxis: Lovenox ?Code Status: Full Code ?Family Communication:  Brooke updated.  ? ?Maintain hospital stay due to persistent acute kidney injury. ? ? ?  ? ?  ? ?Body mass index is 41.85 kg/m?. ? ?  ? ? ? ? ? ? ? ?Subjective: ?Feels very tired during my visit, easily arousable. She as also nauseous.  ?She was sitting in the chair earlier today  ? ? ?Review of Systems ?Otherwise negative except as per HPI, including: ?General: Denies fever, chills, night sweats or unintended weight loss. ?Resp: Denies cough, wheezing, shortness of breath. ?Cardiac: Denies chest pain, palpitations, orthopnea, paroxysmal nocturnal dyspnea. ?GI: Denies abdominal pain, nausea, vomiting, diarrhea or constipation ?GU: Denies dysuria, frequency, hesitancy or incontinence ?MS: Denies muscle aches, joint pain or swelling ?Neuro: Denies headache, neurologic deficits (focal weakness, numbness, tingling), abnormal gait ?  Psych: Denies anxiety, depression, SI/HI/AVH ?Skin: Denies new rashes or lesions ?ID: Denies sick contacts, exotic exposures, travel ? ?Examination: ? ?General exam: Appears calm and comfortable  ?Respiratory  system: diminished b/l diffusely.  ?Cardiovascular system: S1 & S2 heard, RRR. No JVD, murmurs, rubs, gallops or clicks. 2+ b/l LE edema ?Gastrointestinal system: Abdomen is nondistended, soft and nontender. No organomegaly or masses felt. Normal bowel sounds heard. ?Central nervous system: Alert and oriented. No focal neurological deficits. ?Extremities: Symmetric 5 x 5 power. ?Skin: No rashes, lesions or ulcers ?Psychiatry: Judgement and insight appear normal. Mood & affect appropriate.  ? ? ? ?Objective: ?Vitals:  ? 08/31/21 0100 08/31/21 0444 08/31/21 0900 08/31/21 1107  ?BP: (!) 117/35 (!) 156/91 137/79 (!) 157/89  ?Pulse:  99 (!) 102 98  ?Resp:  20 11 18   ?Temp: 97.8 ?F (36.6 ?C) 99.2 ?F (37.3 ?C)  (!) 101.9 ?F (38.8 ?C)  ?TempSrc: Oral Oral  Oral  ?SpO2:  96% 92% 92%  ?Weight:      ?Height:      ? ? ?Intake/Output Summary (Last 24 hours) at 08/31/2021 1129 ?Last data filed at 08/31/2021 0448 ?Gross per 24 hour  ?Intake 740.11 ml  ?Output 50 ml  ?Net 690.11 ml  ? ?Filed Weights  ? 08/30/21 1858  ?Weight: 108.9 kg  ? ? ? ?Data Reviewed:  ? ?CBC: ?Recent Labs  ?Lab 08/30/21 ?1920 08/30/21 ?1944 08/31/21 ?0144  ?WBC 7.2  --  8.3  ?NEUTROABS 4.3  --   --   ?HGB 12.5 14.3 12.9  ?HCT 40.3 42.0 39.9  ?MCV 87.2  --  85.4  ?PLT 189  --  142*  ? ?Basic Metabolic Panel: ?Recent Labs  ?Lab 08/27/21 ?1404 08/30/21 ?1920 08/30/21 ?1944 08/31/21 ?0144  ?NA 139 131* 132* 134*  ?K 3.8 4.2 4.0 3.7  ?CL 98 93* 94* 97*  ?CO2 34* 25  --  24  ?GLUCOSE 429* 461* 468* 302*  ?BUN 9 30* 35* 28*  ?CREATININE 0.83 1.39* 1.40* 1.22*  ?CALCIUM 9.1 8.2*  --  7.9*  ?MG  --   --   --  1.7  ? ?GFR: ?Estimated Creatinine Clearance: 54.2 mL/min (A) (by C-G formula based on SCr of 1.22 mg/dL (H)). ?Liver Function Tests: ?Recent Labs  ?Lab 08/30/21 ?1920  ?AST 26  ?ALT 14  ?ALKPHOS 52  ?BILITOT 0.7  ?PROT 6.7  ?ALBUMIN 2.4*  ? ?No results for input(s): LIPASE, AMYLASE in the last 168 hours. ?No results for input(s): AMMONIA in the last 168  hours. ?Coagulation Profile: ?Recent Labs  ?Lab 08/30/21 ?1920  ?INR 1.1  ? ?Cardiac Enzymes: ?Recent Labs  ?Lab 08/30/21 ?1920  ?CKTOTAL 136  ? ?BNP (last 3 results) ?No results for input(s): PROBNP in the last 8760 hours. ?HbA1C: ?No results for input(s): HGBA1C in the last 72 hours. ?CBG: ?Recent Labs  ?Lab 08/30/21 ?2041 08/30/21 ?2226 08/31/21 ?1517 08/31/21 ?0630 08/31/21 ?1103  ?GLUCAP 412* 313* 273* 333* 294*  ? ?Lipid Profile: ?No results for input(s): CHOL, HDL, LDLCALC, TRIG, CHOLHDL, LDLDIRECT in the last 72 hours. ?Thyroid Function Tests: ?No results for input(s): TSH, T4TOTAL, FREET4, T3FREE, THYROIDAB in the last 72 hours. ?Anemia Panel: ?No results for input(s): VITAMINB12, FOLATE, FERRITIN, TIBC, IRON, RETICCTPCT in the last 72 hours. ?Sepsis Labs: ?Recent Labs  ?Lab 08/30/21 ?1920 08/30/21 ?2251  ?LATICACIDVEN 2.3* 3.0*  ? ? ?Recent Results (from the past 240 hour(s))  ?Culture, blood (routine x 2)     Status: None (Preliminary result)  ?  Collection Time: 08/30/21  7:20 PM  ? Specimen: BLOOD  ?Result Value Ref Range Status  ? Specimen Description BLOOD LEFT ANTECUBITAL  Final  ? Special Requests   Final  ?  BOTTLES DRAWN AEROBIC AND ANAEROBIC Blood Culture adequate volume  ? Culture   Final  ?  NO GROWTH < 12 HOURS ?Performed at [facility], [place] [address], [place], [place] [address] ?  ? Report Status PENDING  Incomplete  ?Culture, blood (routine x 2)     Status: None (Preliminary result)  ? Collection Time: 08/30/21  7:35 PM  ? Specimen: BLOOD  ?Result Value Ref Range Status  ? Specimen Description BLOOD RIGHT ANTECUBITAL  Final  ? Special Requests   Final  ?  BOTTLES DRAWN AEROBIC AND ANAEROBIC Blood Culture adequate volume  ? Culture   Final  ?  NO GROWTH < 12 HOURS ?Performed at [facility], [place] [address] [place], [place] [address] ?  ? Report Status PENDING  Incomplete  ?Resp Panel by RT-PCR (Flu A&B, Covid) Nasopharyngeal Swab     Status: None  ? Collection Time: 08/30/21   8:58 PM  ? Specimen: Nasopharyngeal Swab; Nasopharyngeal(NP) swabs in vial transport medium  ?Result Value Ref Range Status  ? SARS Coronavirus 2 by RT PCR NEGATIVE NEGATIVE Final  ?  Comment: (NOTE) ?SARS-CoV-2 target nucl

## 2021-08-31 NOTE — Evaluation (Addendum)
Physical Therapy Evaluation ?Patient Details ?Name: Whitney Sloan ?MRN: [id number] ?DOB: 23-Jan-1956 ?Today's Date: 08/31/2021 ? ?History of Present Illness ? Pt is a 66 y.o. F who presents 08/30/2021 for evaluation of nausea, vomiting cough, sore throat, generalized weakness. Chest x-ray shows chronic enlarged cardiac silhouette with bibasilar atelectasis. CT head negative for acute abnormality. Significant PMH: diastolic CHF, insulin dependent T2DM, HTN, HLD, morbid obesity.  ?Clinical Impression ? PTA, pt lives with her daughter in a level entry apartment. Pt reports the past 3 days, she has been unable to stand from the couch; has been using pads for toileting. Pt presents with decreased functional mobility secondary to generalized weakness, impaired balance, decreased activity tolerance, and cognitive impairments. Pt requiring min-mod assist for transfers and ambulating ~5 ft with a walker. HR 100-123 bpm, SpO2 88-92% on RA, BP 137/79 (95). Highly recommend post acute rehab to address deficits, maximize functional mobility and decrease caregiver burden.  ?   ? ?Recommendations for follow up therapy are one component of a multi-disciplinary discharge planning process, led by the attending physician.  Recommendations may be updated based on patient status, additional functional criteria and insurance authorization. ? ?Follow Up Recommendations Skilled nursing-short term rehab (<3 hours/day) ? ?  ?Assistance Recommended at Discharge Frequent or constant Supervision/Assistance  ?Patient can return home with the following ? A lot of help with walking and/or transfers;A little help with bathing/dressing/bathroom;Assistance with cooking/housework;Direct supervision/assist for medications management;Assist for transportation ? ?  ?Equipment Recommendations BSC/3in1;Wheelchair (measurements PT);Wheelchair cushion (measurements PT)  ?Recommendations for Other Services ?    ?  ?Functional Status Assessment Patient has had a  recent decline in their functional status and demonstrates the ability to make significant improvements in function in a reasonable and predictable amount of time.  ? ?  ?Precautions / Restrictions Precautions ?Precautions: Fall;Other (comment) ?Precaution Comments: watch O2 ?Restrictions ?Weight Bearing Restrictions: No  ? ?  ? ?Mobility ? Bed Mobility ?Overal bed mobility: Needs Assistance ?Bed Mobility: Supine to Sit ?  ?  ?Supine to sit: Mod assist ?  ?  ?General bed mobility comments: Pt initiating moving BLE's to edge of bed with cues, requiring assist to scoot hips forward with bed pad, and trunk assist to upright ?  ? ?Transfers ?Overall transfer level: Needs assistance ?Equipment used: Rolling walker (2 wheels) ?Transfers: Sit to/from Stand, Bed to chair/wheelchair/BSC ?Sit to Stand: Mod assist, +2 physical assistance, Min assist ?Stand pivot transfers: Min assist, +2 safety/equipment ?  ?  ?  ?  ?General transfer comment: Pt initially requiring modA to rise from edge of bed, pulling up on RW to stand. Progressing to minA from [facility], cues for hand/ foot placement, rocking forward to gain momentum ?  ? ?Ambulation/Gait ?Ambulation/Gait assistance: Min assist, +2 safety/equipment ?Gait Distance (Feet): 5 Feet ?Assistive device: Rolling walker (2 wheels) ?Gait Pattern/deviations: Step-through pattern, Decreased stride length ?Gait velocity: decreased ?Gait velocity interpretation: <1.31 ft/sec, indicative of household ambulator ?  ?General Gait Details: Pt ambulating ~5 ft from [facility] to recliner, cues for sequencing/direction, minA for balance ? ?Stairs ?  ?  ?  ?  ?  ? ?Wheelchair Mobility ?  ? ?Modified Rankin (Stroke Patients Only) ?  ? ?  ? ?Balance Overall balance assessment: Needs assistance ?Sitting-balance support: Feet supported ?Sitting balance-Leahy Scale: Fair ?  ?  ?Standing balance support: Bilateral upper extremity supported ?Standing balance-Leahy Scale: Poor ?Standing balance comment: reliant on  RW ?  ?  ?  ?  ?  ?  ?  ?  ?  ?  ?  ?   ? ? ? ?  Pertinent Vitals/Pain Pain Assessment ?Pain Assessment: No/denies pain  ? ? ?Home Living Family/patient expects to be discharged to:: Private residence ?Living Arrangements: Children (daughter) ?Available Help at Discharge: Family;Available PRN/intermittently ?Type of Home: Apartment ?Home Access: Level entry ?  ?  ?  ?Home Layout: One level ?Home Equipment: Conservation officer, nature (2 wheels);Tub bench ?   ?  ?Prior Function Prior Level of Function : Needs assist ?  ?  ?  ?  ?  ?  ?Mobility Comments: household ambulator ?ADLs Comments: pt reports modI, however, question reliability\ ?  ? ? ?Hand Dominance  ? Dominant Hand: Right ? ?  ?Extremity/Trunk Assessment  ? Upper Extremity Assessment ?Upper Extremity Assessment: Defer to OT evaluation ?  ? ?Lower Extremity Assessment ?Lower Extremity Assessment: Generalized weakness ?  ? ?   ?Communication  ? Communication: No difficulties  ?Cognition Arousal/Alertness: Awake/alert ?Behavior During Therapy: [facility] for tasks assessed/performed ?Overall Cognitive Status: Impaired/Different from baseline ?Area of Impairment: Memory, Following commands, Problem solving ?  ?  ?  ?  ?  ?  ?  ?  ?  ?  ?Memory: Decreased short-term memory ?Following Commands: Follows one step commands with increased time ?  ?  ?Problem Solving: Slow processing, Requires verbal cues, Difficulty sequencing, Decreased initiation ?General Comments: Pt A&Ox3, delayed processing and requires increased time to follow 1 step commands and respond to questions ?  ?  ? ?  ?General Comments  Open areas of breakdown/maceration noted along gluteal cleft, with pt reporting discomfort. RN present to apply fungal powder ? ?  ?Exercises    ? ?Assessment/Plan  ?  ?PT Assessment Patient needs continued PT services  ?PT Problem List Decreased strength;Decreased activity tolerance;Decreased balance;Decreased mobility;Decreased cognition;Decreased safety awareness;Obesity ? ?   ?  ?PT  Treatment Interventions DME instruction;Gait training;Functional mobility training;Therapeutic activities;Therapeutic exercise;Balance training;Patient/family education   ? ?PT Goals (Current goals can be found in the Care Plan section)  ?Acute Rehab PT Goals ?Patient Stated Goal: spend time with her daughter/grandkids ?PT Goal Formulation: With patient ?Time For Goal Achievement: 09/14/21 ?Potential to Achieve Goals: Good ? ?  ?Frequency Min 3X/week ?  ? ? ?Co-evaluation PT/OT/SLP Co-Evaluation/Treatment: Yes ?Reason for Co-Treatment: Necessary to address cognition/behavior during functional activity;For patient/therapist safety;To address functional/ADL transfers ?PT goals addressed during session: Mobility/safety with mobility;Balance;Proper use of DME;Strengthening/ROM ?OT goals addressed during session: ADL's and self-care;Proper use of Adaptive equipment and DME;Strengthening/ROM ?  ? ? ?  ?AM-PAC PT "6 Clicks" Mobility  ?Outcome Measure Help needed turning from your back to your side while in a flat bed without using bedrails?: A Little ?Help needed moving from lying on your back to sitting on the side of a flat bed without using bedrails?: A Lot ?Help needed moving to and from a bed to a chair (including a wheelchair)?: A Little ?Help needed standing up from a chair using your arms (e.g., wheelchair or bedside chair)?: A Lot ?Help needed to walk in hospital room?: A Lot ?Help needed climbing 3-5 steps with a railing? : Total ?6 Click Score: 13 ? ?  ?End of Session Equipment Utilized During Treatment: Gait belt ?Activity Tolerance: Patient tolerated treatment well ?Patient left: in chair;with call bell/phone within reach;with chair alarm set ?Nurse Communication: Mobility status ?PT Visit Diagnosis: Unsteadiness on feet (R26.81);Muscle weakness (generalized) (M62.81);Difficulty in walking, not elsewhere classified (R26.2) ?  ? ?Time: [phone] ?PT Time Calculation (min) (ACUTE ONLY): 31 min ? ? ?Charges:   PT  Evaluation ?$PT Eval Moderate Complexity: 1 Mod ?  ?  ?   ? ? ?  Wyona Almas, PT, DPT ?Acute Rehabilitation Services ?Pager [phone] ?Office [phone] ? ? ?Deno Etienne ?08/31/2021, 9:48 AM ? ?

## 2021-08-31 NOTE — Progress Notes (Signed)
Attempted to give several morning medications and patient choked while trying to swallow and then coughed/threw up all meds. Notified MD. New orders being placed.  ?Whitney Sloan  ?

## 2021-08-31 NOTE — Hospital Course (Signed)
Whitney Sloan is a 66 y.o. female with medical history significant for diastolic CHF (EF 16-10%, G2DD by TTE 10/31/2019), insulin-dependent T2DM, HTN, HLD, morbid obesity who is admitted with generalized weakness, hyperglycemia, AKI. ?

## 2021-08-31 NOTE — Assessment & Plan Note (Addendum)
Due to obesity difficult to assess volume status.  Did receive IV bolus in the ED. p.o. Lasix.   Echocardiogram shows EF of [id number], grade 2 diastolic dysfunction. ? ? ?

## 2021-08-31 NOTE — Assessment & Plan Note (Signed)
Continue atorvastatin and Zetia. 

## 2021-08-31 NOTE — Evaluation (Signed)
Clinical/Bedside Swallow Evaluation ?Patient Details  ?Name: LISL SLINGERLAND ?MRN: [id number] ?Date of Birth: Jun 01, 1956 ? ?Today's Date: 08/31/2021 ?Time: SLP Start Time (ACUTE ONLY): 1420 SLP Stop Time (ACUTE ONLY): 1435 ?SLP Time Calculation (min) (ACUTE ONLY): 15 min ? ?Past Medical History:  ?Past Medical History:  ?Diagnosis Date  ? Abnormal stress test 12/31/2019  ? medium defect of moderate severity present in the basal inferior, mid inferior and apex location.  Findings consistent with ischemia  ? Chronic diastolic (congestive) heart failure (HCC)   ? Diabetes mellitus   ? Hyperlipemia   ? Hypertension   ? Psoriasis   ? ?Past Surgical History:  ?Past Surgical History:  ?Procedure Laterality Date  ? ANAL FISSURE REPAIR    ? ENDOMETRIAL ABLATION    ? ?HPI:  ?Pt is a 66 y.o. F who presents 08/30/2021 for evaluation of nausea, vomiting cough, sore throat, generalized weakness. Chest x-ray shows chronic enlarged cardiac silhouette with bibasilar atelectasis. CT head negative for acute abnormality. Significant PMH: diastolic CHF, insulin dependent T2DM, HTN, HLD, morbid obesity. On 3/21, RN reported that patient "coughed/threw up all her meds", prompting this bedside/clinical swallow evaluation.  ?  ?Assessment / Plan / Recommendation  ?Clinical Impression ? Patient did not present with any clinical s/s of dysphagia as per this bedside swallow evaluation however it was limited by patient not able to maintain alertness enough for safe PO intake of solids. SLP observed patient drink thin liquids via straw sips as well as liquid medicaiton via cup sips. No overt s/s aspiration or penetration observed. SLP is recommending to continue on current diet and for SLP to follow up at least one more time to ensure toleration of diet. ?SLP Visit Diagnosis: Dysphagia, unspecified (R13.10) ?   ?Aspiration Risk ? Mild aspiration risk;No limitations  ?  ?Diet Recommendation Regular;Thin liquid  ? ?Liquid Administration via:  Cup;Straw ?Medication Administration: Whole meds with liquid ?Supervision: Patient able to self feed ?Compensations: Slow rate;Small sips/bites ?Postural Changes: Seated upright at 90 degrees  ?  ?Other  Recommendations Oral Care Recommendations: Oral care BID   ? ?Recommendations for follow up therapy are one component of a multi-disciplinary discharge planning process, led by the attending physician.  Recommendations may be updated based on patient status, additional functional criteria and insurance authorization. ? ?Follow up Recommendations No SLP follow up  ? ? ?  ?Assistance Recommended at Discharge None  ?Functional Status Assessment Patient has had a recent decline in their functional status and demonstrates the ability to make significant improvements in function in a reasonable and predictable amount of time.  ?Frequency and Duration min 1 x/week  ?1 week ?  ?   ? ?Prognosis Prognosis for Safe Diet Advancement: Good  ? ?  ? ?Swallow Study   ?General Date of Onset: 08/30/21 ?HPI: Pt is a 66 y.o. F who presents 08/30/2021 for evaluation of nausea, vomiting cough, sore throat, generalized weakness. Chest x-ray shows chronic enlarged cardiac silhouette with bibasilar atelectasis. CT head negative for acute abnormality. Significant PMH: diastolic CHF, insulin dependent T2DM, HTN, HLD, morbid obesity. On 3/21, RN reported that patient "coughed/threw up all her meds", prompting this bedside/clinical swallow evaluation. ?Type of Study: Bedside Swallow Evaluation ?Previous Swallow Assessment: none found ?Diet Prior to this Study: Regular;Thin liquids ?Temperature Spikes Noted: Yes (100.4) ?Respiratory Status: Nasal cannula ?History of Recent Intubation: No ?Behavior/Cognition: Cooperative;Lethargic/Drowsy;Requires cueing ?Oral Cavity Assessment: Within Functional Limits ?Oral Care Completed by SLP: No ?Oral Cavity - Dentition: Adequate natural dentition ?Vision: Functional  for self-feeding ?Self-Feeding Abilities:  Able to feed self ?Patient Positioning: Upright in bed ?Baseline Vocal Quality: Normal ?Volitional Cough: Strong ?Volitional Swallow: Able to elicit  ?  ?Oral/Motor/Sensory Function Overall Oral Motor/Sensory Function: Within functional limits   ?Ice Chips     ?Thin Liquid Thin Liquid: Within functional limits ?Presentation: Straw  ?  ?Nectar Thick     ?Honey Thick     ?Puree Puree: Not tested   ?Solid ? ? ?  Solid: Not tested  ? ?  ?Angela Nevin, MA, CCC-SLP ?Speech Therapy ? ? ? ? ?

## 2021-08-31 NOTE — Progress Notes (Signed)
Patient came back from Echo extremely lethargic and not easily awoken. Charge nurse notified and indicated to call rapid RN just to place eyes on the patient. Wilburn Cornelia, rapid RN came to bedside to see patient. Patient seemed to be febrile again. She assisted RN and NT administer tylenol suppository and place foley catheter. Patient labia swollen and yeast presence. Ice packs placed in groin area for fever. MD notified of foley placement- per his order. Will continue to monitor.  ?Whitney Sloan  ?

## 2021-08-31 NOTE — Progress Notes (Signed)
Inpatient Diabetes Program Recommendations ? ?AACE/ADA: New Consensus Statement on Inpatient Glycemic Control (2015) ? ?Target Ranges:  Prepandial:   less than 140 mg/dL ?     Peak postprandial:   less than 180 mg/dL (1-2 hours) ?     Critically ill patients:  140 - 180 mg/dL  ? ?Lab Results  ?Component Value Date  ? GLUCAP 294 (H) 08/31/2021  ? HGBA1C >15.0 08/27/2021  ? ? ?Review of Glycemic Control ? ?Diabetes history: DM2 ?Outpatient Diabetes medications: Lantus 60 units QD, Novolog 20 units BID, Metformin 1000 mg BID ?Current orders for Inpatient glycemic control: Semglee 60 units QD, Novolog 20 units TID, Novolog 0-9 units TID, Novolog 0-5 units QHS ? ?Spoke with patient and daughter at bedside.  She was not able to tell me the insulins or doses she takes.  She was able to tell me she takes Metformin.  She continues to drift off to sleep during our conversation.  Her daughter states she does not take her insulins.  Her schedule is off where she is eating dinner at 10-11 pm and stays up most of the night and sleeps all day.  She lives with another daughter who is not present at this time.  The daughter she lives with works.  Her daughter at bedside states she is worried about her being at home alone and needs some more assistance with her care.   ? ?She does not check her blood glucose.  Will come by again tomorrow if more alert to discuss DM regimen. ? ?Will continue to follow while inpatient. ? ?Thank you, ?Dulce Sellar, MSN, RN ?Diabetes Coordinator ?Inpatient Diabetes Program ?[phone] (team pager from 8a-5p) ? ? ? ? ? ?

## 2021-08-31 NOTE — Assessment & Plan Note (Addendum)
hronically elevated, currently chest pain-free.  Repeat echo shows EF 50%, grade 2 DD ?Had abnormal stress back in July 2021 but she had declined left heart catheterization. ? ? ? ?

## 2021-08-31 NOTE — Assessment & Plan Note (Addendum)
Resolved.  Likely from UTI but this has improved.  No focal neurodeficits.  TSH normal.  Ammonia was slightly elevated therefore received lactulose. Now normal ?

## 2021-08-31 NOTE — Evaluation (Signed)
Occupational Therapy Evaluation Patient Details Name: Whitney Sloan MRN: [id number] DOB: 05-01-1956 Today's Date: 08/31/2021   History of Present Illness Pt is a 66 y.o. F who presents 08/30/2021 for evaluation of nausea, vomiting cough, sore throat, generalized weakness. Chest x-ray shows chronic enlarged cardiac silhouette with bibasilar atelectasis. CT head negative for acute abnormality. Significant PMH: diastolic CHF, insulin dependent T2DM, HTN, HLD, morbid obesity.   Clinical Impression   Pt reports that she was Mod I with RW for transfers and mobility, and does her own ADL/IADL - but for the past 3 days has been too weak and unable to get off the couch. Pt presents today with decreased cognition, activity tolerance, access to LB for ADL (max A), ability to perform IADL, decreased ability for transfers and ambulation (mod A +2 safety). Pt will require continued skilled OT in the acute setting as well as afterwards at the SNF level to  maximize safety and independence in ADL and functional transfers.  Next session plan on AE education due to decreased access to LB for ADL and morbid obesity as well as OOB activity.     Recommendations for follow up therapy are one component of a multi-disciplinary discharge planning process, led by the attending physician.  Recommendations may be updated based on patient status, additional functional criteria and insurance authorization.   Follow Up Recommendations  Skilled nursing-short term rehab (<3 hours/day)    Assistance Recommended at Discharge Frequent or constant Supervision/Assistance  Patient can return home with the following A lot of help with walking and/or transfers;A lot of help with bathing/dressing/bathroom;Assistance with cooking/housework;Direct supervision/assist for medications management;Direct supervision/assist for financial management;Assist for transportation;Help with stairs or ramp for entrance    Functional Status Assessment   Patient has had a recent decline in their functional status and demonstrates the ability to make significant improvements in function in a reasonable and predictable amount of time.  Equipment Recommendations  BSC/3in1 (WIDE)    Recommendations for Other Services PT consult;Speech consult (cognition for SLP)     Precautions / Restrictions Precautions Precautions: Fall Precaution Comments: watch O2 Restrictions Weight Bearing Restrictions: No      Mobility Bed Mobility Overal bed mobility: Needs Assistance Bed Mobility: Supine to Sit     Supine to sit: Mod assist, +2 for safety/equipment     General bed mobility comments: Pt initiating moving BLE's to edge of bed with cues, requiring assist to scoot hips forward with bed pad, and trunk assist to upright vc for sequencing - pt needed physical and cog assist to complete    Transfers Overall transfer level: Needs assistance Equipment used: Rolling walker (2 wheels) Transfers: Sit to/from Stand, Bed to chair/wheelchair/BSC Sit to Stand: Mod assist, +2 physical assistance, Min assist Stand pivot transfers: Mod assist, +2 safety/equipment, Min assist         General transfer comment: Pt initially requiring modA to rise from edge of bed, pulling up on RW to stand. Progressing to minA from [facility], cues for hand/ foot placement, rocking forward to gain momentum      Balance Overall balance assessment: Needs assistance Sitting-balance support: Feet supported Sitting balance-Leahy Scale: Fair     Standing balance support: Bilateral upper extremity supported, Reliant on assistive device for balance Standing balance-Leahy Scale: Poor Standing balance comment: reliant on RW                           ADL either performed or assessed with  clinical judgement   ADL Overall ADL's : Needs assistance/impaired Eating/Feeding: Set up;Sitting   Grooming: Wash/dry face;Set up;Sitting   Upper Body Bathing: Moderate  assistance;Sitting   Lower Body Bathing: Maximal assistance;Sitting/lateral leans   Upper Body Dressing : Minimal assistance;Sitting   Lower Body Dressing: Maximal assistance;+2 for safety/equipment;+2 for physical assistance;Sit to/from stand Lower Body Dressing Details (indicate cue type and reason): could not problem solve how to don socks even with verbal cues Toilet Transfer: Moderate assistance;+2 for safety/equipment;Stand-pivot;BSC/3in1;Rolling walker (2 wheels) Toilet Transfer Details (indicate cue type and reason): assist for managing RW, trouble progressing RLE and vc for safe hand placements Toileting- Clothing Manipulation and Hygiene: Maximal assistance;+2 for safety/equipment;Sit to/from stand Toileting - Clothing Manipulation Details (indicate cue type and reason): able to  maintain standing with initial Mod A +2 safety and then stood for rear peri care including application of anti fungal powder on several sore spots on bottom     Functional mobility during ADLs: Moderate assistance;+2 for safety/equipment;Rolling walker (2 wheels) General ADL Comments: decreased activity tolerance, safety awareness, access to peri area and LB for ADL     Vision         Perception     Praxis      Pertinent Vitals/Pain Pain Assessment Pain Assessment: Faces Faces Pain Scale: Hurts little more Pain Location: bottom Pain Descriptors / Indicators: Discomfort, Grimacing Pain Intervention(s): Monitored during session, Repositioned, Other (comment) (extra pillows in recliner, cleaned-dried- and applied antifungal powder to bottom (RN assisted))     Hand Dominance Right   Extremity/Trunk Assessment Upper Extremity Assessment Upper Extremity Assessment: Generalized weakness   Lower Extremity Assessment Lower Extremity Assessment: Defer to PT evaluation   Cervical / Trunk Assessment Cervical / Trunk Assessment: Other exceptions Cervical / Trunk Exceptions: morbid obesity    Communication Communication Communication: No difficulties   Cognition Arousal/Alertness: Awake/alert Behavior During Therapy: WFL for tasks assessed/performed Overall Cognitive Status: Impaired/Different from baseline Area of Impairment: Memory, Following commands, Problem solving                     Memory: Decreased short-term memory Following Commands: Follows one step commands with increased time     Problem Solving: Slow processing, Requires verbal cues, Difficulty sequencing, Decreased initiation General Comments: Pt A&Ox3, delayed processing and requires increased time to follow 1 step commands and respond to questions, required cues for sequencing and problem solving tasks like getting to the EOB, unsure of how to start putting on socks etc     General Comments  Open areas of breakdown/maceration noted along gluteal cleft, with pt reporting discomfort. RN present to apply fungal powder; hair is very matted    Exercises     Shoulder Instructions      Home Living Family/patient expects to be discharged to:: Private residence Living Arrangements: Children (adult daughter (30)) Available Help at Discharge: Family;Available PRN/intermittently Type of Home: Apartment Home Access: Level entry     Home Layout: One level     Bathroom Shower/Tub: Chief Strategy Officer: Handicapped height     Home Equipment: Agricultural consultant (2 wheels);Tub bench          Prior Functioning/Environment Prior Level of Function : Independent/Modified Independent;History of Falls (last six months)             Mobility Comments: RW at baseline (unable the past 3 days ADLs Comments: pt reports modI, however, question reliability        OT Problem List: Decreased  strength;Decreased activity tolerance;Impaired balance (sitting and/or standing);Decreased cognition;Decreased safety awareness;Decreased knowledge of use of DME or AE;Obesity      OT  Treatment/Interventions: Self-care/ADL training;Energy conservation;DME and/or AE instruction;Therapeutic activities;Cognitive remediation/compensation;Patient/family education;Balance training    OT Goals(Current goals can be found in the care plan section) Acute Rehab OT Goals Patient Stated Goal: get back to moving better OT Goal Formulation: With patient Time For Goal Achievement: 09/14/21 Potential to Achieve Goals: Good ADL Goals Pt Will Perform Grooming: with supervision;standing Pt Will Perform Upper Body Dressing: with set-up;sitting Pt Will Perform Lower Body Dressing: with supervision;sit to/from stand;with adaptive equipment Pt Will Transfer to Toilet: with supervision;ambulating Pt Will Perform Toileting - Clothing Manipulation and hygiene: with supervision;with adaptive equipment;sit to/from stand Additional ADL Goal #1: Pt will demonstrate improved cognition for safety and executive functioning (like medicince management) by passing OT cognitive assessment  OT Frequency: Min 2X/week    Co-evaluation PT/OT/SLP Co-Evaluation/Treatment: Yes Reason for Co-Treatment: Necessary to address cognition/behavior during functional activity;For patient/therapist safety;To address functional/ADL transfers PT goals addressed during session: Mobility/safety with mobility;Balance;Proper use of DME;Strengthening/ROM OT goals addressed during session: ADL's and self-care;Proper use of Adaptive equipment and DME;Strengthening/ROM      AM-PAC OT "6 Clicks" Daily Activity     Outcome Measure Help from another person eating meals?: A Little Help from another person taking care of personal grooming?: A Little Help from another person toileting, which includes using toliet, bedpan, or urinal?: A Lot Help from another person bathing (including washing, rinsing, drying)?: A Lot Help from another person to put on and taking off regular upper body clothing?: A Little Help from another person to put  on and taking off regular lower body clothing?: A Lot 6 Click Score: 15   End of Session Equipment Utilized During Treatment: Gait belt;Rolling walker (2 wheels);Oxygen (1L at end of session) Nurse Communication: Mobility status;Precautions  Activity Tolerance: Patient tolerated treatment well Patient left: in chair;with call bell/phone within reach;with chair alarm set  OT Visit Diagnosis: Unsteadiness on feet (R26.81);Other abnormalities of gait and mobility (R26.89);Muscle weakness (generalized) (M62.81);History of falling (Z91.81);Other symptoms and signs involving cognitive function;Pain Pain - part of body:  (bottom)                Time: 2536-6440 OT Time Calculation (min): 31 min Charges:  OT General Charges $OT Visit: 1 Visit OT Evaluation $OT Eval Moderate Complexity: 1 Mod  Nyoka Cowden OTR/L Acute Rehabilitation Services Pager: [phone] Office: [phone]  Evern Bio Anyah Swallow 08/31/2021, 10:13 AM

## 2021-08-31 NOTE — Progress Notes (Signed)
?  Echocardiogram ?2D Echocardiogram has been performed. ? ?Whitney Sloan ?08/31/2021, 10:52 AM ?

## 2021-08-31 NOTE — Assessment & Plan Note (Addendum)
Hyperglycemic without evidence of DKA/HHS in setting of nonadherence to insulin and dietary indiscretion.  Hemoglobin A1c >15 on 3/17. ?Resume home meds, BS well controlled here in the hospital over last 48 hrs. Would encourage endocrine referral by PCP ?

## 2021-08-31 NOTE — Assessment & Plan Note (Addendum)
Resume all home meds

## 2021-08-31 NOTE — Progress Notes (Signed)
Rapid Response Progress Note  ? ?Asked to evaluate pt with lethargy following return from 2D ECHO. ? ?Pt lying in bed, drowsy. Oriented x4. Denies pain or distress. She is noted to be diaphoretic and skin is very warm. Per RN pt vomited earlier following oral medication administration. Pt was also febrile at 101.75F, but fever had since been reduced. Lung sounds are clear, bases are diminished. Abdomen is round, soft.  ? ?Pt received 30mg  IV Toradol at 1145 for fever 101.75F. Recheck was 98.75F. Pt now with temperature of 100.65F.  ? ?VS: T 100.65F, BP 157/89, HR 105, RR 28, SpO2 94% on 2LNC ? ?Assisted RN with nursing care and pt mentation continued to improve to baseline. ? ?Plan of Care:  ?Continue to monitor pt temperature. PRN Tylenol available. Ice packs if fever persists. ? ?Call rapid response for additional needs.  ? ?Event Summary:  ?MD Notified: per RN ?Call Time: 1232 ?Arrival Time: 1235 ?End Time: 1315 ? ?1233, RN ?

## 2021-08-31 NOTE — Assessment & Plan Note (Addendum)
Secondary to deconditioning, body habitus, viral URI. ?PT/OT-recommending SNF.  Ptn and family declined SNF therefore going home with HH ?

## 2021-09-01 LAB — BASIC METABOLIC PANEL
Anion gap: 12 (ref 5–15)
BUN: 28 mg/dL — ABNORMAL HIGH (ref 8–23)
CO2: 27 mmol/L (ref 22–32)
Calcium: 8.5 mg/dL — ABNORMAL LOW (ref 8.9–10.3)
Chloride: 101 mmol/L (ref 98–111)
Creatinine, Ser: 1.21 mg/dL — ABNORMAL HIGH (ref 0.44–1.00)
GFR, Estimated: 49 mL/min — ABNORMAL LOW (ref >60)
Glucose, Bld: 204 mg/dL — ABNORMAL HIGH (ref 70–99)
Potassium: 3.8 mmol/L (ref 3.5–5.1)
Sodium: 140 mmol/L (ref 135–145)

## 2021-09-01 LAB — URINE CULTURE

## 2021-09-01 LAB — GLUCOSE, CAPILLARY
Glucose-Capillary: 137 mg/dL — ABNORMAL HIGH (ref 70–99)
Glucose-Capillary: 156 mg/dL — ABNORMAL HIGH (ref 70–99)
Glucose-Capillary: 125 mg/dL — ABNORMAL HIGH (ref 70–99)
Glucose-Capillary: 149 mg/dL — ABNORMAL HIGH (ref 70–99)

## 2021-09-01 LAB — MAGNESIUM: Magnesium: 1.9 mg/dL (ref 1.7–2.4)

## 2021-09-01 MED ORDER — FLUCONAZOLE 150 MG PO TABS
150.0000 mg | ORAL_TABLET | Freq: Once | ORAL | Status: AC
  Administered 2021-09-01: 150 mg via ORAL
  Filled 2021-09-01 (×2): qty 1

## 2021-09-01 MED ORDER — FUROSEMIDE 40 MG PO TABS
40.0000 mg | ORAL_TABLET | Freq: Two times a day (BID) | ORAL | Status: DC
Start: 2021-09-01 — End: 2021-09-03
  Administered 2021-09-01 – 2021-09-02 (×2): 40 mg via ORAL
  Filled 2021-09-01 (×3): qty 1

## 2021-09-01 NOTE — Progress Notes (Signed)
Speech Language Pathology Treatment: Dysphagia  ?Patient Details ?Name: Whitney Sloan ?MRN: [id number] ?DOB: 09-17-1955 ?Today's Date: 09/01/2021 ?Time: 1720-1735 ?SLP Time Calculation (min) (ACUTE ONLY): 15 min ? ?Assessment / Plan / Recommendation ?Clinical Impression ? Patient seen by SLP for skilled treatment session focused on dysphagia goals. RN assisted SLP with repositioning patient in bed so she could eat her dinner meal tray. Patient's daughter did arrive during session. Patient was much more alert and coherent today as compared to yesterday but she still exhibits delays in responding and is not completely back to baseline. She was able to feed herself some of her dinner meal and SLP was able to observe her with PO intake of variety of consistencies. She had one delayed but explosive cough following initial bites of food but then did not have any further overt s/s aspiration or penetration. RN reported that patient did get nauseated with medications earlier today. SLP is recommending to continue on regular texture solids, thin liquids and will follow briefly to ensure diet toleration. ? ?  ?HPI HPI: Pt is a 66 y.o. F who presents 08/30/2021 for evaluation of nausea, vomiting cough, sore throat, generalized weakness. Chest x-ray shows chronic enlarged cardiac silhouette with bibasilar atelectasis. CT head negative for acute abnormality. Significant PMH: diastolic CHF, insulin dependent T2DM, HTN, HLD, morbid obesity. On 3/21, RN reported that patient "coughed/threw up all her meds", prompting this bedside/clinical swallow evaluation. ?  ?   ?SLP Plan ? Continue with current plan of care ? ?  ?  ?Recommendations for follow up therapy are one component of a multi-disciplinary discharge planning process, led by the attending physician.  Recommendations may be updated based on patient status, additional functional criteria and insurance authorization. ?  ? ?Recommendations  ?Diet recommendations: Thin  liquid;Regular ?Liquids provided via: Cup;Straw ?Medication Administration: Whole meds with liquid ?Supervision: Patient able to self feed ?Compensations: Slow rate;Small sips/bites ?Postural Changes and/or Swallow Maneuvers: Seated upright 90 degrees  ?   ?    ?   ? ? ? ? Oral Care Recommendations: Oral care BID ?Follow Up Recommendations: No SLP follow up ?Assistance recommended at discharge: None ?SLP Visit Diagnosis: Dysphagia, unspecified (R13.10) ?Plan: Continue with current plan of care ? ? ? ? ?  ?  ? ?Angela Nevin, MA, CCC-SLP ?Speech Therapy ? ?

## 2021-09-01 NOTE — Plan of Care (Signed)
  Problem: Health Behavior/Discharge Planning: Goal: Ability to manage health-related needs will improve Outcome: Progressing   Problem: Clinical Measurements: Goal: Will remain free from infection Outcome: Progressing Goal: Respiratory complications will improve Outcome: Progressing Goal: Cardiovascular complication will be avoided Outcome: Progressing   Problem: Activity: Goal: Risk for activity intolerance will decrease Outcome: Progressing   

## 2021-09-01 NOTE — TOC Initial Note (Addendum)
Transition of Care (TOC) - Initial/Assessment Note  ? ? ?Patient Details  ?Name: Whitney Sloan ?MRN: [id number] ?Date of Birth: 1956/06/13 ? ?Transition of Care (TOC) CM/SW Contact:    ?Vinie Sill, LCSW ?Phone Number: ?09/01/2021, 4:44 PM ? ?Clinical Narrative:                 ? ?CSW talked with patient via phone call. CSW introduced self and explained role. CSW discussed with patient therapy recommendation of short term rehab at [facility]. Patient states she is agreeable to SNF but preference is to go home. She reports her daughter works form home doing the day and would be there to provide assistance. Patient was not certain of her disposition plan, therefore, CSW encourage the patient to start the SNF process and discuss disposition plan with her daughters. Patient was agreeable. CSW explained the SNF process and advised will call her daughter to inform of recommendations. Patient was agreeable. ? ?CSW called daughter Jerene Pitch, received no answer. CSW spoke with daughter,Arieia and explained therapy recommendations for short term rehab and patient's preference to go home. She explained patient's daughter Jerene Pitch is home durning the day but she works from home and is unable to provide the level of care for the patient at this time. Gillian Shields, states she  works too  and neither can assist patient during the day. She reports trying to get community support services such as [facility] and [facility] services in place for patient. She believes the patient needs SNF before returning home. CSW  encouraged the family to discuss with each other what will be the plan and CSW will follow up tomorrow. ? ?Thurmond Butts, MSW, LCSW ?Clinical Social Worker ? ? ? ?Expected Discharge Plan: [place] ?Barriers to Discharge: Ship broker, Continued Medical Work up, SNF Pending bed offer ? ? ?Patient Goals and CMS Choice ?  ?  ?  ? ?Expected Discharge Plan and Services ?Expected Discharge Plan: [place] ?In-house Referral: Clinical Social Work ?  ?  ?Living arrangements for the past 2 months: Apartment ?                ?  ?  ?  ?  ?  ?  ?  ?  ?  ?  ? ?Prior Living Arrangements/Services ?Living arrangements for the past 2 months: Apartment ?Lives with:: Self, Adult Children ?Patient language and need for interpreter reviewed:: No ?       ?Need for Family Participation in Patient Care: Yes (Comment) ?Care giver support system in place?: Yes (comment) ?  ?Criminal Activity/Legal Involvement Pertinent to Current Situation/Hospitalization: No - Comment as needed ? ?Activities of Daily Living ?  ?  ? ?Permission Sought/Granted ?Permission sought to share information with : Family Supports ?Permission granted to share information with : Yes, Verbal Permission Granted ? Share Information with NAME: Alyssa Grove and Consuelo Pandy ? Permission granted to share info w AGENCY: SNF ? Permission granted to share info w Relationship: daughters ? Permission granted to share info w Contact Information: [phone] ? ?Emotional Assessment ?  ?  ?  ?Orientation: : Oriented to Self, Oriented to Place, Oriented to  Time, Oriented to Situation ?Alcohol / Substance Use: Not Applicable ?Psych Involvement: No (comment) ? ?Admission diagnosis:  Weakness [R53.1] ?Hyperglycemia [R73.9] ?Uncontrolled type 2 diabetes mellitus with hyperglycemia, with long-term current use of insulin (HCC) [E11.65, Z79.4] ?Patient Active Problem List  ? Diagnosis Date Noted  ? Acute encephalopathy 08/31/2021  ?  UTI (urinary tract infection) 08/31/2021  ? AKI (acute kidney injury) ([place]) 08/30/2021  ? Elevated troponin 08/30/2021  ? (HFpEF) heart failure with preserved ejection fraction ([place]) 08/30/2021  ? Generalized weakness 08/30/2021  ? COVID-19 virus infection 12/13/2020  ? Type 2 diabetes mellitus with microalbuminuria, with long-term current use of insulin ([place]) 10/15/2020  ? Right knee pain 04/10/2020  ? Chest pain of uncertain etiology  99991111  ? CAP (community acquired pneumonia) 12/19/2019  ? Constipation 12/10/2019  ? Cholelithiasis 12/10/2019  ? Thyroid nodule 12/10/2019  ? Obesity (BMI 30-39.9) 12/10/2019  ? Acute on chronic combined systolic and diastolic CHF (congestive heart failure) ([place]) 11/08/2019  ? Acute respiratory failure with hypoxia ([place]) 11/08/2019  ? Hypertensive urgency 11/08/2019  ? Mixed diabetic hyperlipidemia associated with type 2 diabetes mellitus ([place]) 11/08/2019  ? Type 2 diabetes mellitus with diabetic polyneuropathy, with long-term current use of insulin ([place]) 08/08/2019  ? Dyslipidemia 03/23/2010  ? DECREASED HEARING, BILATERAL 02/05/2010  ? MEMORY LOSS 02/05/2010  ? INCONTINENCE 02/05/2010  ? NEOPLASM, MALIGNANT, BREAST, HX OF 01/13/2010  ? ANXIETY STATE, UNSPECIFIED 12/02/2009  ? UNSTEADY GAIT 12/02/2009  ? TOE PAIN 09/24/2009  ? ANAL OR RECTAL PAIN 08/25/2009  ? LEG EDEMA, BILATERAL 08/25/2009  ? Uncontrolled type 2 diabetes mellitus with hyperglycemia, with long-term current use of insulin ([place]) 08/06/2009  ? Hypertension associated with diabetes ([place]) 08/06/2009  ? DERMATITIS, ATOPIC 08/06/2009  ? ?PCP:  Camillia Herter, NP ?Pharmacy:   ?[id number] - [place], [place] ?[address] ?[place] [address] ?Phone: [phone] Fax: [phone] ? ?[facility] ?[address] ?[place] [address] ?Phone: [phone] Fax: [phone] ? ? ? ? ?Social Determinants of Health (SDOH) Interventions ?  ? ?Readmission Risk Interventions ?   ? View : No data to display.  ?  ?  ?  ? ? ? ?

## 2021-09-01 NOTE — Progress Notes (Signed)
?PROGRESS NOTE ? ? ? ?Whitney Sloan  [id number] DOB: [birth year] DOA: 08/30/2021 ?PCP: Camillia Herter, NP  ? ?Brief Narrative:  ?66 year old with history of diastolic CHF EF [id number], grade 2 DD, insulin-dependent DM 2, HTN, HLD, morbid obesity comes to the hospital with complains of nausea, vomiting, cough and sore throat.  SHe has generally been feeling weak over the past 3 days prior to admission.  Typically uses walker/cane to ambulate but unable to do so.  Upon admission CT head was negative, she was hyperglycemic, COVID/flu negative.  She was found to be in acute kidney injury with uncontrolled DM 2. ? ? ?Assessment & Plan: ? Principal Problem: ?  Uncontrolled type 2 diabetes mellitus with hyperglycemia, with long-term current use of insulin (HCC) ?Active Problems: ?  UTI (urinary tract infection) ?  AKI (acute kidney injury) ([place]) ?  Elevated troponin ?  Generalized weakness ?  (HFpEF) heart failure with preserved ejection fraction ([place]) ?  Hypertension associated with diabetes ([place]) ?  Mixed diabetic hyperlipidemia associated with type 2 diabetes mellitus ([place]) ?  Acute encephalopathy ?  ? ? ?Assessment and Plan: ?* Uncontrolled type 2 diabetes mellitus with hyperglycemia, with long-term current use of insulin ([place]) ?Hyperglycemic without evidence of DKA/HHS in setting of nonadherence to insulin and dietary indiscretion.  Hemoglobin A1c >15 on 3/17. ?-Metformin on hold.  Continue long-acting, Premeal insulin.  Sliding scale Accu-Cheks ? ?UTI (urinary tract infection) ?Follow up Cx. Empiric IV Roc.  ?Concerns of yeast infection.  Fluconazole p.o. once ordered.  Nystatin powder. ? ?AKI (acute kidney injury) ([place]) ?Admission creatinine 1.4, gradually improving.  Holding off on home metformin and losartan.  Continue to closely monitor this. ?Baseline creatinine 0.8 ? ? ? ?Generalized weakness ?Secondary to deconditioning, body habitus, viral URI. ?PT/OT-recommending SNF.  TOC consult ? ?Elevated  troponin ?hronically elevated, currently chest pain-free.  Repeat echo shows EF 50%, grade 2 DD ?Had abnormal stress back in July 2021 but she had declined left heart catheterization. ? ? ? ? ?(HFpEF) heart failure with preserved ejection fraction ([place]) ?Due to obesity difficult to assess volume status.  Did receive IV bolus in the ED.  Continue to closely monitor this.  Nausea improved therefore will transition to p.o. Lasix).  Strictly monitor input and output.  Echocardiogram shows EF of [id number], grade 2 diastolic dysfunction. ? ? ? ?Hypertension associated with diabetes ([place]) ?Resume home Coreg 25 mg twice daily, diltiazem to 40 mg twice daily, Lasix BID.  Hold losartan in setting of AKI. ? ?Mixed diabetic hyperlipidemia associated with type 2 diabetes mellitus ([place]) ?Continue atorvastatin and Zetia. ? ?Acute encephalopathy ?Likely from UTI but this has improved.  No focal neurodeficits.  Ammonia was slightly elevated therefore received lactulose.  TSH is normal. ? ? ? ?DVT prophylaxis: Lovenox ?Code Status: Full Code ?Family Communication:  Jerene Pitch called but no answer.  ? ?Maintain hospital stay due to persistent acute kidney injury. TOC consulted SNF ? ? ?  ? ?  ? ?Body mass index is 40.82 kg/m?. ? ?Pressure Injury 08/31/21 Buttocks Moisture associated damage- redness (Active)  ?08/31/21 1355  ?Location: Buttocks  ?Location Orientation:   ?Staging:   ?Wound Description (Comments): Moisture associated damage- redness  ?Present on Admission: Yes  ?Dressing Type Foam - Lift dressing to assess site every shift 08/31/21 2300  ? ? ? ? ? ? ? ? ?Subjective: ?Feels better today, no new complaints.  ? ? ? ?Examination: ? ?General exam: Appears calm and comfortable  ?Respiratory system: diminished  b/l diffusely.  ?Cardiovascular system: S1 & S2 heard, RRR. No JVD, murmurs, rubs, gallops or clicks. 2+ b/l LE edema ?Gastrointestinal system: Abdomen is nondistended, soft and nontender. No organomegaly or masses felt. Normal  bowel sounds heard. ?Central nervous system: Alert and oriented. No focal neurological deficits. ?Extremities: Symmetric 5 x 5 power. ?Skin: No rashes, lesions or ulcers ?Psychiatry: Judgement and insight appear normal. Mood & affect appropriate.  ? ? ? ?Objective: ?Vitals:  ? 09/01/21 0018 09/01/21 0525 09/01/21 0748 09/01/21 1057  ?BP: (!) 148/80 (!) 149/75 (!) 160/87   ?Pulse: 91 94 97 100  ?Resp: 16 20 18 18   ?Temp: 97.6 ?F (36.4 ?C) 97.6 ?F (36.4 ?C) 99.6 ?F (37.6 ?C) 99.7 ?F (37.6 ?C)  ?TempSrc: Oral Oral Oral   ?SpO2: 97% 94% 90% 91%  ?Weight:  106.2 kg    ?Height:      ? ? ?Intake/Output Summary (Last 24 hours) at 09/01/2021 1152 ?Last data filed at 09/01/2021 1056 ?Gross per 24 hour  ?Intake 340 ml  ?Output 2510 ml  ?Net -2170 ml  ? ?Filed Weights  ? 08/30/21 1858 09/01/21 0525  ?Weight: 108.9 kg 106.2 kg  ? ? ? ?Data Reviewed:  ? ?CBC: ?Recent Labs  ?Lab 08/30/21 ?1920 08/30/21 ?1944 08/31/21 ?0144  ?WBC 7.2  --  8.3  ?NEUTROABS 4.3  --   --   ?HGB 12.5 14.3 12.9  ?HCT 40.3 42.0 39.9  ?MCV 87.2  --  85.4  ?PLT 189  --  142*  ? ?Basic Metabolic Panel: ?Recent Labs  ?Lab 08/27/21 ?1404 08/30/21 ?1920 08/30/21 ?1944 08/31/21 ?0144 09/01/21 ?0120  ?NA 139 131* 132* 134* 140  ?K 3.8 4.2 4.0 3.7 3.8  ?CL 98 93* 94* 97* 101  ?CO2 34* 25  --  24 27  ?GLUCOSE 429* 461* 468* 302* 204*  ?BUN 9 30* 35* 28* 28*  ?CREATININE 0.83 1.39* 1.40* 1.22* 1.21*  ?CALCIUM 9.1 8.2*  --  7.9* 8.5*  ?MG  --   --   --  1.7 1.9  ? ?GFR: ?Estimated Creatinine Clearance: 53.9 mL/min (A) (by C-G formula based on SCr of 1.21 mg/dL (H)). ?Liver Function Tests: ?Recent Labs  ?Lab 08/30/21 ?1920  ?AST 26  ?ALT 14  ?ALKPHOS 52  ?BILITOT 0.7  ?PROT 6.7  ?ALBUMIN 2.4*  ? ?No results for input(s): LIPASE, AMYLASE in the last 168 hours. ?Recent Labs  ?Lab 08/31/21 ?1143  ?AMMONIA 41*  ? ?Coagulation Profile: ?Recent Labs  ?Lab 08/30/21 ?1920  ?INR 1.1  ? ?Cardiac Enzymes: ?Recent Labs  ?Lab 08/30/21 ?1920  ?CKTOTAL 136  ? ?BNP (last 3 results) ?No  results for input(s): PROBNP in the last 8760 hours. ?HbA1C: ?No results for input(s): HGBA1C in the last 72 hours. ?CBG: ?Recent Labs  ?Lab 08/31/21 ?0630 08/31/21 ?1103 08/31/21 ?1723 08/31/21 ?2116 09/01/21 ?[id number]  ?GLUCAP 333* 294* 252* 255* 137*  ? ?Lipid Profile: ?No results for input(s): CHOL, HDL, LDLCALC, TRIG, CHOLHDL, LDLDIRECT in the last 72 hours. ?Thyroid Function Tests: ?Recent Labs  ?  08/31/21 ?1143  ?TSH 1.306  ? ?Anemia Panel: ?No results for input(s): VITAMINB12, FOLATE, FERRITIN, TIBC, IRON, RETICCTPCT in the last 72 hours. ?Sepsis Labs: ?Recent Labs  ?Lab 08/30/21 ?1920 08/30/21 ?2251 08/31/21 ?1143 08/31/21 ?1441  ?PROCALCITON  --   --  0.15  --   ?LATICACIDVEN 2.3* 3.0* 1.3 1.6  ? ? ?Recent Results (from the past 240 hour(s))  ?Culture, blood (routine x 2)     Status: None (  Preliminary result)  ? Collection Time: 08/30/21  7:20 PM  ? Specimen: BLOOD  ?Result Value Ref Range Status  ? Specimen Description BLOOD LEFT ANTECUBITAL  Final  ? Special Requests   Final  ?  BOTTLES DRAWN AEROBIC AND ANAEROBIC Blood Culture adequate volume  ? Culture   Final  ?  NO GROWTH 2 DAYS ?Performed at [facility], [place] [address] [place], [place] [address] ?  ? Report Status PENDING  Incomplete  ?Culture, blood (routine x 2)     Status: None (Preliminary result)  ? Collection Time: 08/30/21  7:35 PM  ? Specimen: BLOOD  ?Result Value Ref Range Status  ? Specimen Description BLOOD RIGHT ANTECUBITAL  Final  ? Special Requests   Final  ?  BOTTLES DRAWN AEROBIC AND ANAEROBIC Blood Culture adequate volume  ? Culture   Final  ?  NO GROWTH 2 DAYS ?Performed at [facility], [place] [address] [place], [place] [address] ?  ? Report Status PENDING  Incomplete  ?Resp Panel by RT-PCR (Flu A&B, Covid) Nasopharyngeal Swab     Status: None  ? Collection Time: 08/30/21  8:58 PM  ? Specimen: Nasopharyngeal Swab; Nasopharyngeal(NP) swabs in vial transport medium  ?Result Value Ref Range Status  ? SARS Coronavirus 2 by  RT PCR NEGATIVE NEGATIVE Final  ?  Comment: (NOTE) ?SARS-CoV-2 target nucleic acids are NOT DETECTED. ? ?The SARS-CoV-2 RNA is generally detectable in upper respiratory ?specimens during the acute phase of infection. The

## 2021-09-02 DIAGNOSIS — L8990 Pressure ulcer of unspecified site, unspecified stage: Secondary | ICD-10-CM | POA: Insufficient documentation

## 2021-09-02 LAB — AMMONIA: Ammonia: 29 umol/L (ref 9–35)

## 2021-09-02 LAB — BASIC METABOLIC PANEL
Anion gap: 6 (ref 5–15)
BUN: 21 mg/dL (ref 8–23)
CO2: 27 mmol/L (ref 22–32)
Calcium: 8.1 mg/dL — ABNORMAL LOW (ref 8.9–10.3)
Chloride: 105 mmol/L (ref 98–111)
Creatinine, Ser: 1.04 mg/dL — ABNORMAL HIGH (ref 0.44–1.00)
GFR, Estimated: 59 mL/min — ABNORMAL LOW (ref >60)
Glucose, Bld: 125 mg/dL — ABNORMAL HIGH (ref 70–99)
Potassium: 3.1 mmol/L — ABNORMAL LOW (ref 3.5–5.1)
Sodium: 138 mmol/L (ref 135–145)

## 2021-09-02 LAB — GLUCOSE, CAPILLARY
Glucose-Capillary: 122 mg/dL — ABNORMAL HIGH (ref 70–99)
Glucose-Capillary: 159 mg/dL — ABNORMAL HIGH (ref 70–99)
Glucose-Capillary: 119 mg/dL — ABNORMAL HIGH (ref 70–99)

## 2021-09-02 LAB — BRAIN NATRIURETIC PEPTIDE: B Natriuretic Peptide: 163.4 pg/mL — ABNORMAL HIGH (ref 0.0–100.0)

## 2021-09-02 LAB — MAGNESIUM: Magnesium: 1.8 mg/dL (ref 1.7–2.4)

## 2021-09-02 MED ORDER — POTASSIUM CHLORIDE 20 MEQ PO PACK
40.0000 meq | PACK | ORAL | Status: AC
  Administered 2021-09-02 (×2): 40 meq via ORAL
  Filled 2021-09-02 (×2): qty 2

## 2021-09-02 MED ORDER — NYSTATIN 100000 UNIT/GM EX POWD: 1.0000 "application " | Freq: Two times a day (BID) | CUTANEOUS | 0 refills | Status: AC

## 2021-09-02 MED ORDER — CEPHALEXIN 500 MG PO CAPS: 500.0000 mg | ORAL_CAPSULE | Freq: Four times a day (QID) | ORAL | 0 refills | Status: AC

## 2021-09-02 NOTE — NC FL2 (Signed)
?[place] MEDICAID FL2 LEVEL OF CARE SCREENING TOOL  ?  ? ?IDENTIFICATION  ?Patient Name: ?Whitney Sloan Birthdate: 1956/01/16 Sex: female Admission Date (Current Location): ?08/30/2021  ?[place] and [place] Number: ? [place] ?  Facility and Address:  ?[facility], [place] [address], [place], [place] [address] ?     Provider Number: ?[id number]  ?Attending Physician Name and Address:  ?Damita Lack, MD ? Relative Name and Phone Number:  ?Zaiden Siman, [phone] ?   ?Current Level of Care: ?Hospital Recommended Level of Care: ?[place] Prior Approval Number: ?  ? ?Date Approved/Denied: ?  PASRR Number: ?[id number] ? ?Discharge Plan: ?SNF ?  ? ?Current Diagnoses: ?Patient Active Problem List  ? Diagnosis Date Noted  ? Pressure injury of skin 09/02/2021  ? Acute encephalopathy 08/31/2021  ? UTI (urinary tract infection) 08/31/2021  ? AKI (acute kidney injury) ([place]) 08/30/2021  ? Elevated troponin 08/30/2021  ? (HFpEF) heart failure with preserved ejection fraction ([place]) 08/30/2021  ? Generalized weakness 08/30/2021  ? COVID-19 virus infection 12/13/2020  ? Type 2 diabetes mellitus with microalbuminuria, with long-term current use of insulin ([place]) 10/15/2020  ? Right knee pain 04/10/2020  ? Chest pain of uncertain etiology 99991111  ? CAP (community acquired pneumonia) 12/19/2019  ? Constipation 12/10/2019  ? Cholelithiasis 12/10/2019  ? Thyroid nodule 12/10/2019  ? Obesity (BMI 30-39.9) 12/10/2019  ? Acute on chronic combined systolic and diastolic CHF (congestive heart failure) ([place]) 11/08/2019  ? Acute respiratory failure with hypoxia ([place]) 11/08/2019  ? Hypertensive urgency 11/08/2019  ? Mixed diabetic hyperlipidemia associated with type 2 diabetes mellitus () 11/08/2019  ? Type 2 diabetes mellitus with diabetic polyneuropathy, with long-term current use of insulin ([place]) 08/08/2019  ? Dyslipidemia 03/23/2010  ? DECREASED HEARING, BILATERAL 02/05/2010  ? MEMORY  LOSS 02/05/2010  ? INCONTINENCE 02/05/2010  ? NEOPLASM, MALIGNANT, BREAST, HX OF 01/13/2010  ? ANXIETY STATE, UNSPECIFIED 12/02/2009  ? UNSTEADY GAIT 12/02/2009  ? TOE PAIN 09/24/2009  ? ANAL OR RECTAL PAIN 08/25/2009  ? LEG EDEMA, BILATERAL 08/25/2009  ? Uncontrolled type 2 diabetes mellitus with hyperglycemia, with long-term current use of insulin ([place]) 08/06/2009  ? Hypertension associated with diabetes ([place]) 08/06/2009  ? DERMATITIS, ATOPIC 08/06/2009  ? ? ?Orientation RESPIRATION BLADDER Height & Weight   ?  ?Self, Time, Situation, Place ? O2 ([place] 2) Incontinent, External catheter Weight: 235 lb 0.2 oz (106.6 kg) ?Height:  5' 3.5" (161.3 cm)  ?BEHAVIORAL SYMPTOMS/MOOD NEUROLOGICAL BOWEL NUTRITION STATUS  ?    Incontinent Diet (See DC summary)  ?AMBULATORY STATUS COMMUNICATION OF NEEDS Skin   ?Extensive Assist Verbally PU Stage and Appropriate Care (PU St2 Bilateral Buttock w/ MASD and foam dressing) ?  ?  ?  ?    ?     ?     ? ? ?Personal Care Assistance Level of Assistance  ?Bathing, Dressing, Feeding Bathing Assistance: Maximum assistance ?Feeding assistance: Limited assistance ?Dressing Assistance: Maximum assistance ?   ? ?Functional Limitations Info  ?Sight, Hearing, Speech Sight Info: Impaired ?Hearing Info: Impaired ?Speech Info: Adequate  ? ? ?SPECIAL CARE FACTORS FREQUENCY  ?PT (By licensed PT), OT (By licensed OT)   ?  ?PT Frequency: 5x week ?OT Frequency: 5x week ?  ?  ?  ?   ? ? ?Contractures Contractures Info: Not present  ? ? ?Additional Factors Info  ?Code Status, Allergies, Insulin Sliding Scale Code Status Info: Full ?Allergies Info: Oxycodone- Acetaminophen ?  ?Insulin Sliding Scale  Info: Insulin Aspart (Novolog) 0-5 U @ bed, 0-9 U 3x daily w/ meals, 20 U 3x daily w/ meals; Insulin Glargine YFGN (Semglee) 60 U daily ?  ?   ? ?Current Medications (09/02/2021):  This is the current hospital active medication list ?Current Facility-Administered Medications  ?Medication Dose Route Frequency Provider  Last Rate Last Admin  ? acetaminophen (TYLENOL) tablet 650 mg  650 mg Oral Q6H PRN Lenore Cordia, MD   650 mg at 09/01/21 1309  ? Or  ? acetaminophen (TYLENOL) suppository 650 mg  650 mg Rectal Q6H PRN Lenore Cordia, MD   650 mg at 08/31/21 1309  ? alum & mag hydroxide-simeth (MAALOX/MYLANTA) 200-200-20 MG/5ML suspension 30 mL  30 mL Oral Q4H PRN Amin, Ankit Chirag, MD      ? atorvastatin (LIPITOR) tablet 20 mg  20 mg Oral QPM Zada Finders R, MD   20 mg at 09/01/21 1655  ? carvedilol (COREG) tablet 25 mg  25 mg Oral BID WC Lenore Cordia, MD   25 mg at 09/02/21 0845  ? cefTRIAXone (ROCEPHIN) 1 g in sodium chloride 0.9 % 100 mL IVPB  1 g Intravenous Q24H Amin, Ankit Chirag, MD 200 mL/hr at 09/01/21 1312 1 g at 09/01/21 1312  ? diltiazem (CARDIZEM CD) 24 hr capsule 240 mg  240 mg Oral Daily Lenore Cordia, MD   240 mg at 09/02/21 0844  ? enoxaparin (LOVENOX) injection 40 mg  40 mg Subcutaneous Q24H Lenore Cordia, MD   40 mg at 09/02/21 0845  ? ezetimibe (ZETIA) tablet 10 mg  10 mg Oral QHS Zada Finders R, MD   10 mg at 09/01/21 2119  ? furosemide (LASIX) tablet 40 mg  40 mg Oral BID Damita Lack, MD   40 mg at 09/02/21 0844  ? guaiFENesin (MUCINEX) 12 hr tablet 600 mg  600 mg Oral BID PRN Zada Finders R, MD   600 mg at 08/31/21 0159  ? hydrALAZINE (APRESOLINE) injection 10 mg  10 mg Intravenous Q4H PRN Amin, Ankit Chirag, MD      ? hydrocortisone (ANUSOL-HC) 2.5 % rectal cream 1 application.  1 application. Topical QID PRN Amin, Ankit Chirag, MD      ? hydrocortisone cream 1 % 1 application.  1 application. Topical TID PRN Amin, Ankit Chirag, MD      ? insulin aspart (novoLOG) injection 0-5 Units  0-5 Units Subcutaneous QHS Lenore Cordia, MD   3 Units at 08/31/21 2214  ? insulin aspart (novoLOG) injection 0-9 Units  0-9 Units Subcutaneous TID WC Lenore Cordia, MD   1 Units at 09/02/21 0636  ? insulin aspart (novoLOG) injection 20 Units  20 Units Subcutaneous TID WC Zada Finders R, MD      ?  insulin glargine-yfgn (SEMGLEE) injection 60 Units  60 Units Subcutaneous Daily Lenore Cordia, MD   60 Units at 09/02/21 0845  ? ipratropium-albuterol (DUONEB) 0.5-2.5 (3) MG/3ML nebulizer solution 3 mL  3 mL Nebulization Q4H PRN Amin, Ankit Chirag, MD      ? lip balm (CARMEX) ointment 1 application.  1 application. Topical PRN Amin, Jeanella Flattery, MD      ? loratadine (CLARITIN) tablet 10 mg  10 mg Oral Daily PRN Amin, Ankit Chirag, MD      ? menthol-cetylpyridinium (CEPACOL) lozenge 3 mg  1 lozenge Oral PRN Zada Finders R, MD      ? metoprolol tartrate (LOPRESSOR) injection 5 mg  5 mg Intravenous Q4H  PRN Damita Lack, MD      ? Muscle Rub CREA 1 application.  1 application. Topical PRN Amin, Jeanella Flattery, MD      ? nystatin (MYCOSTATIN/NYSTOP) topical powder   Topical TID Damita Lack, MD   Given at 09/02/21 [phone]  ? ondansetron (ZOFRAN) tablet 4 mg  4 mg Oral Q6H PRN Lenore Cordia, MD      ? Or  ? ondansetron (ZOFRAN) injection 4 mg  4 mg Intravenous Q6H PRN Lenore Cordia, MD   4 mg at 08/31/21 1144  ? phenol (CHLORASEPTIC) mouth spray 1 spray  1 spray Mouth/Throat PRN Amin, Ankit Chirag, MD      ? polyvinyl alcohol (LIQUIFILM TEARS) 1.4 % ophthalmic solution 1 drop  1 drop Both Eyes PRN Amin, Ankit Chirag, MD      ? potassium chloride (KLOR-CON) packet 40 mEq  40 mEq Oral Q4H Amin, Ankit Chirag, MD   40 mEq at 09/02/21 0843  ? senna-docusate (Senokot-S) tablet 1 tablet  1 tablet Oral QHS PRN Lenore Cordia, MD      ? sodium chloride (OCEAN) 0.65 % nasal spray 1 spray  1 spray Each Nare PRN Amin, Ankit Chirag, MD      ? sodium chloride flush (NS) 0.9 % injection 3 mL  3 mL Intravenous Q12H Lenore Cordia, MD   3 mL at 09/02/21 0859  ? traZODone (DESYREL) tablet 50 mg  50 mg Oral QHS PRN Amin, Jeanella Flattery, MD      ? ? ? ?Discharge Medications: ?Please see discharge summary for a list of discharge medications. ? ?Relevant Imaging Results: ? ?Relevant Lab Results: ? ? ?Additional Information ?SS#  [place] ? ?Coralee Pesa, LCSWA ? ? ? ? ?

## 2021-09-02 NOTE — TOC Progression Note (Signed)
Transition of Care (TOC) - Progression Note  ? ? ?Patient Details  ?Name: Whitney Sloan ?MRN: [id number] ?Date of Birth: November 22, 1955 ? ?Transition of Care (TOC) CM/SW Contact  ?Eduard Roux, LCSW ?Phone Number: ?09/02/2021, 1:03 PM ? ?Clinical Narrative:    ? ?Update: received call from [facility]- informed patient was leaning towards SNF. If family wants to move forward with SNF, they need to call CSW back preferably today. ? ? ?CSW gave patient bed offers- she states she doesn't want to go to rehab but believes it may be the best choice. She states she is waiting to have final decision with her daughters. CSW called each daughter but received no answer. CSW advised, if going to SNF she will need to provide SNF choice today. She states understanding. ? ?Antony Blackbird, MSW, LCSW ?Clinical Social Worker ? ? ? ? ?Expected Discharge Plan: Skilled Nursing Facility ?Barriers to Discharge: English as a second language teacher, Continued Medical Work up, SNF Pending bed offer ? ?Expected Discharge Plan and Services ?Expected Discharge Plan: Skilled Nursing Facility ?In-house Referral: Clinical Social Work ?  ?  ?Living arrangements for the past 2 months: Apartment ?Expected Discharge Date: 09/02/21               ?  ?  ?  ?  ?  ?  ?  ?  ?  ?  ? ? ?Social Determinants of Health (SDOH) Interventions ?  ? ?Readmission Risk Interventions ?   ? View : No data to display.  ?  ?  ?  ? ? ?

## 2021-09-02 NOTE — Discharge Summary (Signed)
Physician Discharge Summary  ?Whitney Sloan [id number] DOB: [birth year] DOA: 08/30/2021 ? ?PCP: Camillia Herter, NP ? ?Admit date: 08/30/2021 ?Discharge date: 09/02/2021 ? ?Admitted From: Home ?Disposition:  Declined SNF therefore home with HH ? ?Recommendations for Outpatient Follow-up:  ?Follow up with PCP in 1-2 weeks ?Please obtain BMP/CBC in one week your next doctors visit.  ?Keflex and Nystatin powder presrcibed ?Recommend Endocrine referral by outptn PCP ? ? ?Discharge Condition: Stable ?CODE STATUS: Full ?Diet recommendation: Diabetic  ? ?Brief/Interim Summary: ?66 year old with history of diastolic CHF EF [id number], grade 2 DD, insulin-dependent DM 2, HTN, HLD, morbid obesity comes to the hospital with complains of nausea, vomiting, cough and sore throat.  SHe has generally been feeling weak over the past 3 days prior to admission.  Typically uses walker/cane to ambulate but unable to do so.  Upon admission CT head was negative, she was hyperglycemic, COVID/flu negative.  She was found to be in acute kidney injury with uncontrolled DM 2.  During the hospital course patient received IV antibiotics for urinary tract infection, nephrotoxic drugs were held and received oral diuretics.  Renal function continued to improve slowly.  PT recommended SNF therefore arrangements being made with the help of TOC team but eventually ptn and family declined snf. Now going home with HH.  ?  ? ? ?Assessment and Plan: ?* Uncontrolled type 2 diabetes mellitus with hyperglycemia, with long-term current use of insulin ([place]) ?Hyperglycemic without evidence of DKA/HHS in setting of nonadherence to insulin and dietary indiscretion.  Hemoglobin A1c >15 on 3/17. ?Resume home meds, BS well controlled here in the hospital over last 48 hrs. Would encourage endocrine referral by PCP ? ?UTI (urinary tract infection) ?Ucx- multiple species. Empiric IV Roc > transition to PO keflex.  ?Concerns of yeast infection.  Fluconazole p.o.once given 3/22.  Nystatin powder. ? ?AKI (acute kidney injury) ([place]) ?Admission creatinine 1.4, gradually improving.  On metformin and losartan on hold.  Creatinine today 1.04 ?Baseline creatinine 0.8 ?Repeat outptn lab work with pcp in one week ? ? ? ?Generalized weakness ?Secondary to deconditioning, body habitus, viral URI. ?PT/OT-recommending SNF.  Ptn and family declined SNF therefore going home with [place] ? ?Elevated troponin ?hronically elevated, currently chest pain-free.  Repeat echo shows EF 50%, grade 2 DD ?Had abnormal stress back in July 2021 but she had declined left heart catheterization. ? ? ? ? ?(HFpEF) heart failure with preserved ejection fraction ([place]) ?Due to obesity difficult to assess volume status.  Did receive IV bolus in the ED. p.o. Lasix.   Echocardiogram shows EF of A999333, grade 2 diastolic dysfunction. ? ? ? ?Hypertension associated with diabetes ([place]) ?Resume all home meds ? ?Mixed diabetic hyperlipidemia associated with type 2 diabetes mellitus ([place]) ?Continue atorvastatin and Zetia. ? ?Acute encephalopathy ?Resolved.  Likely from UTI but this has improved.  No focal neurodeficits.  TSH normal.  Ammonia was slightly elevated therefore received lactulose. Now normal ? ? ? ? ?  ?Body mass index is 40.98 kg/m?. ? ?Pressure Injury 08/31/21 Buttocks Right;Left;Mid Stage 2 -  Partial thickness loss of dermis presenting as a shallow open injury with a red, pink wound bed without slough. Moisture associated damage- redness multi small opean redden areas (Active)  ?08/31/21 1355  ?Location: Buttocks  ?Location Orientation: Right;Left;Mid  ?Staging: Stage 2 -  Partial thickness loss of dermis presenting as a shallow open injury with a red, pink wound bed without slough.  ?Wound Description (Comments): Moisture associated damage- redness multi small opean  redden areas  ?Present on Admission: Yes  ? ? ? ? ?Discharge Diagnoses:  ?Principal Problem: ?  Uncontrolled type 2 diabetes mellitus with hyperglycemia, with  long-term current use of insulin ([place]) ?Active Problems: ?  UTI (urinary tract infection) ?  AKI (acute kidney injury) ([place]) ?  Elevated troponin ?  Generalized weakness ?  (HFpEF) heart failure with preserved ejection fraction ([place]) ?  Hypertension associated with diabetes ([place]) ?  Mixed diabetic hyperlipidemia associated with type 2 diabetes mellitus ([place]) ?  Acute encephalopathy ?  Pressure injury of skin ? ? ? ? ? ? ?Subjective: ?Doing ok, wants to go home. Declined SNF.  ? ?Discharge Exam: ?Vitals:  ? 09/02/21 0838 09/02/21 1144  ?BP: (!) 151/81 138/72  ?Pulse: 66 63  ?Resp: 20 20  ?Temp: 98.6 ?F (37 ?C) 98.8 ?F (37.1 ?C)  ?SpO2: 97% 97%  ? ?Vitals:  ? 09/01/21 2358 09/02/21 0435 09/02/21 [id number] 09/02/21 1144  ?BP: 133/77 (!) 157/89 (!) 151/81 138/72  ?Pulse: 65 66 66 63  ?Resp: 20 20 20 20   ?Temp: 98.3 ?F (36.8 ?C) 98.7 ?F (37.1 ?C) 98.6 ?F (37 ?C) 98.8 ?F (37.1 ?C)  ?TempSrc: Oral Oral Oral Oral  ?SpO2: 97% 98% 97% 97%  ?Weight:  106.6 kg    ?Height:      ? ? ?General: Pt is alert, awake, not in acute distress ?Cardiovascular: RRR, S1/S2 +, no rubs, no gallops ?Respiratory: CTA bilaterally, no wheezing, no rhonchi ?Abdominal: Soft, NT, ND, bowel sounds + ?Extremities: no edema, no cyanosis ? ?Discharge Instructions ? ? ?Allergies as of 09/02/2021   ? ?   Reactions  ? Oxycodone-acetaminophen Nausea And Vomiting  ? ?  ? ?  ?Medication List  ?  ? ?TAKE these medications   ? ?acetaminophen 500 MG tablet ?Commonly known as: TYLENOL ?Take 500 mg by mouth every 6 (six) hours as needed for moderate pain or headache. ?  ?ALKA-SELTZER PLUS COLD PO ?Take 2 tablets by mouth every 6 (six) hours as needed (cold symptoms). ?  ?Aspir-Low 81 MG EC tablet ?Generic drug: aspirin ?Take 81 mg by mouth every morning. ?  ?atorvastatin 20 MG tablet ?Commonly known as: LIPITOR ?Take 1 tablet (20 mg total) by mouth every evening. ?  ?carvedilol 25 MG tablet ?Commonly known as: Coreg ?Take 1 tablet (25 mg total) by mouth 2 (two) times  daily. ?  ?cephALEXin 500 MG capsule ?Commonly known as: KEFLEX ?Take 1 capsule (500 mg total) by mouth 4 (four) times daily for 5 days. ?  ?clotrimazole 1 % vaginal cream ?Commonly known as: GYNE-LOTRIMIN ?Place 1 Applicatorful vaginally daily as needed (yeast). ?  ?Diclofenac Sodium 3 % Gel ?Apply 2 g topically 2 (two) times daily as needed. ?What changed: reasons to take this ?  ?Dilt-XR 240 MG 24 hr capsule ?Generic drug: diltiazem ?Take 240 mg by mouth daily. ?  ?ELDERBERRY PO ?Take 1 tablet by mouth daily as needed (immune support). Gummy ?  ?Ex-Lax 15 MG Tabs ?Generic drug: Sennosides ?Take 15 mg by mouth daily as needed (constipation). ?  ?ezetimibe 10 MG tablet ?Commonly known as: ZETIA ?Take 10 mg by mouth at bedtime. ?  ?furosemide 80 MG tablet ?Commonly known as: LASIX ?Take 1 tablet (80 mg total) by mouth 2 (two) times daily. ?  ?Insulin Pen Needle 32G X 4 MM Misc ?1 Device by Does not apply route as directed. ?  ?Lantus SoloStar 100 UNIT/ML Solostar Pen ?Generic drug: insulin glargine ?Inject 60 Units into the  skin daily. ?  ?losartan 100 MG tablet ?Commonly known as: COZAAR ?Take 1 tablet (100 mg total) by mouth daily. ?  ?magnesium oxide 400 MG tablet ?Commonly known as: MAG-OX ?Take 1 tablet (400 mg total) by mouth daily. ?  ?metFORMIN 500 MG tablet ?Commonly known as: GLUCOPHAGE ?Take 1 tablet (500 mg total) by mouth 2 (two) times daily with a meal. ?  ?NovoLOG FlexPen 100 UNIT/ML FlexPen ?Generic drug: insulin aspart ?Inject 20 Units into the skin 3 (three) times daily with meals. ?What changed: when to take this ?  ?nystatin powder ?Commonly known as: MYCOSTATIN/NYSTOP ?Apply 1 application. topically 2 (two) times daily for 7 days. ?What changed:  ?when to take this ?reasons to take this ?  ?polyethylene glycol 17 g packet ?Commonly known as: MIRALAX / GLYCOLAX ?Take 17 g by mouth daily as needed for mild constipation. ?  ?potassium chloride SA 20 MEQ tablet ?Commonly known as: KLOR-CON M ?Take 1  tablet (20 mEq total) by mouth daily. ?  ? ?  ? ? Follow-up Information   ? ? Camillia Herter, NP. Schedule an appointment as soon as possible for a visit in 1 week(s).   ?Specialty: Nurse Practitioner ?Con

## 2021-09-02 NOTE — TOC Progression Note (Signed)
Transition of Care (TOC) - Progression Note  ? ? ?Patient Details  ?Name: Whitney Sloan ?MRN: [id number] ?Date of Birth: 09/06/1955 ? ?Transition of Care (TOC) CM/SW Contact  ?Eduard Roux, LCSW ?Phone Number: ?09/02/2021, 1:41 PM ? ?Clinical Narrative:    ? ?Received call form patient's daughter, Adele Barthel- she advised family has decided for the patient to return home and declined SNF. She reports she will be there during the day to assist with the patient's care and Debbe Bales will be there in the evenings. She advised the patient will begin PACE program beginning 10/11/2021.  ? ?CSW updated RN and RNCM. ?TOC will continue to follow and assist with discharge planning. ? ?Antony Blackbird, MSW, LCSW ?Clinical Social Worker ? ? ? ? ?Expected Discharge Plan: Skilled Nursing Facility ?Barriers to Discharge: English as a second language teacher, Continued Medical Work up, SNF Pending bed offer ? ?Expected Discharge Plan and Services ?Expected Discharge Plan: Skilled Nursing Facility ?In-house Referral: Clinical Social Work ?  ?  ?Living arrangements for the past 2 months: Apartment ?Expected Discharge Date: 09/02/21               ?  ?  ?  ?  ?  ?  ?  ?  ?  ?  ? ? ?Social Determinants of Health (SDOH) Interventions ?  ? ?Readmission Risk Interventions ?   ? View : No data to display.  ?  ?  ?  ? ? ?

## 2021-09-02 NOTE — Progress Notes (Signed)
Patient alert and orin. With no complaints. All lines and tubes and tele. D.C. by day shift. Patient has voided since foley d.c. D.C. instr. Gone over with patient and daughter Nehemiah Settle. Patient D.C. via W.C with belongings and equipment to home with daughter Nehemiah Settle. ?

## 2021-09-02 NOTE — TOC Transition Note (Signed)
Transition of Care (TOC) - CM/SW Discharge Note ?Donn Pierini Charity fundraiser, BSN ?Transitions of Care ?Unit 4E- RN Case Manager ?See Treatment Team for direct phone #  ? ? ?Patient Details  ?Name: Whitney Sloan ?MRN: [id number] ?Date of Birth: 1956-04-27 ? ?Transition of Care (TOC) CM/SW Contact:  ?Zenda Alpers, Lenn Sink, RN ?Phone Number: ?09/02/2021, 3:21 PM ? ? ?Clinical Narrative:    ?Notified by CSW- Aram Beecham that family has decided not to go to SNF and prefers to return home w/ [facility] services. CM has notified MD and requested HH orders- per MD pt remains stable for transition home today.  ?CM spoke with pt at bedside- pt is happy about returning home, permission given for CM to call daughter Adele Barthel to discuss [facility] and DME needs.  ?Call made to [facility]- per conversation choice was offered Per CMS guidelines from medicare.gov website with star ratings (copy placed in shadow chart) - [facility] has selected [facility] as first choice, [facility] and [facility] as backup if [facility] unable to accept.  ?Address, phone #s and PCP all confirmed. Per daughter they are working on getting patient into PACE program. For now will stay with PCP- but CM has provided them with info on finding new PCP if needed until pt gets into PACE.  ? ?Also discussed DME- daughter declines hospital bed or any other DME needs other than a 3n1/BSC at this time. Will request DME order for 3n1. Daughter agreeable to use in house provider for DME, daughter will transport home later.  ? ?[facility] for RN/PT/OT/aide.  ? ?Call made to [facility]- DME referral- 3n1 to be delivered to room prior to discharge.  ? ? ?Final next level of care: Home w Home Health Services ?Barriers to Discharge: Barriers Resolved ? ? ?Patient Goals and CMS Choice ?Patient states their goals for this hospitalization and ongoing recovery are:: return home- working on getting into PACE program ?CMS Medicare.gov Compare Post Acute Care list provided to:: Patient  Represenative (must comment) ?Choice offered to / list presented to : Adult Children (daughter-[facility]) ? ?Discharge Placement ?  ?           ?  ? Home w/ HH ?  ?  ? ?Discharge Plan and Services ?In-house Referral: Clinical Social Work ?Discharge Planning Services: CM Consult ?Post Acute Care Choice: Durable Medical Equipment, Home Health, Skilled Nursing Facility          ?DME Arranged: 3-N-1 ?DME Agency: [facility] ?Date DME Agency Contacted: 09/02/21 ?Time DME Agency Contacted: 1520 ?Representative spoke with at DME Agency: Selena Batten ?HH Arranged: RN, Disease Management, PT, OT, Nurse's Aide ?HH Agency: [facility] ?Date HH Agency Contacted: 09/02/21 ?Time HH Agency Contacted: 1520 ?Representative spoke with at [facility] Agency: Kandee Keen ? ?Social Determinants of Health (SDOH) Interventions ?  ? ? ?Readmission Risk Interventions ? ?  09/02/2021  ?  3:21 PM  ?Readmission Risk Prevention Plan  ?Post Dischage Appt Complete  ?Medication Screening Complete  ?Transportation Screening Complete  ? ? ? ? ? ?

## 2021-09-02 NOTE — Progress Notes (Signed)
Physical Therapy Treatment ?Patient Details ?Name: Whitney Sloan ?MRN: [id number] ?DOB: 11-27-1955 ?Today's Date: 09/02/2021 ? ? ?History of Present Illness Pt is a 66 y.o. F who presents 08/30/2021 for evaluation of nausea, vomiting cough, sore throat, generalized weakness. Chest x-ray shows chronic enlarged cardiac silhouette with bibasilar atelectasis. CT head negative for acute abnormality. Significant PMH: diastolic CHF, insulin dependent T2DM, HTN, HLD, morbid obesity. ? ?  ?PT Comments  ? ? Pt called back to room to assist with return transfer from chair>bed due to upcoming DC per RN. Pt with slightly improved command following in second session and continues to require +1 modA for standing transfer and pivotal steps from chair>bed with RW support. Pt continues to benefit from PT services to progress toward functional mobility goals. Continue to recommend SNF, however DME recommendations below remain appropriate if family instead chooses to take pt home.   ?Recommendations for follow up therapy are one component of a multi-disciplinary discharge planning process, led by the attending physician.  Recommendations may be updated based on patient status, additional functional criteria and insurance authorization. ? ?Follow Up Recommendations ? Skilled nursing-short term rehab (<3 hours/day) (per chart review pt plan to DC home will need HHPT if refusing SNF) ?  ?  ?Assistance Recommended at Discharge Frequent or constant Supervision/Assistance  ?Patient can return home with the following A lot of help with walking and/or transfers;A little help with bathing/dressing/bathroom;Assistance with cooking/housework;Direct supervision/assist for medications management;Assist for transportation ?  ?Equipment Recommendations ? BSC/3in1;Wheelchair (measurements PT);Wheelchair cushion (measurements PT);Hospital bed  ?  ?Recommendations for Other Services   ? ? ?  ?Precautions / Restrictions Precautions ?Precautions:  Fall ?Precaution Comments: lightheadedness 3/23 (BP stable) ?Restrictions ?Weight Bearing Restrictions: No  ?  ? ?Mobility ? Bed Mobility ?Overal bed mobility: Needs Assistance ?Bed Mobility: Rolling, Sit to Sidelying ?Rolling: Min assist ?Sidelying to sit: Mod assist, HOB elevated ?  ?  ?Sit to sidelying: Mod assist, +2 for safety/equipment ?General bed mobility comments: Pt needing trunk and BLE assist for return to supine, cues for log roll sequencing; needs minA and tactile cues for initiating roll toward her L side to return to supine ?  ? ?Transfers ?Overall transfer level: Needs assistance ?Equipment used: Rolling walker (2 wheels) ?Transfers: Sit to/from Stand, Bed to chair/wheelchair/BSC ?Sit to Stand: Mod assist, From elevated surface, +2 safety/equipment ?  ?Step pivot transfers: Mod assist, +2 safety/equipment ?  ?  ?  ?General transfer comment: from recliner and to EOB, cues for UE placement and use of momentum to achieve upright; small shuffled steps to EOB and sidesteps toward HOB; pt scooted x3 reps while seated toward [facility] wtih cues ?  ? ?Ambulation/Gait ?  ?  ?  ?  ?  ?  ?Pre-gait activities: small steps from bed>chair then forward/backward in front of chair, pt c/o "woozy" and fatigue on second standing trial and deferred further gait progression, but standing BP stable ?  ? ? ?Stairs ?  ?  ?  ?  ?  ? ? ?Wheelchair Mobility ?  ? ?Modified Rankin (Stroke Patients Only) ?  ? ? ?  ?Balance Overall balance assessment: Needs assistance ?Sitting-balance support: Feet supported ?Sitting balance-Leahy Scale: Fair ?  ?  ?Standing balance support: Bilateral upper extremity supported, Reliant on assistive device for balance ?Standing balance-Leahy Scale: Poor ?Standing balance comment: reliant on RW ?  ?  ?  ?  ?  ?  ?  ?  ?  ?  ?  ?  ? ?  ?  Cognition Arousal/Alertness: Awake/alert ?Behavior During Therapy: [facility] for tasks assessed/performed ?Overall Cognitive Status: Impaired/Different from baseline ?Area of  Impairment: Memory, Following commands, Problem solving ?  ?  ?  ?  ?  ?  ?  ?  ?  ?  ?Memory: Decreased short-term memory ?Following Commands: Follows one step commands with increased time ?  ?  ?Problem Solving: Slow processing, Requires verbal cues, Difficulty sequencing, Decreased initiation ?General Comments: Pt with delayed processing and requires increased time to follow 1 step commands and respond to questions, required cues for sequencing and problem solving. slightly improved following of cues for second session as she was more alert but still with decreased understanding of some simple cues. ?  ?  ? ?  ?Exercises Other Exercises ?Other Exercises: seated BLE AROM: LAQ, hip flexion x10 reps ea ?Other Exercises: supine BLE AROM: ankle pumps x10 reps ?Other Exercises: IS x 10 reps (200-379mL) she did perform these again in second session ? ?  ?General Comments General comments (skin integrity, edema, etc.): SpO2 WFL, HR 70's bpm ?  ?  ? ?Pertinent Vitals/Pain Pain Assessment ?Pain Assessment: No/denies pain ?Faces Pain Scale: Hurts little more ?Pain Location: bottom ?Pain Descriptors / Indicators: Discomfort, Grimacing, Guarding ?Pain Intervention(s): Monitored during session, Repositioned  ? ? ?Home Living   ?  ?  ?  ?  ?  ?  ?  ?  ?  ?   ?  ?Prior Function    ?  ?  ?   ? ?PT Goals (current goals can now be found in the care plan section) Acute Rehab PT Goals ?Patient Stated Goal: spend time with her daughter/grandkids ?PT Goal Formulation: With patient ?Time For Goal Achievement: 09/14/21 ?Progress towards PT goals: Progressing toward goals ? ?  ?Frequency ? ? ? Min 3X/week ? ? ? ?  ?PT Plan Current plan remains appropriate  ? ? ?Co-evaluation   ?  ?  ?  ?  ? ?  ?AM-PAC PT "6 Clicks" Mobility   ?Outcome Measure ? Help needed turning from your back to your side while in a flat bed without using bedrails?: A Little ?Help needed moving from lying on your back to sitting on the side of a flat bed without using  bedrails?: A Lot ?Help needed moving to and from a bed to a chair (including a wheelchair)?: A Lot ?Help needed standing up from a chair using your arms (e.g., wheelchair or bedside chair)?: A Lot ?Help needed to walk in hospital room?: Total ?Help needed climbing 3-5 steps with a railing? : Total ?6 Click Score: 11 ? ?  ?End of Session Equipment Utilized During Treatment: Gait belt ?Activity Tolerance: Patient tolerated treatment well;Patient limited by fatigue ?Patient left: with call bell/phone within reach;in bed;with bed alarm set ?Nurse Communication: Mobility status ?PT Visit Diagnosis: Unsteadiness on feet (R26.81);Muscle weakness (generalized) (M62.81);Difficulty in walking, not elsewhere classified (R26.2) ?  ? ? ?Time: 1610-9604 ?PT Time Calculation (min) (ACUTE ONLY): 18 min ? ?Charges:  $Therapeutic Exercise: 8-22 mins ?$Therapeutic Activity: 8-22 mins          ?          ? ?Evi Mccomb P., PTA ?Acute Rehabilitation Services ?Pager: [phone] ?Office: [phone]  ? ? ?Dorathy Kinsman Tsuneo Faison ?09/02/2021, 5:42 PM ? ?

## 2021-09-02 NOTE — Progress Notes (Addendum)
Physical Therapy Treatment ?Patient Details ?Name: Whitney Sloan ?MRN: [id number] ?DOB: 21-Jan-1956 ?Today's Date: 09/02/2021 ? ? ?History of Present Illness Pt is a 66 y.o. F who presents 08/30/2021 for evaluation of nausea, vomiting cough, sore throat, generalized weakness. Chest x-ray shows chronic enlarged cardiac silhouette with bibasilar atelectasis. CT head negative for acute abnormality. Significant PMH: diastolic CHF, insulin dependent T2DM, HTN, HLD, morbid obesity. ? ?  ?PT Comments  ? ? Pt received in supine, drowsy but participatory with encouragement, pt with slow processing and needs increased time and multimodal cues to initiate and perform bed mobility and transfers. Pt c/o feeling "woozy" with standing trials but BP stable sitting to standing. Pt needing up to modA and increased time/cues for bed mobility, sit<>stand from bed and chair heights and pivotal steps. Pt unable to progress gait today due to fatigue. When asked about disposition, pt states she is still planning for rehab prior to discharge home however per chart review pt family plans to take her home.   ?Recommendations for follow up therapy are one component of a multi-disciplinary discharge planning process, led by the attending physician.  Recommendations may be updated based on patient status, additional functional criteria and insurance authorization. ? ?Follow Up Recommendations ? Skilled nursing-short term rehab (<3 hours/day) ?  ?  ?Assistance Recommended at Discharge Frequent or constant Supervision/Assistance  ?Patient can return home with the following A lot of help with walking and/or transfers;A little help with bathing/dressing/bathroom;Assistance with cooking/housework;Direct supervision/assist for medications management;Assist for transportation ?  ?Equipment Recommendations ? BSC/3in1;Wheelchair (measurements PT);Wheelchair cushion (measurements PT);Hospital bed  ?  ?Recommendations for Other Services   ? ? ?  ?Precautions /  Restrictions Precautions ?Precautions: Fall ?Precaution Comments: lightheadedness 3/23 (BP stable), flexi-seal and catheter ?Restrictions ?Weight Bearing Restrictions: No  ?  ? ?Mobility ? Bed Mobility ?Overal bed mobility: Needs Assistance ?Bed Mobility: Rolling, Sidelying to Sit ?Rolling: Min guard ?Sidelying to sit: Mod assist, HOB elevated ?  ?  ?  ?General bed mobility comments: Pt initiating moving BLE's to edge of bed with cues but stops prior to completing transition to EOB needing tactile cues and requiring assist to scoot hips forward with bed pad, and heavy modA trunk assist to upright vc for sequencing - pt needed physical and cog assist to complete ?  ? ?Transfers ?Overall transfer level: Needs assistance ?Equipment used: Rolling walker (2 wheels) ?Transfers: Sit to/from Stand, Bed to chair/wheelchair/BSC ?Sit to Stand: Mod assist, From elevated surface ?  ?Step pivot transfers: Mod assist ?  ?  ?  ?General transfer comment: Pt initially requiring modA to rise from edge of bed, pulling up on RW to stand. Pt also needs modA to stand from recliner chair to RW with use of momentum strategy. ?  ? ?Ambulation/Gait ?  ?  ?  ?  ?  ?  ?Pre-gait activities: small steps from bed>chair then forward/backward in front of chair, pt c/o "woozy" and fatigue on second standing trial and deferred further gait progression, but standing BP stable ?  ? ? ? ?  ?Balance Overall balance assessment: Needs assistance ?Sitting-balance support: Feet supported ?Sitting balance-Leahy Scale: Fair ?  ?  ?Standing balance support: Bilateral upper extremity supported, Reliant on assistive device for balance ?Standing balance-Leahy Scale: Poor ?Standing balance comment: reliant on RW ?  ?  ?  ? ?  ?Cognition Arousal/Alertness: Awake/alert ?Behavior During Therapy: [facility] for tasks assessed/performed ?Overall Cognitive Status: Impaired/Different from baseline ?Area of Impairment: Memory, Following commands, Problem solving ?  ?  ?  Memory:  Decreased short-term memory ?Following Commands: Follows one step commands with increased time ?  ?  ?Problem Solving: Slow processing, Requires verbal cues, Difficulty sequencing, Decreased initiation ?General Comments: Pt with delayed processing and requires increased time to follow 1 step commands and respond to questions, required cues for sequencing and problem solving tasks like getting to the EOB and with poor carryover of cues for safe hand placement within session. ?  ?  ? ?  ?Exercises Other Exercises ?Other Exercises: seated BLE AROM: LAQ, hip flexion x10 reps ea ?Other Exercises: supine BLE AROM: ankle pumps x10 reps ?Other Exercises: IS x 10 reps (200-363mL) ? ?  ?General Comments General comments (skin integrity, edema, etc.): SpO2 93-97% on RA; HR 60's-70's bpm, BP 139/71 seated and BP 138/88 standing at RW, pt c/o wooziness ?  ?  ? ?Pertinent Vitals/Pain Pain Assessment ?Pain Assessment: Faces ?Faces Pain Scale: Hurts little more ?Pain Location: bottom ?Pain Descriptors / Indicators: Discomfort, Grimacing, Guarding ?Pain Intervention(s): Limited activity within patient's tolerance, Monitored during session, Repositioned, Other (comment) (up in chair with pillows under her, asked secretary to order geomat cushion)  ? ? ? ?PT Goals (current goals can now be found in the care plan section) Acute Rehab PT Goals ?Patient Stated Goal: spend time with her daughter/grandkids ?PT Goal Formulation: With patient ?Time For Goal Achievement: 09/14/21 ?Progress towards PT goals: Progressing toward goals ? ?  ?Frequency ? ? ? Min 3X/week ? ? ? ?  ?PT Plan Current plan remains appropriate  ? ? ?   ?AM-PAC PT "6 Clicks" Mobility   ?Outcome Measure ? Help needed turning from your back to your side while in a flat bed without using bedrails?: A Little ?Help needed moving from lying on your back to sitting on the side of a flat bed without using bedrails?: A Lot ?Help needed moving to and from a bed to a chair  (including a wheelchair)?: A Lot ?Help needed standing up from a chair using your arms (e.g., wheelchair or bedside chair)?: A Lot ?Help needed to walk in hospital room?: Total ?Help needed climbing 3-5 steps with a railing? : Total ?6 Click Score: 11 ? ?  ?End of Session Equipment Utilized During Treatment: Gait belt ?Activity Tolerance: Patient tolerated treatment well;Patient limited by fatigue ?Patient left: in chair;with call bell/phone within reach;with chair alarm set ?Nurse Communication: Mobility status ?PT Visit Diagnosis: Unsteadiness on feet (R26.81);Muscle weakness (generalized) (M62.81);Difficulty in walking, not elsewhere classified (R26.2) ?  ? ? ?Time: 8295-6213 ?PT Time Calculation (min) (ACUTE ONLY): 41 min ? ?Charges:  $Therapeutic Exercise: 8-22 mins ?$Therapeutic Activity: 23-37 mins          ?          ? ?Oleda Borski P., PTA ?Acute Rehabilitation Services ?Pager: [phone] ?Office: [phone]  ? ? ?Dorathy Kinsman Amberlin Utke ?09/02/2021, 3:03 PM ? ?

## 2021-09-02 NOTE — Progress Notes (Signed)
?PROGRESS NOTE ? ? ? ?Whitney Sloan  [id number] DOB: Sep 22, 1955 DOA: 08/30/2021 ?PCP: Camillia Herter, NP  ? ?Brief Narrative:  ?66 year old with history of diastolic CHF EF A999333, grade 2 DD, insulin-dependent DM 2, HTN, HLD, morbid obesity comes to the hospital with complains of nausea, vomiting, cough and sore throat.  SHe has generally been feeling weak over the past 3 days prior to admission.  Typically uses walker/cane to ambulate but unable to do so.  Upon admission CT head was negative, she was hyperglycemic, COVID/flu negative.  She was found to be in acute kidney injury with uncontrolled DM 2.  During the hospital course patient received IV antibiotics for urinary tract infection, nephrotoxic drugs were held and received oral diuretics.  Renal function continued to improve slowly.  PT recommended SNF therefore arrangements being made with the help of TOC team ? ? ?Assessment & Plan: ? Principal Problem: ?  Uncontrolled type 2 diabetes mellitus with hyperglycemia, with long-term current use of insulin (HCC) ?Active Problems: ?  UTI (urinary tract infection) ?  AKI (acute kidney injury) ([place]) ?  Elevated troponin ?  Generalized weakness ?  (HFpEF) heart failure with preserved ejection fraction ([place]) ?  Hypertension associated with diabetes ([place]) ?  Mixed diabetic hyperlipidemia associated with type 2 diabetes mellitus ([place]) ?  Acute encephalopathy ?  Pressure injury of skin ?  ? ? ?Assessment and Plan: ?* Uncontrolled type 2 diabetes mellitus with hyperglycemia, with long-term current use of insulin ([place]) ?Hyperglycemic without evidence of DKA/HHS in setting of nonadherence to insulin and dietary indiscretion.  Hemoglobin A1c >15 on 3/17. ?-Metformin on hold.  Continue long-acting, Premeal insulin.  Sliding scale Accu-Cheks ? ?UTI (urinary tract infection) ?Ucx- multiple species. Empiric IV Roc.  ?Concerns of yeast infection.  Fluconazole p.o.once given 3/22. Nystatin powder. ? ?AKI (acute kidney injury)  ([place]) ?Admission creatinine 1.4, gradually improving.  On metformin and losartan on hold.  Creatinine today 1.04 ?Baseline creatinine 0.8 ? ? ? ?Generalized weakness ?Secondary to deconditioning, body habitus, viral URI. ?PT/OT-recommending SNF.  TOC consult ? ?Elevated troponin ?hronically elevated, currently chest pain-free.  Repeat echo shows EF 50%, grade 2 DD ?Had abnormal stress back in July 2021 but she had declined left heart catheterization. ? ? ? ? ?(HFpEF) heart failure with preserved ejection fraction ([place]) ?Due to obesity difficult to assess volume status.  Did receive IV bolus in the ED. p.o. Lasix.   Echocardiogram shows EF of [id number], grade 2 diastolic dysfunction. ? ? ? ?Hypertension associated with diabetes ([place]) ?Resume home Coreg 25 mg twice daily, diltiazem to 40 mg twice daily, Lasix BID.  Hold losartan in setting of AKI. ? ?Mixed diabetic hyperlipidemia associated with type 2 diabetes mellitus ([place]) ?Continue atorvastatin and Zetia. ? ?Acute encephalopathy ?Improved.  Likely from UTI but this has improved.  No focal neurodeficits.  TSH normal.  Ammonia was slightly elevated therefore received lactulose. ? ? ? ?DVT prophylaxis: Lovenox ?Code Status: Full Code ?Family Communication:  Lolita Lenz- updated ? ?TOC consulted SNF ? ? ?  ? ?  ? ?Body mass index is 40.98 kg/m?. ? ?Pressure Injury 08/31/21 Buttocks Right;Left;Mid Stage 2 -  Partial thickness loss of dermis presenting as a shallow open injury with a red, pink wound bed without slough. Moisture associated damage- redness multi small opean redden areas (Active)  ?08/31/21 1355  ?Location: Buttocks  ?Location Orientation: Right;Left;Mid  ?Staging: Stage 2 -  Partial thickness loss of dermis presenting as a shallow open injury with  a red, pink wound bed without slough.  ?Wound Description (Comments): Moisture associated damage- redness multi small opean redden areas  ?Present on Admission: Yes  ?Dressing Type None 09/02/21 0845   ? ? ? ? ? ? ? ? ?Subjective: ?Feels better today, no new complaints.  ?Daughter updated as well.  ? ? ?Examination: ? ?General exam: Appears calm and comfortable  ?Respiratory system: diminished b/l at the bases.   ?Cardiovascular system: S1 & S2 heard, RRR. No JVD, murmurs, rubs, gallops or clicks. 2+ b/l LE edema ?Gastrointestinal system: Abdomen is nondistended, soft and nontender. No organomegaly or masses felt. Normal bowel sounds heard. ?Central nervous system: Alert and oriented. No focal neurological deficits. ?Extremities: Symmetric 5 x 5 power. ?Skin: No rashes, lesions or ulcers ?Psychiatry: Judgement and insight appear normal. Mood & affect appropriate.  ? ? ? ?Objective: ?Vitals:  ? 09/01/21 2358 09/02/21 0435 09/02/21 [id number] 09/02/21 1144  ?BP: 133/77 (!) 157/89 (!) 151/81 138/72  ?Pulse: 65 66 66 63  ?Resp: 20 20 20 20   ?Temp: 98.3 ?F (36.8 ?C) 98.7 ?F (37.1 ?C) 98.6 ?F (37 ?C) 98.8 ?F (37.1 ?C)  ?TempSrc: Oral Oral Oral Oral  ?SpO2: 97% 98% 97% 97%  ?Weight:  106.6 kg    ?Height:      ? ? ?Intake/Output Summary (Last 24 hours) at 09/02/2021 1204 ?Last data filed at 09/02/2021 0930 ?Gross per 24 hour  ?Intake 820 ml  ?Output 925 ml  ?Net -105 ml  ? ?Filed Weights  ? 08/30/21 1858 09/01/21 0525 09/02/21 0435  ?Weight: 108.9 kg 106.2 kg 106.6 kg  ? ? ? ?Data Reviewed:  ? ?CBC: ?Recent Labs  ?Lab 08/30/21 ?1920 08/30/21 ?1944 08/31/21 ?0144  ?WBC 7.2  --  8.3  ?NEUTROABS 4.3  --   --   ?HGB 12.5 14.3 12.9  ?HCT 40.3 42.0 39.9  ?MCV 87.2  --  85.4  ?PLT 189  --  142*  ? ?Basic Metabolic Panel: ?Recent Labs  ?Lab 08/27/21 ?1404 08/30/21 ?1920 08/30/21 ?1944 08/31/21 ?0144 09/01/21 ?0120 09/02/21 ?0128  ?NA 139 131* 132* 134* 140 138  ?K 3.8 4.2 4.0 3.7 3.8 3.1*  ?CL 98 93* 94* 97* 101 105  ?CO2 34* 25  --  24 27 27   ?GLUCOSE 429* 461* 468* 302* 204* 125*  ?BUN 9 30* 35* 28* 28* 21  ?CREATININE 0.83 1.39* 1.40* 1.22* 1.21* 1.04*  ?CALCIUM 9.1 8.2*  --  7.9* 8.5* 8.1*  ?MG  --   --   --  1.7 1.9 1.8   ? ?GFR: ?Estimated Creatinine Clearance: 62.8 mL/min (A) (by C-G formula based on SCr of 1.04 mg/dL (H)). ?Liver Function Tests: ?Recent Labs  ?Lab 08/30/21 ?1920  ?AST 26  ?ALT 14  ?ALKPHOS 52  ?BILITOT 0.7  ?PROT 6.7  ?ALBUMIN 2.4*  ? ?No results for input(s): LIPASE, AMYLASE in the last 168 hours. ?Recent Labs  ?Lab 08/31/21 ?1143  ?AMMONIA 41*  ? ?Coagulation Profile: ?Recent Labs  ?Lab 08/30/21 ?1920  ?INR 1.1  ? ?Cardiac Enzymes: ?Recent Labs  ?Lab 08/30/21 ?1920  ?CKTOTAL 136  ? ?BNP (last 3 results) ?No results for input(s): PROBNP in the last 8760 hours. ?HbA1C: ?No results for input(s): HGBA1C in the last 72 hours. ?CBG: ?Recent Labs  ?Lab 09/01/21 ?1215 09/01/21 ?1721 09/01/21 ?2150 09/02/21 ?[id number] 09/02/21 ?1139  ?GLUCAP 156* 125* 149* 122* 159*  ? ?Lipid Profile: ?No results for input(s): CHOL, HDL, LDLCALC, TRIG, CHOLHDL, LDLDIRECT in the last 72 hours. ?Thyroid Function  Tests: ?Recent Labs  ?  08/31/21 ?1143  ?TSH 1.306  ? ?Anemia Panel: ?No results for input(s): VITAMINB12, FOLATE, FERRITIN, TIBC, IRON, RETICCTPCT in the last 72 hours. ?Sepsis Labs: ?Recent Labs  ?Lab 08/30/21 ?1920 08/30/21 ?2251 08/31/21 ?1143 08/31/21 ?1441  ?PROCALCITON  --   --  0.15  --   ?LATICACIDVEN 2.3* 3.0* 1.3 1.6  ? ? ?Recent Results (from the past 240 hour(s))  ?Culture, blood (routine x 2)     Status: None (Preliminary result)  ? Collection Time: 08/30/21  7:20 PM  ? Specimen: BLOOD  ?Result Value Ref Range Status  ? Specimen Description BLOOD LEFT ANTECUBITAL  Final  ? Special Requests   Final  ?  BOTTLES DRAWN AEROBIC AND ANAEROBIC Blood Culture adequate volume  ? Culture   Final  ?  NO GROWTH 3 DAYS ?Performed at [facility], [place] [address], [place], [place] [address] ?  ? Report Status PENDING  Incomplete  ?Culture, blood (routine x 2)     Status: None (Preliminary result)  ? Collection Time: 08/30/21  7:35 PM  ? Specimen: BLOOD  ?Result Value Ref Range Status  ? Specimen Description BLOOD RIGHT ANTECUBITAL   Final  ? Special Requests   Final  ?  BOTTLES DRAWN AEROBIC AND ANAEROBIC Blood Culture adequate volume  ? Culture   Final  ?  NO GROWTH 3 DAYS ?Performed at [facility], [place] [address], [place], [place] [address] ?  ? R

## 2021-09-04 LAB — CULTURE, BLOOD (ROUTINE X 2)
Culture: NO GROWTH
Culture: NO GROWTH
Special Requests: ADEQUATE
Special Requests: ADEQUATE

## 2021-09-06 ENCOUNTER — Telehealth: Payer: Self-pay

## 2021-09-06 ENCOUNTER — Ambulatory Visit (HOSPITAL_BASED_OUTPATIENT_CLINIC_OR_DEPARTMENT_OTHER): Payer: Medicare Other | Admitting: Family

## 2021-09-06 ENCOUNTER — Ambulatory Visit: Payer: Medicare Other | Admitting: General Practice

## 2021-09-06 NOTE — Progress Notes (Deleted)
? ?Office Visit  ?  ?Patient Name: Whitney Sloan ?Date of Encounter: 09/06/2021 ? ?PCP:  Camillia Herter, NP ?  ?[place]  ?Cardiologist:  Donato Heinz, MD  ?Advanced Practice Provider:  No care team member to display ?Electrophysiologist:  None  ?   ? ?Chief Complaint  ?  ?Whitney Sloan is a 66 y.o. female with a hx of chronic diastolic heart failure, DM2, hypertension, hyperlipidemia, obesity, CVA 2011 presents today for hospital follow-up ? ?Past Medical History  ?  ?Past Medical History:  ?Diagnosis Date  ? Abnormal stress test 12/31/2019  ? medium defect of moderate severity present in the basal inferior, mid inferior and apex location.  Findings consistent with ischemia  ? Chronic diastolic (congestive) heart failure (HCC)   ? Diabetes mellitus   ? Hyperlipemia   ? Hypertension   ? Psoriasis   ? ?Past Surgical History:  ?Procedure Laterality Date  ? ANAL FISSURE REPAIR    ? ENDOMETRIAL ABLATION    ? ? ?Allergies ? ?Allergies  ?Allergen Reactions  ? Oxycodone-Acetaminophen Nausea And Vomiting  ? ? ?History of Present Illness  ?  ?Whitney Sloan is a 66 y.o. female with a hx of chronic diastolic heart failure, DM 2, hypertension, hyperlipidemia, obesity, CVA 2011 last seen 03/2020 by Dr. Nechama Guard. ? ?*** ?Admitted 3/20 - 09/02/2021 after presenting with weakness.  Noted to be hyperglycemic without evidence of DKA/HHS in the setting of nonadherence to insulin and dietary indiscretion.  Also treated for UTI with empiric IV Rocephin which was transitioned to p.o. Keflex.  She had AKI with creatinine 1.4 on admission which gradually improved.  Metformin and losartan were held.  Weakness thought to be secondary to deconditioning and she was recommended to SNF but declined and went home with home health.  Her troponin was noted to be chronically elevated with echocardiogram during admission showing LVEF A999333, grade diastolic dysfunction. ? ?She presents today for follow-up.  *** ? ?EKGs/Labs/Other Studies Reviewed:  ? ?The following studies were reviewed today: ? ?Echo 08/31/2021 ?1. Left ventricular ejection fraction, by estimation, is 50%. The left  ?ventricle has low normal function. The left ventricle has no regional wall  ?motion abnormalities. There is moderate concentric left ventricular  ?hypertrophy. Left ventricular diastolic  ? parameters are consistent with Grade II diastolic dysfunction  ?(pseudonormalization). Elevated left atrial pressure.  ? 2. Right ventricular systolic function is normal. The right ventricular  ?size is normal. Mildly increased right ventricular wall thickness.  ?Tricuspid regurgitation signal is inadequate for assessing PA pressure.  ? 3. Left atrial size was mildly dilated.  ? 4. The pericardial effusion is anterior to the right ventricle.  ? 5. The mitral valve is grossly normal. No evidence of mitral valve  ?regurgitation. No evidence of mitral stenosis.  ? 6. The aortic valve is tricuspid. There is moderate calcification of the  ?aortic valve. Aortic valve regurgitation is not visualized. Aortic valve  ?sclerosis is present, with no evidence of aortic valve stenosis.  ? 7. The inferior vena cava is dilated in size with >50% respiratory  ?variability, suggesting right atrial pressure of 8 mmHg.  ? ?Myoview 12/2019 ?The left ventricular ejection fraction is moderately decreased (30-44%). ?Nuclear stress EF: 39%. ?No T wave inversion was noted during stress. ?There was no ST segment deviation noted during stress. ?Defect 1: There is a medium defect of moderate severity present in the basal inferior, mid inferior and apex location. ?Findings consistent with  ischemia. ?This is an intermediate risk study. ?  ?Medium size, moderate intensity reversible (SDS 4) inferior perfusion defect, suggestive of ischemia. LVEF 39%, mildly dilated LV with more prominent inferior hypokinesis. This is an intermediate risk study. Clinical correlation is advised and  further testing may be necessary. ? ?EKG: No EKG today ? ?Recent Labs: ?08/30/2021: ALT 14 ?08/31/2021: Hemoglobin 12.9; Platelets 142; TSH 1.306 ?09/02/2021: B Natriuretic Peptide 163.4; BUN 21; Creatinine, Ser 1.04; Magnesium 1.8; Potassium 3.1; Sodium 138  ?Recent Lipid Panel ?   ?Component Value Date/Time  ? CHOL 190 08/27/2021 1404  ? TRIG 141.0 08/27/2021 1404  ? HDL 65.60 08/27/2021 1404  ? CHOLHDL 3 08/27/2021 1404  ? VLDL 28.2 08/27/2021 1404  ? [place] 96 08/27/2021 1404  ? ? ?Home Medications  ? ?No outpatient medications have been marked as taking for the 09/06/21 encounter (Appointment) with Loel Dubonnet, NP.  ?  ? ?Review of Systems  ?    ?All other systems reviewed and are otherwise negative except as noted above. ? ?Physical Exam  ?  ?VS:  There were no vitals taken for this visit. , BMI There is no height or weight on file to calculate BMI. ? ?Wt Readings from Last 3 Encounters:  ?09/02/21 235 lb 0.2 oz (106.6 kg)  ?08/27/21 245 lb (111.1 kg)  ?03/24/21 234 lb (106.1 kg)  ?  ? ?GEN: Well nourished, well developed, in no acute distress. ?HEENT: normal. ?Neck: Supple, no JVD, carotid bruits, or masses. ?Cardiac: ***RRR, no murmurs, rubs, or gallops. No clubbing, cyanosis, edema.  ***Radials/PT 2+ and equal bilaterally.  ?Respiratory:  ***Respirations regular and unlabored, clear to auscultation bilaterally. ?GI: Soft, nontender, nondistended. ?MS: No deformity or atrophy. ?Skin: Warm and dry, no rash. ?Neuro:  Strength and sensation are intact. ?Psych: Normal affect. ? ?Assessment & Plan  ?  ?DM2 -08/27/2021 A1c greater than 15.  Intolerant to Rybelsus and SGLT2 inhibitors. *** ? ?Generalized weakness -recommended for SNF but during recent hospitalization but declined and was discharged with home health.*** ? ?Chronically elevated troponin -  ? ?HFpEF -  ? ?Hyperlipidemia -  ? ?Hypertension -  ? ?Disposition: Follow up {follow up:15908} with Donato Heinz, MD or APP. ? ?Signed, ?Loel Dubonnet, NP ?09/06/2021, 10:38 AM ?[place] ?

## 2021-09-06 NOTE — Telephone Encounter (Signed)
Transition Care Management Follow-up Telephone Call ?Date of discharge and from where: 09/02/2021, [facility]  ?How have you been since you were released from the hospital? I explained that I was calling on behalf of her PCP to see how she is doing since she returned home from the hospital . She said she has no PCP.  I explained that Ricky Stabs, NP is listed as her PCP. The patient stated that she is not her PCP and she is not going back, she wants an MD.  I offered to schedule her with an MD at [facility] or another community care clinic and she declined and said that she is going to have another PCP.  ?

## 2021-09-07 ENCOUNTER — Telehealth: Payer: Self-pay | Admitting: Family

## 2021-09-07 NOTE — Telephone Encounter (Unsigned)
Copied from CRM [id number]. Topic: Quick Communication - Home Health Verbal Orders ?>> Sep 07, 2021 10:02 AM Marylen Ponto wrote: ?Caller/Agency: Threasa Alpha with [facility] ?Callback Number: [phone] ?Requesting OT/PT/Skilled Nursing/Social Work/Speech Therapy: PT ?Frequency: 2 times a week for 4 weeks ? ?* Pt has swelling in both ankles + 2, mildly reduces lung sounds globally, and struggling to weigh accurately due to poor balance ?

## 2021-09-09 ENCOUNTER — Other Ambulatory Visit: Payer: Self-pay | Admitting: Family

## 2021-09-09 DIAGNOSIS — R2689 Other abnormalities of gait and mobility: Secondary | ICD-10-CM

## 2021-09-09 NOTE — Telephone Encounter (Signed)
Physical Therapy order entered as requested.

## 2021-09-16 ENCOUNTER — Other Ambulatory Visit (HOSPITAL_COMMUNITY): Payer: Self-pay

## 2021-09-28 ENCOUNTER — Other Ambulatory Visit: Payer: Self-pay | Admitting: Internal Medicine

## 2021-09-29 LAB — COMPLETE METABOLIC PANEL WITH GFR
AG Ratio: 1.2 (calc) (ref 1.0–2.5)
ALT: 9 U/L (ref 6–29)
AST: 11 U/L (ref 10–35)
Albumin: 3.6 g/dL (ref 3.6–5.1)
Alkaline phosphatase (APISO): 62 U/L (ref 37–153)
BUN: 9 mg/dL (ref 7–25)
CO2: 28 mmol/L (ref 20–32)
Calcium: 8.9 mg/dL (ref 8.6–10.4)
Chloride: 102 mmol/L (ref 98–110)
Creat: 0.68 mg/dL (ref 0.50–1.05)
Globulin: 3 g/dL (calc) (ref 1.9–3.7)
Glucose, Bld: 161 mg/dL — ABNORMAL HIGH (ref 65–99)
Potassium: 3.7 mmol/L (ref 3.5–5.3)
Sodium: 142 mmol/L (ref 135–146)
Total Bilirubin: 0.4 mg/dL (ref 0.2–1.2)
Total Protein: 6.6 g/dL (ref 6.1–8.1)
eGFR: 96 mL/min/{1.73_m2} (ref >=60)

## 2021-09-29 LAB — CBC
HCT: 37.3 % (ref 35.0–45.0)
Hemoglobin: 12 g/dL (ref 11.7–15.5)
MCH: 27.1 pg (ref 27.0–33.0)
MCHC: 32.2 g/dL (ref 32.0–36.0)
MCV: 84.4 fL (ref 80.0–100.0)
MPV: 11.6 fL (ref 7.5–12.5)
Platelets: 264 10*3/uL (ref 140–400)
RBC: 4.42 10*6/uL (ref 3.80–5.10)
RDW: 14.2 % (ref 11.0–15.0)
WBC: 8 10*3/uL (ref 3.8–10.8)

## 2021-09-29 LAB — LIPID PANEL
Cholesterol: 194 mg/dL (ref <200)
HDL: 67 mg/dL (ref >=50)
LDL Cholesterol (Calc): 106 mg/dL (calc) — ABNORMAL HIGH
Non-HDL Cholesterol (Calc): 127 mg/dL (calc) (ref <130)
Total CHOL/HDL Ratio: 2.9 (calc) (ref <5.0)
Triglycerides: 112 mg/dL (ref <150)

## 2021-09-29 LAB — URIC ACID: Uric Acid, Serum: 8.6 mg/dL — ABNORMAL HIGH (ref 2.5–7.0)

## 2021-09-29 LAB — TSH: TSH: 2.1 mIU/L (ref 0.40–4.50)

## 2021-09-29 LAB — VITAMIN D 25 HYDROXY (VIT D DEFICIENCY, FRACTURES): Vit D, 25-Hydroxy: 14 ng/mL — ABNORMAL LOW (ref 30–100)

## 2021-10-04 ENCOUNTER — Other Ambulatory Visit: Payer: Self-pay

## 2021-10-04 MED ORDER — METFORMIN HCL 500 MG PO TABS: 500.0000 mg | ORAL_TABLET | Freq: Two times a day (BID) | ORAL | 3 refills | Status: DC

## 2021-10-04 MED ORDER — LANTUS SOLOSTAR 100 UNIT/ML ~~LOC~~ SOPN: 60.0000 [IU] | PEN_INJECTOR | Freq: Every day | SUBCUTANEOUS | 3 refills | Status: DC

## 2021-10-04 NOTE — Telephone Encounter (Signed)
Script sent to [facility] 

## 2021-12-12 ENCOUNTER — Emergency Department (HOSPITAL_BASED_OUTPATIENT_CLINIC_OR_DEPARTMENT_OTHER)
Admission: EM | Admit: 2021-12-12 | Discharge: 2021-12-12 | Disposition: A | Discharge status: Home / Self Care | Payer: Medicare Other | Attending: Emergency Medicine | Admitting: Emergency Medicine

## 2021-12-12 ENCOUNTER — Emergency Department (HOSPITAL_BASED_OUTPATIENT_CLINIC_OR_DEPARTMENT_OTHER): Payer: Medicare Other

## 2021-12-12 ENCOUNTER — Encounter (HOSPITAL_BASED_OUTPATIENT_CLINIC_OR_DEPARTMENT_OTHER): Payer: Self-pay | Admitting: Obstetrics and Gynecology

## 2021-12-12 ENCOUNTER — Other Ambulatory Visit: Payer: Self-pay

## 2021-12-12 DIAGNOSIS — Z794 Long term (current) use of insulin: Secondary | ICD-10-CM | POA: Diagnosis not present

## 2021-12-12 DIAGNOSIS — Z79899 Other long term (current) drug therapy: Secondary | ICD-10-CM | POA: Insufficient documentation

## 2021-12-12 DIAGNOSIS — R0602 Shortness of breath: Secondary | ICD-10-CM | POA: Diagnosis present

## 2021-12-12 DIAGNOSIS — I509 Heart failure, unspecified: Secondary | ICD-10-CM | POA: Diagnosis not present

## 2021-12-12 DIAGNOSIS — Z7982 Long term (current) use of aspirin: Secondary | ICD-10-CM | POA: Insufficient documentation

## 2021-12-12 LAB — CBC
HCT: 35 % — ABNORMAL LOW (ref 36.0–46.0)
Hemoglobin: 10.9 g/dL — ABNORMAL LOW (ref 12.0–15.0)
MCH: 26.5 pg (ref 26.0–34.0)
MCHC: 31.1 g/dL (ref 30.0–36.0)
MCV: 85.2 fL (ref 80.0–100.0)
Platelets: 246 10*3/uL (ref 150–400)
RBC: 4.11 MIL/uL (ref 3.87–5.11)
RDW: 15 % (ref 11.5–15.5)
WBC: 9.3 10*3/uL (ref 4.0–10.5)
nRBC: 0 % (ref 0.0–0.2)

## 2021-12-12 LAB — TROPONIN I (HIGH SENSITIVITY): Troponin I (High Sensitivity): 29 ng/L — ABNORMAL HIGH (ref <18)

## 2021-12-12 LAB — BASIC METABOLIC PANEL
Anion gap: 11 (ref 5–15)
BUN: 25 mg/dL — ABNORMAL HIGH (ref 8–23)
CO2: 26 mmol/L (ref 22–32)
Calcium: 9.9 mg/dL (ref 8.9–10.3)
Chloride: 103 mmol/L (ref 98–111)
Creatinine, Ser: 0.94 mg/dL (ref 0.44–1.00)
GFR, Estimated: >60 mL/min (ref >60)
Glucose, Bld: 255 mg/dL — ABNORMAL HIGH (ref 70–99)
Potassium: 4.3 mmol/L (ref 3.5–5.1)
Sodium: 140 mmol/L (ref 135–145)

## 2021-12-12 LAB — BRAIN NATRIURETIC PEPTIDE: B Natriuretic Peptide: 114.5 pg/mL — ABNORMAL HIGH (ref 0.0–100.0)

## 2021-12-12 NOTE — ED Triage Notes (Signed)
Patient reports for the last two days she has been having left leg pain when walking. Patient reports she has been taking tylenol without relief. Patient reports the pain is occurring at the front of her knee down to her ankle. Patient has pitting edema in bilateral legs. Patient reports she is also feeling some shortness of breath with walking.

## 2021-12-12 NOTE — ED Provider Notes (Signed)
[facility] EMERGENCY DEPT Provider Note   CSN: [id number] Arrival date & time: 12/12/21  1832     History  Chief Complaint  Patient presents with   Leg Pain    Whitney Sloan is a 66 y.o. female with history of congestive heart failure presenting to the ED with generalized weakness, left leg injury, shortness of breath.  Patient reports that about a week ago she tripped and fell onto her left knee, and has been having pain and difficulty bearing weight on the left knee since then.  She reports that she felt that she has been progressively more short of breath the past several days, particularly with exertion, and is limited mobility due to the shortness of breath.  She denies chest pain or pressure.  She is on torsemide and takes this daily.  Most recent echocardiogram was in March of this year, with an EF of 50%, per my review of external records  HPI     Home Medications Prior to Admission medications   Medication Sig Start Date End Date Taking? Authorizing Provider  acetaminophen (TYLENOL) 500 MG tablet Take 500 mg by mouth every 6 (six) hours as needed for moderate pain or headache.    [provider]  aspirin (ASPIR-LOW) 81 MG EC tablet Take 81 mg by mouth every morning.    [provider]  atorvastatin (LIPITOR) 20 MG tablet Take 1 tablet (20 mg total) by mouth every evening. 08/30/21   Shamleffer, Konrad Dolores, MD  carvedilol (COREG) 25 MG tablet Take 1 tablet (25 mg total) by mouth 2 (two) times daily. 12/18/20 12/18/21  Albertine Grates, MD  Chlorphen-Phenyleph-ASA (ALKA-SELTZER PLUS COLD PO) Take 2 tablets by mouth every 6 (six) hours as needed (cold symptoms).    [provider]  clotrimazole (GYNE-LOTRIMIN) 1 % vaginal cream Place 1 Applicatorful vaginally daily as needed (yeast).    [provider]  Diclofenac Sodium 3 % GEL Apply 2 g topically 2 (two) times daily as needed. Patient taking differently: Apply 2 g topically 2 (two)  times daily as needed (pain). 04/10/20   Nestor Ramp, MD  DILT-XR 240 MG 24 hr capsule Take 240 mg by mouth daily. 02/18/21   [provider]  ELDERBERRY PO Take 1 tablet by mouth daily as needed (immune support). Gummy    [provider]  ezetimibe (ZETIA) 10 MG tablet Take 10 mg by mouth at bedtime. 08/21/19   [provider]  furosemide (LASIX) 80 MG tablet Take 1 tablet (80 mg total) by mouth 2 (two) times daily. 12/18/20   Albertine Grates, MD  insulin aspart (NOVOLOG FLEXPEN) 100 UNIT/ML FlexPen Inject 20 Units into the skin 3 (three) times daily with meals. Patient taking differently: Inject 20 Units into the skin 2 (two) times daily with a meal. 03/10/21   Shamleffer, Konrad Dolores, MD  insulin glargine (LANTUS SOLOSTAR) 100 UNIT/ML Solostar Pen Inject 60 Units into the skin daily. 10/04/21   Shamleffer, Konrad Dolores, MD  Insulin Pen Needle 32G X 4 MM MISC 1 Device by Does not apply route as directed. 03/26/20   Shamleffer, Konrad Dolores, MD  losartan (COZAAR) 100 MG tablet Take 1 tablet (100 mg total) by mouth daily. 08/30/21   Shamleffer, Konrad Dolores, MD  magnesium oxide (MAG-OX) 400 MG tablet Take 1 tablet (400 mg total) by mouth daily. Patient not taking: Reported on 08/30/2021 01/03/20   Little Ishikawa, MD  metFORMIN (GLUCOPHAGE) 500 MG tablet Take 1 tablet (500 mg  total) by mouth 2 (two) times daily with a meal. 10/04/21   Shamleffer, Konrad Dolores, MD  polyethylene glycol (MIRALAX / GLYCOLAX) 17 g packet Take 17 g by mouth daily as needed for mild constipation.    [provider]  potassium chloride SA (KLOR-CON) 20 MEQ tablet Take 1 tablet (20 mEq total) by mouth daily. Patient not taking: Reported on 08/30/2021 12/18/20 03/18/21  Albertine Grates, MD  Sennosides (EX-LAX) 15 MG TABS Take 15 mg by mouth daily as needed (constipation).    [provider]      Allergies    Oxycodone-acetaminophen    Review of Systems   Review of  Systems  Physical Exam Updated Vital Signs BP (!) 162/91   Pulse 79   Temp 98.2 F (36.8 C)   Resp 20   Ht 5\' 6"  (1.676 m)   Wt 109.8 kg   SpO2 97%   BMI 39.06 kg/m  Physical Exam Constitutional:      General: She is not in acute distress.    Appearance: She is obese.  HENT:     Head: Normocephalic and atraumatic.  Eyes:     Conjunctiva/sclera: Conjunctivae normal.     Pupils: Pupils are equal, round, and reactive to light.  Cardiovascular:     Rate and Rhythm: Normal rate and regular rhythm.  Pulmonary:     Effort: Pulmonary effort is normal. No respiratory distress.  Abdominal:     General: There is no distension.     Tenderness: There is no abdominal tenderness.  Musculoskeletal:     Comments: Left peripatellar effusion, patient does have full range of motion of the joint with some pain with knee flexion. Symmetrical lower extremity pitting edema  Skin:    General: Skin is warm and dry.  Neurological:     General: No focal deficit present.     Mental Status: She is alert. Mental status is at baseline.  Psychiatric:        Mood and Affect: Mood normal.        Behavior: Behavior normal.     ED Results / Procedures / Treatments   Labs (all labs ordered are listed, but only abnormal results are displayed) Labs Reviewed  BASIC METABOLIC PANEL - Abnormal; Notable for the following components:      Result Value   Glucose, Bld 255 (*)    BUN 25 (*)    All other components within normal limits  CBC - Abnormal; Notable for the following components:   Hemoglobin 10.9 (*)    HCT 35.0 (*)    All other components within normal limits  BRAIN NATRIURETIC PEPTIDE - Abnormal; Notable for the following components:   B Natriuretic Peptide 114.5 (*)    All other components within normal limits  TROPONIN I (HIGH SENSITIVITY) - Abnormal; Notable for the following components:   Troponin I (High Sensitivity) 29 (*)    All other components within normal limits    EKG EKG  Interpretation  Date/Time:  Sunday December 12 2021 19:03:53 EDT Ventricular Rate:  75 PR Interval:  144 QRS Duration: 88 QT Interval:  402 QTC Calculation: 448 R Axis:   16 Text Interpretation: Normal sinus rhythm When compared with ECG of 30-Aug-2021 19:18, PREVIOUS ECG IS PRESENT Confirmed by 01-Sep-2021 [phone]) on 12/12/2021 7:46:58 PM  Radiology DG Chest 1 View  Result Date: 12/12/2021 CLINICAL DATA:  Shortness of breath EXAM: CHEST  1 VIEW COMPARISON:  Frontal film from earlier in the same day  FINDINGS: No focal infiltrate is noted. The central vascular congestion seen on the prior exam is again noted. Mild degenerative changes of the thoracic spine are seen. IMPRESSION: Lateral view confirms the previously seen central vascular congestion. No other focal abnormality is noted. Electronically Signed   By: Alcide Clever M.D.   On: 12/12/2021 20:22   DG Knee Complete 4 Views Left  Result Date: 12/12/2021 CLINICAL DATA:  Fall injury with left knee pain. EXAM: LEFT KNEE - COMPLETE 4+ VIEW COMPARISON:  AP Lat left knee 04/03/2009. FINDINGS: Small to moderate-sized suprapatellar bursal effusion. There is mild anterior soft tissue fullness. Interval bone density loss with mild osteopenia. There is no evidence of fractures. Interval mild joint space loss in the medial femorotibial and patellofemoral compartments with increased mild tricompartmental osteophytosis. Interval increased patchy calcification in the femoral and popliteal trifurcation arteries. IMPRESSION: 1. Osteopenia and degenerative change without evidence of fractures. 2. Mild anterior soft tissue swelling. 3. Suprapatellar bursal effusion. 4. Interval increased atherosclerosis. Electronically Signed   By: Almira Bar M.D.   On: 12/12/2021 20:02   DG Chest Port 1 View  Result Date: 12/12/2021 CLINICAL DATA:  Shortness of breath and peripheral edema EXAM: PORTABLE CHEST 1 VIEW COMPARISON:  08/30/2021 FINDINGS: Cardiac shadow is enlarged.  Mild vascular congestion is noted. No significant edema is noted. No focal infiltrate is seen. Degenerative changes of the thoracic spine are noted. IMPRESSION: Changes of mild vascular congestion without edema Electronically Signed   By: Alcide Clever M.D.   On: 12/12/2021 19:27    Procedures Procedures    Medications Ordered in ED Medications - No data to display  ED Course/ Medical Decision Making/ A&P Clinical Course as of 12/13/21 7829  Wynelle Link Dec 12, 2021  2102 Discussed with patient and her daughter need for increasing diuresis suspect this is likely related to congestive heart failure.  They report to me that she does have a very high salt intake, cooks with salt and likes to eat fast food.  We did discuss how this could increase her risk for CHF exacerbation, discussed cutting out fast food and also with low-salt diet.  She verbalized understanding.  Okay for her to discharge.  For the next 7 days we will increase her torsemide dose from 40 mg to 60 mg daily.  She will follow-up with a cardiologist. [MT]    Clinical Course User Index [MT] Gerrick Ray, Kermit Balo, MD                           Medical Decision Making Amount and/or Complexity of Data Reviewed Labs: ordered. Radiology: ordered.   This patient presents to the ED with concern for shortness of breath, left leg. This involves an extensive number of treatment options, and is a complaint that carries with it a high risk of complications and morbidity.  The differential diagnosis includes knee fracture versus sprain versus effusion versus other  Shortness of breath differential could include CHF exacerbation versus pneumonia versus anemia versus other  Co-morbidities that complicate the patient evaluation: History of congestive heart failure at risk for CHF exacerbation  Supplemental hx provided by daughter at bedside, who describes patient's diet as unhealthy, lots of salt and fast food.  External records from outside source  obtained and reviewed including echocardiogram report as noted above  I ordered and personally interpreted labs.  The pertinent results include:  mildly elevated BNP.  Trop 29 (chronically elevated).  BMP and CBC  largely at baseline - mild hyperglycemia  I ordered imaging studies including x-ray of the chest, x-ray of the I independently visualized and interpreted imaging which showed no acute process, pulm edema noted I agree with the radiologist interpretation  The patient was maintained on a cardiac monitor.  I personally viewed and interpreted the cardiac monitored which showed an underlying rhythm of: Normal sinus rhythm  Per my interpretation the patient's ECG shows normal sinus rhythm no acute ischemic finding  I have reviewed the patients home medicines and have made adjustments as needed  Test Considered: Lower clinical suspicion for acute PE at this time - did not feel CT PE indicated; clinical presentation more consistent with CHF 2/2 high salt intake  After the interventions noted above, I reevaluated the patient and found that they have: stayed the same  No hypoxia, no tachypnea, breathing comfortably in ED.  Dispostion:  After consideration of the diagnostic results and the patients response to treatment, I feel that the patent would benefit from outpatient f/u with her cardiologist..         Final Clinical Impression(s) / ED Diagnoses Final diagnoses:  Acute on chronic congestive heart failure, unspecified heart failure type [facility])    Rx / DC Orders ED Discharge Orders     None         Terald Sleeper, MD 12/13/21 [phone]

## 2021-12-12 NOTE — Discharge Instructions (Addendum)
You are being treated today for fluid in your heart and lungs, related to your heart failure.  This may be due to your diet.  It is extremely important that you cut out salt from your diet.  Salt can raise your blood pressure and also cause more fluid to stay in your body.  For the next 7 days, I recommend that you increase your torsemide dose from 40 mg in the morning to 60 mg in the morning.  After that you can go back to your regular dose.  It is extremely important that you call your heart doctor's office to schedule follow-up appointment for this visit to the ER today.  If your breathing gets worse, if you begin having chest pressure, lightheadedness, or feel like passing out, please call 911 and come back to the ER

## 2021-12-30 ENCOUNTER — Other Ambulatory Visit (HOSPITAL_COMMUNITY): Payer: Self-pay | Admitting: Neonatal-Perinatal Medicine

## 2021-12-30 DIAGNOSIS — I5032 Chronic diastolic (congestive) heart failure: Secondary | ICD-10-CM

## 2022-01-07 ENCOUNTER — Encounter (HOSPITAL_BASED_OUTPATIENT_CLINIC_OR_DEPARTMENT_OTHER): Payer: Self-pay

## 2022-01-07 ENCOUNTER — Other Ambulatory Visit (HOSPITAL_BASED_OUTPATIENT_CLINIC_OR_DEPARTMENT_OTHER): Payer: Medicare Other

## 2022-02-08 ENCOUNTER — Other Ambulatory Visit: Payer: Self-pay | Admitting: Internal Medicine

## 2022-03-02 ENCOUNTER — Ambulatory Visit (INDEPENDENT_AMBULATORY_CARE_PROVIDER_SITE_OTHER): Payer: Medicare Other | Admitting: Internal Medicine

## 2022-03-02 ENCOUNTER — Encounter: Payer: Self-pay | Admitting: Internal Medicine

## 2022-03-02 VITALS — BP 142/80 | HR 67 | Ht 66.0 in | Wt 246.0 lb

## 2022-03-02 DIAGNOSIS — E1142 Type 2 diabetes mellitus with diabetic polyneuropathy: Secondary | ICD-10-CM

## 2022-03-02 DIAGNOSIS — E1165 Type 2 diabetes mellitus with hyperglycemia: Secondary | ICD-10-CM | POA: Diagnosis not present

## 2022-03-02 DIAGNOSIS — Z794 Long term (current) use of insulin: Secondary | ICD-10-CM | POA: Diagnosis not present

## 2022-03-02 DIAGNOSIS — E1129 Type 2 diabetes mellitus with other diabetic kidney complication: Secondary | ICD-10-CM | POA: Diagnosis not present

## 2022-03-02 DIAGNOSIS — R809 Proteinuria, unspecified: Secondary | ICD-10-CM

## 2022-03-02 LAB — POCT GLYCOSYLATED HEMOGLOBIN (HGB A1C): Hemoglobin A1C: 10.3 % — AB (ref 4.0–5.6)

## 2022-03-02 LAB — POCT GLUCOSE (DEVICE FOR HOME USE): POC Glucose: 252 mg/dl — AB (ref 70–99)

## 2022-03-02 MED ORDER — METFORMIN HCL 500 MG PO TABS
500.0000 mg | ORAL_TABLET | Freq: Every day | ORAL | 3 refills | Status: DC
Start: 2022-03-02 — End: 2022-08-29

## 2022-03-02 MED ORDER — DEXCOM G7 SENSOR MISC: 1.0000 | 3 refills | Status: AC

## 2022-03-02 MED ORDER — TRESIBA FLEXTOUCH 200 UNIT/ML ~~LOC~~ SOPN: 80.0000 [IU] | PEN_INJECTOR | Freq: Every day | SUBCUTANEOUS | 3 refills | Status: DC

## 2022-03-02 MED ORDER — DAPAGLIFLOZIN PROPANEDIOL 10 MG PO TABS: 10.0000 mg | ORAL_TABLET | Freq: Every day | ORAL | 3 refills | Status: DC

## 2022-03-02 NOTE — Progress Notes (Signed)
Name: Whitney Sloan  Age/ Sex: 66 y.o., female   MRN/ DOB: [id number], 09-Mar-1956     PCP: Pcp, No   Reason for Endocrinology Evaluation: Type 2 Diabetes Mellitus  Initial Endocrine Consultative Visit: 09/10/2018    PATIENT IDENTIFIER: Whitney Sloan is a 66 y.o. female with a past medical history of HTN, T2DM and Dyslipidemia. The patient has followed with Endocrinology clinic since 09/10/2018 for consultative assistance with management of her diabetes.  DIABETIC HISTORY:  Whitney Sloan was diagnosed with T2DM in 2008, she has been on Metformin, Glipizide, Januvia and Actos in the past.She has been on insulin therapy for years. Her hemoglobin A1c has ranged from 8.0% in 2011, peaking at 12.5% in 2010.  On her initial visit to our clinic her A1c was 11.0% , she was on lantus, Invokana, Metfomin and Pioglitazone but she was not taking them. Bydureon was added.   Invokana was stopped by the patient by 06/2019 due to recurrent yeast infections Prandial insulin added 07/2019  Rubelsus causes vomiting 07/2020  SUBJECTIVE:   During the last visit (03/03/2021): A1c 13.0% .  Adjusted MDi regimen and continued metformin and started Rybelsus     Today (03/02/2022): Whitney Sloan is here for a follow up on diabetes..  She has been checking glucose twice a day . The patient has not had hypoglycemic episodes since the last clinic visit.   She established care with [facility]   Metformin was reduced to loose stools which has helped   Denies recent nausea, vomiting or diarrhea   She continues with juice intake   She had ran out of Novolog  She does not take [place]:  Lantus 65 units daily Novolog 20 units with each meal Metformin 500 mg daily  Farxiga 10 mg daily - not taking      METER DOWNLOAD SUMMARY: did not bring      HISTORY:  Past Medical History:  Past Medical History:  Diagnosis Date   Abnormal stress test 12/31/2019   medium defect of  moderate severity present in the basal inferior, mid inferior and apex location.  Findings consistent with ischemia   Chronic diastolic (congestive) heart failure (HCC)    Diabetes mellitus    Hyperlipemia    Hypertension    Psoriasis    Past Surgical History:  Past Surgical History:  Procedure Laterality Date   ANAL FISSURE REPAIR     ENDOMETRIAL ABLATION     Social History:  reports that she has never smoked. She has been exposed to tobacco smoke. She has never used smokeless tobacco. She reports that she does not drink alcohol and does not use drugs. Family History:  Family History  Problem Relation Age of Onset   Dementia Mother    Renal Disease Father    Diabetes Other      HOME MEDICATIONS: Allergies as of 03/02/2022       Reactions   Oxycodone-acetaminophen Nausea And Vomiting        Medication List        Accurate as of March 02, 2022  1:07 PM. If you have any questions, ask your nurse or doctor.          STOP taking these medications    Dilt-XR 240 MG 24 hr capsule Generic drug: diltiazem Stopped by: Dorita Sciara, MD   furosemide 80 MG tablet Commonly known as: LASIX Stopped by: Dorita Sciara, MD  TAKE these medications    acetaminophen 500 MG tablet Commonly known as: TYLENOL Take 500 mg by mouth every 6 (six) hours as needed for moderate pain or headache.   ALKA-SELTZER PLUS COLD PO Take 2 tablets by mouth every 6 (six) hours as needed (cold symptoms).   Aspir-Low 81 MG tablet Generic drug: aspirin EC Take 81 mg by mouth every morning.   atorvastatin 20 MG tablet Commonly known as: LIPITOR Take 1 tablet (20 mg total) by mouth every evening.   carvedilol 25 MG tablet Commonly known as: Coreg Take 1 tablet (25 mg total) by mouth 2 (two) times daily.   clotrimazole 1 % vaginal cream Commonly known as: GYNE-LOTRIMIN Place 1 Applicatorful vaginally daily as needed (yeast).   Diclofenac Sodium 3 % Gel Apply 2  g topically 2 (two) times daily as needed. What changed: reasons to take this   ELDERBERRY PO Take 1 tablet by mouth daily as needed (immune support). Gummy   eplerenone 25 MG tablet Commonly known as: INSPRA Take 25 mg by mouth daily.   Ex-Lax 15 MG Tabs Generic drug: Sennosides Take 15 mg by mouth daily as needed (constipation).   ezetimibe 10 MG tablet Commonly known as: ZETIA Take 10 mg by mouth at bedtime.   Farxiga 10 MG Tabs tablet Generic drug: dapagliflozin propanediol Take 10 mg by mouth daily.   Insulin Pen Needle 32G X 4 MM Misc 1 Device by Does not apply route as directed.   B-D UF III MINI PEN NEEDLES 31G X 5 MM Misc Generic drug: Insulin Pen Needle SMARTSIG:injection 3 Times Daily   Lantus SoloStar 100 UNIT/ML Solostar Pen Generic drug: insulin glargine Inject 60 Units into the skin daily. What changed: how much to take   losartan 100 MG tablet Commonly known as: COZAAR Take 1 tablet (100 mg total) by mouth daily.   magnesium oxide 400 MG tablet Commonly known as: MAG-OX Take 1 tablet (400 mg total) by mouth daily.   metFORMIN 500 MG tablet Commonly known as: GLUCOPHAGE Take 1 tablet (500 mg total) by mouth 2 (two) times daily with a meal. What changed: when to take this   NovoLOG FlexPen 100 UNIT/ML FlexPen Generic drug: insulin aspart ADMINISTER 18 UNITS UNDER THE SKIN THREE TIMES DAILY WITH MEALS What changed: See the new instructions.   polyethylene glycol 17 g packet Commonly known as: MIRALAX / GLYCOLAX Take 17 g by mouth daily as needed for mild constipation.   potassium chloride SA 20 MEQ tablet Commonly known as: KLOR-CON M Take 1 tablet (20 mEq total) by mouth daily.   torsemide 20 MG tablet Commonly known as: DEMADEX Take 60 mg by mouth daily.         OBJECTIVE:   Vital Signs: BP (!) 142/80 (BP Location: Left Arm, Patient Position: Sitting, Cuff Size: Large)   Pulse 67   Ht 5\' 6"  (1.676 m)   Wt 246 lb (111.6 kg)    SpO2 95%   BMI 39.71 kg/m   Wt Readings from Last 3 Encounters:  03/02/22 246 lb (111.6 kg)  12/12/21 242 lb (109.8 kg)  09/02/21 235 lb 0.2 oz (106.6 kg)     Exam: General: Pt appears well and is in NAD  Lungs: CTA   Heart: RRR  Extremities: Trace  pretibial edema.  Neuro: MS is good with appropriate affect, pt is alert and Ox3    DM Foot Exam 03/02/2022 The skin of the feet is without sores or ulcerations, toe deformity  noted  The pedal pulses 2+ B/L The sensation is decreased to a screening 5.07, 10 gram monofilament bilaterally       DATA REVIEWED:  Lab Results  Component Value Date   HGBA1C 10.3 (A) 03/02/2022   HGBA1C >15.0 08/27/2021   HGBA1C >15.0 03/10/2021    Latest Reference Range & Units 12/12/21 19:29  Sodium 135 - 145 mmol/L 140  Potassium 3.5 - 5.1 mmol/L 4.3  Chloride 98 - 111 mmol/L 103  CO2 22 - 32 mmol/L 26  Glucose 70 - 99 mg/dL 255 (H)  BUN 8 - 23 mg/dL 25 (H)  Creatinine 0.44 - 1.00 mg/dL 0.94  Calcium 8.9 - 10.3 mg/dL 9.9  Anion gap 5 - 15  11  GFR, Estimated >60 mL/min >60   ASSESSMENT / PLAN / RECOMMENDATIONS:   1) Type 2 Diabetes Mellitus,Poorly controlled, With neuropathic complications and microalbuminuria- Most recent A1c of 10.0 %. Goal A1c < 7.0 %.     -A1c has trended down from 15 % to 10.0%, unfortunately she continues to drink quite a bit of juice which I have advised her to DISCONTINUE, she may drink zero sugar drinks.  -Intolerant to Rybelsus and Anastasio Auerbach, will try Wilder Glade, its on her medication list ( not sure if this was prescribed by PCP or not) but she does not believe she is on it. Will refill and monitor for genital infections  -I have prescribed the Dexcom in the past without success, she was given a sample today and a new prescription of Dexcom G7 was sent  -Patient with multiple social determinants - She tells me she has issues with obtaining lantus, will switch to [place] and increase the dose  - I am not going to  change novolog at this time, due to lack of glucose data, her last dose was last night with supper, fasting BG this morning per pt 220 mg/dL  - Intolerant to higher doses of metformin   MEDICATIONS: -Switch Lantus to [place] and increase to 80 units daily   -Continue metformin 500 mg  daily -Continue NovoLog  20 units with each meal - Take Farxiga 10 mg daily    EDUCATION / INSTRUCTIONS: BG monitoring instructions: Patient is instructed to check her blood sugars 3 times a day, before meals Call [facility] if: BG persistently < 70  I reviewed the Rule of 15 for the treatment of hypoglycemia in detail with the patient. Literature supplied.     F/U in 6 months     Signed electronically by: Mack Guise, MD  [facility]  [facility] [address] [place], [place] [address] Phone: [phone] FAX: [phone]   CC: Pcp, No No address on file Phone: None  Fax: None  Return to Endocrinology clinic as below: No future appointments.

## 2022-03-02 NOTE — Patient Instructions (Addendum)
-   Switch  Lantus  to [place] and take 80 units once daily ( same time every day) - Continue Metformin 500 mg , 1 tablet daily  - Continue  Novolog 20 units with each meal  - Start  Farxiga 10 mg daily    STOP JUICE ( you can have sugar free /zero sugar drinks ONLY)    HOW TO TREAT LOW BLOOD SUGARS (Blood sugar LESS THAN 70 MG/DL) Please follow the RULE OF 15 for the treatment of hypoglycemia treatment (when your (blood sugars are less than 70 mg/dL)   STEP 1: Take 15 grams of carbohydrates when your blood sugar is low, which includes:  3-4 GLUCOSE TABS  OR 3-4 OZ OF JUICE OR REGULAR SODA OR ONE TUBE OF GLUCOSE GEL    STEP 2: RECHECK blood sugar in 15 MINUTES STEP 3: If your blood sugar is still low at the 15 minute recheck --> then, go back to STEP 1 and treat AGAIN with another 15 grams of carbohydrates.

## 2022-03-24 ENCOUNTER — Other Ambulatory Visit: Payer: Self-pay

## 2022-03-24 ENCOUNTER — Ambulatory Visit (HOSPITAL_COMMUNITY)
Admission: EM | Admit: 2022-03-24 | Discharge: 2022-03-24 | Disposition: A | Discharge status: Home / Self Care | Payer: Medicare Other | Attending: Emergency Medicine | Admitting: Emergency Medicine

## 2022-03-24 ENCOUNTER — Encounter (HOSPITAL_COMMUNITY): Payer: Self-pay | Admitting: Emergency Medicine

## 2022-03-24 DIAGNOSIS — E1165 Type 2 diabetes mellitus with hyperglycemia: Secondary | ICD-10-CM

## 2022-03-24 DIAGNOSIS — B379 Candidiasis, unspecified: Secondary | ICD-10-CM

## 2022-03-24 MED ORDER — FLUCONAZOLE 150 MG PO TABS: 150.0000 mg | ORAL_TABLET | Freq: Once | ORAL | 0 refills | Status: DC | PRN

## 2022-03-24 NOTE — ED Notes (Signed)
Cervicovaginal swab has liquid, lid secure, labeled at bedside and placed in lab

## 2022-03-24 NOTE — ED Provider Notes (Signed)
[place]    CSN: [id number] Arrival date & time: 03/24/22  1420     History   Chief Complaint Chief Complaint  Patient presents with   Vaginal Itching    HPI Whitney Sloan is a 66 y.o. female.  Presents with 1 month history of vaginal itching, white discharge Reports similar symptoms 2 months ago, at that time had test done for yeast which was positive.  She feels symptoms are the same  Denies any exposures to STDs.  No vaginal bleeding.  Denies abdominal pain.  History of diabetes.  She checks her sugars at home.  Last checked two days ago before eating it was in the 200s.  She has just seen diabetes educator last month  Overall feeling well, denies fever, nausea, vomiting, abdominal pain. Normal BMs, no urinary symptoms.  Has been taking her medications as prescribed.  Past Medical History:  Diagnosis Date   Abnormal stress test 12/31/2019   medium defect of moderate severity present in the basal inferior, mid inferior and apex location.  Findings consistent with ischemia   Chronic diastolic (congestive) heart failure (HCC)    Diabetes mellitus    Hyperlipemia    Hypertension    Psoriasis     Patient Active Problem List   Diagnosis Date Noted   Pressure injury of skin 09/02/2021   Acute encephalopathy 08/31/2021   UTI (urinary tract infection) 08/31/2021   AKI (acute kidney injury) ([place]) 08/30/2021   Elevated troponin 08/30/2021   (HFpEF) heart failure with preserved ejection fraction ([place]) 08/30/2021   Generalized weakness 08/30/2021   COVID-19 virus infection 12/13/2020   Type 2 diabetes mellitus with microalbuminuria, with long-term current use of insulin ([place]) 10/15/2020   Right knee pain 04/10/2020   Chest pain of uncertain etiology 99991111   CAP (community acquired pneumonia) 12/19/2019   Constipation 12/10/2019   Cholelithiasis 12/10/2019   Thyroid nodule 12/10/2019   Obesity (BMI 30-39.9) 12/10/2019   Acute on chronic combined  systolic and diastolic CHF (congestive heart failure) ([place]) 11/08/2019   Acute respiratory failure with hypoxia ([place]) 11/08/2019   Hypertensive urgency 11/08/2019   Mixed diabetic hyperlipidemia associated with type 2 diabetes mellitus ([place]) 11/08/2019   Type 2 diabetes mellitus with diabetic polyneuropathy, with long-term current use of insulin ([place]) 08/08/2019   Dyslipidemia 03/23/2010   DECREASED HEARING, BILATERAL 02/05/2010   MEMORY LOSS 02/05/2010   INCONTINENCE 02/05/2010   NEOPLASM, MALIGNANT, BREAST, HX OF 01/13/2010   ANXIETY STATE, UNSPECIFIED 12/02/2009   UNSTEADY GAIT 12/02/2009   TOE PAIN 09/24/2009   ANAL OR RECTAL PAIN 08/25/2009   LEG EDEMA, BILATERAL 08/25/2009   Uncontrolled type 2 diabetes mellitus with hyperglycemia, with long-term current use of insulin ([place]) 08/06/2009   Hypertension associated with diabetes ([place]) 08/06/2009   DERMATITIS, ATOPIC 08/06/2009    Past Surgical History:  Procedure Laterality Date   ANAL FISSURE REPAIR     ENDOMETRIAL ABLATION      OB History     Gravida  2   Para      Term      Preterm      AB      Living  2      SAB      IAB      Ectopic      Multiple      Live Births  2            Home Medications    Prior to Admission medications   Medication  Sig Start Date End Date Taking? Authorizing Provider  fluconazole (DIFLUCAN) 150 MG tablet Take 1 tablet (150 mg total) by mouth once as needed for up to 2 doses (take one pill on day 1, and the second pill 3 days later if symptoms do not improve). 03/24/22  Yes Axel Meas, Wells Guiles, PA-C  acetaminophen (TYLENOL) 500 MG tablet Take 500 mg by mouth every 6 (six) hours as needed for moderate pain or headache.    [provider]  aspirin (ASPIR-LOW) 81 MG EC tablet Take 81 mg by mouth every morning.    [provider]  atorvastatin (LIPITOR) 20 MG tablet Take 1 tablet (20 mg total) by mouth every evening. 08/30/21   Shamleffer, Melanie Crazier, MD   B-D UF III MINI PEN NEEDLES 31G X 5 MM MISC SMARTSIG:injection 3 Times Daily 01/18/22   [provider]  carvedilol (COREG) 25 MG tablet Take 1 tablet (25 mg total) by mouth 2 (two) times daily. 12/18/20 03/02/22  Florencia Reasons, MD  Chlorphen-Phenyleph-ASA (ALKA-SELTZER PLUS COLD PO) Take 2 tablets by mouth every 6 (six) hours as needed (cold symptoms).    [provider]  Continuous Blood Gluc Sensor (DEXCOM G7 SENSOR) MISC 1 Device by Does not apply route as directed. 03/02/22   Shamleffer, Melanie Crazier, MD  dapagliflozin propanediol (FARXIGA) 10 MG TABS tablet Take 1 tablet (10 mg total) by mouth daily before breakfast. 03/02/22   Shamleffer, Melanie Crazier, MD  Diclofenac Sodium 3 % GEL Apply 2 g topically 2 (two) times daily as needed. Patient taking differently: Apply 2 g topically 2 (two) times daily as needed (pain). 04/10/20   Dickie La, MD  ELDERBERRY PO Take 1 tablet by mouth daily as needed (immune support). Gummy    [provider]  eplerenone (INSPRA) 25 MG tablet Take 25 mg by mouth daily. 12/23/21   [provider]  ezetimibe (ZETIA) 10 MG tablet Take 10 mg by mouth at bedtime. 08/21/19   [provider]  insulin degludec (TRESIBA FLEXTOUCH) 200 UNIT/ML FlexTouch Pen Inject 80 Units into the skin daily in the afternoon. 03/02/22   Shamleffer, Melanie Crazier, MD  Insulin Pen Needle 32G X 4 MM MISC 1 Device by Does not apply route as directed. 03/26/20   Shamleffer, Melanie Crazier, MD  losartan (COZAAR) 100 MG tablet Take 1 tablet (100 mg total) by mouth daily. 08/30/21   Shamleffer, Melanie Crazier, MD  magnesium oxide (MAG-OX) 400 MG tablet Take 1 tablet (400 mg total) by mouth daily. 01/03/20   Donato Heinz, MD  metFORMIN (GLUCOPHAGE) 500 MG tablet Take 1 tablet (500 mg total) by mouth daily with breakfast. 03/02/22   Shamleffer, Melanie Crazier, MD  NOVOLOG FLEXPEN 100 UNIT/ML FlexPen ADMINISTER 18 UNITS UNDER THE SKIN THREE TIMES  DAILY WITH MEALS Patient taking differently: 20 Units 3 (three) times daily with meals. 02/08/22   Shamleffer, Melanie Crazier, MD  polyethylene glycol (MIRALAX / GLYCOLAX) 17 g packet Take 17 g by mouth daily as needed for mild constipation.    [provider]  potassium chloride SA (KLOR-CON) 20 MEQ tablet Take 1 tablet (20 mEq total) by mouth daily. Patient not taking: Reported on 08/30/2021 12/18/20 03/18/21  Florencia Reasons, MD  Sennosides (EX-LAX) 15 MG TABS Take 15 mg by mouth daily as needed (constipation).    [provider]  torsemide (DEMADEX) 20 MG tablet Take 60 mg by mouth daily. 12/24/21   [provider]    Family History Family History  Problem Relation Age of Onset   Dementia Mother    Renal Disease Father    Diabetes Other     Social History Social History   Tobacco Use   Smoking status: Never    Passive exposure: Past   Smokeless tobacco: Never  Vaping Use   Vaping Use: Never used  Substance Use Topics   Alcohol use: No   Drug use: No     Allergies   Oxycodone-acetaminophen   Review of Systems Review of Systems Per HPI  Physical Exam Triage Vital Signs ED Triage Vitals  Enc Vitals Group     BP 03/24/22 1640 116/70     Pulse Rate 03/24/22 1640 66     Resp 03/24/22 1640 18     Temp 03/24/22 1640 98.2 F (36.8 C)     Temp Source 03/24/22 1640 Oral     SpO2 03/24/22 1640 97 %     Weight --      Height --      Head Circumference --      Peak Flow --      Pain Score 03/24/22 1638 6     Pain Loc --      Pain Edu? --      Excl. in [place]? --    No data found.  Updated Vital Signs BP 116/70 (BP Location: Right Arm) Comment: large cuff Comment (BP Location): large cuff  Pulse 66   Temp 98.2 F (36.8 C) (Oral)   Resp 18   SpO2 97%    Physical Exam Vitals and nursing note reviewed.  Constitutional:      General: She is not in acute distress.    Appearance: Normal appearance.     Comments: Very pleasant and polite  HENT:      Mouth/Throat:     Mouth: Mucous membranes are moist.     Pharynx: Oropharynx is clear.  Cardiovascular:     Rate and Rhythm: Normal rate and regular rhythm.     Pulses: Normal pulses.  Pulmonary:     Effort: Pulmonary effort is normal.  Abdominal:     Palpations: Abdomen is soft.     Tenderness: There is no abdominal tenderness. There is no guarding.  Neurological:     Mental Status: She is alert and oriented to person, place, and time.     UC Treatments / Results  Labs (all labs ordered are listed, but only abnormal results are displayed) Labs Reviewed  CERVICOVAGINAL ANCILLARY ONLY    EKG   Radiology No results found.  Procedures Procedures (including critical care time)  Medications Ordered in UC Medications - No data to display  Initial Impression / Assessment and Plan / UC Course  I have reviewed the triage vital signs and the nursing notes.  Pertinent labs & imaging results that were available during my care of the patient were reviewed by me and considered in my medical decision making (see chart for details).  1) Uncontrolled T2 DM We discussed diabetes etiology and how uncontrolled DM can lead to recurrent yeast infections. Patient will follow with DM educator and PCP about management, will continue her medications for DM: - farxiga 10 mg daily - tresiba 80 units daily in afternoon - novolog 20 units TID w/ meals - metformin 500 mg daily  2) Yeast infection Declines swab today as she is certain this is yeast, reports prone to yeast infections. - Fluconazole, two dose, 150 mg each prescribed. She will return if this does not  resolve her symptoms, also recommend follow with ob/gyn if they persist. Return precautions discussed. Patient agrees to plan  Final Clinical Impressions(s) / UC Diagnoses   Final diagnoses:  Yeast infection  Uncontrolled type 2 diabetes mellitus with hyperglycemia ([place])     Discharge Instructions      Please take the  fluconazole (anti yeast pill) as prescribed.  Return if symptoms persist despite treatment, or follow with an ob/gyn.  Please follow up with your primary care provider regarding your diabetes management.    ED Prescriptions     Medication Sig Dispense Auth. Provider   fluconazole (DIFLUCAN) 150 MG tablet Take 1 tablet (150 mg total) by mouth once as needed for up to 2 doses (take one pill on day 1, and the second pill 3 days later if symptoms do not improve). 2 tablet Maclin Guerrette, Wells Guiles, PA-C      PDMP not reviewed this encounter.   Shandricka Monroy, Vernice Jefferson 03/24/22 1825

## 2022-03-24 NOTE — ED Triage Notes (Signed)
Patient reports vaginal itching and and soreness and vaginal discharge.  Onset one month ago

## 2022-03-24 NOTE — Discharge Instructions (Addendum)
Please take the fluconazole (anti yeast pill) as prescribed.  Return if symptoms persist despite treatment, or follow with an ob/gyn.  Please follow up with your primary care provider regarding your diabetes management.

## 2022-07-30 ENCOUNTER — Encounter (HOSPITAL_COMMUNITY): Payer: Self-pay | Admitting: *Deleted

## 2022-07-30 ENCOUNTER — Other Ambulatory Visit: Payer: Self-pay

## 2022-07-30 ENCOUNTER — Ambulatory Visit (HOSPITAL_COMMUNITY)
Admission: EM | Admit: 2022-07-30 | Discharge: 2022-07-30 | Disposition: A | Discharge status: Home / Self Care | Payer: 59 | Attending: Internal Medicine | Admitting: Internal Medicine

## 2022-07-30 DIAGNOSIS — B379 Candidiasis, unspecified: Secondary | ICD-10-CM

## 2022-07-30 DIAGNOSIS — R32 Unspecified urinary incontinence: Secondary | ICD-10-CM | POA: Diagnosis not present

## 2022-07-30 LAB — POCT URINALYSIS DIPSTICK, ED / UC
Bilirubin Urine: NEGATIVE
Glucose, UA: 500 mg/dL — AB
Ketones, ur: NEGATIVE mg/dL
Leukocytes,Ua: NEGATIVE
Nitrite: NEGATIVE
Protein, ur: >=300 mg/dL — AB
Specific Gravity, Urine: 1.015 (ref 1.005–1.030)
Urobilinogen, UA: 0.2 mg/dL (ref 0.0–1.0)
pH: 5 (ref 5.0–8.0)

## 2022-07-30 MED ORDER — FLUCONAZOLE 150 MG PO TABS: 150.0000 mg | ORAL_TABLET | Freq: Once | ORAL | 2 refills | Status: DC | PRN

## 2022-07-30 NOTE — ED Provider Notes (Signed)
[place]    CSN: [id number] Arrival date & time: 07/30/22  1006      History   Chief Complaint Chief Complaint  Patient presents with   Vaginal Itching   Urinary Incontinence    HPI Whitney Sloan is a 67 y.o. female with history of diabetes presents to urgent care today with complaints of vaginal discharge and itching.  Patient also having questions regarding chronic urinary incontinence.  35-monthhistory of vaginal itching.  Denies any odor or unusual vaginal bleeding, no pelvic pain.  Patient is not sexually active and has not been for some time.  Patient also reports longstanding history of urinary incontinence and concerned it is becoming worse.  She denies any pain with urination or gross hematuria, no recent fever or chills.   Past Medical History:  Diagnosis Date   Abnormal stress test 12/31/2019   medium defect of moderate severity present in the basal inferior, mid inferior and apex location.  Findings consistent with ischemia   Chronic diastolic (congestive) heart failure (HCC)    Diabetes mellitus    Hyperlipemia    Hypertension    Psoriasis     Patient Active Problem List   Diagnosis Date Noted   Pressure injury of skin 09/02/2021   Acute encephalopathy 08/31/2021   UTI (urinary tract infection) 08/31/2021   AKI (acute kidney injury) ([place] 08/30/2021   Elevated troponin 08/30/2021   (HFpEF) heart failure with preserved ejection fraction ([facility] 08/30/2021   Generalized weakness 08/30/2021   COVID-19 virus infection 12/13/2020   Type 2 diabetes mellitus with microalbuminuria, with long-term current use of insulin ([facility] 10/15/2020   Right knee pain 04/10/2020   Chest pain of uncertain etiology 099991111  CAP (community acquired pneumonia) 12/19/2019   Constipation 12/10/2019   Cholelithiasis 12/10/2019   Thyroid nodule 12/10/2019   Obesity (BMI 30-39.9) 12/10/2019   Acute on chronic combined systolic and diastolic CHF (congestive heart  failure) ([place] 11/08/2019   Acute respiratory failure with hypoxia ([facility] 11/08/2019   Hypertensive urgency 11/08/2019   Mixed diabetic hyperlipidemia associated with type 2 diabetes mellitus ([facility] 11/08/2019   Type 2 diabetes mellitus with diabetic polyneuropathy, with long-term current use of insulin ([facility] 08/08/2019   Dyslipidemia 03/23/2010   DECREASED HEARING, BILATERAL 02/05/2010   MEMORY LOSS 02/05/2010   INCONTINENCE 02/05/2010   NEOPLASM, MALIGNANT, BREAST, HX OF 01/13/2010   ANXIETY STATE, UNSPECIFIED 12/02/2009   UNSTEADY GAIT 12/02/2009   TOE PAIN 09/24/2009   ANAL OR RECTAL PAIN 08/25/2009   LEG EDEMA, BILATERAL 08/25/2009   Uncontrolled type 2 diabetes mellitus with hyperglycemia, with long-term current use of insulin ([facility] 08/06/2009   Hypertension associated with diabetes ([facility] 08/06/2009   DERMATITIS, ATOPIC 08/06/2009    Past Surgical History:  Procedure Laterality Date   ANAL FISSURE REPAIR     ENDOMETRIAL ABLATION      OB History     Gravida  2   Para      Term      Preterm      AB      Living  2      SAB      IAB      Ectopic      Multiple      Live Births  2            Home Medications    Prior to Admission medications   Medication Sig Start Date End Date Taking? Authorizing Provider  acetaminophen (TYLENOL) 500 MG tablet  Take 500 mg by mouth every 6 (six) hours as needed for moderate pain or headache.    [provider]  aspirin (ASPIR-LOW) 81 MG EC tablet Take 81 mg by mouth every morning.    [provider]  atorvastatin (LIPITOR) 20 MG tablet Take 1 tablet (20 mg total) by mouth every evening. 08/30/21   Shamleffer, Melanie Crazier, MD  B-D UF III MINI PEN NEEDLES 31G X 5 MM MISC SMARTSIG:injection 3 Times Daily 01/18/22   [provider]  carvedilol (COREG) 25 MG tablet Take 1 tablet (25 mg total) by mouth 2 (two) times daily. 12/18/20 03/02/22  Florencia Reasons, MD  Chlorphen-Phenyleph-ASA (ALKA-SELTZER PLUS  COLD PO) Take 2 tablets by mouth every 6 (six) hours as needed (cold symptoms).    [provider]  Continuous Blood Gluc Sensor (DEXCOM G7 SENSOR) MISC 1 Device by Does not apply route as directed. 03/02/22   Shamleffer, Melanie Crazier, MD  dapagliflozin propanediol (FARXIGA) 10 MG TABS tablet Take 1 tablet (10 mg total) by mouth daily before breakfast. 03/02/22   Shamleffer, Melanie Crazier, MD  Diclofenac Sodium 3 % GEL Apply 2 g topically 2 (two) times daily as needed. Patient taking differently: Apply 2 g topically 2 (two) times daily as needed (pain). 04/10/20   Dickie La, MD  ELDERBERRY PO Take 1 tablet by mouth daily as needed (immune support). Gummy    [provider]  eplerenone (INSPRA) 25 MG tablet Take 25 mg by mouth daily. 12/23/21   [provider]  ezetimibe (ZETIA) 10 MG tablet Take 10 mg by mouth at bedtime. 08/21/19   [provider]  fluconazole (DIFLUCAN) 150 MG tablet Take 1 tablet (150 mg total) by mouth once as needed for up to 2 doses (take one pill on day 1, and the second pill 3 days later if symptoms do not improve). 07/30/22   Rudolpho Sevin, NP  insulin degludec (TRESIBA FLEXTOUCH) 200 UNIT/ML FlexTouch Pen Inject 80 Units into the skin daily in the afternoon. 03/02/22   Shamleffer, Melanie Crazier, MD  Insulin Pen Needle 32G X 4 MM MISC 1 Device by Does not apply route as directed. 03/26/20   Shamleffer, Melanie Crazier, MD  losartan (COZAAR) 100 MG tablet Take 1 tablet (100 mg total) by mouth daily. 08/30/21   Shamleffer, Melanie Crazier, MD  magnesium oxide (MAG-OX) 400 MG tablet Take 1 tablet (400 mg total) by mouth daily. 01/03/20   Donato Heinz, MD  metFORMIN (GLUCOPHAGE) 500 MG tablet Take 1 tablet (500 mg total) by mouth daily with breakfast. 03/02/22   Shamleffer, Melanie Crazier, MD  NOVOLOG FLEXPEN 100 UNIT/ML FlexPen ADMINISTER 18 UNITS UNDER THE SKIN THREE TIMES DAILY WITH MEALS Patient taking differently: 20 Units 3  (three) times daily with meals. 02/08/22   Shamleffer, Melanie Crazier, MD  polyethylene glycol (MIRALAX / GLYCOLAX) 17 g packet Take 17 g by mouth daily as needed for mild constipation.    [provider]  potassium chloride SA (KLOR-CON) 20 MEQ tablet Take 1 tablet (20 mEq total) by mouth daily. Patient not taking: Reported on 08/30/2021 12/18/20 03/18/21  Florencia Reasons, MD  Sennosides (EX-LAX) 15 MG TABS Take 15 mg by mouth daily as needed (constipation).    [provider]  torsemide (DEMADEX) 20 MG tablet Take 60 mg by mouth daily. 12/24/21   [provider]    Family History Family History  Problem Relation Age of Onset   Dementia Mother    Renal  Disease Father    Diabetes Other     Social History Social History   Tobacco Use   Smoking status: Never    Passive exposure: Past   Smokeless tobacco: Never  Vaping Use   Vaping Use: Never used  Substance Use Topics   Alcohol use: No   Drug use: No     Allergies   Oxycodone-acetaminophen   Review of Systems As stated in HPI otherwise negative   Physical Exam Triage Vital Signs ED Triage Vitals [07/30/22 1048]  Enc Vitals Group     BP (!) 157/82     Pulse Rate 77     Resp 20     Temp 98.4 F (36.9 C)     Temp src      SpO2 92 %     Weight      Height      Head Circumference      Peak Flow      Pain Score      Pain Loc      Pain Edu?      Excl. in [place]?    No data found.  Updated Vital Signs BP (!) 157/82   Pulse 77   Temp 98.4 F (36.9 C)   Resp 20   SpO2 92%   Visual Acuity Right Eye Distance:   Left Eye Distance:   Bilateral Distance:    Right Eye Near:   Left Eye Near:    Bilateral Near:     Physical Exam Constitutional:      General: She is not in acute distress.    Appearance: Normal appearance. She is obese. She is not ill-appearing or toxic-appearing.  Abdominal:     General: Bowel sounds are normal.     Palpations: Abdomen is soft.     Tenderness: There is no  abdominal tenderness. There is no right CVA tenderness, left CVA tenderness or guarding.  Skin:    General: Skin is warm and dry.  Neurological:     Mental Status: She is alert.  Psychiatric:        Mood and Affect: Mood normal.        Behavior: Behavior normal.      UC Treatments / Results  Labs (all labs ordered are listed, but only abnormal results are displayed) Labs Reviewed  POCT URINALYSIS DIPSTICK, ED / UC - Abnormal; Notable for the following components:      Result Value   Glucose, UA 500 (*)    Hgb urine dipstick SMALL (*)    Protein, ur >=300 (*)    All other components within normal limits    EKG   Radiology No results found.  Procedures Procedures (including critical care time)  Medications Ordered in UC Medications - No data to display  Initial Impression / Assessment and Plan / UC Course  I have reviewed the triage vital signs and the nursing notes.  Pertinent labs & imaging results that were available during my care of the patient were reviewed by me and considered in my medical decision making (see chart for details).  Vaginitis Urinary incontinence -Patient symptoms consistent with vaginal yeast infection.  She has not been sexually active in some time therefore no need to obtain cervical swab to rule out STI.  She is higher risk for candidiasis given uncontrolled diabetes.  Will treat with Diflucan.  No evidence of urinary tract infection.  Suspect incontinence that is related to weakening bladder.  Will give information for [facility]  for further workup.  Reviewed expections re: course of current medical issues. Questions answered. Outlined signs and symptoms indicating need for more acute intervention. Pt verbalized understanding. AVS given  Final Clinical Impressions(s) / UC Diagnoses   Final diagnoses:  Yeast infection  Urinary incontinence, unspecified type     Discharge Instructions      I am treating you today for a yeast  infection.  Take medication once and then repeat in 3 days if needed.  I have given you a refill on this medication should you have any issues in the future.  I do not see any evidence of a urinary tract infection requiring antibiotics.  I have given you the number to [facility].  Contact their office to schedule an appointment to discuss your urinary incontinence.  Please follow-up at urgent care for any continued symptoms.     ED Prescriptions     Medication Sig Dispense Auth. Provider   fluconazole (DIFLUCAN) 150 MG tablet Take 1 tablet (150 mg total) by mouth once as needed for up to 2 doses (take one pill on day 1, and the second pill 3 days later if symptoms do not improve). 2 tablet Rudolpho Sevin, NP      PDMP not reviewed this encounter.   Rudolpho Sevin, NP 07/30/22 1243

## 2022-07-30 NOTE — ED Triage Notes (Signed)
Pt reports a 2 month Hx of vag itching and urinary incontinence. Pt has a PCP but has not contacted Provider for a check up.

## 2022-07-30 NOTE — Discharge Instructions (Addendum)
I am treating you today for a yeast infection.  Take medication once and then repeat in 3 days if needed.  I have given you a refill on this medication should you have any issues in the future.  I do not see any evidence of a urinary tract infection requiring antibiotics.  I have given you the number to [facility].  Contact their office to schedule an appointment to discuss your urinary incontinence.  Please follow-up at urgent care for any continued symptoms.

## 2022-08-05 ENCOUNTER — Emergency Department (HOSPITAL_COMMUNITY)
Admission: EM | Admit: 2022-08-05 | Discharge: 2022-08-06 | Disposition: A | Discharge status: Home / Self Care | Payer: 59 | Attending: Emergency Medicine | Admitting: Emergency Medicine

## 2022-08-05 ENCOUNTER — Emergency Department (HOSPITAL_COMMUNITY): Payer: 59

## 2022-08-05 DIAGNOSIS — Z7982 Long term (current) use of aspirin: Secondary | ICD-10-CM | POA: Diagnosis not present

## 2022-08-05 DIAGNOSIS — S0990XA Unspecified injury of head, initial encounter: Secondary | ICD-10-CM | POA: Diagnosis not present

## 2022-08-05 DIAGNOSIS — E1165 Type 2 diabetes mellitus with hyperglycemia: Secondary | ICD-10-CM | POA: Diagnosis not present

## 2022-08-05 DIAGNOSIS — M25552 Pain in left hip: Secondary | ICD-10-CM | POA: Insufficient documentation

## 2022-08-05 DIAGNOSIS — I10 Essential (primary) hypertension: Secondary | ICD-10-CM | POA: Insufficient documentation

## 2022-08-05 DIAGNOSIS — W19XXXA Unspecified fall, initial encounter: Secondary | ICD-10-CM

## 2022-08-05 DIAGNOSIS — Z87440 Personal history of urinary (tract) infections: Secondary | ICD-10-CM | POA: Diagnosis not present

## 2022-08-05 DIAGNOSIS — Z8673 Personal history of transient ischemic attack (TIA), and cerebral infarction without residual deficits: Secondary | ICD-10-CM | POA: Insufficient documentation

## 2022-08-05 DIAGNOSIS — Z794 Long term (current) use of insulin: Secondary | ICD-10-CM | POA: Insufficient documentation

## 2022-08-05 DIAGNOSIS — E785 Hyperlipidemia, unspecified: Secondary | ICD-10-CM | POA: Diagnosis not present

## 2022-08-05 DIAGNOSIS — Z79899 Other long term (current) drug therapy: Secondary | ICD-10-CM | POA: Diagnosis not present

## 2022-08-05 DIAGNOSIS — Y92002 Bathroom of unspecified non-institutional (private) residence single-family (private) house as the place of occurrence of the external cause: Secondary | ICD-10-CM | POA: Insufficient documentation

## 2022-08-05 DIAGNOSIS — W1830XA Fall on same level, unspecified, initial encounter: Secondary | ICD-10-CM | POA: Insufficient documentation

## 2022-08-05 DIAGNOSIS — E876 Hypokalemia: Secondary | ICD-10-CM

## 2022-08-05 DIAGNOSIS — R350 Frequency of micturition: Secondary | ICD-10-CM | POA: Insufficient documentation

## 2022-08-05 DIAGNOSIS — D72829 Elevated white blood cell count, unspecified: Secondary | ICD-10-CM | POA: Insufficient documentation

## 2022-08-05 DIAGNOSIS — Z7984 Long term (current) use of oral hypoglycemic drugs: Secondary | ICD-10-CM | POA: Diagnosis not present

## 2022-08-05 LAB — CBC WITH DIFFERENTIAL/PLATELET
Abs Immature Granulocytes: 0.05 10*3/uL (ref 0.00–0.07)
Basophils Absolute: 0 10*3/uL (ref 0.0–0.1)
Basophils Relative: 0 %
Eosinophils Absolute: 0.1 10*3/uL (ref 0.0–0.5)
Eosinophils Relative: 1 %
HCT: 40.8 % (ref 36.0–46.0)
Hemoglobin: 13.7 g/dL (ref 12.0–15.0)
Immature Granulocytes: 0 %
Lymphocytes Relative: 23 %
Lymphs Abs: 3 10*3/uL (ref 0.7–4.0)
MCH: 28.2 pg (ref 26.0–34.0)
MCHC: 33.6 g/dL (ref 30.0–36.0)
MCV: 84 fL (ref 80.0–100.0)
Monocytes Absolute: 1.1 10*3/uL — ABNORMAL HIGH (ref 0.1–1.0)
Monocytes Relative: 8 %
Neutro Abs: 9 10*3/uL — ABNORMAL HIGH (ref 1.7–7.7)
Neutrophils Relative %: 68 %
Platelets: 235 10*3/uL (ref 150–400)
RBC: 4.86 MIL/uL (ref 3.87–5.11)
RDW: 13.9 % (ref 11.5–15.5)
WBC: 13.2 10*3/uL — ABNORMAL HIGH (ref 4.0–10.5)
nRBC: 0 % (ref 0.0–0.2)

## 2022-08-05 MED ORDER — LOSARTAN POTASSIUM 50 MG PO TABS
100.0000 mg | ORAL_TABLET | Freq: Once | ORAL | Status: AC
Start: 2022-08-05 — End: 2022-08-05
  Administered 2022-08-05: 100 mg via ORAL
  Filled 2022-08-05: qty 2

## 2022-08-05 MED ORDER — LACTATED RINGERS IV BOLUS
1000.0000 mL | Freq: Once | INTRAVENOUS | Status: AC
  Administered 2022-08-06: 1000 mL via INTRAVENOUS

## 2022-08-05 MED ORDER — ACETAMINOPHEN 500 MG PO TABS
1000.0000 mg | ORAL_TABLET | Freq: Once | ORAL | Status: AC
  Administered 2022-08-05: 1000 mg via ORAL
  Filled 2022-08-05: qty 2

## 2022-08-05 NOTE — ED Provider Notes (Signed)
[facility] Provider Note   CSN: [id number] Arrival date & time: 08/05/22  1903     History  Chief Complaint  Patient presents with   Lytle Michaels    Whitney Sloan is a 67 y.o. female.  Pt is a 59y;o female with hx of HTN, T2DM and Dyslipidemia and prior stroke with some difficulty with gait requiring a walker and recurrent UTI's presenting today from home with complaint of a fall.  Patient reports that she fell yesterday and today both in the bathroom.  She reports the same thing happened both days.  She was going to sit on the toilet to urinate and misjudged sitting down and fell to the floor.  She does not think she hit her head and was not unconscious.  She reports yesterday evening when this happened she had to sit on the floor until her son-in-law came home from work and then he helped her up.  Today the same thing happen and it occurred around 10 AM this morning and her phone was not charged and she had to scoot out into the hall and finally her daughter could not get a hold of her so went to check on her and they found her in the hall in her own feces and urine.  Patient reports that her left buttocks feels numb and sore but otherwise she feels her normal self.  She has not been able to have any of her medications today and she has not eaten because she was on the floor.  Her daughter is present during the exam and reports otherwise she has seemed to be her normal self.  Last week she did go to urgent care and was diagnosed with a yeast infection and she has been taking Diflucan.  She does report urinary frequency but denies dysuria.  She denies chest pain, shortness of breath, cough or fever.  The history is provided by the patient and a relative.  Fall       Home Medications Prior to Admission medications   Medication Sig Start Date End Date Taking? Authorizing Provider  acetaminophen (TYLENOL) 500 MG tablet Take 500 mg by mouth every 6  (six) hours as needed for moderate pain or headache.    [provider]  aspirin (ASPIR-LOW) 81 MG EC tablet Take 81 mg by mouth every morning.    [provider]  atorvastatin (LIPITOR) 20 MG tablet Take 1 tablet (20 mg total) by mouth every evening. 08/30/21   Shamleffer, Melanie Crazier, MD  B-D UF III MINI PEN NEEDLES 31G X 5 MM MISC SMARTSIG:injection 3 Times Daily 01/18/22   [provider]  carvedilol (COREG) 25 MG tablet Take 1 tablet (25 mg total) by mouth 2 (two) times daily. 12/18/20 03/02/22  Florencia Reasons, MD  Chlorphen-Phenyleph-ASA (ALKA-SELTZER PLUS COLD PO) Take 2 tablets by mouth every 6 (six) hours as needed (cold symptoms).    [provider]  Continuous Blood Gluc Sensor (DEXCOM G7 SENSOR) MISC 1 Device by Does not apply route as directed. 03/02/22   Shamleffer, Melanie Crazier, MD  dapagliflozin propanediol (FARXIGA) 10 MG TABS tablet Take 1 tablet (10 mg total) by mouth daily before breakfast. 03/02/22   Shamleffer, Melanie Crazier, MD  Diclofenac Sodium 3 % GEL Apply 2 g topically 2 (two) times daily as needed. Patient taking differently: Apply 2 g topically 2 (two) times daily as needed (pain). 04/10/20   Dickie La, MD  ELDERBERRY PO Take 1  tablet by mouth daily as needed (immune support). Gummy    [provider]  eplerenone (INSPRA) 25 MG tablet Take 25 mg by mouth daily. 12/23/21   [provider]  ezetimibe (ZETIA) 10 MG tablet Take 10 mg by mouth at bedtime. 08/21/19   [provider]  fluconazole (DIFLUCAN) 150 MG tablet Take 1 tablet (150 mg total) by mouth once as needed for up to 2 doses (take one pill on day 1, and the second pill 3 days later if symptoms do not improve). 07/30/22   Rudolpho Sevin, NP  insulin degludec (TRESIBA FLEXTOUCH) 200 UNIT/ML FlexTouch Pen Inject 80 Units into the skin daily in the afternoon. 03/02/22   Shamleffer, Melanie Crazier, MD  Insulin Pen Needle 32G X 4 MM MISC 1 Device by Does not  apply route as directed. 03/26/20   Shamleffer, Melanie Crazier, MD  losartan (COZAAR) 100 MG tablet Take 1 tablet (100 mg total) by mouth daily. 08/30/21   Shamleffer, Melanie Crazier, MD  magnesium oxide (MAG-OX) 400 MG tablet Take 1 tablet (400 mg total) by mouth daily. 01/03/20   Donato Heinz, MD  metFORMIN (GLUCOPHAGE) 500 MG tablet Take 1 tablet (500 mg total) by mouth daily with breakfast. 03/02/22   Shamleffer, Melanie Crazier, MD  NOVOLOG FLEXPEN 100 UNIT/ML FlexPen ADMINISTER 18 UNITS UNDER THE SKIN THREE TIMES DAILY WITH MEALS Patient taking differently: 20 Units 3 (three) times daily with meals. 02/08/22   Shamleffer, Melanie Crazier, MD  polyethylene glycol (MIRALAX / GLYCOLAX) 17 g packet Take 17 g by mouth daily as needed for mild constipation.    [provider]  potassium chloride SA (KLOR-CON) 20 MEQ tablet Take 1 tablet (20 mEq total) by mouth daily. Patient not taking: Reported on 08/30/2021 12/18/20 03/18/21  Florencia Reasons, MD  Sennosides (EX-LAX) 15 MG TABS Take 15 mg by mouth daily as needed (constipation).    [provider]  torsemide (DEMADEX) 20 MG tablet Take 60 mg by mouth daily. 12/24/21   [provider]      Allergies    Oxycodone-acetaminophen    Review of Systems   Review of Systems  Physical Exam Updated Vital Signs BP (!) 189/99   Pulse 89   Temp 98.1 F (36.7 C)   Resp (!) 23   SpO2 94%  Physical Exam Vitals and nursing note reviewed.  Constitutional:      General: She is not in acute distress.    Appearance: She is well-developed.  HENT:     Head: Normocephalic and atraumatic.     Mouth/Throat:     Mouth: Mucous membranes are dry.  Eyes:     Pupils: Pupils are equal, round, and reactive to light.  Cardiovascular:     Rate and Rhythm: Normal rate and regular rhythm.     Heart sounds: Normal heart sounds. No murmur heard.    No friction rub.  Pulmonary:     Effort: Pulmonary effort is normal.     Breath sounds:  Normal breath sounds. No wheezing or rales.  Abdominal:     General: Bowel sounds are normal. There is no distension.     Palpations: Abdomen is soft.     Tenderness: There is no abdominal tenderness. There is no guarding or rebound.  Musculoskeletal:        General: No tenderness. Normal range of motion.     Comments: No edema.  Able to range bilateral legs but pain with palpation over the left ishium  Skin:    General: Skin is warm and dry.     Findings: No rash.  Neurological:     Mental Status: She is alert and oriented to person, place, and time.     Cranial Nerves: No cranial nerve deficit.     Comments: Pt is able to lift both legs independently and has normal strength in her arms.  Mental status is normal  Psychiatric:        Behavior: Behavior normal.     ED Results / Procedures / Treatments   Labs (all labs ordered are listed, but only abnormal results are displayed) Labs Reviewed  CBC WITH DIFFERENTIAL/PLATELET - Abnormal; Notable for the following components:      Result Value   WBC 13.2 (*)    Neutro Abs 9.0 (*)    Monocytes Absolute 1.1 (*)    All other components within normal limits  URINALYSIS, ROUTINE W REFLEX MICROSCOPIC  BASIC METABOLIC PANEL  CK    EKG EKG Interpretation  Date/Time:  Friday August 05 2022 19:04:19 EST Ventricular Rate:  92 PR Interval:  141 QRS Duration: 94 QT Interval:  351 QTC Calculation: 435 R Axis:   51 Text Interpretation: Sinus rhythm probably lvh with secondary repol abnrm No significant change since last tracing Confirmed by Blanchie Dessert [phone]) on 08/05/2022 9:40:16 PM  Radiology CT Head Wo Contrast  Result Date: 08/05/2022 CLINICAL DATA:  Head trauma, minor (Age >= 65y), Unwitnessed fall EXAM: CT HEAD WITHOUT CONTRAST TECHNIQUE: Contiguous axial images were obtained from the base of the skull through the vertex without intravenous contrast. RADIATION DOSE REDUCTION: This exam was performed according to the  departmental dose-optimization program which includes automated exposure control, adjustment of the mA and/or kV according to patient size and/or use of iterative reconstruction technique. COMPARISON:  08/30/2021 FINDINGS: Brain: Normal anatomic configuration. Parenchymal volume loss is commensurate with the patient's age. Mild periventricular white matter changes are present likely reflecting the sequela of small vessel ischemia. Remote infarct within the left frontal periventricular white matter again noted. No abnormal intra or extra-axial mass lesion or fluid collection. No abnormal mass effect or midline shift. No evidence of acute intracranial hemorrhage or infarct. Ventricular size is normal. Cerebellum unremarkable. Vascular: No asymmetric hyperdense vasculature at the skull base. Skull: Intact Sinuses/Orbits: Paranasal sinuses are clear. Orbits are unremarkable. Other: Mastoid air cells and middle ear cavities are clear. IMPRESSION: 1. No acute intracranial abnormality. No calvarial fracture. 2. Mild senescent change. Remote left frontal periventricular white matter infarct. Electronically Signed   By: Fidela Salisbury M.D.   On: 08/05/2022 21:56   DG Hip Unilat W or Wo Pelvis 2-3 Views Left  Result Date: 08/05/2022 CLINICAL DATA:  Pain after fall. EXAM: DG HIP (WITH OR WITHOUT PELVIS) 2-3V LEFT COMPARISON:  None Available. FINDINGS: Large body habitus limits evaluation of fine bony detail. Mild bilateral sacroiliac subchondral sclerosis. Moderate bilateral femoroacetabular and pubic symphysis joint space narrowing. No acute fracture or dislocation. Mild-to-moderate vascular calcifications. 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 No acute fracture is seen, within limitations of patient large body habitus and decreased bone detail. Electronically Signed   By: Yvonne Kendall M.D.   On: 08/05/2022 21:35    Procedures Procedures    Medications Ordered in  ED Medications  lactated ringers bolus 1,000 mL (has no administration in time range)  acetaminophen (TYLENOL) tablet 1,000 mg (1,000 mg Oral Given 08/05/22 2041)  losartan (COZAAR) tablet 100 mg (100 mg Oral Given 08/05/22 2042)  ED Course/ Medical Decision Making/ A&P                             Medical Decision Making Amount and/or Complexity of Data Reviewed Labs: ordered. Decision-making details documented in ED Course. Radiology: ordered and independent interpretation performed. Decision-making details documented in ED Course. ECG/medicine tests: ordered and independent interpretation performed. Decision-making details documented in ED Course.  Risk OTC drugs. Prescription drug management.   Pt with multiple medical problems and comorbidities and presenting today with a complaint that caries a high risk for morbidity and mortality.  Here today after a fall and prolonged downtime.  Patient was found to be hyperglycemic and hypertensive but has not taken any medications today.  Patient did have a fall yesterday as well and reports that she misjudged sitting down in the bathroom.  Patient's daughter is present and reports no other unusual symptoms or behavior recently.  Patient is having some pain over her left hip but does not appear to have significant injury to the knees or ankles.  Imaging and labs are pending.  11:52 PM I have independently visualized and interpreted pt's images today.  Head CT without signs of bleeding and Pelvic film is neg for acute fracture.  Findings discussed with the patient.  I independently interpreted patient's CBC that does have a mild leukocytosis of 13 but normal hemoglobin.  Other labs have hemolyzed and are being resent.  Discussed patient's recent blood sugars and they have been running high and she reports that she is having a hard time keeping them controlled and having issues with the insulin she is taking.  She is being referred to an  endocrinologist but has not seen them yet.          Final Clinical Impression(s) / ED Diagnoses Final diagnoses:  None    Rx / DC Orders ED Discharge Orders     None         Blanchie Dessert, MD 08/05/22 2354

## 2022-08-05 NOTE — ED Triage Notes (Addendum)
Patient BIB EMS from home for fall at 10:00am this morning. Denies LOC, or hitting head. Found in urine and feces. C/o pain buttock, 220/146, CBG 466.

## 2022-08-06 DIAGNOSIS — M25552 Pain in left hip: Secondary | ICD-10-CM | POA: Diagnosis not present

## 2022-08-06 LAB — BASIC METABOLIC PANEL
Anion gap: 12 (ref 5–15)
BUN: 14 mg/dL (ref 8–23)
CO2: 31 mmol/L (ref 22–32)
Calcium: 9.5 mg/dL (ref 8.9–10.3)
Chloride: 97 mmol/L — ABNORMAL LOW (ref 98–111)
Creatinine, Ser: 0.86 mg/dL (ref 0.44–1.00)
GFR, Estimated: >60 mL/min (ref >60)
Glucose, Bld: 339 mg/dL — ABNORMAL HIGH (ref 70–99)
Potassium: 3.3 mmol/L — ABNORMAL LOW (ref 3.5–5.1)
Sodium: 140 mmol/L (ref 135–145)

## 2022-08-06 LAB — URINALYSIS, ROUTINE W REFLEX MICROSCOPIC
Bilirubin Urine: NEGATIVE
Glucose, UA: >=500 mg/dL — AB
Hgb urine dipstick: NEGATIVE
Ketones, ur: 20 mg/dL — AB
Leukocytes,Ua: NEGATIVE
Nitrite: NEGATIVE
Protein, ur: >=300 mg/dL — AB
Specific Gravity, Urine: 1.023 (ref 1.005–1.030)
pH: 6 (ref 5.0–8.0)

## 2022-08-06 LAB — CK: Total CK: 70 U/L (ref 38–234)

## 2022-08-06 MED ORDER — POTASSIUM CHLORIDE CRYS ER 20 MEQ PO TBCR
40.0000 meq | EXTENDED_RELEASE_TABLET | Freq: Once | ORAL | Status: AC
  Administered 2022-08-06: 40 meq via ORAL
  Filled 2022-08-06: qty 2

## 2022-08-06 NOTE — ED Notes (Signed)
Patient able to ambulates with assistant, states she used a walker or a cane at home to ambulates.

## 2022-08-06 NOTE — ED Provider Notes (Signed)
12:02 AM Assumed care from Dr. Maryan Rued, please see their note for full history, physical and decision making until this point. In brief this is a 67 y.o. year old female who presented to the ED tonight with Essie Christine a couple times in the last couple days. Not eating/taking meds 2/2 difficulty getting up. Ct/xr/VS reassuring. Pending BMP, likely discharge after ambulation.  Discharge instructions, including strict return precautions for new or worsening symptoms, given. Patient and/or family verbalized understanding and agreement with the plan as described.   Labs, studies and imaging reviewed by myself and considered in medical decision making if ordered. Imaging interpreted by radiology.  Labs Reviewed  CBC WITH DIFFERENTIAL/PLATELET - Abnormal; Notable for the following components:      Result Value   WBC 13.2 (*)    Neutro Abs 9.0 (*)    Monocytes Absolute 1.1 (*)    All other components within normal limits  URINALYSIS, ROUTINE W REFLEX MICROSCOPIC  BASIC METABOLIC PANEL  CK    CT Head Wo Contrast  Final Result    DG Hip Unilat W or Wo Pelvis 2-3 Views Left  Final Result      No follow-ups on file.

## 2022-08-07 ENCOUNTER — Telehealth: Payer: Self-pay

## 2022-08-07 NOTE — Telephone Encounter (Signed)
Patient presented on 2/22 to the ED with fall. She has fallen several times. Orders for [facility] RN and PT. Calling and messaging agencies to see if they will accept her. She may need to call PCP to assist. Will let her know of this if no calls back.

## 2022-08-07 NOTE — Telephone Encounter (Signed)
Amy from [facility] called back to say they will be able to service Ms Cauthen within 48 hours for PT services. Their PT services will assist with medication management .

## 2022-08-08 ENCOUNTER — Telehealth: Payer: Self-pay

## 2022-08-08 NOTE — Telephone Encounter (Signed)
Attempted to call patient to reschedule appointment but unable to leave message because voice mail full.  MyChart message sent to call office to reschedule appointment.

## 2022-08-08 NOTE — Telephone Encounter (Signed)
Please contact patient to reschedule upcoming appointment.

## 2022-08-17 ENCOUNTER — Emergency Department (HOSPITAL_BASED_OUTPATIENT_CLINIC_OR_DEPARTMENT_OTHER): Payer: 59

## 2022-08-17 ENCOUNTER — Emergency Department (HOSPITAL_COMMUNITY)
Admission: EM | Admit: 2022-08-17 | Discharge: 2022-08-17 | Disposition: A | Discharge status: Home / Self Care | Payer: 59 | Source: Home / Self Care | Attending: Emergency Medicine | Admitting: Emergency Medicine

## 2022-08-17 ENCOUNTER — Other Ambulatory Visit: Payer: Self-pay

## 2022-08-17 ENCOUNTER — Encounter (HOSPITAL_COMMUNITY): Payer: Self-pay | Admitting: Emergency Medicine

## 2022-08-17 ENCOUNTER — Emergency Department (HOSPITAL_COMMUNITY): Payer: 59

## 2022-08-17 DIAGNOSIS — Z7984 Long term (current) use of oral hypoglycemic drugs: Secondary | ICD-10-CM | POA: Insufficient documentation

## 2022-08-17 DIAGNOSIS — M7989 Other specified soft tissue disorders: Secondary | ICD-10-CM | POA: Insufficient documentation

## 2022-08-17 DIAGNOSIS — R600 Localized edema: Secondary | ICD-10-CM | POA: Insufficient documentation

## 2022-08-17 DIAGNOSIS — A419 Sepsis, unspecified organism: Secondary | ICD-10-CM | POA: Diagnosis not present

## 2022-08-17 DIAGNOSIS — M25561 Pain in right knee: Secondary | ICD-10-CM | POA: Insufficient documentation

## 2022-08-17 DIAGNOSIS — Z853 Personal history of malignant neoplasm of breast: Secondary | ICD-10-CM | POA: Insufficient documentation

## 2022-08-17 DIAGNOSIS — L03115 Cellulitis of right lower limb: Secondary | ICD-10-CM | POA: Diagnosis not present

## 2022-08-17 DIAGNOSIS — Z794 Long term (current) use of insulin: Secondary | ICD-10-CM | POA: Insufficient documentation

## 2022-08-17 DIAGNOSIS — Z7982 Long term (current) use of aspirin: Secondary | ICD-10-CM | POA: Insufficient documentation

## 2022-08-17 DIAGNOSIS — I509 Heart failure, unspecified: Secondary | ICD-10-CM | POA: Insufficient documentation

## 2022-08-17 DIAGNOSIS — Z79899 Other long term (current) drug therapy: Secondary | ICD-10-CM | POA: Insufficient documentation

## 2022-08-17 DIAGNOSIS — E119 Type 2 diabetes mellitus without complications: Secondary | ICD-10-CM | POA: Insufficient documentation

## 2022-08-17 DIAGNOSIS — I110 Hypertensive heart disease with heart failure: Secondary | ICD-10-CM | POA: Insufficient documentation

## 2022-08-17 DIAGNOSIS — M25469 Effusion, unspecified knee: Secondary | ICD-10-CM

## 2022-08-17 LAB — CBC WITH DIFFERENTIAL/PLATELET
Abs Immature Granulocytes: 0.04 10*3/uL (ref 0.00–0.07)
Basophils Absolute: 0 10*3/uL (ref 0.0–0.1)
Basophils Relative: 0 %
Eosinophils Absolute: 0.1 10*3/uL (ref 0.0–0.5)
Eosinophils Relative: 1 %
HCT: 39.4 % (ref 36.0–46.0)
Hemoglobin: 12 g/dL (ref 12.0–15.0)
Immature Granulocytes: 0 %
Lymphocytes Relative: 26 %
Lymphs Abs: 2.9 10*3/uL (ref 0.7–4.0)
MCH: 26.8 pg (ref 26.0–34.0)
MCHC: 30.5 g/dL (ref 30.0–36.0)
MCV: 87.9 fL (ref 80.0–100.0)
Monocytes Absolute: 1.1 10*3/uL — ABNORMAL HIGH (ref 0.1–1.0)
Monocytes Relative: 10 %
Neutro Abs: 7 10*3/uL (ref 1.7–7.7)
Neutrophils Relative %: 63 %
Platelets: 299 10*3/uL (ref 150–400)
RBC: 4.48 MIL/uL (ref 3.87–5.11)
RDW: 13.4 % (ref 11.5–15.5)
WBC: 11 10*3/uL — ABNORMAL HIGH (ref 4.0–10.5)
nRBC: 0 % (ref 0.0–0.2)

## 2022-08-17 LAB — BASIC METABOLIC PANEL
Anion gap: 11 (ref 5–15)
BUN: 19 mg/dL (ref 8–23)
CO2: 26 mmol/L (ref 22–32)
Calcium: 8.9 mg/dL (ref 8.9–10.3)
Chloride: 101 mmol/L (ref 98–111)
Creatinine, Ser: 0.73 mg/dL (ref 0.44–1.00)
GFR, Estimated: >60 mL/min (ref >60)
Glucose, Bld: 115 mg/dL — ABNORMAL HIGH (ref 70–99)
Potassium: 3.4 mmol/L — ABNORMAL LOW (ref 3.5–5.1)
Sodium: 138 mmol/L (ref 135–145)

## 2022-08-17 MED ORDER — ACETAMINOPHEN 500 MG PO TABS
1000.0000 mg | ORAL_TABLET | Freq: Once | ORAL | Status: AC
  Administered 2022-08-17: 1000 mg via ORAL
  Filled 2022-08-17: qty 2

## 2022-08-17 MED ORDER — OXYCODONE-ACETAMINOPHEN 5-325 MG PO TABS
1.0000 | ORAL_TABLET | Freq: Once | ORAL | Status: DC
  Filled 2022-08-17: qty 1

## 2022-08-17 MED ORDER — FUROSEMIDE 40 MG PO TABS: 40.0000 mg | ORAL_TABLET | Freq: Every day | ORAL | 0 refills | Status: DC

## 2022-08-17 NOTE — ED Provider Triage Note (Signed)
Emergency Medicine Provider Triage Evaluation Note  Whitney Sloan , a 67 y.o. female  was evaluated in triage.  Pt complains of R leg pain. Report her R leg gave out and she fell a few days ago.  Was evaluated in the ER and sts she has xray done of both knees which were negative.  States she has been taking tylenol but today report worsening pain to R leg/knee.  No back pain, no cp, sob.  No hx of DVT  Review of Systems  Positive: As above Negative: As above  Physical Exam  BP 133/88 (BP Location: Right Arm)   Pulse 83   Temp 98 F (36.7 C)   Resp 18   Ht '5\' 7"'$  (1.702 m)   Wt 109.8 kg   SpO2 97%   BMI 37.90 kg/m  Gen:   Awake, no distress   Resp:  Normal effort  MSK:   Moves extremities without difficulty  Other:  R knee tender.  RLE is edematous with 2+ pitting edema extending to thigh  Medical Decision Making  Medically screening exam initiated at 5:57 PM.  Appropriate orders placed.  APPOLONIA BERNSTEIN was informed that the remainder of the evaluation will be completed by another provider, this initial triage assessment does not replace that evaluation, and the importance of remaining in the ED until their evaluation is complete.  DVT study   Domenic Moras, PA-C 08/17/22 1759

## 2022-08-17 NOTE — ED Notes (Signed)
Patient verbalizes understanding of discharge instructions. Opportunity for questioning and answers were provided. Armband removed by staff, pt discharged from ED. Pt taken to ED waiting room via wheel chair.  

## 2022-08-17 NOTE — Progress Notes (Signed)
Right lower extremity venous study completed.   Preliminary results relayed to Domenic Moras, PA-C.   Please see CV Procedures for preliminary results.  Candie Chroman, RVT  6:36 PM 08/17/22

## 2022-08-17 NOTE — ED Provider Notes (Signed)
[facility] Provider Note   CSN: [id number] Arrival date & time: 08/17/22  1743     History  Chief Complaint  Patient presents with   Knee Pain    Whitney Sloan is a 67 y.o. female with history of uncontrolled T2DM, HLD, HTN, breast cancer, CHF (LV EF 50%, Echo 2023) who presents to the ER complaining of right knee pain.  Patient states that she had a fall off the commode last week and was seen in the ER.  She had been down for about 6 hours prior to ER arrival.  She had had x-rays done and were told they were unremarkable.  She said that she was doing well after the fall, but then earlier today started noticing more swelling in her right knee and lower leg.  She thought that she slept on it wrong.  Has otherwise been feeling well, no other injuries, no recent illness.   Knee Pain      Home Medications Prior to Admission medications   Medication Sig Start Date End Date Taking? Authorizing Provider  furosemide (LASIX) 40 MG tablet Take 1 tablet (40 mg total) by mouth daily for 5 days. 08/17/22 08/22/22 Yes Cinderella Christoffersen T, PA-C  acetaminophen (TYLENOL) 500 MG tablet Take 500 mg by mouth every 6 (six) hours as needed for moderate pain or headache.    [provider]  aspirin (ASPIR-LOW) 81 MG EC tablet Take 81 mg by mouth every morning.    [provider]  atorvastatin (LIPITOR) 20 MG tablet Take 1 tablet (20 mg total) by mouth every evening. 08/30/21   Shamleffer, Melanie Crazier, MD  B-D UF III MINI PEN NEEDLES 31G X 5 MM MISC SMARTSIG:injection 3 Times Daily 01/18/22   [provider]  carvedilol (COREG) 25 MG tablet Take 1 tablet (25 mg total) by mouth 2 (two) times daily. 12/18/20 03/02/22  Florencia Reasons, MD  Chlorphen-Phenyleph-ASA (ALKA-SELTZER PLUS COLD PO) Take 2 tablets by mouth every 6 (six) hours as needed (cold symptoms).    [provider]  Continuous Blood Gluc Sensor (DEXCOM G7 SENSOR) MISC 1 Device  by Does not apply route as directed. 03/02/22   Shamleffer, Melanie Crazier, MD  dapagliflozin propanediol (FARXIGA) 10 MG TABS tablet Take 1 tablet (10 mg total) by mouth daily before breakfast. 03/02/22   Shamleffer, Melanie Crazier, MD  Diclofenac Sodium 3 % GEL Apply 2 g topically 2 (two) times daily as needed. Patient taking differently: Apply 2 g topically 2 (two) times daily as needed (pain). 04/10/20   Dickie La, MD  ELDERBERRY PO Take 1 tablet by mouth daily as needed (immune support). Gummy    [provider]  eplerenone (INSPRA) 25 MG tablet Take 25 mg by mouth daily. 12/23/21   [provider]  ezetimibe (ZETIA) 10 MG tablet Take 10 mg by mouth at bedtime. 08/21/19   [provider]  fluconazole (DIFLUCAN) 150 MG tablet Take 1 tablet (150 mg total) by mouth once as needed for up to 2 doses (take one pill on day 1, and the second pill 3 days later if symptoms do not improve). 07/30/22   Rudolpho Sevin, NP  insulin degludec (TRESIBA FLEXTOUCH) 200 UNIT/ML FlexTouch Pen Inject 80 Units into the skin daily in the afternoon. 03/02/22   Shamleffer, Melanie Crazier, MD  Insulin Pen Needle 32G X 4 MM MISC 1 Device by Does not apply route as directed. 03/26/20   Shamleffer, Melanie Crazier,  MD  losartan (COZAAR) 100 MG tablet Take 1 tablet (100 mg total) by mouth daily. 08/30/21   Shamleffer, Melanie Crazier, MD  magnesium oxide (MAG-OX) 400 MG tablet Take 1 tablet (400 mg total) by mouth daily. 01/03/20   Donato Heinz, MD  metFORMIN (GLUCOPHAGE) 500 MG tablet Take 1 tablet (500 mg total) by mouth daily with breakfast. 03/02/22   Shamleffer, Melanie Crazier, MD  NOVOLOG FLEXPEN 100 UNIT/ML FlexPen ADMINISTER 18 UNITS UNDER THE SKIN THREE TIMES DAILY WITH MEALS Patient taking differently: 20 Units 3 (three) times daily with meals. 02/08/22   Shamleffer, Melanie Crazier, MD  polyethylene glycol (MIRALAX / GLYCOLAX) 17 g packet Take 17 g by mouth daily as needed for mild  constipation.    [provider]  potassium chloride SA (KLOR-CON) 20 MEQ tablet Take 1 tablet (20 mEq total) by mouth daily. Patient not taking: Reported on 08/30/2021 12/18/20 03/18/21  Florencia Reasons, MD  Sennosides (EX-LAX) 15 MG TABS Take 15 mg by mouth daily as needed (constipation).    [provider]  torsemide (DEMADEX) 20 MG tablet Take 60 mg by mouth daily. 12/24/21   [provider]      Allergies    Oxycodone-acetaminophen    Review of Systems   Review of Systems  Cardiovascular:  Positive for leg swelling.  Musculoskeletal:  Positive for arthralgias.  All other systems reviewed and are negative.   Physical Exam Updated Vital Signs BP (!) 171/83 (BP Location: Left Arm)   Pulse 75   Temp 98.1 F (36.7 C) (Oral)   Resp 16   Ht 5\' 7"  (1.702 m)   Wt 109.8 kg   SpO2 100%   BMI 37.90 kg/m  Physical Exam Vitals and nursing note reviewed.  Constitutional:      Appearance: Normal appearance.  HENT:     Head: Normocephalic and atraumatic.  Eyes:     Conjunctiva/sclera: Conjunctivae normal.  Cardiovascular:     Rate and Rhythm: Normal rate and regular rhythm.     Pulses:          Posterior tibial pulses are 2+ on the right side and 2+ on the left side.     Comments: 2+ pitting edema noted to the level of the right lower thigh Pulmonary:     Effort: Pulmonary effort is normal. No respiratory distress.     Breath sounds: Normal breath sounds.  Abdominal:     General: There is no distension.     Palpations: Abdomen is soft.     Tenderness: There is no abdominal tenderness.  Musculoskeletal:     Right lower leg: 2+ Edema present.     Left lower leg: No edema.     Comments: Right knee and right calf edematous. No erythema or breaks in the skin. No joint effusion. Normal passive ROM, but pain elicited with knee flexion.   Skin:    General: Skin is warm and dry.  Neurological:     General: No focal deficit present.     Mental Status: She is alert.      ED Results / Procedures / Treatments   Labs (all labs ordered are listed, but only abnormal results are displayed) Labs Reviewed  CBC WITH DIFFERENTIAL/PLATELET - Abnormal; Notable for the following components:      Result Value   WBC 11.0 (*)    Monocytes Absolute 1.1 (*)    All other components within normal limits  BASIC METABOLIC PANEL - Abnormal; Notable for the following  components:   Potassium 3.4 (*)    Glucose, Bld 115 (*)    All other components within normal limits    EKG None  Radiology DG Knee Complete 4 Views Right  Result Date: 08/17/2022 CLINICAL DATA:  Fall, knee pain EXAM: RIGHT KNEE - COMPLETE 4+ VIEW COMPARISON:  None Available. FINDINGS: Moderate to large joint effusion. No well-defined fracture, but there is irregularity noted in the region of the tibial spines. This could be related to spurring, but difficult to completely exclude acute injury. No subluxation or dislocation. Soft tissues are intact. IMPRESSION: No visible fracture. Irregularity noted in the region of the tibial spines which could reflect degenerative changes, but difficult to completely exclude acute injury with the moderate to large joint effusion. Consider further evaluation with CT. Electronically Signed   By: Rolm Baptise M.D.   On: 08/17/2022 22:01   VAS [place] LOWER EXTREMITY VENOUS (DVT) (7a-7p)  Result Date: 08/17/2022  Lower Venous DVT Study Patient Name:  EMIKO OSORTO  Date of Exam:   08/17/2022 Medical Rec #: [id number]        Accession #:    [id number] Date of Birth: 01-16-1956         Patient Gender: F Patient Age:   64 years Exam Location:  [facility] Procedure:      VAS [place] LOWER EXTREMITY VENOUS (DVT) Referring Phys: Domenic Moras --------------------------------------------------------------------------------  Indications: Knee pain and swelling, Recent fall.  Comparison Study: Previous 12/10/19 negative. Performing Technologist: McKayla Maag RVT, [place]  Examination Guidelines: A  complete evaluation includes B-mode imaging, spectral Doppler, color Doppler, and power Doppler as needed of all accessible portions of each vessel. Bilateral testing is considered an integral part of a complete examination. Limited examinations for reoccurring indications may be performed as noted. The reflux portion of the exam is performed with the patient in reverse Trendelenburg.  +---------+---------------+---------+-----------+----------+-------------------+ RIGHT    CompressibilityPhasicitySpontaneityPropertiesThrombus Aging      +---------+---------------+---------+-----------+----------+-------------------+ CFV      Full           Yes      Yes                                      +---------+---------------+---------+-----------+----------+-------------------+ SFJ      Full                                                             +---------+---------------+---------+-----------+----------+-------------------+ FV Prox  Full                                                             +---------+---------------+---------+-----------+----------+-------------------+ FV Mid   Full                                                             +---------+---------------+---------+-----------+----------+-------------------+ FV DistalFull                                                             +---------+---------------+---------+-----------+----------+-------------------+  PFV      Full                                                             +---------+---------------+---------+-----------+----------+-------------------+ POP      Full           Yes      Yes                                      +---------+---------------+---------+-----------+----------+-------------------+ PTV      Full                                                             +---------+---------------+---------+-----------+----------+-------------------+ PERO     Full                                          Limited                                                                   visualization due                                                         to vessel depth     +---------+---------------+---------+-----------+----------+-------------------+   +----+---------------+---------+-----------+----------+--------------+ LEFTCompressibilityPhasicitySpontaneityPropertiesThrombus Aging +----+---------------+---------+-----------+----------+--------------+ CFV Full           Yes      Yes                                 +----+---------------+---------+-----------+----------+--------------+ SFJ Full                                                        +----+---------------+---------+-----------+----------+--------------+     Summary: RIGHT: - There is no evidence of deep vein thrombosis in the lower extremity.  - No cystic structure found in the popliteal fossa.  LEFT: - No evidence of common femoral vein obstruction.  *See table(s) above for measurements and observations. Electronically signed by Harold Barban MD on 08/17/2022 at 9:23:18 PM.    Final     Procedures Procedures    Medications Ordered in ED Medications  acetaminophen (TYLENOL) tablet 1,000 mg (1,000 mg Oral Given 08/17/22 1810)    ED Course/ Medical Decision Making/ A&P  Medical Decision Making Amount and/or Complexity of Data Reviewed Radiology: ordered.  Risk Prescription drug management.  This patient is a 67 y.o. female  who presents to the ED for concern of right knee pain and swelling.   Differential diagnoses prior to evaluation: The emergent differential diagnosis includes, but is not limited to, fluid retention, septic arthritis, cellulitis, ligamentous injury. This is not an exhaustive differential.   Past Medical History / Co-morbidities: uncontrolled T2DM, HLD, HTN, breast cancer, CHF (LV EF 50%, Echo 2023)  Additional  history: Chart reviewed. Pertinent results include: ER visit from 2/23 reviewed, patient only had imaging performed of her pelvis and her head.  No knee x-rays obtained at that time.  Physical Exam: Physical exam performed. The pertinent findings include: Hypertensive, otherwise normal vital signs.  In no acute distress.  Generalized right knee swelling with moderate effusion.  Overall increased warmth of the right leg compared to the left.  2+ pitting edema to the level of the lower right thigh.  Strong distal pulses.  No breaks in the skin, erythema, or evidence of cellulitis.  Lab Tests/Imaging studies: I personally interpreted labs/imaging and the pertinent results include: Mild leukocytosis of 11, BMP unremarkable.  DVT study of the right leg with no evidence of DVT.  X-ray of the right knee showing degenerative changes, and moderate joint effusion.  There was concern for possible acute injury to the tibial spine, patient has no point tenderness to correlate clinically with this.    Disposition: After consideration of the diagnostic results and the patients response to treatment, I feel that emergency department workup does not suggest an emergent condition requiring admission or immediate intervention beyond what has been performed at this time. The plan is: Discharged home with Lasix for likely fluid retention.  Patient has a history of heart failure and was taken off diuretics.  The swelling of her right leg including the pitting edema is more concerning for fluid retention in this clinical setting.  Not concern for septic arthritis at this time as patient is otherwise been feeling well, the joint has no erythema or increased warmth.  Will start on 5 days of Lasix and have her get her labs rechecked next week.  The patient is safe for discharge and has been instructed to return immediately for worsening symptoms, change in symptoms or any other concerns.  Final Clinical Impression(s) / ED  Diagnoses Final diagnoses:  Acute pain of right knee  Knee swelling    Rx / DC Orders ED Discharge Orders          Ordered    furosemide (LASIX) 40 MG tablet  Daily        08/17/22 2238           Portions of this report may have been transcribed using voice recognition software. Every effort was made to ensure accuracy; however, inadvertent computerized transcription errors may be present.    Estill Cotta 08/18/22 0033    Dorie Rank, MD 08/19/22 [phone]

## 2022-08-17 NOTE — ED Triage Notes (Addendum)
Pt c/o right knee pain that worsened today. Pt had fall on Friday and was evaluated for bilateral knee pain with no injuries found per patient. No deformity noted per ems.   BP 144/96, HR 82, RR 18, Spo2 98% ra, CBG 142

## 2022-08-17 NOTE — Discharge Instructions (Addendum)
You were seen in the ER for right knee pain and swelling.  Your ultrasound and x-ray imaging looked reassuring today.  Your blood work was reassuring.  I think that you could have some swelling in that leg from holding onto too much fluid.  I am restarting you on Lasix for the next couple days.  I want you to have repeat blood work with your primary doctor next week, so they can also evaluate your leg swelling.  Make sure that you are in compression stockings during the day, and getting up frequently to move around.  Continue to monitor how you're doing and return to the ER for new or worsening symptoms such as fever, redness or increased warmth of the joint (concern for infection).

## 2022-08-20 ENCOUNTER — Encounter (HOSPITAL_COMMUNITY): Payer: Self-pay

## 2022-08-20 ENCOUNTER — Inpatient Hospital Stay (HOSPITAL_COMMUNITY)
Admission: EM | Admit: 2022-08-20 | Discharge: 2022-08-29 | DRG: 872 | Disposition: A | Discharge status: Skilled Nursing Facility | Payer: 59 | Attending: Internal Medicine | Admitting: Internal Medicine

## 2022-08-20 ENCOUNTER — Other Ambulatory Visit: Payer: Self-pay

## 2022-08-20 DIAGNOSIS — Z833 Family history of diabetes mellitus: Secondary | ICD-10-CM | POA: Diagnosis not present

## 2022-08-20 DIAGNOSIS — Z7982 Long term (current) use of aspirin: Secondary | ICD-10-CM

## 2022-08-20 DIAGNOSIS — Z7984 Long term (current) use of oral hypoglycemic drugs: Secondary | ICD-10-CM

## 2022-08-20 DIAGNOSIS — E785 Hyperlipidemia, unspecified: Secondary | ICD-10-CM | POA: Diagnosis present

## 2022-08-20 DIAGNOSIS — E1165 Type 2 diabetes mellitus with hyperglycemia: Secondary | ICD-10-CM | POA: Diagnosis present

## 2022-08-20 DIAGNOSIS — W19XXXA Unspecified fall, initial encounter: Secondary | ICD-10-CM

## 2022-08-20 DIAGNOSIS — Y92009 Unspecified place in unspecified non-institutional (private) residence as the place of occurrence of the external cause: Secondary | ICD-10-CM | POA: Diagnosis not present

## 2022-08-20 DIAGNOSIS — L03115 Cellulitis of right lower limb: Secondary | ICD-10-CM | POA: Diagnosis present

## 2022-08-20 DIAGNOSIS — E538 Deficiency of other specified B group vitamins: Secondary | ICD-10-CM | POA: Diagnosis present

## 2022-08-20 DIAGNOSIS — I110 Hypertensive heart disease with heart failure: Secondary | ICD-10-CM | POA: Diagnosis present

## 2022-08-20 DIAGNOSIS — M109 Gout, unspecified: Secondary | ICD-10-CM | POA: Diagnosis present

## 2022-08-20 DIAGNOSIS — E119 Type 2 diabetes mellitus without complications: Secondary | ICD-10-CM

## 2022-08-20 DIAGNOSIS — E11649 Type 2 diabetes mellitus with hypoglycemia without coma: Secondary | ICD-10-CM | POA: Diagnosis not present

## 2022-08-20 DIAGNOSIS — Z794 Long term (current) use of insulin: Secondary | ICD-10-CM | POA: Diagnosis not present

## 2022-08-20 DIAGNOSIS — Z79899 Other long term (current) drug therapy: Secondary | ICD-10-CM

## 2022-08-20 DIAGNOSIS — Z853 Personal history of malignant neoplasm of breast: Secondary | ICD-10-CM | POA: Diagnosis not present

## 2022-08-20 DIAGNOSIS — I5032 Chronic diastolic (congestive) heart failure: Secondary | ICD-10-CM | POA: Diagnosis present

## 2022-08-20 DIAGNOSIS — R0902 Hypoxemia: Secondary | ICD-10-CM | POA: Diagnosis present

## 2022-08-20 DIAGNOSIS — I69354 Hemiplegia and hemiparesis following cerebral infarction affecting left non-dominant side: Secondary | ICD-10-CM | POA: Diagnosis not present

## 2022-08-20 DIAGNOSIS — E782 Mixed hyperlipidemia: Secondary | ICD-10-CM | POA: Diagnosis not present

## 2022-08-20 DIAGNOSIS — N390 Urinary tract infection, site not specified: Secondary | ICD-10-CM | POA: Diagnosis present

## 2022-08-20 DIAGNOSIS — Z6835 Body mass index (BMI) 35.0-35.9, adult: Secondary | ICD-10-CM | POA: Diagnosis not present

## 2022-08-20 DIAGNOSIS — E876 Hypokalemia: Secondary | ICD-10-CM | POA: Diagnosis present

## 2022-08-20 DIAGNOSIS — I10 Essential (primary) hypertension: Secondary | ICD-10-CM | POA: Diagnosis not present

## 2022-08-20 DIAGNOSIS — R159 Full incontinence of feces: Secondary | ICD-10-CM | POA: Diagnosis present

## 2022-08-20 DIAGNOSIS — R32 Unspecified urinary incontinence: Secondary | ICD-10-CM | POA: Diagnosis present

## 2022-08-20 DIAGNOSIS — A419 Sepsis, unspecified organism: Secondary | ICD-10-CM | POA: Diagnosis present

## 2022-08-20 LAB — CBC WITH DIFFERENTIAL/PLATELET
Abs Immature Granulocytes: 0.08 10*3/uL — ABNORMAL HIGH (ref 0.00–0.07)
Basophils Absolute: 0 10*3/uL (ref 0.0–0.1)
Basophils Relative: 0 %
Eosinophils Absolute: 0 10*3/uL (ref 0.0–0.5)
Eosinophils Relative: 0 %
HCT: 38.8 % (ref 36.0–46.0)
Hemoglobin: 12 g/dL (ref 12.0–15.0)
Immature Granulocytes: 1 %
Lymphocytes Relative: 19 %
Lymphs Abs: 2.8 10*3/uL (ref 0.7–4.0)
MCH: 26.8 pg (ref 26.0–34.0)
MCHC: 30.9 g/dL (ref 30.0–36.0)
MCV: 86.6 fL (ref 80.0–100.0)
Monocytes Absolute: 1.4 10*3/uL — ABNORMAL HIGH (ref 0.1–1.0)
Monocytes Relative: 10 %
Neutro Abs: 10.2 10*3/uL — ABNORMAL HIGH (ref 1.7–7.7)
Neutrophils Relative %: 70 %
Platelets: 286 10*3/uL (ref 150–400)
RBC: 4.48 MIL/uL (ref 3.87–5.11)
RDW: 13.5 % (ref 11.5–15.5)
WBC: 14.6 10*3/uL — ABNORMAL HIGH (ref 4.0–10.5)
nRBC: 0 % (ref 0.0–0.2)

## 2022-08-20 LAB — CBG MONITORING, ED: Glucose-Capillary: 140 mg/dL — ABNORMAL HIGH (ref 70–99)

## 2022-08-20 LAB — COMPREHENSIVE METABOLIC PANEL
ALT: 13 U/L (ref 0–44)
AST: 24 U/L (ref 15–41)
Albumin: 3.2 g/dL — ABNORMAL LOW (ref 3.5–5.0)
Alkaline Phosphatase: 67 U/L (ref 38–126)
Anion gap: 12 (ref 5–15)
BUN: 22 mg/dL (ref 8–23)
CO2: 27 mmol/L (ref 22–32)
Calcium: 8.9 mg/dL (ref 8.9–10.3)
Chloride: 98 mmol/L (ref 98–111)
Creatinine, Ser: 0.85 mg/dL (ref 0.44–1.00)
GFR, Estimated: >60 mL/min (ref >60)
Glucose, Bld: 121 mg/dL — ABNORMAL HIGH (ref 70–99)
Potassium: 4.6 mmol/L (ref 3.5–5.1)
Sodium: 137 mmol/L (ref 135–145)
Total Bilirubin: 1 mg/dL (ref 0.3–1.2)
Total Protein: 7.7 g/dL (ref 6.5–8.1)

## 2022-08-20 LAB — LACTIC ACID, PLASMA: Lactic Acid, Venous: 1.4 mmol/L (ref 0.5–1.9)

## 2022-08-20 MED ORDER — CARVEDILOL 25 MG PO TABS
25.0000 mg | ORAL_TABLET | Freq: Two times a day (BID) | ORAL | Status: DC
  Administered 2022-08-21 – 2022-08-29 (×17): 25 mg via ORAL
  Filled 2022-08-20 (×10): qty 1
  Filled 2022-08-20: qty 2
  Filled 2022-08-20 (×7): qty 1

## 2022-08-20 MED ORDER — FENTANYL CITRATE PF 50 MCG/ML IJ SOSY: 50.0000 ug | PREFILLED_SYRINGE | INTRAMUSCULAR | Status: DC | PRN

## 2022-08-20 MED ORDER — INSULIN ASPART 100 UNIT/ML IJ SOLN
0.0000 [IU] | Freq: Three times a day (TID) | INTRAMUSCULAR | Status: DC
  Administered 2022-08-21: 5 [IU] via SUBCUTANEOUS
  Administered 2022-08-21: 3 [IU] via SUBCUTANEOUS
  Administered 2022-08-21: 11 [IU] via SUBCUTANEOUS
  Administered 2022-08-22: 8 [IU] via SUBCUTANEOUS
  Administered 2022-08-22: 5 [IU] via SUBCUTANEOUS
  Administered 2022-08-22: 8 [IU] via SUBCUTANEOUS
  Administered 2022-08-23 (×2): 3 [IU] via SUBCUTANEOUS
  Administered 2022-08-23: 2 [IU] via SUBCUTANEOUS
  Administered 2022-08-24 (×2): 3 [IU] via SUBCUTANEOUS
  Administered 2022-08-25 – 2022-08-28 (×6): 2 [IU] via SUBCUTANEOUS
  Administered 2022-08-28: 3 [IU] via SUBCUTANEOUS
  Administered 2022-08-29: 5 [IU] via SUBCUTANEOUS
  Filled 2022-08-20: qty 0.15

## 2022-08-20 MED ORDER — MELATONIN 3 MG PO TABS: 3.0000 mg | ORAL_TABLET | Freq: Every evening | ORAL | Status: DC | PRN

## 2022-08-20 MED ORDER — INSULIN GLARGINE-YFGN 100 UNIT/ML ~~LOC~~ SOLN
40.0000 [IU] | Freq: Every day | SUBCUTANEOUS | Status: DC
  Administered 2022-08-21: 40 [IU] via SUBCUTANEOUS
  Filled 2022-08-20: qty 0.4

## 2022-08-20 MED ORDER — ATORVASTATIN CALCIUM 20 MG PO TABS
20.0000 mg | ORAL_TABLET | Freq: Every evening | ORAL | Status: DC
  Administered 2022-08-21 – 2022-08-28 (×8): 20 mg via ORAL
  Filled 2022-08-20 (×8): qty 1

## 2022-08-20 MED ORDER — EZETIMIBE 10 MG PO TABS
10.0000 mg | ORAL_TABLET | Freq: Every day | ORAL | Status: DC
  Administered 2022-08-21 – 2022-08-28 (×9): 10 mg via ORAL
  Filled 2022-08-20 (×11): qty 1

## 2022-08-20 MED ORDER — LOSARTAN POTASSIUM 50 MG PO TABS
100.0000 mg | ORAL_TABLET | Freq: Every day | ORAL | Status: DC
  Administered 2022-08-21 – 2022-08-29 (×9): 100 mg via ORAL
  Filled 2022-08-20 (×9): qty 2

## 2022-08-20 MED ORDER — SODIUM CHLORIDE 0.9 % IV SOLN: 1.0000 g | INTRAVENOUS | Status: DC

## 2022-08-20 MED ORDER — NALOXONE HCL 0.4 MG/ML IJ SOLN: 0.4000 mg | INTRAMUSCULAR | Status: DC | PRN

## 2022-08-20 MED ORDER — SODIUM CHLORIDE 0.9 % IV SOLN
1.0000 g | Freq: Once | INTRAVENOUS | Status: AC
  Administered 2022-08-20: 1 g via INTRAVENOUS
  Filled 2022-08-20: qty 10

## 2022-08-20 MED ORDER — ACETAMINOPHEN 650 MG RE SUPP: 650.0000 mg | Freq: Four times a day (QID) | RECTAL | Status: DC | PRN

## 2022-08-20 MED ORDER — ENOXAPARIN SODIUM 40 MG/0.4ML IJ SOSY
40.0000 mg | PREFILLED_SYRINGE | INTRAMUSCULAR | Status: DC
  Administered 2022-08-21: 40 mg via SUBCUTANEOUS
  Filled 2022-08-20: qty 0.4

## 2022-08-20 MED ORDER — VANCOMYCIN HCL 2000 MG/400ML IV SOLN
2000.0000 mg | Freq: Once | INTRAVENOUS | Status: AC
  Administered 2022-08-20: 2000 mg via INTRAVENOUS
  Filled 2022-08-20: qty 400

## 2022-08-20 MED ORDER — ONDANSETRON HCL 4 MG/2ML IJ SOLN: 4.0000 mg | Freq: Four times a day (QID) | INTRAMUSCULAR | Status: DC | PRN

## 2022-08-20 MED ORDER — ACETAMINOPHEN 325 MG PO TABS
650.0000 mg | ORAL_TABLET | Freq: Four times a day (QID) | ORAL | Status: DC | PRN
  Administered 2022-08-21: 650 mg via ORAL
  Filled 2022-08-20 (×2): qty 2

## 2022-08-20 MED ORDER — ASPIRIN 81 MG PO TBEC
81.0000 mg | DELAYED_RELEASE_TABLET | ORAL | Status: DC
  Administered 2022-08-21 – 2022-08-29 (×9): 81 mg via ORAL
  Filled 2022-08-20 (×9): qty 1

## 2022-08-20 NOTE — H&P (Signed)
History and Physical      Whitney Sloan [id number] DOB: [birth year] DOA: 08/20/2022  PCP: Nolene Ebbs, MD  Patient coming from: home   I have personally briefly reviewed patient's old medical records in [place]  Chief Complaint: Right lower extremity erythema  HPI: Whitney Sloan is a 67 y.o. female with medical history significant for chronic diastolic heart failure, type 2 diabetes mellitus, essential hypertension, hyperlipidemia, who is admitted to [facility] on 08/20/2022 with sepsis due to right lower extremity cellulitis after presenting from home to [facility] ED complaining of right lower extremity erythema.   The patient had originally presented to [facility] emergency department on 08/17/2022 for evaluation of a ground-level mechanical fall occurred earlier that day.  As component of that fall, she had sustained an abrasion to the anterior aspect of the right lower extremity just distal to the right knee.  Did not hit her head as a component of this fall.  Is on daily baby aspirin, but otherwise no blood thinners as an outpatient.  She notes that this fall was purely mechanical in nature, reporting that she tripped while attempting to ambulate at home, noting that the right lower extremity, as described above, served as the principal point contact with the floor below.  Not associated with any loss of consciousness.  While in the ED on 08/17/2022, she underwent plain films of the right lower extremity, which showed no evidence of acute fracture or subcutaneous edema.  She was subsequently discharged to home from the ED, but returns back to the ED this evening, reporting interval development of right lower extremity erythema since with the anterior aspect of the right lower extremity, distal to the right knee and associate with tenderness, increased warmth, swelling, in the absence of any associated drainage..  She denies any associated acute focal numbness or paresthesias nor  any associated acute focal weakness.  Denies rash in any other location.  No interval falls.  Denies any associated subjective fever, chills, rigors, or generalized myalgias.  No recent cough, shortness of breath, headache, neck stiffness, abdominal pain, diarrhea, dysuria, or gross hematuria.  She also denies any recent chest pain, palpitations, diaphoresis, nausea, vomiting.  Medical history notable for type 2 diabetes mellitus, with most recent hemoglobin A1c 10.3% in September 2023.  She also has a history of chronic diastolic heart failure, with most recent echo in March 2023, which was notable for LVEF 50%, no focal wall motion abnormalities, grade 2 diastolic dysfunction, normal right ventricular systolic function, mildly dilated left atrium, no evidence of significant valvular pathology.     ED Course:  Vital signs in the ED were notable for the following: Afebrile; heart rate 10 3-1 1220.  Blood pressure in the range of 120s to 170s; respiratory rate 14-20, oxygen saturation 95 to 90% on room air.  Labs were notable for the following: CMP notable for the following: Sodium 137, bicarbonate 27, creatinine 0.85 compared to most recent prior serum creatinine data point of 0.75 on 08/17/2022, glucose 121, liver enzymes within normal limits.  Lactic acid 1.4 per CBC notable for white blood cell count of 14,600, representing an increase from most recent provide 11,000 on 08/17/2022, with presenting episode count associate with 70% neutrophils as opposed to 63% neutrophils on 08/17/2022.  Blood cultures x 2 collected prior to initiation of IV antibiotics.  Imaging and additional notable ED work-up: (No new imaging performed in the ED today).  While in the ED, the following were  administered: Rocephin.  Subsequently, the patient was admitted for further evaluation management sepsis due to cellulitis of the right lower extremity.    Review of Systems: As per HPI otherwise 10 point review of systems  negative.   Past Medical History:  Diagnosis Date   Abnormal stress test 12/31/2019   medium defect of moderate severity present in the basal inferior, mid inferior and apex location.  Findings consistent with ischemia   Chronic diastolic (congestive) heart failure (HCC)    Diabetes mellitus    Hyperlipemia    Hypertension    Psoriasis     Past Surgical History:  Procedure Laterality Date   ANAL FISSURE REPAIR     ENDOMETRIAL ABLATION      Social History:  reports that she has never smoked. She has been exposed to tobacco smoke. She has never used smokeless tobacco. She reports that she does not drink alcohol and does not use drugs.   Allergies  Allergen Reactions   Oxycodone-Acetaminophen Nausea And Vomiting    Family History  Problem Relation Age of Onset   Dementia Mother    Renal Disease Father    Diabetes Other      Prior to Admission medications   Medication Sig Start Date End Date Taking? Authorizing Provider  aspirin (ASPIR-LOW) 81 MG EC tablet Take 81 mg by mouth every morning.   Yes [provider]  atorvastatin (LIPITOR) 20 MG tablet Take 1 tablet (20 mg total) by mouth every evening. 08/30/21  Yes Shamleffer, Melanie Crazier, MD  carvedilol (COREG) 25 MG tablet Take 1 tablet (25 mg total) by mouth 2 (two) times daily. 12/18/20 08/20/22 Yes Florencia Reasons, MD  Chlorphen-Phenyleph-ASA (ALKA-SELTZER PLUS COLD PO) Take 2 tablets by mouth every 6 (six) hours as needed (cold symptoms).   Yes [provider]  dapagliflozin propanediol (FARXIGA) 10 MG TABS tablet Take 1 tablet (10 mg total) by mouth daily before breakfast. 03/02/22  Yes Shamleffer, Melanie Crazier, MD  eplerenone (INSPRA) 25 MG tablet Take 25 mg by mouth daily. 12/23/21  Yes [provider]  ezetimibe (ZETIA) 10 MG tablet Take 10 mg by mouth at bedtime. 08/21/19  Yes [provider]  furosemide (LASIX) 40 MG tablet Take 1 tablet (40 mg total) by mouth daily for 5 days. 08/17/22  08/22/22 Yes Roemhildt, Lorin T, PA-C  insulin degludec (TRESIBA FLEXTOUCH) 200 UNIT/ML FlexTouch Pen Inject 80 Units into the skin daily in the afternoon. 03/02/22  Yes Shamleffer, Melanie Crazier, MD  losartan (COZAAR) 100 MG tablet Take 1 tablet (100 mg total) by mouth daily. 08/30/21  Yes Shamleffer, Melanie Crazier, MD  metFORMIN (GLUCOPHAGE) 500 MG tablet Take 1 tablet (500 mg total) by mouth daily with breakfast. 03/02/22  Yes Shamleffer, Melanie Crazier, MD  NOVOLOG FLEXPEN 100 UNIT/ML FlexPen ADMINISTER 18 UNITS UNDER THE SKIN THREE TIMES DAILY WITH MEALS Patient taking differently: 20 Units 3 (three) times daily with meals. 02/08/22  Yes Shamleffer, Melanie Crazier, MD  polyethylene glycol (MIRALAX / GLYCOLAX) 17 g packet Take 17 g by mouth daily as needed for mild constipation.   Yes [provider]  potassium chloride SA (KLOR-CON) 20 MEQ tablet Take 1 tablet (20 mEq total) by mouth daily. 12/18/20 08/20/22 Yes Florencia Reasons, MD  Sennosides (EX-LAX) 15 MG TABS Take 15 mg by mouth daily as needed (constipation).   Yes [provider]  tamsulosin (FLOMAX) 0.4 MG CAPS capsule Take 0.4 mg by mouth daily. 07/08/22  Yes [provider]  torsemide (DEMADEX) 20  MG tablet Take 60 mg by mouth daily. 12/24/21  Yes [provider]  VITAMIN D, CHOLECALCIFEROL, PO Take 1 capsule by mouth daily.   Yes [provider]  acetaminophen (TYLENOL) 500 MG tablet Take 500 mg by mouth every 6 (six) hours as needed for moderate pain or headache.    [provider]  B-D UF III MINI PEN NEEDLES 31G X 5 MM MISC SMARTSIG:injection 3 Times Daily 01/18/22   [provider]  Continuous Blood Gluc Sensor (DEXCOM G7 SENSOR) MISC 1 Device by Does not apply route as directed. 03/02/22   Shamleffer, Melanie Crazier, MD  Diclofenac Sodium 3 % GEL Apply 2 g topically 2 (two) times daily as needed. Patient taking differently: Apply 2 g topically 2 (two) times daily as needed (pain).  04/10/20   Dickie La, MD  ELDERBERRY PO Take 1 tablet by mouth daily as needed (immune support). Gummy    [provider]  fluconazole (DIFLUCAN) 150 MG tablet Take 1 tablet (150 mg total) by mouth once as needed for up to 2 doses (take one pill on day 1, and the second pill 3 days later if symptoms do not improve). Patient not taking: Reported on 08/20/2022 07/30/22   Rudolpho Sevin, NP  Insulin Pen Needle 32G X 4 MM MISC 1 Device by Does not apply route as directed. 03/26/20   Shamleffer, Melanie Crazier, MD  magnesium oxide (MAG-OX) 400 MG tablet Take 1 tablet (400 mg total) by mouth daily. Patient not taking: Reported on 08/20/2022 01/03/20   Donato Heinz, MD     Objective    Physical Exam: Vitals:   08/20/22 2200 08/20/22 2215 08/20/22 2230 08/20/22 2245  BP: (!) 172/97  (!) 142/106   Pulse: (!) 110 (!) 113 (!) 109 (!) 108  Resp: (!) 0 '14 13 13  '$ Temp:      TempSrc:      SpO2: 90% 95% 96% 97%  Weight:      Height:        General: appears to be stated age; alert, oriented Skin: warm, dry, erythema associated with the anterior aspect of the right lower extremity, just distal to the right knee and associated increased warmth, tenderness, swelling, but in the absence of any associated drainage.  Head:  AT/[place] Mouth:  Oral mucosa membranes appear moist, normal dentition Neck: supple; trachea midline Heart:  RRR; did not appreciate any M/R/G Lungs: CTAB, did not appreciate any wheezes, rales, or rhonchi Abdomen: + BS; soft, ND, NT Vascular: 2+ pedal pulses b/l; 2+ radial pulses b/l Extremities: Right lower extremity erythema, tenderness, increased warmth, swelling, as further noted above no muscle wasting Neuro: strength and sensation intact in upper and lower extremities b/l    Labs on Admission: I have personally reviewed following labs and imaging studies  CBC: Recent Labs  Lab 08/17/22 1758 08/20/22 2015  WBC 11.0* 14.6*  NEUTROABS 7.0 10.2*  HGB 12.0  12.0  HCT 39.4 38.8  MCV 87.9 86.6  PLT 299 [id number]   Basic Metabolic Panel: Recent Labs  Lab 08/17/22 1758 08/20/22 2015  NA 138 137  K 3.4* 4.6  CL 101 98  CO2 26 27  GLUCOSE 115* 121*  BUN 19 22  CREATININE 0.73 0.85  CALCIUM 8.9 8.9   GFR: Estimated Creatinine Clearance: 78 mL/min (by C-G formula based on SCr of 0.85 mg/dL). Liver Function Tests: Recent Labs  Lab 08/20/22 2015  AST 24  ALT 13  ALKPHOS 67  BILITOT 1.0  PROT 7.7  ALBUMIN 3.2*   No results for input(s): "LIPASE", "AMYLASE" in the last 168 hours. No results for input(s): "AMMONIA" in the last 168 hours. Coagulation Profile: No results for input(s): "INR", "PROTIME" in the last 168 hours. Cardiac Enzymes: No results for input(s): "CKTOTAL", "CKMB", "CKMBINDEX", "TROPONINI" in the last 168 hours. BNP (last 3 results) No results for input(s): "PROBNP" in the last 8760 hours. HbA1C: No results for input(s): "HGBA1C" in the last 72 hours. CBG: Recent Labs  Lab 08/20/22 2011  GLUCAP 140*   Lipid Profile: No results for input(s): "CHOL", "HDL", "LDLCALC", "TRIG", "CHOLHDL", "LDLDIRECT" in the last 72 hours. Thyroid Function Tests: No results for input(s): "TSH", "T4TOTAL", "FREET4", "T3FREE", "THYROIDAB" in the last 72 hours. Anemia Panel: No results for input(s): "VITAMINB12", "FOLATE", "FERRITIN", "TIBC", "IRON", "RETICCTPCT" in the last 72 hours. Urine analysis:    Component Value Date/Time   COLORURINE YELLOW 08/06/2022 0122   APPEARANCEUR HAZY (A) 08/06/2022 0122   LABSPEC 1.023 08/06/2022 0122   PHURINE 6.0 08/06/2022 0122   GLUCOSEU >=500 (A) 08/06/2022 0122   HGBUR NEGATIVE 08/06/2022 0122   BILIRUBINUR NEGATIVE 08/06/2022 0122   KETONESUR 20 (A) 08/06/2022 0122   PROTEINUR >=300 (A) 08/06/2022 0122   UROBILINOGEN 0.2 07/30/2022 1152   NITRITE NEGATIVE 08/06/2022 0122   LEUKOCYTESUR NEGATIVE 08/06/2022 0122    Radiological Exams on Admission: No results  found.    Assessment/Plan    Principal Problem:   Cellulitis of right lower extremity Active Problems:   Chronic diastolic CHF (congestive heart failure) ([place])   Essential hypertension   Hyperlipidemia   DM2 (diabetes mellitus, type 2) ([place])   Sepsis ([place])   Fall at home, initial encounter     #) Sepsis due to cellulitis of right lower extremity: dx on basis of 2 days of progressive right lower extremity erythema associated with tenderness, increased warmth to the touch, swelling.  Denies any associated purulent discharge.  No evidence of crepitus on exam suggest necrotizing fasciitis.  Plain films of the affected area of the right lower extremity showed no evidence of acute osseous abnormality, including no evidence of subcutaneous gas, without any interval falls to increase in neck suspicion for interval fracture. Of note, associated extremity is found to be neurovascularly intact.  In terms of risk factors predisposing to development of right lower extremity cellulitis, suspect that the abrasion incurred on the anterior aspect of the right lower extremity via, mechanical fall 08/17/2022 served as a portal of entry for such  SIRS criteria met via interval worsening of leukocytosis with neutrophil predominance, in addition to the presence of tachycardia. Of note, in the absence of any evidence of end organ damage, including a non-elevated presenting LA of 1.4, pt's sepsis does not meet criteria to be considered severe in nature. Also, in the absence of LA level greater than or equal to 4.0, and in the absence of any associated hypotension refractory to IVF's, there are no indications for administration of a 30 mL/kg IVF bolus at this time.   presentation meets criteria to be considered of severe cellulitis due to associated sepsis. Overall, in the setting of presenting severe non-purulent cellulitis, will associated recommendation from antibiotic stewardship committee for initiation of IV  vancomycin and cefepime.  As the patient is already received Rocephin, will continue this antibiotic for now, while adding IV vancomycin, with close ensuing clinical monitoring to determine need for escalation of cephalosporin from 3rd to 4th generation.  Of note, blood cultures  x 2 were collected prior to initiation of Rocephin.  No other source of underlying fracture identified at this time, but will also check urinalysis to further evaluate.  No evidence of acute respiratory symptoms to warrant pursuit of chest x-ray at this time.   Plan: Abx in form of IV vancomycin and Rocephin, as above. Follow for results of blood cultures x2 collected today. repeat CBC with diff in AM. I have placed nursing communication orders asking that current distribution of erythema be outlined, and that affected extremity be elevated to help decrease associated swelling.  Prn IV fentanyl.  Check urinalysis.             #) Ground level mechanical fall: it appears that the patient experienced a ground level fall on 08/17/2022 that appears to have been purely mechanical in nature, with patient conveying that they tripped while attempting ambulate at home without any preceding or resultant loss of consciousness. Not a/w any presyncope or chest pain. Did not hit head as a component of this fall. Blood thinners as outpatient: Daily baby aspirin.  As consequence of this fall, the patient was seated with the low EEG on 08/17/2022, which plain films of the right lower extremity showed no evidence of acute osseous abnormality, as further detailed above.  Right lower extremity remains neurovascular intact, but with interval development if right lower extremity cellulitis is consequence of portal of entry posed by abrasion incurred at the time of aforementioned ground-level mechanical fall.  While this fall appears to be purely mechanical in nature, will evaluate for underlying factors that may have predisposed patient to  experience such a fall, including further evaluation for any any additional underlying infectious contribution, including checking of updated urinalysis.   Plan: Fall precautions. Repeat CMP and CBC with differential in the morning.  PT consult ordered. Check UA.                   #) Chronic diastolic heart failure: documented history of such, with most recent echocardiogram performed in March 2023, which was notable for LVEF [id number], grade 2 diastolic dysfunction, with additional details as conveyed above. No clinical evidence to suggest acutely decompensated heart failure at this time. home diuretic regimen reportedly consists of the following: Torsemide 60 mg p.o. daily as well as eplerenone.  She appears relatively euvolemic at this time, and will reevaluate volume status in the morning for determination of timing of resumption of home diuretic medications.  Plan: monitor strict I's & O's and daily weights. Repeat CMP in AM. Check serum mag level.  Plan to reevaluate volume status in the morning to assist with determination of timing of resumption of home diuretic medications.              #) Type 2 Diabetes Mellitus: documented history of such. Home insulin regimen: Tresiba 80 units SQ every morning, in addition to NovoLog 20 units 3 times daily with meals. Home oral hypoglycemic agents: Metformin. presenting blood sugar: 121. Most recent A1c noted to be 10.3% when checked in September 2023.  Will closely monitor ensuing blood sugars, as below, given increased risk for relative hyperglycemia given physiologic stress associated with current presentation that involves the above infectious presentation.  in terms of initial dose of basal insulin to be started during this hospitalization, will resume approximately half of outpatient dose in order to reduce risk for ensuing hypoglycemia.   Plan: accuchecks QAC and HS with moderate dose SSI. hold home oral hypoglycemic agents during  this hospitalization.  Lantus 40 units SQ every morning, as above.              #) Essential Hypertension: documented h/o such, with outpatient antihypertensive regimen including losartan, Coreg, clonidine, torsemide, Flomax.  SBP's in the ED today: 120s to 170s mmHg. in the context of her septic presentation, will be conservative with resumption of home antihypertensive medications, as well below.  Plan: Close monitoring of subsequent BP via routine VS. resume home losartan, Coreg.  Hold him tamsulosin for now.  Reevaluation of volume status in the morning to assist with determination of timing of resumption of home diuretic medications.  Monitor strict I's and O's and daily weights.              #) Hyperlipidemia: documented h/o such. On atorvastatin as well as Zetia as outpatient.   Plan: continue home statin and Zetia.      DVT prophylaxis: Lovenox 40 units SQ daily Code Status: Full code Family Communication: none Disposition Plan: Per Rounding Team Consults called: none;  Admission status: Inpatient    I SPENT GREATER THAN 75  MINUTES IN CLINICAL CARE TIME/MEDICAL DECISION-MAKING IN COMPLETING THIS ADMISSION.     [place] DO [facility] [place]   08/20/2022, 11:22 PM

## 2022-08-20 NOTE — Progress Notes (Signed)
Pharmacy Antibiotic Note  Whitney Sloan is a 67 y.o. female admitted on 08/20/2022. Was just here a few days ago for weakness and leg swelling. She is back today because she has been in the same position since EMS dropped her off, she has become incontinent, and showing signs of failure to thrive, she has not been taking her medications.  Pharmacy consulted to dose vancomycin for cellulitis  Plan: Vancomycin 2gm IV x 1 then '1500mg'$  q24h (AUC 534.5, Scr 0.85) Follow renal function and clinical course  Height: '5\' 7"'$  (170.2 cm) Weight: 99.8 kg (220 lb) IBW/kg (Calculated) : 61.6  Temp (24hrs), Avg:98.9 F (37.2 C), Min:98.9 F (37.2 C), Max:98.9 F (37.2 C)  Recent Labs  Lab 08/17/22 1758 08/20/22 2015  WBC 11.0* 14.6*  CREATININE 0.73 0.85  LATICACIDVEN  --  1.4    Estimated Creatinine Clearance: 78 mL/min (by C-G formula based on SCr of 0.85 mg/dL).    Allergies  Allergen Reactions   Oxycodone-Acetaminophen Nausea And Vomiting    Antimicrobials this admission: 3/10 vanc >> 3/9 CTX >>  Dose adjustments this admission:   Microbiology results: 3/9 BCx:   Thank you for allowing pharmacy to be a part of this patient's care.  Dolly Rias RPh 08/20/2022, 11:03 PM

## 2022-08-20 NOTE — ED Notes (Signed)
Attempted to call lab x2 to see why the patients labs are not running, no answer at this time.

## 2022-08-20 NOTE — ED Provider Notes (Signed)
[place] EMERGENCY DEPARTMENT AT [facility] Provider Note   CSN: [id number] Arrival date & time: 08/20/22  1946     History  Chief Complaint  Patient presents with   Leg Swelling    Whitney Sloan is a 67 y.o. female.  67 year old female with past medical history of diabetes, hypertension, hyperlipidemia, chronic CHF brought in by EMS from home where she lives with her daughter.  Patient reports fall 1 week ago, seen in the ER at that time for pain in her knee after falling off of the commode.  Patient has returned home where she has a walker but has not been using it, uses her cane however states that she cannot walk very well, has not been taking her medications.  Reports worsening pain and swelling with redness and warmth in the right lower leg.  Denies fevers, chills, nausea, vomiting.  No other complaints or concerns.       Home Medications Prior to Admission medications   Medication Sig Start Date End Date Taking? Authorizing Provider  acetaminophen (TYLENOL) 500 MG tablet Take 500 mg by mouth every 6 (six) hours as needed for moderate pain or headache.    [provider]  aspirin (ASPIR-LOW) 81 MG EC tablet Take 81 mg by mouth every morning.    [provider]  atorvastatin (LIPITOR) 20 MG tablet Take 1 tablet (20 mg total) by mouth every evening. 08/30/21   Shamleffer, Melanie Crazier, MD  B-D UF III MINI PEN NEEDLES 31G X 5 MM MISC SMARTSIG:injection 3 Times Daily 01/18/22   [provider]  carvedilol (COREG) 25 MG tablet Take 1 tablet (25 mg total) by mouth 2 (two) times daily. 12/18/20 03/02/22  Florencia Reasons, MD  Chlorphen-Phenyleph-ASA (ALKA-SELTZER PLUS COLD PO) Take 2 tablets by mouth every 6 (six) hours as needed (cold symptoms).    [provider]  Continuous Blood Gluc Sensor (DEXCOM G7 SENSOR) MISC 1 Device by Does not apply route as directed. 03/02/22   Shamleffer, Melanie Crazier, MD  dapagliflozin propanediol (FARXIGA) 10  MG TABS tablet Take 1 tablet (10 mg total) by mouth daily before breakfast. 03/02/22   Shamleffer, Melanie Crazier, MD  Diclofenac Sodium 3 % GEL Apply 2 g topically 2 (two) times daily as needed. Patient taking differently: Apply 2 g topically 2 (two) times daily as needed (pain). 04/10/20   Dickie La, MD  ELDERBERRY PO Take 1 tablet by mouth daily as needed (immune support). Gummy    [provider]  eplerenone (INSPRA) 25 MG tablet Take 25 mg by mouth daily. 12/23/21   [provider]  ezetimibe (ZETIA) 10 MG tablet Take 10 mg by mouth at bedtime. 08/21/19   [provider]  fluconazole (DIFLUCAN) 150 MG tablet Take 1 tablet (150 mg total) by mouth once as needed for up to 2 doses (take one pill on day 1, and the second pill 3 days later if symptoms do not improve). 07/30/22   Rudolpho Sevin, NP  furosemide (LASIX) 40 MG tablet Take 1 tablet (40 mg total) by mouth daily for 5 days. 08/17/22 08/22/22  Roemhildt, Lorin T, PA-C  insulin degludec (TRESIBA FLEXTOUCH) 200 UNIT/ML FlexTouch Pen Inject 80 Units into the skin daily in the afternoon. 03/02/22   Shamleffer, Melanie Crazier, MD  Insulin Pen Needle 32G X 4 MM MISC 1 Device by Does not apply route as directed. 03/26/20   Shamleffer, Melanie Crazier, MD  losartan (COZAAR) 100 MG tablet Take 1  tablet (100 mg total) by mouth daily. 08/30/21   Shamleffer, Melanie Crazier, MD  magnesium oxide (MAG-OX) 400 MG tablet Take 1 tablet (400 mg total) by mouth daily. 01/03/20   Donato Heinz, MD  metFORMIN (GLUCOPHAGE) 500 MG tablet Take 1 tablet (500 mg total) by mouth daily with breakfast. 03/02/22   Shamleffer, Melanie Crazier, MD  NOVOLOG FLEXPEN 100 UNIT/ML FlexPen ADMINISTER 18 UNITS UNDER THE SKIN THREE TIMES DAILY WITH MEALS Patient taking differently: 20 Units 3 (three) times daily with meals. 02/08/22   Shamleffer, Melanie Crazier, MD  polyethylene glycol (MIRALAX / GLYCOLAX) 17 g packet Take 17 g by mouth daily as  needed for mild constipation.    [provider]  potassium chloride SA (KLOR-CON) 20 MEQ tablet Take 1 tablet (20 mEq total) by mouth daily. Patient not taking: Reported on 08/30/2021 12/18/20 03/18/21  Florencia Reasons, MD  Sennosides (EX-LAX) 15 MG TABS Take 15 mg by mouth daily as needed (constipation).    [provider]  torsemide (DEMADEX) 20 MG tablet Take 60 mg by mouth daily. 12/24/21   [provider]      Allergies    Oxycodone-acetaminophen    Review of Systems   Review of Systems Negative except as per HPI Physical Exam Updated Vital Signs BP (!) 201/102   Pulse (!) 105   Temp 98.9 F (37.2 C) (Oral)   Resp 12   Ht '5\' 7"'$  (1.702 m)   Wt 99.8 kg   SpO2 90%   BMI 34.46 kg/m  Physical Exam Vitals and nursing note reviewed.  Constitutional:      General: She is not in acute distress.    Appearance: She is well-developed. She is not diaphoretic.  HENT:     Head: Normocephalic and atraumatic.  Cardiovascular:     Rate and Rhythm: Regular rhythm. Tachycardia present.     Pulses: Normal pulses.     Heart sounds: Normal heart sounds.  Pulmonary:     Effort: Pulmonary effort is normal.     Breath sounds: Normal breath sounds.  Abdominal:     Palpations: Abdomen is soft.     Tenderness: There is no abdominal tenderness.  Musculoskeletal:        General: Swelling and tenderness present.     Right knee: Effusion present. No erythema. Decreased range of motion. Tenderness present.     Left knee: No effusion or erythema. Normal range of motion. No tenderness.     Right lower leg: Swelling present.     Comments: Erythema, swelling, TTP right lower leg, DP pulse present  Skin:    General: Skin is warm and dry.     Findings: Erythema present.  Neurological:     Mental Status: She is alert and oriented to person, place, and time.     Sensory: No sensory deficit.     Motor: No weakness.  Psychiatric:        Behavior: Behavior normal.     ED Results /  Procedures / Treatments   Labs (all labs ordered are listed, but only abnormal results are displayed) Labs Reviewed  CBC WITH DIFFERENTIAL/PLATELET - Abnormal; Notable for the following components:      Result Value   WBC 14.6 (*)    Neutro Abs 10.2 (*)    Monocytes Absolute 1.4 (*)    Abs Immature Granulocytes 0.08 (*)    All other components within normal limits  COMPREHENSIVE METABOLIC PANEL - Abnormal; Notable for the following components:  Glucose, Bld 121 (*)    Albumin 3.2 (*)    All other components within normal limits  CBG MONITORING, ED - Abnormal; Notable for the following components:   Glucose-Capillary 140 (*)    All other components within normal limits  CULTURE, BLOOD (SINGLE)  CULTURE, BLOOD (SINGLE)  LACTIC ACID, PLASMA    EKG None  Radiology No results found.  Procedures Procedures    Medications Ordered in ED Medications  cefTRIAXone (ROCEPHIN) 1 g in sodium chloride 0.9 % 100 mL IVPB (has no administration in time range)  carvedilol (COREG) tablet 25 mg (has no administration in time range)    ED Course/ Medical Decision Making/ A&P                             Medical Decision Making Amount and/or Complexity of Data Reviewed Labs: ordered.  Risk Prescription drug management.   This patient presents to the ED for concern of right lower leg pain, swelling, redness, this involves an extensive number of treatment options, and is a complaint that carries with it a high risk of complications and morbidity.  The differential diagnosis includes but not limited to cellulitis, traumatic effusion from fall last week, DVT   Co morbidities that complicate the patient evaluation  Diabetes, hypertension, hyperlipidemia, chronic CHF   Additional history obtained:  External records from outside source obtained and reviewed including DVT study right lower extremity from 08/17/22 ER visit negative for DVT   Lab Tests:  I Ordered, and personally  interpreted labs.  The pertinent results include: CBC with leukocytosis white count 14.6 with increase in neutrophils.  Glucose is 140.  Lactic acid reassuring at 1.4.   Consultations Obtained:  I requested consultation with the hospitalist,  and discussed lab and imaging findings as well as pertinent plan - they recommend: Consult pending at time of signout Discussed case with ER attending, Dr. Melina Copa, agrees with plan of care.   Problem List / ED Course / Critical interventions / Medication management  67 year old female presents with concern for ongoing pain in her right leg with swelling and now with redness.  DVT study during ER visit on 08/17/2022 negative for DVT.  Patient is provided with Rocephin.  CBC with mildly elevated WBC.  Lactic acid reassuring.  CMP without second findings. I ordered medication including Rocephin, Coreg for cellulitis, hypertension Reevaluation of the patient after these medicines showed that the patient stayed the same I have reviewed the patients home medicines and have made adjustments as needed   Social Determinants of Health:  Lives with family   Test / Admission - Considered:  Admit for further management         Final Clinical Impression(s) / ED Diagnoses Final diagnoses:  Cellulitis of right lower extremity    Rx / DC Orders ED Discharge Orders     None         Tacy Learn, PA-C 08/20/22 2220    Hayden Rasmussen, MD 08/21/22 [id number]

## 2022-08-20 NOTE — ED Notes (Signed)
ED TO INPATIENT HANDOFF REPORT  ED Nurse Name and Phone #: Alroy Bailiff Name/Age/Gender Whitney Sloan 67 y.o. female Room/Bed: WA10/WA10  Code Status   Code Status: Prior  Home/SNF/Other Home Patient oriented to: self, place, time, and situation Is this baseline? Yes   Triage Complete: Triage complete  Chief Complaint Cellulitis of right lower extremity [id number]  Triage Note Was just here a few days ago for weakness and leg swelling. She is back today because she has been in the same position since EMS dropped her off, she has become incontinent, and showing signs of failure to thrive, she has not been taking her medications      Right leg is swollen and hot to the touch, but both legs are swollen, family wants her placed because she is unable to take care of herself at home.   [id number]   Allergies Allergies  Allergen Reactions   Oxycodone-Acetaminophen Nausea And Vomiting    Level of Care/Admitting Diagnosis ED Disposition     ED Disposition  Admit   Condition  --   Comment  Hospital Area: [facility] [[id number]]  Level of Care: Progressive [102]  Admit to Progressive based on following criteria: MULTISYSTEM THREATS such as stable sepsis, metabolic/electrolyte imbalance with or without encephalopathy that is responding to early treatment.  May admit patient to [facility] or [facility] if equivalent level of care is available:: No  Covid Evaluation: Asymptomatic - no recent exposure (last 10 days) testing not required  Diagnosis: Cellulitis of right lower extremity [id number]  Admitting Physician: Rhetta Mura [id number]  Attending Physician: Rhetta Mura [id number]  Certification:: I certify this patient will need inpatient services for at least 2 midnights  Estimated Length of Stay: 2          B Medical/Surgery History Past Medical History:  Diagnosis Date   Abnormal stress test 12/31/2019   medium defect of moderate  severity present in the basal inferior, mid inferior and apex location.  Findings consistent with ischemia   Chronic diastolic (congestive) heart failure (HCC)    Diabetes mellitus    Hyperlipemia    Hypertension    Psoriasis    Past Surgical History:  Procedure Laterality Date   ANAL FISSURE REPAIR     ENDOMETRIAL ABLATION       A IV Location/Drains/Wounds Patient Lines/Drains/Airways Status     Active Line/Drains/Airways     Name Placement date Placement time Site Days   Peripheral IV 08/20/22 18 G Left Antecubital 08/20/22  2039  Antecubital  less than 1   Pressure Injury 08/31/21 Buttocks Right;Left;Mid Stage 2 -  Partial thickness loss of dermis presenting as a shallow open injury with a red, pink wound bed without slough. Moisture associated damage- redness multi small opean redden areas 08/31/21  1355  -- 354            Intake/Output Last 24 hours  Intake/Output Summary (Last 24 hours) at 08/20/2022 2252 Last data filed at 08/20/2022 2251 Gross per 24 hour  Intake 100 ml  Output --  Net 100 ml    Labs/Imaging Results for orders placed or performed during the hospital encounter of 08/20/22 (from the past 48 hour(s))  CBG monitoring, ED     Status: Abnormal   Collection Time: 08/20/22  8:11 PM  Result Value Ref Range   Glucose-Capillary 140 (H) 70 - 99 mg/dL    Comment: Glucose reference range applies only to samples taken after fasting for  at least 8 hours.  CBC with Differential     Status: Abnormal   Collection Time: 08/20/22  8:15 PM  Result Value Ref Range   WBC 14.6 (H) 4.0 - 10.5 K/uL   RBC 4.48 3.87 - 5.11 MIL/uL   Hemoglobin 12.0 12.0 - 15.0 g/dL   HCT 38.8 36.0 - 46.0 %   MCV 86.6 80.0 - 100.0 fL   MCH 26.8 26.0 - 34.0 pg   MCHC 30.9 30.0 - 36.0 g/dL   RDW 13.5 11.5 - 15.5 %   Platelets 286 150 - 400 K/uL   nRBC 0.0 0.0 - 0.2 %   Neutrophils Relative % 70 %   Neutro Abs 10.2 (H) 1.7 - 7.7 K/uL   Lymphocytes Relative 19 %   Lymphs Abs 2.8 0.7 -  4.0 K/uL   Monocytes Relative 10 %   Monocytes Absolute 1.4 (H) 0.1 - 1.0 K/uL   Eosinophils Relative 0 %   Eosinophils Absolute 0.0 0.0 - 0.5 K/uL   Basophils Relative 0 %   Basophils Absolute 0.0 0.0 - 0.1 K/uL   Immature Granulocytes 1 %   Abs Immature Granulocytes 0.08 (H) 0.00 - 0.07 K/uL    Comment: Performed at [facility], [place] [address] [place], [place] [address]  Comprehensive metabolic panel     Status: Abnormal   Collection Time: 08/20/22  8:15 PM  Result Value Ref Range   Sodium 137 135 - 145 mmol/L   Potassium 4.6 3.5 - 5.1 mmol/L    Comment: HEMOLYSIS AT THIS LEVEL MAY AFFECT RESULT   Chloride 98 98 - 111 mmol/L   CO2 27 22 - 32 mmol/L   Glucose, Bld 121 (H) 70 - 99 mg/dL    Comment: Glucose reference range applies only to samples taken after fasting for at least 8 hours.   BUN 22 8 - 23 mg/dL   Creatinine, Ser 0.85 0.44 - 1.00 mg/dL   Calcium 8.9 8.9 - 10.3 mg/dL   Total Protein 7.7 6.5 - 8.1 g/dL   Albumin 3.2 (L) 3.5 - 5.0 g/dL   AST 24 15 - 41 U/L    Comment: HEMOLYSIS AT THIS LEVEL MAY AFFECT RESULT   ALT 13 0 - 44 U/L    Comment: HEMOLYSIS AT THIS LEVEL MAY AFFECT RESULT   Alkaline Phosphatase 67 38 - 126 U/L   Total Bilirubin 1.0 0.3 - 1.2 mg/dL    Comment: HEMOLYSIS AT THIS LEVEL MAY AFFECT RESULT   GFR, Estimated >60 >60 mL/min    Comment: (NOTE) Calculated using the CKD-EPI Creatinine Equation (2021)    Anion gap 12 5 - 15    Comment: Performed at [facility], [place] [address], [place], [place] [address]  Lactic acid, plasma     Status: None   Collection Time: 08/20/22  8:15 PM  Result Value Ref Range   Lactic Acid, Venous 1.4 0.5 - 1.9 mmol/L    Comment: Performed at [facility], [place] [address] [place], [place] [address]   No results found.  Pending Labs Unresulted Labs (From admission, onward)     Start     Ordered   08/20/22 2112  Culture, blood (single)  Once,   STAT         08/20/22 2111   08/20/22 2018  Culture, blood (single)  Once,   STAT        08/20/22 2018  Vitals/Pain Today's Vitals   08/20/22 2200 08/20/22 2215 08/20/22 2230 08/20/22 2245  BP: (!) 172/97  (!) 142/106   Pulse: (!) 110 (!) 113 (!) 109 (!) 108  Resp: (!) 0 '14 13 13  '$ Temp:      TempSrc:      SpO2: 90% 95% 96% 97%  Weight:      Height:      PainSc:        Isolation Precautions No active isolations  Medications Medications  carvedilol (COREG) tablet 25 mg (25 mg Oral Total Dose 08/21/22 0800)  cefTRIAXone (ROCEPHIN) 1 g in sodium chloride 0.9 % 100 mL IVPB (0 g Intravenous Stopped 08/20/22 2251)    Mobility non-ambulatory     Focused Assessments    R Recommendations: See Admitting Provider Note  Report given to:   Additional Notes:

## 2022-08-20 NOTE — ED Triage Notes (Signed)
Was just here a few days ago for weakness and leg swelling. She is back today because she has been in the same position since EMS dropped her off, she has become incontinent, and showing signs of failure to thrive, she has not been taking her medications      Right leg is swollen and hot to the touch, but both legs are swollen, family wants her placed because she is unable to take care of herself at home.   Cbg-146

## 2022-08-20 NOTE — ED Notes (Signed)
Called 4th floor for a purple man. Message sent to the nurse.

## 2022-08-21 ENCOUNTER — Inpatient Hospital Stay (HOSPITAL_COMMUNITY): Payer: 59

## 2022-08-21 DIAGNOSIS — A419 Sepsis, unspecified organism: Secondary | ICD-10-CM | POA: Diagnosis present

## 2022-08-21 DIAGNOSIS — L03115 Cellulitis of right lower limb: Secondary | ICD-10-CM | POA: Diagnosis not present

## 2022-08-21 DIAGNOSIS — W19XXXA Unspecified fall, initial encounter: Secondary | ICD-10-CM

## 2022-08-21 LAB — URINALYSIS, COMPLETE (UACMP) WITH MICROSCOPIC
Bilirubin Urine: NEGATIVE
Glucose, UA: 50 mg/dL — AB
Ketones, ur: NEGATIVE mg/dL
Nitrite: NEGATIVE
Protein, ur: >=300 mg/dL — AB
Specific Gravity, Urine: 1.019 (ref 1.005–1.030)
pH: 5 (ref 5.0–8.0)

## 2022-08-21 LAB — COMPREHENSIVE METABOLIC PANEL
ALT: 9 U/L (ref 0–44)
AST: 14 U/L — ABNORMAL LOW (ref 15–41)
Albumin: 3.2 g/dL — ABNORMAL LOW (ref 3.5–5.0)
Alkaline Phosphatase: 71 U/L (ref 38–126)
Anion gap: 11 (ref 5–15)
BUN: 20 mg/dL (ref 8–23)
CO2: 29 mmol/L (ref 22–32)
Calcium: 9 mg/dL (ref 8.9–10.3)
Chloride: 98 mmol/L (ref 98–111)
Creatinine, Ser: 0.83 mg/dL (ref 0.44–1.00)
GFR, Estimated: >60 mL/min (ref >60)
Glucose, Bld: 137 mg/dL — ABNORMAL HIGH (ref 70–99)
Potassium: 4.4 mmol/L (ref 3.5–5.1)
Sodium: 138 mmol/L (ref 135–145)
Total Bilirubin: 0.5 mg/dL (ref 0.3–1.2)
Total Protein: 7.9 g/dL (ref 6.5–8.1)

## 2022-08-21 LAB — BLOOD GAS, ARTERIAL
Acid-Base Excess: 8.7 mmol/L — ABNORMAL HIGH (ref 0.0–2.0)
Bicarbonate: 32.8 mmol/L — ABNORMAL HIGH (ref 20.0–28.0)
Drawn by: 441371
FIO2: 21 %
O2 Saturation: 96.1 %
Patient temperature: 37.1
pCO2 arterial: 42 mmHg (ref 32–48)
pH, Arterial: 7.5 — ABNORMAL HIGH (ref 7.35–7.45)
pO2, Arterial: 72 mmHg — ABNORMAL LOW (ref 83–108)

## 2022-08-21 LAB — GLUCOSE, CAPILLARY
Glucose-Capillary: 250 mg/dL — ABNORMAL HIGH (ref 70–99)
Glucose-Capillary: 308 mg/dL — ABNORMAL HIGH (ref 70–99)
Glucose-Capillary: 197 mg/dL — ABNORMAL HIGH (ref 70–99)
Glucose-Capillary: 205 mg/dL — ABNORMAL HIGH (ref 70–99)

## 2022-08-21 LAB — CBC WITH DIFFERENTIAL/PLATELET
Abs Immature Granulocytes: 0.05 10*3/uL (ref 0.00–0.07)
Basophils Absolute: 0 10*3/uL (ref 0.0–0.1)
Basophils Relative: 0 %
Eosinophils Absolute: 0 10*3/uL (ref 0.0–0.5)
Eosinophils Relative: 0 %
HCT: 38.9 % (ref 36.0–46.0)
Hemoglobin: 12.2 g/dL (ref 12.0–15.0)
Immature Granulocytes: 0 %
Lymphocytes Relative: 21 %
Lymphs Abs: 2.9 10*3/uL (ref 0.7–4.0)
MCH: 27 pg (ref 26.0–34.0)
MCHC: 31.4 g/dL (ref 30.0–36.0)
MCV: 86.1 fL (ref 80.0–100.0)
Monocytes Absolute: 1.4 10*3/uL — ABNORMAL HIGH (ref 0.1–1.0)
Monocytes Relative: 11 %
Neutro Abs: 9.4 10*3/uL — ABNORMAL HIGH (ref 1.7–7.7)
Neutrophils Relative %: 68 %
Platelets: 302 10*3/uL (ref 150–400)
RBC: 4.52 MIL/uL (ref 3.87–5.11)
RDW: 13.4 % (ref 11.5–15.5)
WBC: 13.8 10*3/uL — ABNORMAL HIGH (ref 4.0–10.5)
nRBC: 0 % (ref 0.0–0.2)

## 2022-08-21 LAB — URIC ACID: Uric Acid, Serum: 8.5 mg/dL — ABNORMAL HIGH (ref 2.5–7.1)

## 2022-08-21 LAB — TSH: TSH: 0.874 u[IU]/mL (ref 0.350–4.500)

## 2022-08-21 LAB — VITAMIN B12: Vitamin B-12: 153 pg/mL — ABNORMAL LOW (ref 180–914)

## 2022-08-21 LAB — MAGNESIUM: Magnesium: 1.9 mg/dL (ref 1.7–2.4)

## 2022-08-21 MED ORDER — ENOXAPARIN SODIUM 40 MG/0.4ML IJ SOSY
40.0000 mg | PREFILLED_SYRINGE | Freq: Every day | INTRAMUSCULAR | Status: DC
  Administered 2022-08-22 – 2022-08-29 (×8): 40 mg via SUBCUTANEOUS
  Filled 2022-08-21 (×8): qty 0.4

## 2022-08-21 MED ORDER — KETOROLAC TROMETHAMINE 15 MG/ML IJ SOLN
15.0000 mg | Freq: Three times a day (TID) | INTRAMUSCULAR | Status: AC | PRN
  Filled 2022-08-21: qty 1

## 2022-08-21 MED ORDER — COLCHICINE 0.6 MG PO TABS
0.6000 mg | ORAL_TABLET | Freq: Every day | ORAL | Status: DC
  Administered 2022-08-21 – 2022-08-29 (×9): 0.6 mg via ORAL
  Filled 2022-08-21 (×9): qty 1

## 2022-08-21 MED ORDER — VANCOMYCIN HCL 1500 MG/300ML IV SOLN
1500.0000 mg | INTRAVENOUS | Status: DC
  Administered 2022-08-21: 1500 mg via INTRAVENOUS
  Filled 2022-08-21: qty 300

## 2022-08-21 MED ORDER — INSULIN GLARGINE-YFGN 100 UNIT/ML ~~LOC~~ SOLN
60.0000 [IU] | Freq: Every day | SUBCUTANEOUS | Status: DC
  Administered 2022-08-22 – 2022-08-23 (×2): 60 [IU] via SUBCUTANEOUS
  Filled 2022-08-21 (×2): qty 0.6

## 2022-08-21 MED ORDER — DAPAGLIFLOZIN PROPANEDIOL 10 MG PO TABS
10.0000 mg | ORAL_TABLET | Freq: Every day | ORAL | Status: DC
  Administered 2022-08-22 – 2022-08-29 (×8): 10 mg via ORAL
  Filled 2022-08-21 (×8): qty 1

## 2022-08-21 MED ORDER — HYDROMORPHONE HCL 1 MG/ML IJ SOLN
0.5000 mg | INTRAMUSCULAR | Status: DC | PRN
  Filled 2022-08-21: qty 0.5

## 2022-08-21 MED ORDER — ALLOPURINOL 100 MG PO TABS
100.0000 mg | ORAL_TABLET | Freq: Every day | ORAL | Status: DC
  Administered 2022-08-21 – 2022-08-29 (×9): 100 mg via ORAL
  Filled 2022-08-21 (×9): qty 1

## 2022-08-21 MED ORDER — INSULIN ASPART 100 UNIT/ML IJ SOLN
12.0000 [IU] | Freq: Three times a day (TID) | INTRAMUSCULAR | Status: DC
  Administered 2022-08-21 – 2022-08-23 (×6): 12 [IU] via SUBCUTANEOUS

## 2022-08-21 MED ORDER — CYANOCOBALAMIN 1000 MCG/ML IJ SOLN
1000.0000 ug | Freq: Every day | INTRAMUSCULAR | Status: AC
  Administered 2022-08-21 – 2022-08-27 (×7): 1000 ug via INTRAMUSCULAR
  Filled 2022-08-21 (×7): qty 1

## 2022-08-21 MED ORDER — ENOXAPARIN SODIUM 40 MG/0.4ML IJ SOSY: 40.0000 mg | PREFILLED_SYRINGE | INTRAMUSCULAR | Status: DC

## 2022-08-21 MED ORDER — SODIUM CHLORIDE 0.9 % IV SOLN
2.0000 g | INTRAVENOUS | Status: DC
  Administered 2022-08-21 – 2022-08-22 (×2): 2 g via INTRAVENOUS
  Filled 2022-08-21 (×2): qty 20

## 2022-08-21 MED ORDER — SODIUM CHLORIDE 0.9 % IV SOLN: INTRAVENOUS | Status: DC

## 2022-08-21 MED ORDER — PREDNISONE 20 MG PO TABS
40.0000 mg | ORAL_TABLET | Freq: Every day | ORAL | Status: DC
  Administered 2022-08-21 – 2022-08-24 (×4): 40 mg via ORAL
  Filled 2022-08-21 (×4): qty 2

## 2022-08-21 NOTE — Evaluation (Signed)
Physical Therapy Evaluation Patient Details Name: Whitney Sloan MRN: [id number] DOB: Jul 09, 1955 Today's Date: 08/21/2022  History of Present Illness  67 yo female admitted with cellultis, sepsis, fall at home, c/o bil LE pain. (-) fx, (+) joint effusion, pseudogout? per ortho consult. Hx of DM, CHF, morbid obesity  Clinical Impression  On eval, pt required Mod-Max A +2 for mobility. She sat EOB for at least 8 minutes with varying level of A. She partially stood at bedside with A of 2 and STEDY. Pt presents with general weakness, decreased activity tolerance, and impaired gait and balance. NT in to help and get pt cleaned up/linens changed-bed soaked in urine. Discussed d/c plan-pt and family are agreeable to [facility].        Recommendations for follow up therapy are one component of a multi-disciplinary discharge planning process, led by the attending physician.  Recommendations may be updated based on patient status, additional functional criteria and insurance authorization.  Follow Up Recommendations Skilled nursing-short term rehab (<3 hours/day) Can patient physically be transported by private vehicle: No    Assistance Recommended at Discharge Frequent or constant Supervision/Assistance  Patient can return home with the following  Two people to help with walking and/or transfers;Two people to help with bathing/dressing/bathroom    Equipment Recommendations  (TBD at next venue)  Recommendations for Other Services       Functional Status Assessment Patient has had a recent decline in their functional status and demonstrates the ability to make significant improvements in function in a reasonable and predictable amount of time.     Precautions / Restrictions Precautions Precautions: Fall Restrictions Weight Bearing Restrictions: No      Mobility  Bed Mobility Overal bed mobility: Needs Assistance Bed Mobility: Rolling, Supine to Sit, Sit to Sidelying Rolling: Mod assist    Supine to sit: Mod assist, HOB elevated   Sit to sidelying: Mod assist General bed mobility comments: Assist for trunk and bil LEs. Utilized bedpad for scooting, positioning. Increased time. Poor sitting balance    Transfers Overall transfer level: Needs assistance   Transfers: Sit to/from Stand Sit to Stand: +2 physical assistance, +2 safety/equipment, Max assist           General transfer comment: Assist to power up to partial standing with use of STEDY. Max cues and increased time. Poor ability to weigthbear on LEs without heavy UE assist (pt supporting herself with forearms on parts of STEDY). Unable to fully stand.    Ambulation/Gait                  Stairs            Wheelchair Mobility    Modified Rankin (Stroke Patients Only)       Balance Overall balance assessment: Needs assistance, History of Falls Sitting-balance support: Feet supported, Bilateral upper extremity supported Sitting balance-Leahy Scale: Poor       Standing balance-Leahy Scale: Zero                               Pertinent Vitals/Pain Pain Assessment Pain Assessment: Faces Faces Pain Scale: Hurts whole lot Pain Location: bil LEs, especially R knee Pain Descriptors / Indicators: Grimacing, Guarding Pain Intervention(s): Limited activity within patient's tolerance, Monitored during session, Repositioned    Home Living Family/patient expects to be discharged to:: Skilled nursing facility Living Arrangements: Children Available Help at Discharge: Family;Available PRN/intermittently Type of Home: Apartment Home Access:  Level entry       Home Layout: One level Home Equipment: Conservation officer, nature (2 wheels);BSC/3in1;Tub bench      Prior Function Prior Level of Function : Needs assist             Mobility Comments: was using RW at baseline until most recently began having trouble ambulating ADLs Comments: assist required     Hand Dominance         Extremity/Trunk Assessment   Upper Extremity Assessment Upper Extremity Assessment: Defer to OT evaluation    Lower Extremity Assessment Lower Extremity Assessment: Generalized weakness    Cervical / Trunk Assessment Cervical / Trunk Assessment: Normal  Communication   Communication: No difficulties  Cognition Arousal/Alertness: Awake/alert Behavior During Therapy: Flat affect   Area of Impairment: Problem solving, Safety/judgement, Following commands                       Following Commands: Follows one step commands with increased time     Problem Solving: Slow processing, Requires verbal cues, Difficulty sequencing, Decreased initiation General Comments: increased time to respond to questions asked of her        General Comments      Exercises     Assessment/Plan    PT Assessment Patient needs continued PT services  PT Problem List Decreased strength;Decreased balance;Decreased mobility;Decreased activity tolerance;Pain;Decreased knowledge of use of DME;Obesity;Decreased cognition       PT Treatment Interventions DME instruction;Gait training;Functional mobility training;Therapeutic activities;Balance training;Patient/family education;Therapeutic exercise    PT Goals (Current goals can be found in the Care Plan section)  Acute Rehab PT Goals Patient Stated Goal: less pain PT Goal Formulation: With patient/family Time For Goal Achievement: 09/04/22 Potential to Achieve Goals: Good    Frequency Min 2X/week     Co-evaluation               AM-PAC PT "6 Clicks" Mobility  Outcome Measure Help needed turning from your back to your side while in a flat bed without using bedrails?: A Lot Help needed moving from lying on your back to sitting on the side of a flat bed without using bedrails?: A Lot Help needed moving to and from a bed to a chair (including a wheelchair)?: Total Help needed standing up from a chair using your arms (e.g., wheelchair  or bedside chair)?: Total Help needed to walk in hospital room?: Total Help needed climbing 3-5 steps with a railing? : Total 6 Click Score: 8    End of Session   Activity Tolerance: Patient limited by pain;Patient limited by fatigue Patient left: in bed;with call bell/phone within reach;with nursing/sitter in room   PT Visit Diagnosis: Muscle weakness (generalized) (M62.81);History of falling (Z91.81);Pain;Difficulty in walking, not elsewhere classified (R26.2)    Time: [id number] PT Time Calculation (min) (ACUTE ONLY): 28 min   Charges:   PT Evaluation $PT Eval Moderate Complexity: 1 Mod PT Treatments $Therapeutic Activity: 8-22 mins         Doreatha Massed, PT Acute Rehabilitation  Office: [phone]

## 2022-08-21 NOTE — Plan of Care (Signed)

## 2022-08-21 NOTE — Progress Notes (Signed)
Went to get pt for Stat MRI brain that was ordered. Pt was sitting up in bed eating lunch and did not want to come to MRI at this time. Will try to fit pt back into MRI schedule later.

## 2022-08-21 NOTE — Consult Note (Signed)
ORTHOPAEDIC CONSULTATION  REQUESTING PHYSICIAN: Elmarie Shiley, MD  Chief Complaint: Right lower extremity cellulitis, bilateral knee pain  HPI: Whitney Sloan is a 67 y.o. female who presents with a history of diabetes presents to the [facility] emergency room 9 March with concern for right lower extremity cellulitis.  This has been subsequently treated inpatient and she states that the redness is decreasing.  She states that she is still having left worse than right knee pain and she did initially have a fall onto the right side.  X-rays were obtained in the emergency room and found to be negative.  She states that she is overall having pain and swelling in the knee although she is able to move them without excruciating pain.  Past Medical History:  Diagnosis Date   Abnormal stress test 12/31/2019   medium defect of moderate severity present in the basal inferior, mid inferior and apex location.  Findings consistent with ischemia   Chronic diastolic (congestive) heart failure (HCC)    Diabetes mellitus    Hyperlipemia    Hypertension    Psoriasis    Past Surgical History:  Procedure Laterality Date   ANAL FISSURE REPAIR     ENDOMETRIAL ABLATION     Social History   Socioeconomic History   Marital status: Divorced    Spouse name: Not on file   Number of children: Not on file   Years of education: Not on file   Highest education level: Not on file  Occupational History   Occupation: disabled  Tobacco Use   Smoking status: Never    Passive exposure: Past   Smokeless tobacco: Never  Vaping Use   Vaping Use: Never used  Substance and Sexual Activity   Alcohol use: No   Drug use: No   Sexual activity: Not Currently  Other Topics Concern   Not on file  Social History Narrative   Not on file   Social Determinants of Health   Financial Resource Strain: Not on file  Food Insecurity: No Food Insecurity (08/21/2022)   Hunger Vital Sign    Worried About Running Out  of Food in the Last Year: Never true    Ran Out of Food in the Last Year: Never true  Transportation Needs: No Transportation Needs (08/21/2022)   PRAPARE - Hydrologist (Medical): No    Lack of Transportation (Non-Medical): No  Physical Activity: Not on file  Stress: Not on file  Social Connections: Not on file   Family History  Problem Relation Age of Onset   Dementia Mother    Renal Disease Father    Diabetes Other    - negative except otherwise stated in the family history section Allergies  Allergen Reactions   Oxycodone-Acetaminophen Nausea And Vomiting   Prior to Admission medications   Medication Sig Start Date End Date Taking? Authorizing Provider  aspirin (ASPIR-LOW) 81 MG EC tablet Take 81 mg by mouth every morning.   Yes [provider]  atorvastatin (LIPITOR) 20 MG tablet Take 1 tablet (20 mg total) by mouth every evening. 08/30/21  Yes Shamleffer, Melanie Crazier, MD  carvedilol (COREG) 25 MG tablet Take 1 tablet (25 mg total) by mouth 2 (two) times daily. 12/18/20 08/20/22 Yes Florencia Reasons, MD  Chlorphen-Phenyleph-ASA (ALKA-SELTZER PLUS COLD PO) Take 2 tablets by mouth every 6 (six) hours as needed (cold symptoms).   Yes [provider]  dapagliflozin propanediol (FARXIGA) 10 MG TABS tablet Take 1 tablet (  10 mg total) by mouth daily before breakfast. 03/02/22  Yes Shamleffer, Melanie Crazier, MD  eplerenone (INSPRA) 25 MG tablet Take 25 mg by mouth daily. 12/23/21  Yes [provider]  ezetimibe (ZETIA) 10 MG tablet Take 10 mg by mouth at bedtime. 08/21/19  Yes [provider]  furosemide (LASIX) 40 MG tablet Take 1 tablet (40 mg total) by mouth daily for 5 days. 08/17/22 08/22/22 Yes Roemhildt, Lorin T, PA-C  insulin degludec (TRESIBA FLEXTOUCH) 200 UNIT/ML FlexTouch Pen Inject 80 Units into the skin daily in the afternoon. 03/02/22  Yes Shamleffer, Melanie Crazier, MD  losartan (COZAAR) 100 MG tablet Take 1 tablet (100 mg  total) by mouth daily. 08/30/21  Yes Shamleffer, Melanie Crazier, MD  metFORMIN (GLUCOPHAGE) 500 MG tablet Take 1 tablet (500 mg total) by mouth daily with breakfast. 03/02/22  Yes Shamleffer, Melanie Crazier, MD  NOVOLOG FLEXPEN 100 UNIT/ML FlexPen ADMINISTER 18 UNITS UNDER THE SKIN THREE TIMES DAILY WITH MEALS Patient taking differently: 20 Units 3 (three) times daily with meals. 02/08/22  Yes Shamleffer, Melanie Crazier, MD  polyethylene glycol (MIRALAX / GLYCOLAX) 17 g packet Take 17 g by mouth daily as needed for mild constipation.   Yes [provider]  potassium chloride SA (KLOR-CON) 20 MEQ tablet Take 1 tablet (20 mEq total) by mouth daily. 12/18/20 08/20/22 Yes Florencia Reasons, MD  Sennosides (EX-LAX) 15 MG TABS Take 15 mg by mouth daily as needed (constipation).   Yes [provider]  tamsulosin (FLOMAX) 0.4 MG CAPS capsule Take 0.4 mg by mouth daily. 07/08/22  Yes [provider]  torsemide (DEMADEX) 20 MG tablet Take 60 mg by mouth daily. 12/24/21  Yes [provider]  VITAMIN D, CHOLECALCIFEROL, PO Take 1 capsule by mouth daily.   Yes [provider]  acetaminophen (TYLENOL) 500 MG tablet Take 500 mg by mouth every 6 (six) hours as needed for moderate pain or headache.    [provider]  B-D UF III MINI PEN NEEDLES 31G X 5 MM MISC SMARTSIG:injection 3 Times Daily 01/18/22   [provider]  Continuous Blood Gluc Sensor (DEXCOM G7 SENSOR) MISC 1 Device by Does not apply route as directed. 03/02/22   Shamleffer, Melanie Crazier, MD  Diclofenac Sodium 3 % GEL Apply 2 g topically 2 (two) times daily as needed. Patient taking differently: Apply 2 g topically 2 (two) times daily as needed (pain). 04/10/20   Dickie La, MD  ELDERBERRY PO Take 1 tablet by mouth daily as needed (immune support). Gummy    [provider]  fluconazole (DIFLUCAN) 150 MG tablet Take 1 tablet (150 mg total) by mouth once as needed for up to 2 doses (take one  pill on day 1, and the second pill 3 days later if symptoms do not improve). Patient not taking: Reported on 08/20/2022 07/30/22   Rudolpho Sevin, NP  Insulin Pen Needle 32G X 4 MM MISC 1 Device by Does not apply route as directed. 03/26/20   Shamleffer, Melanie Crazier, MD  magnesium oxide (MAG-OX) 400 MG tablet Take 1 tablet (400 mg total) by mouth daily. Patient not taking: Reported on 08/20/2022 01/03/20   Donato Heinz, MD   DG Knee 1-2 Views Left  Result Date: 08/21/2022 CLINICAL DATA:  LEFT knee pain. EXAM: LEFT KNEE - 2 VIEW COMPARISON:  12/12/2021 and prior radiographs FINDINGS: There is no evidence of acute fracture or dislocation. Medial and patellofemoral compartment joint space narrowing again noted. A small knee  effusion is present. No focal bony lesions are identified. IMPRESSION: 1. No acute bony abnormality. 2. Small knee effusion. 3. Medial and patellofemoral compartment degenerative changes. Electronically Signed   By: Margarette Canada M.D.   On: 08/21/2022 11:49     Positive ROS: All other systems have been reviewed and were otherwise negative with the exception of those mentioned in the HPI and as above.  Physical Exam: General: No acute distress Cardiovascular: No pedal edema Respiratory: No cyanosis, no use of accessory musculature GI: No organomegaly, abdomen is soft and non-tender Skin: No lesions in the area of chief complaint Neurologic: Sensation intact distally Psychiatric: Patient is at baseline mood and affect Lymphatic: No axillary or cervical lymphadenopathy  MUSCULOSKELETAL:  Range of motion bilateral knees is from 0 to 30 degrees without significant pain.  There is joint line tenderness bilaterally.  There is an effusion bilaterally.  There is redness and swelling about the right lower extremity  Independent Imaging Review: X-rays 2 views right knee, left knee: No evidence of fracture in either knee.  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  I have described that falls and/or periods of cellulitis and inflammation in the body can spur flareups of inflammatory arthritis.  Her x-ray findings do show evidence of chondrocalcinosis which may be consistent with pseudogout.  To that effect I believe treatment with an anti-inflammatory or ideally a steroid if she is able to tolerate would get her knee is feeling better and allow for mobilization.  Plan: No orthopedic intervention required  Thank you for the consult and the opportunity to see Ms. Pincus Sanes, MD [facility] 11:52 AM

## 2022-08-21 NOTE — Progress Notes (Signed)
PROGRESS NOTE    Whitney Sloan  [id number] DOB: 1956/06/01 DOA: 08/20/2022 PCP: Nolene Ebbs, MD   Brief Narrative: 67 year old with past medical history significant for chronic diastolic heart failure, diabetes type 2, hypertension, hyperlipidemia presented with sepsis due to right lower extremity cellulitis.  Of note patient recently presented to [facility] on 08/17/2022 after a ground-level mechanical fall after which she sustained abrasion of the anterior aspect of the right lower extremity.  X-ray at that time was negative for fracture.  She was subsequently discharged home.  She returned the day of admission complaining of developing redness, increased warmth and swelling at the abrasion site.  Patient admitted for right lower extremity cellulitis.  Assessment & Plan:   Principal Problem:   Cellulitis of right lower extremity Active Problems:   Chronic diastolic CHF (congestive heart failure) ([place])   Essential hypertension   Hyperlipidemia   DM2 (diabetes mellitus, type 2) ([place])   Sepsis ([place])   Fall at home, initial encounter  1-Sepsis secondary to right lower extremity cellulitis: Right LE cellulitis  -Presented with worsening lower extremity redness, edema, \ -Continue IV antibiotics -Follow-up blood cultures   2-Ground-level Mechanical Fall: -PT consulted.   3-Bilateral Knee pain, worse right; Suspect gout, pseudogout.  Ortho consulted.  No need to do  CT knee.  Treat with steroids/  Start colchicine and allopurinol.  X ray left knee negative. X ray right knee negative for fracture.   Urinary incontinence for over a year.  Discussed with Daughter, patient will need to be evaluated out patient by Urology.  Tx for UTI, UA with 21-50 WBC.  Check bladder scan post void.   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  Worsening left LE weakness for last 3 days per family.  Plan to proceed with MRI.  Could be also  worsening stroke symptoms due to pain and acute illness.   Fecal incontinence.  Could be related to inability to ambulate independent.  Needs follow up with GI out patient.    Chronic Diastolic HF -Holding Diuretics\in setting sepsis.  -Resume tomorrow if BP stable.    Diabetes Type 2: Hyperglycemia, uncontrolled.  Increase Semglee to 60 units.  Will add 12 units Meal coverage.  Resume Farxiga.    HTN; Continue with Carvedilol.   Hyperlipidemia:  Continue with Zetia and lipitor.   Acute metabolic encephalopathy; Per daughter patient has been more lethargic, sleep at home.  She wonder if she has sleep apnea. I advised patient needs to follow up with PCP for sleep study.  -check B 12, TSH, Ammonia, ABG  Acute hypoxia; placed on 2 L oxygen. Check chest x ray  B 12 Deficiency; 153. Start B 12 supplement.         Estimated body mass index is 35.05 kg/m as calculated from the following:   Height as of this encounter: '5\' 7"'$  (1.702 m).   Weight as of this encounter: 101.5 kg.   DVT prophylaxis: Lovenox Code Status: Full code Family Communication: daughter over phone and bedside.  Disposition Plan:  Status is: Inpatient Remains inpatient appropriate because: management LE cellulitis.     Consultants:  ortho  Procedures:    Antimicrobials:    Subjective: She is sleepy, fall sleep in between conversation. Report starting to have now left knee pain, very tender on palpation. Still having right knee pain.  Per daughter patient with left LE weakness for last several day. Also with chronic urinary and bowel  incontinence.   Objective: Vitals:   08/20/22 2245 08/20/22 2342 08/21/22 0500 08/21/22 0522  BP:  (!) 159/78  (!) 164/77  Pulse: (!) 108 (!) 102  (!) 104  Resp: '13 16  17  '$ Temp:  99.4 F (37.4 C)  98.6 F (37 C)  TempSrc:  Oral  Oral  SpO2: 97% 95%  94%  Weight:   101.5 kg   Height:        Intake/Output Summary (Last 24 hours) at 08/21/2022  0734 Last data filed at 08/21/2022 [id number] Gross per 24 hour  Intake 532.47 ml  Output 460 ml  Net 72.47 ml   Filed Weights   08/20/22 1957 08/21/22 0500  Weight: 99.8 kg 101.5 kg    Examination:  General exam: Appears calm and comfortable  Respiratory system: Clear to auscultation. Respiratory effort normal. Cardiovascular system: S1 & S2 heard, RRR. Gastrointestinal system: Abdomen is nondistended, soft and nontender. No organomegaly or masses felt. Normal bowel sounds heard. Central nervous system: Alert and oriented. Left side weakness, able to moves arm. Unable to move left LE.  Extremities: Right LE with mild redness, no significant abrasion notice.    Data Reviewed: I have personally reviewed following labs and imaging studies  CBC: Recent Labs  Lab 08/17/22 1758 08/20/22 2015 08/21/22 0018  WBC 11.0* 14.6* 13.8*  NEUTROABS 7.0 10.2* 9.4*  HGB 12.0 12.0 12.2  HCT 39.4 38.8 38.9  MCV 87.9 86.6 86.1  PLT 299 286 [id number]   Basic Metabolic Panel: Recent Labs  Lab 08/17/22 1758 08/20/22 2015 08/21/22 0017 08/21/22 0018  NA 138 137  --  138  K 3.4* 4.6  --  4.4  CL 101 98  --  98  CO2 26 27  --  29  GLUCOSE 115* 121*  --  137*  BUN 19 22  --  20  CREATININE 0.73 0.85  --  0.83  CALCIUM 8.9 8.9  --  9.0  MG  --   --  1.9  --    GFR: Estimated Creatinine Clearance: 80.6 mL/min (by C-G formula based on SCr of 0.83 mg/dL). Liver Function Tests: Recent Labs  Lab 08/20/22 2015 08/21/22 0018  AST 24 14*  ALT 13 9  ALKPHOS 67 71  BILITOT 1.0 0.5  PROT 7.7 7.9  ALBUMIN 3.2* 3.2*   No results for input(s): "LIPASE", "AMYLASE" in the last 168 hours. No results for input(s): "AMMONIA" in the last 168 hours. Coagulation Profile: No results for input(s): "INR", "PROTIME" in the last 168 hours. Cardiac Enzymes: No results for input(s): "CKTOTAL", "CKMB", "CKMBINDEX", "TROPONINI" in the last 168 hours. BNP (last 3 results) No results for input(s): "PROBNP" in the  last 8760 hours. HbA1C: No results for input(s): "HGBA1C" in the last 72 hours. CBG: Recent Labs  Lab 08/20/22 2011  GLUCAP 140*   Lipid Profile: No results for input(s): "CHOL", "HDL", "LDLCALC", "TRIG", "CHOLHDL", "LDLDIRECT" in the last 72 hours. Thyroid Function Tests: No results for input(s): "TSH", "T4TOTAL", "FREET4", "T3FREE", "THYROIDAB" in the last 72 hours. Anemia Panel: No results for input(s): "VITAMINB12", "FOLATE", "FERRITIN", "TIBC", "IRON", "RETICCTPCT" in the last 72 hours. Sepsis Labs: Recent Labs  Lab 08/20/22 2015  LATICACIDVEN 1.4    No results found for this or any previous visit (from the past 240 hour(s)).       Radiology Studies: No results found.      Scheduled Meds:  aspirin EC  81 mg Oral BH-q7a   atorvastatin  20 mg Oral  QPM   carvedilol  25 mg Oral BID WC   enoxaparin (LOVENOX) injection  40 mg Subcutaneous Q24H   ezetimibe  10 mg Oral QHS   insulin aspart  0-15 Units Subcutaneous TID WC   insulin glargine-yfgn  40 Units Subcutaneous Daily   losartan  100 mg Oral Daily   Continuous Infusions:  cefTRIAXone (ROCEPHIN)  IV     vancomycin       LOS: 1 day    Time spent: 35 minutes    Azarah Dacy A Dmetrius Ambs, MD [facility]   If 7PM-7AM, please contact night-coverage [email or web address]  08/21/2022, 7:34 AM

## 2022-08-22 DIAGNOSIS — L03115 Cellulitis of right lower limb: Secondary | ICD-10-CM | POA: Diagnosis not present

## 2022-08-22 LAB — URINE CULTURE: Culture: NO GROWTH

## 2022-08-22 LAB — CBC
HCT: 32.9 % — ABNORMAL LOW (ref 36.0–46.0)
Hemoglobin: 10.4 g/dL — ABNORMAL LOW (ref 12.0–15.0)
MCH: 26.9 pg (ref 26.0–34.0)
MCHC: 31.6 g/dL (ref 30.0–36.0)
MCV: 85.2 fL (ref 80.0–100.0)
Platelets: 279 10*3/uL (ref 150–400)
RBC: 3.86 MIL/uL — ABNORMAL LOW (ref 3.87–5.11)
RDW: 13.5 % (ref 11.5–15.5)
WBC: 12.6 10*3/uL — ABNORMAL HIGH (ref 4.0–10.5)
nRBC: 0 % (ref 0.0–0.2)

## 2022-08-22 LAB — GLUCOSE, CAPILLARY
Glucose-Capillary: 294 mg/dL — ABNORMAL HIGH (ref 70–99)
Glucose-Capillary: 267 mg/dL — ABNORMAL HIGH (ref 70–99)
Glucose-Capillary: 205 mg/dL — ABNORMAL HIGH (ref 70–99)
Glucose-Capillary: 219 mg/dL — ABNORMAL HIGH (ref 70–99)

## 2022-08-22 LAB — MAGNESIUM: Magnesium: 2.1 mg/dL (ref 1.7–2.4)

## 2022-08-22 LAB — BASIC METABOLIC PANEL
Anion gap: 10 (ref 5–15)
BUN: 30 mg/dL — ABNORMAL HIGH (ref 8–23)
CO2: 26 mmol/L (ref 22–32)
Calcium: 8.4 mg/dL — ABNORMAL LOW (ref 8.9–10.3)
Chloride: 97 mmol/L — ABNORMAL LOW (ref 98–111)
Creatinine, Ser: 1 mg/dL (ref 0.44–1.00)
GFR, Estimated: >60 mL/min (ref >60)
Glucose, Bld: 337 mg/dL — ABNORMAL HIGH (ref 70–99)
Potassium: 3.2 mmol/L — ABNORMAL LOW (ref 3.5–5.1)
Sodium: 133 mmol/L — ABNORMAL LOW (ref 135–145)

## 2022-08-22 LAB — AMMONIA: Ammonia: 26 umol/L (ref 9–35)

## 2022-08-22 LAB — HEMOGLOBIN A1C
Hgb A1c MFr Bld: 13.9 % — ABNORMAL HIGH (ref 4.8–5.6)
Mean Plasma Glucose: 352 mg/dL

## 2022-08-22 MED ORDER — POTASSIUM CHLORIDE CRYS ER 20 MEQ PO TBCR
40.0000 meq | EXTENDED_RELEASE_TABLET | Freq: Once | ORAL | Status: AC
  Administered 2022-08-22: 40 meq via ORAL
  Filled 2022-08-22: qty 2

## 2022-08-22 MED ORDER — VANCOMYCIN HCL 1250 MG/250ML IV SOLN
1250.0000 mg | INTRAVENOUS | Status: DC
  Administered 2022-08-22: 1250 mg via INTRAVENOUS
  Filled 2022-08-22: qty 250

## 2022-08-22 NOTE — Evaluation (Signed)
Occupational Therapy Evaluation Patient Details Name: Whitney Sloan MRN: [id number] DOB: [birth year] Today's Date: 08/22/2022   History of Present Illness 67 yo female admitted with cellultis, sepsis, fall at home, c/o bil LE pain. (-) fx, (+) joint effusion, pseudogout? per ortho consult. Hx of DM, CHF, morbid obesity   Clinical Impression   Patient is currently requiring assistance with ADLs including up to total assist with bed level Lower body ADLs, up to moderate assist with seated Upper body ADLs,  as well as  up to moderate assist +2 people with bed mobility and inability to perform a stand or functional transfers to toilet.  Current level of function is below patient's typical baseline, however pt and SIL report increasing difficulty with mobility and ADLs since a fall in February when pt slid off her commode.   During this evaluation, patient was limited by generalized weakness, impaired activity tolerance, knee pain, and mild cognitive deficits, all of which has the potential to impact patient's safety and independence during functional mobility, as well as performance for ADLs.  Patient lives in a first floor apartment with her daughater,  who is unable to provide much supervision and assistance due to daughter having health issues.  All other family members work outside the home.  Patient demonstrates good rehab potential, and should benefit from continued skilled occupational therapy services while in acute care to maximize safety, independence and quality of life at home.  Continued occupational therapy services in a SNF setting prior to return home is recommended.  ?    Recommendations for follow up therapy are one component of a multi-disciplinary discharge planning process, led by the attending physician.  Recommendations may be updated based on patient status, additional functional criteria and insurance authorization.   Follow Up Recommendations  Skilled nursing-short term rehab  (<3 hours/day)     Assistance Recommended at Discharge Frequent or constant Supervision/Assistance  Patient can return home with the following A lot of help with walking and/or transfers;Assistance with cooking/housework;A lot of help with bathing/dressing/bathroom;Two people to help with walking and/or transfers;Help with stairs or ramp for entrance;Assist for transportation;Direct supervision/assist for financial management;Direct supervision/assist for medications management    Functional Status Assessment  Patient has had a recent decline in their functional status and demonstrates the ability to make significant improvements in function in a reasonable and predictable amount of time.  Equipment Recommendations   (TBD)    Recommendations for Other Services       Precautions / Restrictions Precautions Precautions: Fall Restrictions Weight Bearing Restrictions: No      Mobility Bed Mobility Overal bed mobility: Needs Assistance Bed Mobility: Rolling, Supine to Sit, Sit to Sidelying Rolling: Mod assist   Supine to sit: Mod assist, HOB elevated   Sit to sidelying: Mod assist, +2 for physical assistance General bed mobility comments: Assist for trunk and bil LEs. Utilized bedpad for scooting, positioning. Increased time. Poor sitting balance. Unable to latearlly scoot along EOB.    Transfers                          Balance Overall balance assessment: Needs assistance, History of Falls Sitting-balance support: Feet supported, Bilateral upper extremity supported Sitting balance-Leahy Scale: Poor       Standing balance-Leahy Scale: Zero                             ADL either performed or  assessed with clinical judgement   ADL Overall ADL's : Needs assistance/impaired Eating/Feeding: Set up;Bed level   Grooming: Set up;Sitting   Upper Body Bathing: Minimal assistance;Sitting   Lower Body Bathing: Maximal assistance;Bed level   Upper Body  Dressing : Minimal assistance;Sitting   Lower Body Dressing: Total assistance;Bed level     Toilet Transfer Details (indicate cue type and reason): Pt declined to try a stand even with Stedy.  Stated that she is not having knee pain and does not want to exacerbate it. Toileting- Clothing Manipulation and Hygiene: Total assistance;Bed level Toileting - Clothing Manipulation Details (indicate cue type and reason): With pure wick. Bed and pads found to be urine saturated once pt EOB.  Nursing notified and RN getting fresh bed linens.     Functional mobility during ADLs: Moderate assistance;+2 for safety/equipment;Maximal assistance (Bed only)       Vision Baseline Vision/History: 1 Wears glasses (readers only) Vision Assessment?: No apparent visual deficits     Perception     Praxis      Pertinent Vitals/Pain Pain Assessment Pain Assessment: No/denies pain Pain Intervention(s): Monitored during session     Hand Dominance Right   Extremity/Trunk Assessment Upper Extremity Assessment Upper Extremity Assessment: Generalized weakness   Lower Extremity Assessment Lower Extremity Assessment: Generalized weakness   Cervical / Trunk Assessment Cervical / Trunk Assessment: Normal   Communication Communication Communication: No difficulties   Cognition Arousal/Alertness: Awake/alert Behavior During Therapy: Flat affect Overall Cognitive Status: Impaired/Different from baseline Area of Impairment: Problem solving, Safety/judgement, Following commands                       Following Commands: Follows one step commands with increased time     Problem Solving: Slow processing, Requires verbal cues, Difficulty sequencing, Decreased initiation General Comments: increased time to respond to questions asked of her. Pt aware of deficits and SIL in room and stated baseline is no confusion.     General Comments       Exercises     Shoulder Instructions      Home  Living Family/patient expects to be discharged to:: Skilled nursing facility Living Arrangements: Children Available Help at Discharge: Family;Available PRN/intermittently (Lives with younger daughter who works from home but cannot give 24/7 supervision and having some health problems.) Type of Home: Apartment Home Access: Level entry     Home Layout: One level               Home Equipment: Conservation officer, nature (2 wheels);BSC/3in1;Tub bench          Prior Functioning/Environment Prior Level of Function : History of Falls (last six months);Needs assist       Physical Assist : ADLs (physical)   ADLs (physical): IADLs Mobility Comments: Was using RW at baseline since falling off the toilet on Feb 23rd.  Reports Ind ambulation prior to that. ADLs Comments: Assist required with IADLs although pt reports that she often does it, but it's a struggle.        OT Problem List: Decreased strength;Pain;Decreased activity tolerance;Decreased cognition;Increased edema;Obesity;Decreased knowledge of use of DME or AE;Impaired balance (sitting and/or standing)      OT Treatment/Interventions: Self-care/ADL training;Therapeutic exercise;Therapeutic activities;Cognitive remediation/compensation;DME and/or AE instruction;Patient/family education;Balance training    OT Goals(Current goals can be found in the care plan section) Acute Rehab OT Goals OT Goal Formulation: Patient unable to participate in goal setting Time For Goal Achievement: 09/05/22 Potential to Achieve Goals: Good ADL Goals Pt  Will Perform Grooming: sitting;with set-up;with supervision (With at least fair sitting balance without UE support) Pt Will Perform Upper Body Dressing: with set-up;with supervision;sitting Pt Will Perform Lower Body Dressing: with min assist;sitting/lateral leans;sit to/from stand;with adaptive equipment Pt Will Transfer to Toilet: with min assist;stand pivot transfer Pt Will Perform Toileting - Clothing  Manipulation and hygiene: with min assist;sitting/lateral leans;sit to/from stand Pt/caregiver will Perform Home Exercise Program: Increased strength;Both right and left upper extremity;With Supervision  OT Frequency: Min 2X/week    Co-evaluation              AM-PAC OT "6 Clicks" Daily Activity     Outcome Measure Help from another person eating meals?: A Little Help from another person taking care of personal grooming?: A Little Help from another person toileting, which includes using toliet, bedpan, or urinal?: Total Help from another person bathing (including washing, rinsing, drying)?: A Lot Help from another person to put on and taking off regular upper body clothing?: A Lot Help from another person to put on and taking off regular lower body clothing?: Total 6 Click Score: 12   End of Session Nurse Communication: Other (comment) (Wet bed/bed pads)  Activity Tolerance: Patient tolerated treatment well Patient left: in bed;with call bell/phone within reach;with family/visitor present (RN at linen cart and entering room)  OT Visit Diagnosis: Unsteadiness on feet (R26.81);History of falling (Z91.81);Pain;Muscle weakness (generalized) (M62.81);Other symptoms and signs involving cognitive function Pain - Right/Left:  (Bil) Pain - part of body: Knee                Time: 1050-1115 OT Time Calculation (min): 25 min Charges:  OT General Charges $OT Visit: 1 Visit OT Evaluation $OT Eval Low Complexity: 1 Low OT Treatments $Therapeutic Activity: 8-22 mins  Anderson Malta, [place] Office: [phone] 08/22/2022  Julien Girt 08/22/2022, 11:33 AM

## 2022-08-22 NOTE — Progress Notes (Signed)
Chaplain engaged in an initial visit with Whitney Sloan. Chaplain offered education on the Advanced Directive, healthcare POA. Whitney Sloan's son-in-law was present during conversation. Whitney Sloan desires to assign her oldest daughter and son-in-law as her healthcare agents but needs some time to think over the document with her children. Chaplain let them know how to complete the document and how to reach Chaplain when needed for its completion.   Chaplain provided support, compassionate presence, and listening.   Chaplain Celestine Prim, MDiv  08/22/22 1200  Spiritual Encounters  Type of Visit Initial  Care provided to: Pt and family  Reason for visit Advance directives  Spiritual Framework  Presenting Themes Goals in life/care;Community and relationships  Community/Connection Family  Interventions  Spiritual Care Interventions Made Established relationship of care and support;Decision-making support/facilitation  Intervention Outcomes  Outcomes Awareness of support;Awareness around self/spiritual resourses;Connection to spiritual care  Spiritual Care Plan  Spiritual Care Issues Still Outstanding Whitney Sloan will continue to follow

## 2022-08-22 NOTE — Progress Notes (Signed)
PROGRESS NOTE    Whitney Sloan  [id number] DOB: 1955-07-18 DOA: 08/20/2022 PCP: Nolene Ebbs, MD   Brief Narrative: 67 year old with past medical history significant for chronic diastolic heart failure, diabetes type 2, hypertension, hyperlipidemia presented with sepsis due to right lower extremity cellulitis.  Of note patient recently presented to [facility] on 08/17/2022 after a ground-level mechanical fall after which she sustained abrasion of the anterior aspect of the right lower extremity.  X-ray at that time was negative for fracture.  She was subsequently discharged home.  She returned the day of admission complaining of developing redness, increased warmth and swelling at the abrasion site.  Patient admitted for right lower extremity cellulitis.  Assessment & Plan:   Principal Problem:   Cellulitis of right lower extremity Active Problems:   Chronic diastolic CHF (congestive heart failure) (HCC)   Essential hypertension   Hyperlipidemia   DM2 (diabetes mellitus, type 2) ([place])   Sepsis ([place])   Fall at home, initial encounter  1-Sepsis secondary to right lower extremity cellulitis: Right LE cellulitis  -Presented with worsening lower extremity redness, edema, \ -Continue IV antibiotics -Blood cultures: no growth to date.  -IV Vancomycin and Ceftriaxone.  Improving.   2-Ground-level Mechanical Fall: -PT consulted.  Might need rehab.   3-Bilateral Knee pain, worse right; Gout, Pseudo Gout.  Suspect gout, pseudogout.  Ortho consulted. Recommend Tx for gout /Pseudogout.  Treat with steroids/ day 2 prednisone/  Continue with colchicine and allopurinol.  X ray left knee negative. X ray right knee negative for fracture.  Pain improved, swelling improved.   Urinary incontinence for over a year.  Discussed with Daughter, patient will need to be evaluated out patient by Urology.  Tx for UTI, UA with 21-50 WBC.  She has been able to urinate  Follow Urine culture.    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  Worsening left LE weakness for last 3 days per family.  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  Weakness improved, she is able to move left leg better.   Fecal incontinence.  Could be related to inability to ambulate independent.  Needs follow up with GI out patient.    Chronic Diastolic HF -Holding Diuretics\in setting sepsis.  -RMonitor renal fx, hold diuretics for now.    Diabetes Type 2: Hyperglycemia, uncontrolled.  On  Semglee to 60 units.  Continue with 12 units Meal coverage.  Resume Farxiga.    HTN; Continue with Carvedilol.   Hyperlipidemia:  Continue with Zetia and lipitor.   Acute metabolic encephalopathy; Per daughter patient has been more lethargic, sleep at home.  She wonder if she has sleep apnea. I advised patient needs to follow up with PCP for sleep study.  - B 12 low, TSH normal , Ammonia normal.  ABG Oxygen low at 70  Acute hypoxia; placed on 2 L oxygen. Chest x ray negative for pulmonary edema.   B 12 Deficiency; 153. Started B 12 supplement.   Hypokalemia; replete orally.       Estimated body mass index is 34.77 kg/m as calculated from the following:   Height as of this encounter: '5\' 7"'$  (1.702 m).   Weight as of this encounter: 100.7 kg.   DVT prophylaxis: Lovenox Code Status: Full code Family Communication: Son at bedside.  Disposition Plan:  Status is: Inpatient Remains inpatient appropriate because: management LE cellulitis.  Consultants:  ortho  Procedures:    Antimicrobials:    Subjective: She is more awake, conversant,. Answer question.  She report improvement of knees pain. Able to move better left LE   Objective: Vitals:   08/22/22 0507 08/22/22 1121 08/22/22 1126 08/22/22 1339  BP: (!) 147/69   (!) 143/71  Pulse: 74  77 73  Resp: '20 11  18   '$ Temp: (!) 97.4 F (36.3 C)   98 F (36.7 C)  TempSrc: Oral   Oral  SpO2: 97%   95%  Weight:      Height:        Intake/Output Summary (Last 24 hours) at 08/22/2022 1343 Last data filed at 08/22/2022 1341 Gross per 24 hour  Intake 2663.36 ml  Output 1650 ml  Net 1013.36 ml    Filed Weights   08/20/22 1957 08/21/22 0500 08/22/22 0500  Weight: 99.8 kg 101.5 kg 100.7 kg    Examination:  General exam: NAD Respiratory system:  CTA Cardiovascular system: S 1, S 2 RRR Gastrointestinal system: BS present, soft, nt Central nervous system: alert, able to move better left leg.  Extremities: Right LE with mild redness, no significant abrasion notice.    Data Reviewed: I have personally reviewed following labs and imaging studies  CBC: Recent Labs  Lab 08/17/22 1758 08/20/22 2015 08/21/22 0018 08/22/22 0742  WBC 11.0* 14.6* 13.8* 12.6*  NEUTROABS 7.0 10.2* 9.4*  --   HGB 12.0 12.0 12.2 10.4*  HCT 39.4 38.8 38.9 32.9*  MCV 87.9 86.6 86.1 85.2  PLT 299 286 302 [id number]    Basic Metabolic Panel: Recent Labs  Lab 08/17/22 1758 08/20/22 2015 08/21/22 0017 08/21/22 0018 08/22/22 0742  NA 138 137  --  138 133*  K 3.4* 4.6  --  4.4 3.2*  CL 101 98  --  98 97*  CO2 26 27  --  29 26  GLUCOSE 115* 121*  --  137* 337*  BUN 19 22  --  20 30*  CREATININE 0.73 0.85  --  0.83 1.00  CALCIUM 8.9 8.9  --  9.0 8.4*  MG  --   --  1.9  --  2.1    GFR: Estimated Creatinine Clearance: 66.5 mL/min (by C-G formula based on SCr of 1 mg/dL). Liver Function Tests: Recent Labs  Lab 08/20/22 2015 08/21/22 0018  AST 24 14*  ALT 13 9  ALKPHOS 67 71  BILITOT 1.0 0.5  PROT 7.7 7.9  ALBUMIN 3.2* 3.2*    No results for input(s): "LIPASE", "AMYLASE" in the last 168 hours. Recent Labs  Lab 08/22/22 0047  AMMONIA 26   Coagulation Profile: No results for input(s): "INR", "PROTIME" in the last 168 hours. Cardiac Enzymes: No results for input(s): "CKTOTAL", "CKMB", "CKMBINDEX",  "TROPONINI" in the last 168 hours. BNP (last 3 results) No results for input(s): "PROBNP" in the last 8760 hours. HbA1C: Recent Labs    08/21/22 0017  HGBA1C 13.9*   CBG: Recent Labs  Lab 08/21/22 1122 08/21/22 1642 08/21/22 2058 08/22/22 0734 08/22/22 1144  GLUCAP 308* 197* 205* 294* 267*    Lipid Profile: No results for input(s): "CHOL", "HDL", "LDLCALC", "TRIG", "CHOLHDL", "LDLDIRECT" in the last 72 hours. Thyroid Function Tests: Recent Labs    08/21/22 1056  TSH 0.874   Anemia Panel: Recent Labs    08/21/22 1056  VITAMINB12 153*   Sepsis Labs: Recent Labs  Lab 08/20/22 2015  LATICACIDVEN 1.4     Recent Results (from the  past 240 hour(s))  Culture, blood (single)     Status: None (Preliminary result)   Collection Time: 08/20/22  8:15 PM   Specimen: BLOOD  Result Value Ref Range Status   Specimen Description   Final    BLOOD LEFT ANTECUBITAL Performed at [facility] [address] [place], [place] [address]    Special Requests   Final    BOTTLES DRAWN AEROBIC AND ANAEROBIC Blood Culture results may not be optimal due to an inadequate volume of blood received in culture bottles Performed at [facility] [address] [place], [place] [address]    Culture   Final    NO GROWTH < 12 HOURS Performed at [facility] [address] [place], [place] [address]    Report Status PENDING  Incomplete  Culture, blood (single)     Status: None (Preliminary result)   Collection Time: 08/20/22 10:16 PM   Specimen: BLOOD  Result Value Ref Range Status   Specimen Description   Final    BLOOD Performed at [facility] [address], [place], [place] [address]    Special Requests   Final    BOTTLES DRAWN AEROBIC AND ANAEROBIC Blood Culture results may not be optimal due to an inadequate volume of blood received in culture bottles Performed at [facility] [address] [place], [place] [address]    Culture   Final    NO GROWTH <12 HOURS Performed at [facility] [address], [place], [place] [address]    Report Status PENDING  Incomplete         Radiology Studies: MR BRAIN WO CONTRAST  Result Date: 08/21/2022 CLINICAL DATA:  Left-sided weakness and confusion EXAM: MRI HEAD WITHOUT CONTRAST TECHNIQUE: Multiplanar, multiecho pulse sequences of the brain and surrounding structures were obtained without intravenous contrast. COMPARISON:  03/29/2009 FINDINGS: Brain: No acute infarct, mass effect or extra-axial collection. No chronic microhemorrhage or siderosis. There is multifocal hyperintense T2-weighted signal within the white matter. Parenchymal volume and CSF spaces are normal. Old left basal ganglia small vessel infarct. Punctate chronic infarct of the right cerebellum. The midline structures are normal. Vascular: Normal flow voids. Skull and upper cervical spine: Normal marrow signal. Sinuses/Orbits: Negative. Other: None. IMPRESSION: 1. No acute intracranial abnormality. 2. Old left basal ganglia small vessel infarct and findings of chronic small vessel ischemia. Electronically Signed   By: Ulyses Jarred M.D.   On: 08/21/2022 19:52   DG Chest 1 View  Result Date: 08/21/2022 CLINICAL DATA:  Hypoxia. EXAM: CHEST  1 VIEW COMPARISON:  12/12/2021. FINDINGS: 1731 hours. Low lung volumes accentuate the pulmonary vasculature and cardiomediastinal silhouette. No focal airspace opacity. No pleural effusion or pneumothorax. Visualized bones and upper abdomen are unremarkable. IMPRESSION: No evidence of acute cardiopulmonary disease. Electronically Signed   By: Emmit Alexanders M.D.   On: 08/21/2022 17:46   DG Knee 1-2 Views Left  Result Date: 08/21/2022 CLINICAL DATA:  LEFT knee pain. EXAM: LEFT KNEE - 2 VIEW COMPARISON:  12/12/2021 and prior radiographs FINDINGS: There is no evidence of acute fracture or dislocation. Medial and patellofemoral compartment  joint space narrowing again noted. A small knee effusion is present. No focal bony lesions are identified. IMPRESSION: 1. No acute bony abnormality. 2. Small knee effusion. 3. Medial and patellofemoral compartment degenerative changes. Electronically Signed   By: Margarette Canada M.D.   On: 08/21/2022 11:49  Scheduled Meds:  allopurinol  100 mg Oral Daily   aspirin EC  81 mg Oral BH-q7a   atorvastatin  20 mg Oral QPM   carvedilol  25 mg Oral BID WC   colchicine  0.6 mg Oral Daily   cyanocobalamin  1,000 mcg Intramuscular Daily   dapagliflozin propanediol  10 mg Oral QAC breakfast   enoxaparin (LOVENOX) injection  40 mg Subcutaneous Daily   ezetimibe  10 mg Oral QHS   insulin aspart  0-15 Units Subcutaneous TID WC   insulin aspart  12 Units Subcutaneous TID WC   insulin glargine-yfgn  60 Units Subcutaneous Daily   losartan  100 mg Oral Daily   predniSONE  40 mg Oral Daily   Continuous Infusions:  cefTRIAXone (ROCEPHIN)  IV Stopped (08/21/22 2106)   vancomycin       LOS: 2 days    Time spent: 35 minutes    Sheniqua Carolan A Finnick Orosz, MD [facility]   If 7PM-7AM, please contact night-coverage [email or web address]  08/22/2022, 1:43 PM

## 2022-08-22 NOTE — Progress Notes (Signed)
Pharmacy Antibiotic Note  Whitney Sloan is a 67 y.o. female admitted on 08/20/2022. Was just here a few days ago for weakness and leg swelling. She is back today because she has been in the same position since EMS dropped her off, she has become incontinent, and showing signs of failure to thrive, she has not been taking her medications.  Pharmacy consulted to dose vancomycin for cellulitis  Day 2 total abx - Vanc/rocephin Tmax 100 WBC trending down SCr 0.85 >> 1  Plan: Due to rising SCr, will change vanc from '1500mg'$  IV q24 to '1250mg'$  IV q24 - goal AUC 400-550 Follow renal function and clinical course  Height: '5\' 7"'$  (170.2 cm) Weight: 100.7 kg (222 lb) IBW/kg (Calculated) : 61.6  Temp (24hrs), Avg:98.5 F (36.9 C), Min:97.4 F (36.3 C), Max:100 F (37.8 C)  Recent Labs  Lab 08/17/22 1758 08/20/22 2015 08/21/22 0018 08/22/22 0742  WBC 11.0* 14.6* 13.8* 12.6*  CREATININE 0.73 0.85 0.83 1.00  LATICACIDVEN  --  1.4  --   --      Estimated Creatinine Clearance: 66.5 mL/min (by C-G formula based on SCr of 1 mg/dL).    Allergies  Allergen Reactions   Oxycodone-Acetaminophen Nausea And Vomiting    Antimicrobials this admission: 3/10 vanc >> 3/9 CTX >>  Dose adjustments this admission:   Microbiology results: 3/9 BCx: ngtd  Thank you for allowing pharmacy to be a part of this patient's care.  Adrian Saran, PharmD, BCPS Secure Chat if ?s 08/22/2022 9:56 AM

## 2022-08-22 NOTE — Progress Notes (Signed)
Post void bladder scan done with result of approximately 380 ml of urine. Patient voided 400 ml after scan.

## 2022-08-23 DIAGNOSIS — L03115 Cellulitis of right lower limb: Secondary | ICD-10-CM | POA: Diagnosis not present

## 2022-08-23 LAB — CBC
HCT: 33 % — ABNORMAL LOW (ref 36.0–46.0)
Hemoglobin: 10.4 g/dL — ABNORMAL LOW (ref 12.0–15.0)
MCH: 26.7 pg (ref 26.0–34.0)
MCHC: 31.5 g/dL (ref 30.0–36.0)
MCV: 84.8 fL (ref 80.0–100.0)
Platelets: 294 10*3/uL (ref 150–400)
RBC: 3.89 MIL/uL (ref 3.87–5.11)
RDW: 13.4 % (ref 11.5–15.5)
WBC: 15.4 10*3/uL — ABNORMAL HIGH (ref 4.0–10.5)
nRBC: 0 % (ref 0.0–0.2)

## 2022-08-23 LAB — BASIC METABOLIC PANEL
Anion gap: 12 (ref 5–15)
BUN: 37 mg/dL — ABNORMAL HIGH (ref 8–23)
CO2: 25 mmol/L (ref 22–32)
Calcium: 8.8 mg/dL — ABNORMAL LOW (ref 8.9–10.3)
Chloride: 99 mmol/L (ref 98–111)
Creatinine, Ser: 0.94 mg/dL (ref 0.44–1.00)
GFR, Estimated: >60 mL/min (ref >60)
Glucose, Bld: 271 mg/dL — ABNORMAL HIGH (ref 70–99)
Potassium: 3.5 mmol/L (ref 3.5–5.1)
Sodium: 136 mmol/L (ref 135–145)

## 2022-08-23 LAB — GLUCOSE, CAPILLARY
Glucose-Capillary: 184 mg/dL — ABNORMAL HIGH (ref 70–99)
Glucose-Capillary: 131 mg/dL — ABNORMAL HIGH (ref 70–99)
Glucose-Capillary: 167 mg/dL — ABNORMAL HIGH (ref 70–99)
Glucose-Capillary: 126 mg/dL — ABNORMAL HIGH (ref 70–99)
Glucose-Capillary: 189 mg/dL — ABNORMAL HIGH (ref 70–99)

## 2022-08-23 LAB — URINE CULTURE: Culture: NO GROWTH

## 2022-08-23 LAB — MAGNESIUM: Magnesium: 2.3 mg/dL (ref 1.7–2.4)

## 2022-08-23 MED ORDER — INSULIN GLARGINE-YFGN 100 UNIT/ML ~~LOC~~ SOLN
64.0000 [IU] | Freq: Every day | SUBCUTANEOUS | Status: DC
  Administered 2022-08-24: 64 [IU] via SUBCUTANEOUS
  Filled 2022-08-23 (×2): qty 0.64

## 2022-08-23 MED ORDER — CEFAZOLIN SODIUM-DEXTROSE 2-4 GM/100ML-% IV SOLN
2.0000 g | Freq: Three times a day (TID) | INTRAVENOUS | Status: DC
  Administered 2022-08-23 – 2022-08-27 (×11): 2 g via INTRAVENOUS
  Filled 2022-08-23 (×11): qty 100

## 2022-08-23 MED ORDER — INSULIN ASPART 100 UNIT/ML IJ SOLN
14.0000 [IU] | Freq: Three times a day (TID) | INTRAMUSCULAR | Status: DC
  Administered 2022-08-23: 14 [IU] via SUBCUTANEOUS

## 2022-08-23 MED ORDER — POTASSIUM CHLORIDE CRYS ER 20 MEQ PO TBCR
20.0000 meq | EXTENDED_RELEASE_TABLET | Freq: Every day | ORAL | Status: DC
  Administered 2022-08-23 – 2022-08-27 (×5): 20 meq via ORAL
  Filled 2022-08-23 (×5): qty 1

## 2022-08-23 MED ORDER — FUROSEMIDE 20 MG PO TABS
20.0000 mg | ORAL_TABLET | Freq: Every day | ORAL | Status: DC
  Administered 2022-08-23 – 2022-08-29 (×7): 20 mg via ORAL
  Filled 2022-08-23 (×7): qty 1

## 2022-08-23 NOTE — Progress Notes (Addendum)
PROGRESS NOTE    Whitney Sloan  [id number] DOB: 05/22/1956 DOA: 08/20/2022 PCP: Nolene Ebbs, MD   Brief Narrative: 67 year old with past medical history significant for chronic diastolic heart failure, diabetes type 2, hypertension, hyperlipidemia presented with sepsis due to right lower extremity cellulitis.  Of note patient recently presented to [facility] on 08/17/2022 after a ground-level mechanical fall after which she sustained abrasion of the anterior aspect of the right lower extremity.  X-ray at that time was negative for fracture.  She was subsequently discharged home.  She returned the day of admission complaining of developing redness, increased warmth and swelling at the abrasion site.  Patient admitted for right lower extremity cellulitis. Fall, Gout/Pseudogout of BL knees, worsening left LE weakness which has improved. Plan to discharge to SNF when bed available.   Assessment & Plan:   Principal Problem:   Cellulitis of right lower extremity Active Problems:   Chronic diastolic CHF (congestive heart failure) (HCC)   Essential hypertension   Hyperlipidemia   DM2 (diabetes mellitus, type 2) ([place])   Sepsis ([place])   Fall at home, initial encounter  1-Sepsis secondary to right lower extremity cellulitis: Right LE cellulitis  -Presented with worsening lower extremity redness, edema, \ -Continue IV antibiotics -Blood cultures: no growth to date.  -Treated with IV Vancomycin and Ceftriaxone initially-- transition to Ancef.  Redness improved, vital stable.  Transition to oral antibiotics at discharge.   2-Ground-level Mechanical Fall: -PT consulted.  Will benefit from rehab.   3-Bilateral Knee pain, worse right; Gout, Pseudo Gout.  Suspect gout, pseudogout.  Ortho consulted. Recommend Tx for gout /Pseudogout.  Treat with steroids/ day 3 prednisone/  Continue with colchicine and allopurinol.  X ray left knee negative. X ray right knee negative for fracture.  Pain  improved, swelling improved.   Urinary incontinence for over a year.  Discussed with Daughter, patient will need to be evaluated out patient by Urology.  Tx for UTI, UA with 21-50 WBC.  She has been able to urinate  Urine culture; No growth to date.   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  Worsening left LE weakness for last 3 days per family.  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  Weakness improved, she is able to move left leg better.   Fecal incontinence.  Could be related to inability to ambulate independent.  Needs follow up with GI out patient.  She has been able to tell staff when she needs to have Bowel movement.   Chronic Diastolic HF -Holding Diuretics\in setting sepsis.  -Resume lasix.   Diabetes Type 2: Hyperglycemia, uncontrolled.  Increased  Semglee to 64 units.  Increase  Meal coverage to 14 units.  Continue Farxiga.    HTN; Continue with Carvedilol.   Hyperlipidemia:  Continue with Zetia and lipitor.   Acute metabolic encephalopathy; Per daughter patient has been more lethargic, sleep at home.  She wonder if she has sleep apnea. I advised patient needs to follow up with PCP for sleep study.  - B 12 low, TSH normal , Ammonia normal.  ABG Oxygen low at 70  Acute hypoxia; placed on 2 L oxygen. Chest x ray negative for pulmonary edema.   B 12 Deficiency; 153. Started B 12 supplement.   Hypokalemia; Replaced.  Started oral supplement.      Estimated body mass index is 35.21 kg/m as  calculated from the following:   Height as of this encounter: '5\' 7"'$  (1.702 m).   Weight as of this encounter: 102 kg.   DVT prophylaxis: Lovenox Code Status: Full code Family Communication: Son at bedside 3/11, Try to contact daughters. No answer Disposition Plan:  Status is: Inpatient Remains inpatient appropriate because: management LE  cellulitis.     Consultants:  ortho  Procedures:    Antimicrobials:    Subjective: She is alert, conversant. Feeling better. Knee pain improved. She was able to ask for bedpan to have BM  Objective: Vitals:   08/23/22 0855 08/23/22 0907 08/23/22 1055 08/23/22 1200  BP: (!) 170/77 (!) 166/80 (!) 150/69   Pulse:   66   Resp: (!) '23 19  17  '$ Temp:      TempSrc:      SpO2:   98%   Weight:      Height:        Intake/Output Summary (Last 24 hours) at 08/23/2022 1501 Last data filed at 08/23/2022 1200 Gross per 24 hour  Intake 1148.95 ml  Output 750 ml  Net 398.95 ml    Filed Weights   08/21/22 0500 08/22/22 0500 08/23/22 0257  Weight: 101.5 kg 100.7 kg 102 kg    Examination: General NAD Respiratory system:  CTA Cardiovascular system:  s1, S 2 RRR Gastrointestinal system: BS present, soft, nt Central nervous system: Alert, Able to move left leg Extremities: Right knee less swelling. LE less redness.    Data Reviewed: I have personally reviewed following labs and imaging studies  CBC: Recent Labs  Lab 08/17/22 1758 08/20/22 2015 08/21/22 0018 08/22/22 0742 08/23/22 0442  WBC 11.0* 14.6* 13.8* 12.6* 15.4*  NEUTROABS 7.0 10.2* 9.4*  --   --   HGB 12.0 12.0 12.2 10.4* 10.4*  HCT 39.4 38.8 38.9 32.9* 33.0*  MCV 87.9 86.6 86.1 85.2 84.8  PLT 299 286 302 279 [id number]    Basic Metabolic Panel: Recent Labs  Lab 08/17/22 1758 08/20/22 2015 08/21/22 0017 08/21/22 0018 08/22/22 0742 08/23/22 0442  NA 138 137  --  138 133* 136  K 3.4* 4.6  --  4.4 3.2* 3.5  CL 101 98  --  98 97* 99  CO2 26 27  --  '29 26 25  '$ GLUCOSE 115* 121*  --  137* 337* 271*  BUN 19 22  --  20 30* 37*  CREATININE 0.73 0.85  --  0.83 1.00 0.94  CALCIUM 8.9 8.9  --  9.0 8.4* 8.8*  MG  --   --  1.9  --  2.1 2.3    GFR: Estimated Creatinine Clearance: 71.3 mL/min (by C-G formula based on SCr of 0.94 mg/dL). Liver Function Tests: Recent Labs  Lab 08/20/22 2015 08/21/22 0018  AST 24  14*  ALT 13 9  ALKPHOS 67 71  BILITOT 1.0 0.5  PROT 7.7 7.9  ALBUMIN 3.2* 3.2*    No results for input(s): "LIPASE", "AMYLASE" in the last 168 hours. Recent Labs  Lab 08/22/22 0047  AMMONIA 26    Coagulation Profile: No results for input(s): "INR", "PROTIME" in the last 168 hours. Cardiac Enzymes: No results for input(s): "CKTOTAL", "CKMB", "CKMBINDEX", "TROPONINI" in the last 168 hours. BNP (last 3 results) No results for input(s): "PROBNP" in the last 8760 hours. HbA1C: Recent Labs    08/21/22 0017  HGBA1C 13.9*    CBG: Recent Labs  Lab 08/22/22 1645 08/22/22 2209 08/23/22 0751 08/23/22 0910 08/23/22 1209  GLUCAP 205*  219* 184* 131* 167*    Lipid Profile: No results for input(s): "CHOL", "HDL", "LDLCALC", "TRIG", "CHOLHDL", "LDLDIRECT" in the last 72 hours. Thyroid Function Tests: Recent Labs    08/21/22 1056  TSH 0.874    Anemia Panel: Recent Labs    08/21/22 1056  VITAMINB12 153*    Sepsis Labs: Recent Labs  Lab 08/20/22 2015  LATICACIDVEN 1.4     Recent Results (from the past 240 hour(s))  Culture, blood (single)     Status: None (Preliminary result)   Collection Time: 08/20/22  8:15 PM   Specimen: BLOOD  Result Value Ref Range Status   Specimen Description   Final    BLOOD LEFT ANTECUBITAL Performed at [facility] [address], [place], [place] [address]    Special Requests   Final    BOTTLES DRAWN AEROBIC AND ANAEROBIC Blood Culture results may not be optimal due to an inadequate volume of blood received in culture bottles Performed at [facility] [address] [place], [place] [address]    Culture   Final    NO GROWTH 2 DAYS Performed at [facility] [address], [place], [place] [address]    Report Status PENDING  Incomplete  Culture, blood (single)     Status: None (Preliminary result)   Collection Time: 08/20/22 10:16 PM   Specimen: BLOOD  Result Value Ref Range  Status   Specimen Description   Final    BLOOD Performed at [facility] [address], [place], [place] [address]    Special Requests   Final    BOTTLES DRAWN AEROBIC AND ANAEROBIC Blood Culture results may not be optimal due to an inadequate volume of blood received in culture bottles Performed at [facility] [address], [place], [place] [address]    Culture   Final    NO GROWTH 2 DAYS Performed at [facility] [address], [place], [place] [address]    Report Status PENDING  Incomplete  Urine Culture (for pregnant, neutropenic or urologic patients or patients with an indwelling urinary catheter)     Status: None   Collection Time: 08/20/22 11:16 PM   Specimen: Urine, Clean Catch  Result Value Ref Range Status   Specimen Description   Final    URINE, CLEAN CATCH Performed at [facility], [place] [address], [place], [place] [address]    Special Requests   Final    NONE Performed at [facility], [place] [address] [place], [place] [address]    Culture   Final    NO GROWTH Performed at [facility], [place] [address], [place], [place] [address]    Report Status 08/22/2022 FINAL  Final  Urine Culture (for pregnant, neutropenic or urologic patients or patients with an indwelling urinary catheter)     Status: None   Collection Time: 08/22/22  4:59 AM   Specimen: Urine, Clean Catch  Result Value Ref Range Status   Specimen Description   Final    URINE, CLEAN CATCH Performed at [facility], [place] [address], [place], [place] [address]    Special Requests   Final    NONE Performed at [facility], [place] [address], [place], [place] [address]    Culture   Final    NO GROWTH Performed at [facility], [place] [address], [place],  [address]    Report Status  08/23/2022 FINAL  Final         Radiology Studies: MR BRAIN WO  CONTRAST  Result Date: 08/21/2022 CLINICAL DATA:  Left-sided weakness and confusion EXAM: MRI HEAD WITHOUT CONTRAST TECHNIQUE: Multiplanar, multiecho pulse sequences of the brain and surrounding structures were obtained without intravenous contrast. COMPARISON:  03/29/2009 FINDINGS: Brain: No acute infarct, mass effect or extra-axial collection. No chronic microhemorrhage or siderosis. There is multifocal hyperintense T2-weighted signal within the white matter. Parenchymal volume and CSF spaces are normal. Old left basal ganglia small vessel infarct. Punctate chronic infarct of the right cerebellum. The midline structures are normal. Vascular: Normal flow voids. Skull and upper cervical spine: Normal marrow signal. Sinuses/Orbits: Negative. Other: None. IMPRESSION: 1. No acute intracranial abnormality. 2. Old left basal ganglia small vessel infarct and findings of chronic small vessel ischemia. Electronically Signed   By: Ulyses Jarred M.D.   On: 08/21/2022 19:52   DG Chest 1 View  Result Date: 08/21/2022 CLINICAL DATA:  Hypoxia. EXAM: CHEST  1 VIEW COMPARISON:  12/12/2021. FINDINGS: 1731 hours. Low lung volumes accentuate the pulmonary vasculature and cardiomediastinal silhouette. No focal airspace opacity. No pleural effusion or pneumothorax. Visualized bones and upper abdomen are unremarkable. IMPRESSION: No evidence of acute cardiopulmonary disease. Electronically Signed   By: Emmit Alexanders M.D.   On: 08/21/2022 17:46        Scheduled Meds:  allopurinol  100 mg Oral Daily   aspirin EC  81 mg Oral BH-q7a   atorvastatin  20 mg Oral QPM   carvedilol  25 mg Oral BID WC   colchicine  0.6 mg Oral Daily   cyanocobalamin  1,000 mcg Intramuscular Daily   dapagliflozin propanediol  10 mg Oral QAC breakfast   enoxaparin (LOVENOX) injection  40 mg Subcutaneous Daily   ezetimibe  10 mg Oral QHS   insulin aspart  0-15 Units Subcutaneous TID WC   insulin aspart  12 Units Subcutaneous TID WC   insulin  glargine-yfgn  60 Units Subcutaneous Daily   losartan  100 mg Oral Daily   predniSONE  40 mg Oral Daily   Continuous Infusions:   ceFAZolin (ANCEF) IV       LOS: 3 days    Time spent: 35 minutes    Soni Kegel A Janes Colegrove, MD [facility]   If 7PM-7AM, please contact night-coverage [email or web address]  08/23/2022, 3:01 PM

## 2022-08-23 NOTE — Inpatient Diabetes Management (Signed)
Inpatient Diabetes Program Recommendations  AACE/ADA: New Consensus Statement on Inpatient Glycemic Control (2015)  Target Ranges:  Prepandial:   less than 140 mg/dL      Peak postprandial:   less than 180 mg/dL (1-2 hours)      Critically ill patients:  140 - 180 mg/dL   Lab Results  Component Value Date   GLUCAP 131 (H) 08/23/2022   HGBA1C 13.9 (H) 08/21/2022    Review of Glycemic Control  Diabetes history: DM2 Outpatient Diabetes medications: Farxiga 10 QD, Tresiba 80 QD, Novolog 20 TID, metformin 500 QD Current orders for Inpatient glycemic control: Semglee 60 QD, Novolog 0-15 TID + 12 units TID. On Pred 40 QD  HgbA1C - 13.9% Has Dexcom CGM 184, 131 mg/dL  Inpatient Diabetes Program Recommendations:   Consider increasing Semglee to 64 units QD  Consider increasing Novolog to 14 units TID with meals if eating > 50%  Spoke with pt at bedside regarding her HgbA1C of 13.9% (average blood sugar 352 mg/dL) and diabetes control at home. Pt states she sometimes forgets to take her long-acting insulin and thinks this is why her HgbA1C is so high. We discussed setting alarm on phone each day to remember to take it. Discussed complications associated with uncontrolled DM, importance of lifestyle modification with healthy diet using portion control, trying to get a little exercise every day, and stress management. She follows up with PCP for diabetes management. Answered all questions. Encouraged pt to take care of herself, plan ahead for meals and have healthy snack options available.  Pt appreciative of visit.   Will follow.  Thank you. Lorenda Peck, RD, LDN, [place] Inpatient Diabetes Coordinator [phone]

## 2022-08-23 NOTE — TOC Initial Note (Signed)
Transition of Care [facility]) - Initial/Assessment Note    Patient Details  Name: Whitney Sloan MRN: [id number] Date of Birth: 02/16/1956  [facility]) CM/SW Contact:    Dessa Phi, RN Phone Number: 08/23/2022, 4:19 PM  Clinical Narrative: Per patient permission-faxed out await bed offers.                  Expected Discharge Plan: Skilled Nursing Facility Barriers to Discharge: Continued Medical Work up   Patient Goals and CMS Choice            Expected Discharge Plan and Services                                              Prior Living Arrangements/Services                       Activities of Daily Living Home Assistive Devices/Equipment: Environmental consultant (specify type), Cane (specify quad or straight), Eyeglasses ADL Screening (condition at time of admission) Patient's cognitive ability adequate to safely complete daily activities?: Yes Is the patient deaf or have difficulty hearing?: No Does the patient have difficulty seeing, even when wearing glasses/contacts?: No Does the patient have difficulty concentrating, remembering, or making decisions?: No Patient able to express need for assistance with ADLs?: Yes Does the patient have difficulty dressing or bathing?: Yes Independently performs ADLs?: No Communication: Independent Dressing (OT): Needs assistance Is this a change from baseline?: Pre-admission baseline Grooming: Needs assistance Is this a change from baseline?: Pre-admission baseline Feeding: Independent Bathing: Needs assistance Is this a change from baseline?: Pre-admission baseline Toileting: Needs assistance Is this a change from baseline?: Pre-admission baseline In/Out Bed: Needs assistance Is this a change from baseline?: Pre-admission baseline Walks in Home: Needs assistance Is this a change from baseline?: Pre-admission baseline Does the patient have difficulty walking or climbing stairs?: Yes Weakness of Legs:  Both Weakness of Arms/Hands: None  Permission Sought/Granted                  Emotional Assessment              Admission diagnosis:  Cellulitis of right lower extremity [L03.115] Patient Active Problem List   Diagnosis Date Noted   Sepsis ([place]) 08/21/2022   Fall at home, initial encounter 08/21/2022   Cellulitis of right lower extremity 08/20/2022   Pressure injury of skin 09/02/2021   Acute encephalopathy 08/31/2021   UTI (urinary tract infection) 08/31/2021   AKI (acute kidney injury) ([place]) 08/30/2021   Elevated troponin 08/30/2021   (HFpEF) heart failure with preserved ejection fraction ([place]) 08/30/2021   Generalized weakness 08/30/2021   COVID-19 virus infection 12/13/2020   DM2 (diabetes mellitus, type 2) ([place]) 10/15/2020   Right knee pain 04/10/2020   Chest pain of uncertain etiology [id number]   CAP (community acquired pneumonia) 12/19/2019   Constipation 12/10/2019   Cholelithiasis 12/10/2019   Thyroid nodule 12/10/2019   Obesity (BMI 30-39.9) 12/10/2019   Acute on chronic combined systolic and diastolic CHF (congestive heart failure) ([place]) 11/08/2019   Acute respiratory failure with hypoxia ([place]) 11/08/2019   Chronic diastolic CHF (congestive heart failure) ([place]) 11/08/2019   Hypertensive urgency 11/08/2019   Mixed diabetic hyperlipidemia associated with type 2 diabetes mellitus ([place]) 11/08/2019   Type 2 diabetes mellitus with diabetic polyneuropathy, with long-term current use of insulin ([place]) 08/08/2019  Hyperlipidemia 03/23/2010   DECREASED HEARING, BILATERAL 02/05/2010   MEMORY LOSS 02/05/2010   INCONTINENCE 02/05/2010   NEOPLASM, MALIGNANT, BREAST, HX OF 01/13/2010   ANXIETY STATE, UNSPECIFIED 12/02/2009   UNSTEADY GAIT 12/02/2009   TOE PAIN 09/24/2009   ANAL OR RECTAL PAIN 08/25/2009   LEG EDEMA, BILATERAL 08/25/2009   Uncontrolled type 2 diabetes mellitus with hyperglycemia, with long-term current use of insulin ([place]) 08/06/2009   Essential  hypertension 08/06/2009   DERMATITIS, ATOPIC 08/06/2009   PCP:  Nolene Ebbs, MD Pharmacy:   [facility] [phone] - [place] [place] [address] Phone: [phone] Fax: [phone]  [facility] [address] [place] [address] Phone: [phone] Fax: [phone]  [facility] [place] [place] - [address] [address] Phone: [phone] Fax: [phone]     Social Determinants of Health (SDOH) Social History: SDOH Screenings   Food Insecurity: No Food Insecurity (08/21/2022)  Housing: Low Risk  (08/21/2022)  Transportation Needs: No Transportation Needs (08/21/2022)  Utilities: Not At Risk (08/21/2022)  Depression (PHQ2-9): Low Risk  (03/24/2021)  Tobacco Use: Low Risk  (08/20/2022)   SDOH Interventions:     Readmission Risk Interventions    09/02/2021    3:21 PM  Readmission Risk Prevention Plan  Post Dischage Appt Complete  Medication Screening Complete  Transportation Screening Complete

## 2022-08-23 NOTE — [place] FL2 (Signed)
[place] MEDICAID FL2 LEVEL OF CARE FORM     IDENTIFICATION  Patient Name: Whitney Sloan Birthdate: [birth year] Sex: female Admission Date (Current Location): 08/20/2022  [facility] and [place] Number:  Herbalist and Address:  [facility],  [place] [address], [place]      Provider Number: [id number]  Attending Physician Name and Address:  Elmarie Shiley, MD  Relative Name and Phone Number:   Jerene Pitch [place]([phone]    Current Level of Care: Hospital Recommended Level of Care: [place] Prior Approval Number:    Date Approved/Denied:   PASRR Number:  ([id number])  Discharge Plan: SNF    Current Diagnoses: Patient Active Problem List   Diagnosis Date Noted   Sepsis ([place]) 08/21/2022   Fall at home, initial encounter 08/21/2022   Cellulitis of right lower extremity 08/20/2022   Pressure injury of skin 09/02/2021   Acute encephalopathy 08/31/2021   UTI (urinary tract infection) 08/31/2021   AKI (acute kidney injury) ([place]) 08/30/2021   Elevated troponin 08/30/2021   (HFpEF) heart failure with preserved ejection fraction ([place]) 08/30/2021   Generalized weakness 08/30/2021   COVID-19 virus infection 12/13/2020   DM2 (diabetes mellitus, type 2) ([place]) 10/15/2020   Right knee pain 04/10/2020   Chest pain of uncertain etiology [id number]   CAP (community acquired pneumonia) 12/19/2019   Constipation 12/10/2019   Cholelithiasis 12/10/2019   Thyroid nodule 12/10/2019   Obesity (BMI 30-39.9) 12/10/2019   Acute on chronic combined systolic and diastolic CHF (congestive heart failure) ([place]) 11/08/2019   Acute respiratory failure with hypoxia (HCC) 11/08/2019   Chronic diastolic CHF (congestive heart failure) ([place]) 11/08/2019   Hypertensive urgency 11/08/2019   Mixed diabetic hyperlipidemia associated with type 2 diabetes mellitus ([place]) 11/08/2019   Type 2 diabetes mellitus with diabetic polyneuropathy, with long-term  current use of insulin ([place]) 08/08/2019   Hyperlipidemia 03/23/2010   DECREASED HEARING, BILATERAL 02/05/2010   MEMORY LOSS 02/05/2010   INCONTINENCE 02/05/2010   NEOPLASM, MALIGNANT, BREAST, HX OF 01/13/2010   ANXIETY STATE, UNSPECIFIED 12/02/2009   UNSTEADY GAIT 12/02/2009   TOE PAIN 09/24/2009   ANAL OR RECTAL PAIN 08/25/2009   LEG EDEMA, BILATERAL 08/25/2009   Uncontrolled type 2 diabetes mellitus with hyperglycemia, with long-term current use of insulin ([place]) 08/06/2009   Essential hypertension 08/06/2009   DERMATITIS, ATOPIC 08/06/2009    Orientation RESPIRATION BLADDER Height & Weight     Self, Time, Situation, Place  Normal   Weight: 102 kg Height:  '5\' 7"'$  (170.2 cm)  BEHAVIORAL SYMPTOMS/MOOD NEUROLOGICAL BOWEL NUTRITION STATUS      Continent Diet (Carb Mod)  AMBULATORY STATUS COMMUNICATION OF NEEDS Skin   Extensive Assist Verbally Normal                       Personal Care Assistance Level of Assistance  Bathing, Feeding, Dressing Bathing Assistance: Maximum assistance Feeding assistance: Limited assistance Dressing Assistance: Maximum assistance     Functional Limitations Info  Sight, Hearing, Speech Sight Info: Impaired (readers) Hearing Info: Adequate Speech Info: Adequate    SPECIAL CARE FACTORS FREQUENCY  PT (By licensed PT), OT (By licensed OT)     PT Frequency:  (5x week) OT Frequency:  (5x week)            Contractures Contractures Info: Not present    Additional Factors Info  Code Status, Allergies, Insulin Sliding Scale Code Status Info:  (Full) Allergies Info:  (Oxycodone-acetaminophen)  Insulin Sliding Scale Info:  (see d/c summary)       Current Medications (08/23/2022):  This is the current hospital active medication list Current Facility-Administered Medications  Medication Dose Route Frequency Provider Last Rate Last Admin   acetaminophen (TYLENOL) tablet 650 mg  650 mg Oral Q6H PRN Howerter, Justin B, DO   650 mg at  08/21/22 0827   Or   acetaminophen (TYLENOL) suppository 650 mg  650 mg Rectal Q6H PRN Howerter, Justin B, DO       allopurinol (ZYLOPRIM) tablet 100 mg  100 mg Oral Daily Regalado, Belkys A, MD   100 mg at 08/23/22 0937   aspirin EC tablet 81 mg  81 mg Oral BH-q7a Howerter, Justin B, DO   81 mg at 08/23/22 0819   atorvastatin (LIPITOR) tablet 20 mg  20 mg Oral QPM Howerter, Justin B, DO   20 mg at 08/22/22 1737   carvedilol (COREG) tablet 25 mg  25 mg Oral BID WC Suella Broad A, PA-C   25 mg at 08/23/22 0819   ceFAZolin (ANCEF) IVPB 2g/100 mL premix  2 g Intravenous Q8H Regalado, Belkys A, MD       colchicine tablet 0.6 mg  0.6 mg Oral Daily Regalado, Belkys A, MD   0.6 mg at 08/23/22 0920   cyanocobalamin (VITAMIN B12) injection 1,000 mcg  1,000 mcg Intramuscular Daily Regalado, Belkys A, MD   1,000 mcg at 08/23/22 0937   dapagliflozin propanediol (FARXIGA) tablet 10 mg  10 mg Oral QAC breakfast Regalado, Belkys A, MD   10 mg at 08/23/22 0819   enoxaparin (LOVENOX) injection 40 mg  40 mg Subcutaneous Daily Polly Cobia, RPH   40 mg at 08/23/22 0926   ezetimibe (ZETIA) tablet 10 mg  10 mg Oral QHS Howerter, Justin B, DO   10 mg at 08/22/22 2157   furosemide (LASIX) tablet 20 mg  20 mg Oral Daily Regalado, Belkys A, MD   20 mg at 08/23/22 1513   insulin aspart (novoLOG) injection 0-15 Units  0-15 Units Subcutaneous TID WC Howerter, Justin B, DO   3 Units at 08/23/22 1243   insulin aspart (novoLOG) injection 14 Units  14 Units Subcutaneous TID WC Regalado, Belkys A, MD       [START ON 08/24/2022] insulin glargine-yfgn (SEMGLEE) injection 64 Units  64 Units Subcutaneous Daily Regalado, Belkys A, MD       ketorolac (TORADOL) 15 MG/ML injection 15 mg  15 mg Intravenous Q8H PRN Regalado, Belkys A, MD       losartan (COZAAR) tablet 100 mg  100 mg Oral Daily Howerter, Justin B, DO   100 mg at 08/23/22 [id number]   melatonin tablet 3 mg  3 mg Oral QHS PRN Howerter, Justin B, DO       naloxone (NARCAN)  injection 0.4 mg  0.4 mg Intravenous PRN Howerter, Justin B, DO       ondansetron (ZOFRAN) injection 4 mg  4 mg Intravenous Q6H PRN Howerter, Justin B, DO       potassium chloride SA (KLOR-CON M) CR tablet 20 mEq  20 mEq Oral Daily Regalado, Belkys A, MD       predniSONE (DELTASONE) tablet 40 mg  40 mg Oral Daily Regalado, Belkys A, MD   40 mg at 08/23/22 0920     Discharge Medications: Please see discharge summary for a list of discharge medications.  Relevant Imaging Results:  Relevant Lab Results:   Additional Information SS# [place]  Dessa Phi, RN

## 2022-08-24 DIAGNOSIS — L03115 Cellulitis of right lower limb: Secondary | ICD-10-CM | POA: Diagnosis not present

## 2022-08-24 LAB — BASIC METABOLIC PANEL
Anion gap: 10 (ref 5–15)
BUN: 26 mg/dL — ABNORMAL HIGH (ref 8–23)
CO2: 27 mmol/L (ref 22–32)
Calcium: 8.7 mg/dL — ABNORMAL LOW (ref 8.9–10.3)
Chloride: 104 mmol/L (ref 98–111)
Creatinine, Ser: 0.78 mg/dL (ref 0.44–1.00)
GFR, Estimated: >60 mL/min (ref >60)
Glucose, Bld: 159 mg/dL — ABNORMAL HIGH (ref 70–99)
Potassium: 3.2 mmol/L — ABNORMAL LOW (ref 3.5–5.1)
Sodium: 141 mmol/L (ref 135–145)

## 2022-08-24 LAB — CBC
HCT: 34.8 % — ABNORMAL LOW (ref 36.0–46.0)
Hemoglobin: 10.9 g/dL — ABNORMAL LOW (ref 12.0–15.0)
MCH: 26.8 pg (ref 26.0–34.0)
MCHC: 31.3 g/dL (ref 30.0–36.0)
MCV: 85.5 fL (ref 80.0–100.0)
Platelets: 329 10*3/uL (ref 150–400)
RBC: 4.07 MIL/uL (ref 3.87–5.11)
RDW: 13.5 % (ref 11.5–15.5)
WBC: 13.4 10*3/uL — ABNORMAL HIGH (ref 4.0–10.5)
nRBC: 0 % (ref 0.0–0.2)

## 2022-08-24 LAB — GLUCOSE, CAPILLARY
Glucose-Capillary: 103 mg/dL — ABNORMAL HIGH (ref 70–99)
Glucose-Capillary: 99 mg/dL (ref 70–99)
Glucose-Capillary: 199 mg/dL — ABNORMAL HIGH (ref 70–99)
Glucose-Capillary: 163 mg/dL — ABNORMAL HIGH (ref 70–99)
Glucose-Capillary: 156 mg/dL — ABNORMAL HIGH (ref 70–99)
Glucose-Capillary: 251 mg/dL — ABNORMAL HIGH (ref 70–99)
Glucose-Capillary: 220 mg/dL — ABNORMAL HIGH (ref 70–99)

## 2022-08-24 MED ORDER — SENNOSIDES-DOCUSATE SODIUM 8.6-50 MG PO TABS
1.0000 | ORAL_TABLET | Freq: Every evening | ORAL | Status: DC | PRN
  Administered 2022-08-25 – 2022-08-26 (×2): 1 via ORAL
  Filled 2022-08-24 (×2): qty 1

## 2022-08-24 MED ORDER — GUAIFENESIN 100 MG/5ML PO LIQD: 5.0000 mL | ORAL | Status: DC | PRN

## 2022-08-24 MED ORDER — POTASSIUM CHLORIDE 20 MEQ PO PACK
40.0000 meq | PACK | Freq: Once | ORAL | Status: AC
  Administered 2022-08-24: 40 meq via ORAL
  Filled 2022-08-24: qty 2

## 2022-08-24 MED ORDER — HYDRALAZINE HCL 20 MG/ML IJ SOLN
10.0000 mg | INTRAMUSCULAR | Status: DC | PRN
  Administered 2022-08-24: 10 mg via INTRAVENOUS

## 2022-08-24 MED ORDER — OXYCODONE HCL 5 MG PO TABS: 5.0000 mg | ORAL_TABLET | ORAL | Status: DC | PRN

## 2022-08-24 MED ORDER — IPRATROPIUM-ALBUTEROL 0.5-2.5 (3) MG/3ML IN SOLN: 3.0000 mL | RESPIRATORY_TRACT | Status: DC | PRN

## 2022-08-24 MED ORDER — TRAZODONE HCL 50 MG PO TABS
50.0000 mg | ORAL_TABLET | Freq: Every evening | ORAL | Status: DC | PRN
  Administered 2022-08-26: 50 mg via ORAL
  Filled 2022-08-24: qty 1

## 2022-08-24 MED ORDER — METOPROLOL TARTRATE 5 MG/5ML IV SOLN: 5.0000 mg | INTRAVENOUS | Status: DC | PRN

## 2022-08-24 NOTE — Care Management Important Message (Signed)
Important Message  Patient Details  Name: Whitney Sloan MRN: [id number] Date of Birth: [birth year]   Medicare Important Message Given:  Yes     Memory Argue 08/24/2022, 4:27 PM

## 2022-08-24 NOTE — TOC Progression Note (Signed)
Transition of Care [facility]) - Progression Note    Patient Details  Name: Whitney Sloan MRN: [id number] Date of Birth: April 20, 1956  Transition of Care [facility]) CM/SW Contact  Zayaan Kozak, Juliann Pulse, RN Phone Number: 08/24/2022, 3:07 PM  Clinical Narrative: Bed offers given await choice.      Expected Discharge Plan: [place] Barriers to Discharge: Continued Medical Work up  Expected Discharge Plan and Services                                               Social Determinants of Health (SDOH) Interventions SDOH Screenings   Food Insecurity: No Food Insecurity (08/21/2022)  Housing: Low Risk  (08/21/2022)  Transportation Needs: No Transportation Needs (08/21/2022)  Utilities: Not At Risk (08/21/2022)  Depression (PHQ2-9): Low Risk  (03/24/2021)  Tobacco Use: Low Risk  (08/20/2022)    Readmission Risk Interventions    09/02/2021    3:21 PM  Readmission Risk Prevention Plan  Post Dischage Appt Complete  Medication Screening Complete  Transportation Screening Complete

## 2022-08-24 NOTE — Progress Notes (Signed)
Physical Therapy Treatment Patient Details Name: Whitney Sloan MRN: [id number] DOB: Dec 23, 1955 Today's Date: 08/24/2022   History of Present Illness 67 yo female admitted with cellultis, sepsis, fall at home, c/o bil LE pain. (-) fx, (+) joint effusion, pseudogout? per ortho consult. Hx of DM, CHF, morbid obesity    PT Comments    Pt reports improved pain control! She was able to stand and step over to the recliner using a RW-Min guard A on today. Will continue to follow and progress activity. Significant improvement on today! Family not present during session-could consider d/c home with HHPT.     Recommendations for follow up therapy are one component of a multi-disciplinary discharge planning process, led by the attending physician.  Recommendations may be updated based on patient status, additional functional criteria and insurance authorization.  Follow Up Recommendations  Skilled nursing-short term rehab (<3 hours/day) Can patient physically be transported by private vehicle: Yes   Assistance Recommended at Discharge Frequent or constant Supervision/Assistance  Patient can return home with the following A little help with walking and/or transfers;A little help with bathing/dressing/bathroom;Assistance with cooking/housework;Assist for transportation;Help with stairs or ramp for entrance   Equipment Recommendations  Rolling walker (2 wheels)    Recommendations for Other Services       Precautions / Restrictions Precautions Precautions: Fall Restrictions Weight Bearing Restrictions: No     Mobility  Bed Mobility Overal bed mobility: Needs Assistance Bed Mobility: Supine to Sit     Supine to sit: Supervision, HOB elevated     General bed mobility comments: Cues provided. Supv for safety. Increased time but no assist required.    Transfers Overall transfer level: Needs assistance Equipment used: Rolling walker (2 wheels) Transfers: Sit to/from Stand, Bed to  chair/wheelchair/BSC Sit to Stand: Min guard, From elevated surface   Step pivot transfers: Min guard, From elevated surface       General transfer comment: Stood x 1 with STEDY for safety-pt stood for several minutes while being cleaned up (bed soaked with urine). Sat down briefly while STEDY switched out for RW. Pt then stood and performed a step pivot over to recliner using RW.    Ambulation/Gait                   Stairs             Wheelchair Mobility    Modified Rankin (Stroke Patients Only)       Balance Overall balance assessment: Needs assistance, History of Falls   Sitting balance-Leahy Scale: Good     Standing balance support: During functional activity, Bilateral upper extremity supported Standing balance-Leahy Scale: Fair                              Cognition Arousal/Alertness: Awake/alert Behavior During Therapy: WFL for tasks assessed/performed Overall Cognitive Status: Within Functional Limits for tasks assessed                                          Exercises      General Comments        Pertinent Vitals/Pain Pain Assessment Pain Assessment: 0-10 Pain Score: 4  Pain Location: bil LEs Pain Descriptors / Indicators: Discomfort, Sore Pain Intervention(s): Monitored during session, Repositioned    Home Living  Prior Function            PT Goals (current goals can now be found in the care plan section) Progress towards PT goals: Progressing toward goals    Frequency    Min 2X/week      PT Plan Current plan remains appropriate    Co-evaluation              AM-PAC PT "6 Clicks" Mobility   Outcome Measure  Help needed turning from your back to your side while in a flat bed without using bedrails?: A Little Help needed moving from lying on your back to sitting on the side of a flat bed without using bedrails?: A Little Help needed moving to and  from a bed to a chair (including a wheelchair)?: A Little Help needed standing up from a chair using your arms (e.g., wheelchair or bedside chair)?: A Little Help needed to walk in hospital room?: A Little Help needed climbing 3-5 steps with a railing? : A Lot 6 Click Score: 17    End of Session Equipment Utilized During Treatment: Gait belt Activity Tolerance: Patient tolerated treatment well Patient left: in chair;with call bell/phone within reach;with chair alarm set Nurse Communication: Mobility status (and to consider having pt use bsc instead of purewick) PT Visit Diagnosis: Muscle weakness (generalized) (M62.81);History of falling (Z91.81);Pain;Difficulty in walking, not elsewhere classified (R26.2)     Time: [id number] PT Time Calculation (min) (ACUTE ONLY): 15 min  Charges:  $Gait Training: 8-22 mins                         Doreatha Massed, PT Acute Rehabilitation  Office: [phone]

## 2022-08-24 NOTE — Progress Notes (Signed)
PROGRESS NOTE    Whitney Sloan  [id number] DOB: 1956-05-09 DOA: 08/20/2022 PCP: Nolene Ebbs, MD   Brief Narrative:  67 year old with past medical history significant for chronic diastolic heart failure, diabetes type 2, hypertension, hyperlipidemia presented with sepsis due to right lower extremity cellulitis.   Of note patient recently presented to [facility] on 08/17/2022 after a ground-level mechanical fall after which she sustained abrasion of the anterior aspect of the right lower extremity.  X-ray at that time was negative for fracture.  She was subsequently discharged home.  She returned the day of admission complaining of developing redness, increased warmth and swelling at the abrasion site.   Patient admitted for right lower extremity cellulitis. Fall, Gout/Pseudogout of BL knees, worsening left LE weakness which has improved. Plan to discharge to SNF when bed available.      Assessment & Plan:  Principal Problem:   Cellulitis of right lower extremity Active Problems:   Chronic diastolic CHF (congestive heart failure) ([place])   Essential hypertension   Hyperlipidemia   DM2 (diabetes mellitus, type 2) ([place])   Sepsis ([place])   Fall at home, initial encounter     Sepsis secondary to right lower extremity cellulitis: Right LE cellulitis  Sepsis physiology improving, initially on IV vancomycin and Rocephin.  Currently on Ancef.  Will transition to oral Keflex upon discharge   Ground-level Mechanical Fall: PT recommend SNF   Bilateral Knee pain, worse right; Gout, Pseudo Gout.  Suspect gout, pseudogout.  Ortho consulted. Recommend Tx for gout /Pseudogout.  Treat with steroids/ day 3 prednisone/  Continue with colchicine and allopurinol.  X ray left knee negative. X ray right knee negative for fracture.  Pain improved, swelling improved.    Urinary incontinence for over a year.  Discussed with Daughter, patient will need to be evaluated out patient by Urology.  Tx for  UTI, UA with 21-50 WBC.  She has been able to urinate  Urine culture; No growth to date.    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  Worsening left LE weakness for last 3 days per family.  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  Weakness improved, she is able to move left leg better.    Fecal incontinence.  Could be related to inability to ambulate independent.  Needs follow up with GI out patient.  She has been able to tell staff when she needs to have Bowel movement.    Chronic Diastolic HF Resume Lasix. Coreg, losartan   Diabetes Type 2: Hyperglycemia, uncontrolled.  On Semglee 64 units, NovoLog 14 units Premeal.  Sliding scale.  Farxiga     HTN; Continue with Carvedilol and Losartan   Hyperlipidemia:  Continue with Zetia and lipitor.    Acute metabolic encephalopathy; Per daughter patient has been more lethargic, sleep at home.  She wonder if she has sleep apnea. I advised patient needs to follow up with PCP for sleep study. MRI Brain is neg.  - B 12 low, TSH normal , Ammonia normal.  ABG Oxygen low at 70   Acute hypoxia; placed on 2 L oxygen. Chest x ray negative for pulmonary edema.    B 12 Deficiency; 153. Started B 12 supplement.    Hypokalemia; Replaced.  Started oral supplement.      Estimated body mass index is 35.21 kg/m as calculated from the following:   Height  as of this encounter: '5\' 7"'$  (1.702 m).   Weight as of this encounter: 102 kg.     DVT prophylaxis: Lovenox Code Status: Full code Family Communication:  SIL at bedside Disposition Plan:  Status is: Inpatient Remains inpatient appropriate because: management LE cellulitis. SNF placement.       Subjective:  Feels ok no complaints.   Examination:  General exam: Appears calm and comfortable  Respiratory system: Clear to auscultation. Respiratory effort  normal. Cardiovascular system: S1 & S2 heard, RRR. No JVD, murmurs, rubs, gallops or clicks. No pedal edema. Gastrointestinal system: Abdomen is nondistended, soft and nontender. No organomegaly or masses felt. Normal bowel sounds heard. Central nervous system: Alert and oriented. No focal neurological deficits. Extremities: Symmetric 5 x 5 power. Skin: No rashes, lesions or ulcers Psychiatry: Judgement and insight appear normal. Mood & affect appropriate.     Objective: Vitals:   08/23/22 2005 08/24/22 0559 08/24/22 0810 08/24/22 0817  BP: (!) 169/76 (!) 178/86    Pulse: 66 64  70  Resp: 18 18    Temp: 98 F (36.7 C) 98.2 F (36.8 C) 98.6 F (37 C)   TempSrc: Oral Oral Oral   SpO2: 98% 96%    Weight:  104.1 kg    Height:        Intake/Output Summary (Last 24 hours) at 08/24/2022 0842 Last data filed at 08/24/2022 0700 Gross per 24 hour  Intake 600 ml  Output 1150 ml  Net -550 ml   Filed Weights   08/22/22 0500 08/23/22 0257 08/24/22 0559  Weight: 100.7 kg 102 kg 104.1 kg     Data Reviewed:   CBC: Recent Labs  Lab 08/17/22 1758 08/20/22 2015 08/21/22 0018 08/22/22 0742 08/23/22 0442  WBC 11.0* 14.6* 13.8* 12.6* 15.4*  NEUTROABS 7.0 10.2* 9.4*  --   --   HGB 12.0 12.0 12.2 10.4* 10.4*  HCT 39.4 38.8 38.9 32.9* 33.0*  MCV 87.9 86.6 86.1 85.2 84.8  PLT 299 286 302 279 [id number]   Basic Metabolic Panel: Recent Labs  Lab 08/17/22 1758 08/20/22 2015 08/21/22 0017 08/21/22 0018 08/22/22 0742 08/23/22 0442  NA 138 137  --  138 133* 136  K 3.4* 4.6  --  4.4 3.2* 3.5  CL 101 98  --  98 97* 99  CO2 26 27  --  '29 26 25  '$ GLUCOSE 115* 121*  --  137* 337* 271*  BUN 19 22  --  20 30* 37*  CREATININE 0.73 0.85  --  0.83 1.00 0.94  CALCIUM 8.9 8.9  --  9.0 8.4* 8.8*  MG  --   --  1.9  --  2.1 2.3   GFR: Estimated Creatinine Clearance: 72.1 mL/min (by C-G formula based on SCr of 0.94 mg/dL). Liver Function Tests: Recent Labs  Lab 08/20/22 2015 08/21/22 0018  AST  24 14*  ALT 13 9  ALKPHOS 67 71  BILITOT 1.0 0.5  PROT 7.7 7.9  ALBUMIN 3.2* 3.2*   No results for input(s): "LIPASE", "AMYLASE" in the last 168 hours. Recent Labs  Lab 08/22/22 0047  AMMONIA 26   Coagulation Profile: No results for input(s): "INR", "PROTIME" in the last 168 hours. Cardiac Enzymes: No results for input(s): "CKTOTAL", "CKMB", "CKMBINDEX", "TROPONINI" in the last 168 hours. BNP (last 3 results) No results for input(s): "PROBNP" in the last 8760 hours. HbA1C: No results for input(s): "HGBA1C" in the last 72 hours. CBG: Recent Labs  Lab 08/23/22 1209 08/23/22 1656 08/23/22  2144 08/24/22 0740 08/24/22 0809  GLUCAP 167* 126* 189* 103* 99   Lipid Profile: No results for input(s): "CHOL", "HDL", "LDLCALC", "TRIG", "CHOLHDL", "LDLDIRECT" in the last 72 hours. Thyroid Function Tests: Recent Labs    08/21/22 1056  TSH 0.874   Anemia Panel: Recent Labs    08/21/22 1056  VITAMINB12 153*   Sepsis Labs: Recent Labs  Lab 08/20/22 2015  LATICACIDVEN 1.4    Recent Results (from the past 240 hour(s))  Culture, blood (single)     Status: None (Preliminary result)   Collection Time: 08/20/22  8:15 PM   Specimen: BLOOD  Result Value Ref Range Status   Specimen Description   Final    BLOOD LEFT ANTECUBITAL Performed at [facility] [address], [place], [place] [address]    Special Requests   Final    BOTTLES DRAWN AEROBIC AND ANAEROBIC Blood Culture results may not be optimal due to an inadequate volume of blood received in culture bottles Performed at [facility] [address], [place], [place] [address]    Culture   Final    NO GROWTH 3 DAYS Performed at [facility], [place] [address], [place], [place] [address]    Report Status PENDING  Incomplete  Culture, blood (single)     Status: None (Preliminary result)   Collection Time: 08/20/22 10:16 PM   Specimen: BLOOD  Result Value Ref Range Status    Specimen Description   Final    BLOOD Performed at [facility] [address], [place], [place] [address]    Special Requests   Final    BOTTLES DRAWN AEROBIC AND ANAEROBIC Blood Culture results may not be optimal due to an inadequate volume of blood received in culture bottles Performed at [facility] [address] [place], [place] [address]    Culture   Final    NO GROWTH 3 DAYS Performed at [facility], [place] [address] [place], [place] [address]    Report Status PENDING  Incomplete  Urine Culture (for pregnant, neutropenic or urologic patients or patients with an indwelling urinary catheter)     Status: None   Collection Time: 08/20/22 11:16 PM   Specimen: Urine, Clean Catch  Result Value Ref Range Status   Specimen Description   Final    URINE, CLEAN CATCH Performed at [facility], [place] [address] [place], [place] [address]    Special Requests   Final    NONE Performed at [facility], [place] [address], [place], [place] [address]    Culture   Final    NO GROWTH Performed at [facility], [place] [address] [place], [place] [address]    Report Status 08/22/2022 FINAL  Final  Urine Culture (for pregnant, neutropenic or urologic patients or patients with an indwelling urinary catheter)     Status: None   Collection Time: 08/22/22  4:59 AM   Specimen: Urine, Clean Catch  Result Value Ref Range Status   Specimen Description   Final    URINE, CLEAN CATCH Performed at [facility], [place] [address], [place], [place] [address]    Special Requests   Final    NONE Performed at [facility], [place] [address] [place], [place] [address]    Culture   Final    NO GROWTH Performed at [facility], [place] [address], [place], [place] [address]  Report Status 08/23/2022 FINAL  Final         Radiology Studies: No results found.      Scheduled  Meds:  allopurinol  100 mg Oral Daily   aspirin EC  81 mg Oral BH-q7a   atorvastatin  20 mg Oral QPM   carvedilol  25 mg Oral BID WC   colchicine  0.6 mg Oral Daily   cyanocobalamin  1,000 mcg Intramuscular Daily   dapagliflozin propanediol  10 mg Oral QAC breakfast   enoxaparin (LOVENOX) injection  40 mg Subcutaneous Daily   ezetimibe  10 mg Oral QHS   furosemide  20 mg Oral Daily   insulin aspart  0-15 Units Subcutaneous TID WC   insulin aspart  14 Units Subcutaneous TID WC   insulin glargine-yfgn  64 Units Subcutaneous Daily   losartan  100 mg Oral Daily   potassium chloride  20 mEq Oral Daily   predniSONE  40 mg Oral Daily   Continuous Infusions:   ceFAZolin (ANCEF) IV 2 g (08/24/22 0536)     LOS: 4 days   Time spent= 35 mins    Avion Patella Arsenio Loader, MD [facility]  If 7PM-7AM, please contact night-coverage  08/24/2022, 8:42 AM

## 2022-08-24 NOTE — Progress Notes (Signed)
Messaged [facility] Hospitalist regarding BS this morning and scheduled insulin doses.  Instructed to hold morning Humalog dose and recheck BS prior to 64 units of Semglee.  Will monitor closely.

## 2022-08-25 DIAGNOSIS — L03115 Cellulitis of right lower limb: Secondary | ICD-10-CM | POA: Diagnosis not present

## 2022-08-25 LAB — CBC
HCT: 35.5 % — ABNORMAL LOW (ref 36.0–46.0)
Hemoglobin: 11.2 g/dL — ABNORMAL LOW (ref 12.0–15.0)
MCH: 27.1 pg (ref 26.0–34.0)
MCHC: 31.5 g/dL (ref 30.0–36.0)
MCV: 86 fL (ref 80.0–100.0)
Platelets: 358 10*3/uL (ref 150–400)
RBC: 4.13 MIL/uL (ref 3.87–5.11)
RDW: 13.5 % (ref 11.5–15.5)
WBC: 12.5 10*3/uL — ABNORMAL HIGH (ref 4.0–10.5)
nRBC: 0 % (ref 0.0–0.2)

## 2022-08-25 LAB — BASIC METABOLIC PANEL
Anion gap: 13 (ref 5–15)
BUN: 23 mg/dL (ref 8–23)
CO2: 25 mmol/L (ref 22–32)
Calcium: 9 mg/dL (ref 8.9–10.3)
Chloride: 105 mmol/L (ref 98–111)
Creatinine, Ser: 0.7 mg/dL (ref 0.44–1.00)
GFR, Estimated: >60 mL/min (ref >60)
Glucose, Bld: 78 mg/dL (ref 70–99)
Potassium: 3.6 mmol/L (ref 3.5–5.1)
Sodium: 143 mmol/L (ref 135–145)

## 2022-08-25 LAB — GLUCOSE, CAPILLARY
Glucose-Capillary: 50 mg/dL — ABNORMAL LOW (ref 70–99)
Glucose-Capillary: 162 mg/dL — ABNORMAL HIGH (ref 70–99)
Glucose-Capillary: 131 mg/dL — ABNORMAL HIGH (ref 70–99)
Glucose-Capillary: 145 mg/dL — ABNORMAL HIGH (ref 70–99)
Glucose-Capillary: 247 mg/dL — ABNORMAL HIGH (ref 70–99)

## 2022-08-25 LAB — MAGNESIUM: Magnesium: 2.3 mg/dL (ref 1.7–2.4)

## 2022-08-25 MED ORDER — INSULIN ASPART 100 UNIT/ML IJ SOLN
7.0000 [IU] | Freq: Three times a day (TID) | INTRAMUSCULAR | Status: DC
  Administered 2022-08-25 – 2022-08-26 (×3): 7 [IU] via SUBCUTANEOUS

## 2022-08-25 MED ORDER — HYDRALAZINE HCL 25 MG PO TABS
25.0000 mg | ORAL_TABLET | Freq: Three times a day (TID) | ORAL | Status: DC
  Administered 2022-08-25 – 2022-08-26 (×4): 25 mg via ORAL
  Filled 2022-08-25 (×4): qty 1

## 2022-08-25 MED ORDER — INSULIN GLARGINE-YFGN 100 UNIT/ML ~~LOC~~ SOLN
50.0000 [IU] | Freq: Every day | SUBCUTANEOUS | Status: DC
  Administered 2022-08-25 – 2022-08-26 (×2): 50 [IU] via SUBCUTANEOUS
  Filled 2022-08-25 (×2): qty 0.5

## 2022-08-25 NOTE — Progress Notes (Signed)
PROGRESS NOTE    Whitney Sloan  [id number] DOB: 09/14/1955 DOA: 08/20/2022 PCP: Nolene Ebbs, MD   Brief Narrative:  67 year old with past medical history significant for chronic diastolic heart failure, diabetes type 2, hypertension, hyperlipidemia presented with sepsis due to right lower extremity cellulitis.   Of note patient recently presented to [facility] on 08/17/2022 after a ground-level mechanical fall after which she sustained abrasion of the anterior aspect of the right lower extremity.  X-ray at that time was negative for fracture.  She was subsequently discharged home.  She returned the day of admission complaining of developing redness, increased warmth and swelling at the abrasion site.   Patient admitted for right lower extremity cellulitis. Fall, Gout/Pseudogout of BL knees, worsening left LE weakness which has improved. Plan to discharge to SNF when bed available.      Assessment & Plan:  Principal Problem:   Cellulitis of right lower extremity Active Problems:   Chronic diastolic CHF (congestive heart failure) ([place])   Essential hypertension   Hyperlipidemia   DM2 (diabetes mellitus, type 2) ([place])   Sepsis ([place])   Fall at home, initial encounter     Sepsis secondary to right lower extremity cellulitis: Right LE cellulitis  Sepsis physiology improving, initially on IV vancomycin and Rocephin.  Currently on Ancef.  Will transition to oral Keflex upon discharge.  Will plan for total of 7 days of antibiotics   Ground-level Mechanical Fall: PT recommend SNF   Bilateral Knee pain, worse right; Gout, Pseudo Gout.  Suspect gout, pseudogout.  Ortho consulted. Recommend Tx for gout /Pseudogout.  Treat with steroids/ day 3 prednisone/  Continue with colchicine and allopurinol.  X ray left knee negative. X ray right knee negative for fracture.  Pain improved, swelling improved.    Urinary incontinence for over a year.  Discussed with Daughter, patient will need  to be evaluated out patient by Urology.  Tx for UTI, UA with 21-50 WBC.  She has been able to urinate  Urine culture; No growth to date.    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  Worsening left LE weakness for last 3 days per family.  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  Weakness improved, she is able to move left leg better.    Fecal incontinence.  Could be related to inability to ambulate independent.  Needs follow up with GI out patient.  She has been able to tell staff when she needs to have Bowel movement.    Chronic Diastolic HF Resume Lasix. Coreg, losartan   Diabetes Type 2: Hyperglycemia, uncontrolled.  Blood sugars are very labile.  Hypoglycemic episode this morning but mostly asymptomatic.  Will reduce Semglee and Premeal insulin.     HTN; Continue with Carvedilol and Losartan   Hyperlipidemia:  Continue with Zetia and lipitor.    Acute metabolic encephalopathy; Per daughter patient has been more lethargic, sleep at home.  She wonder if she has sleep apnea. I advised patient needs to follow up with PCP for sleep study. MRI Brain is neg.  - B 12 low, TSH normal , Ammonia normal.  ABG Oxygen low at 70   Acute hypoxia; placed on 2 L oxygen. Chest x ray negative for pulmonary edema.    B 12 Deficiency; 153. Started B 12 supplement.    Hypokalemia; Replaced.  Started oral supplement.  Estimated body mass index is 35.21 kg/m as calculated from the following:   Height as of this encounter: '5\' 7"'$  (1.702 m).   Weight as of this encounter: 102 kg.     DVT prophylaxis: Lovenox Code Status: Full code Family Communication:  SIL at bedside Disposition Plan:  Status is: Inpatient Remains inpatient appropriate because: management LE cellulitis. SNF placement.       Subjective:  Hypoglycemic episode but largely remained  asymptomatic.  When I saw her at bedside she did not have any other complaints Examination: Constitutional: Not in acute distress Respiratory: Clear to auscultation bilaterally Cardiovascular: Normal sinus rhythm, no rubs Abdomen: Nontender nondistended good bowel sounds Musculoskeletal: No edema noted Skin: No rashes seen Neurologic: CN 2-12 grossly intact.  And nonfocal Psychiatric: Normal judgment and insight. Alert and oriented x 3. Normal mood.  Objective: Vitals:   08/24/22 2058 08/25/22 0438 08/25/22 0500 08/25/22 1138  BP: (!) 153/74 (!) 165/73  (!) 147/86  Pulse: 61 71  64  Resp:  18  20  Temp:  98.4 F (36.9 C)  98.1 F (36.7 C)  TempSrc:  Oral  Oral  SpO2: 99% 95%  97%  Weight:   104.3 kg   Height:        Intake/Output Summary (Last 24 hours) at 08/25/2022 1149 Last data filed at 08/24/2022 2100 Gross per 24 hour  Intake 480 ml  Output 600 ml  Net -120 ml   Filed Weights   08/23/22 0257 08/24/22 0559 08/25/22 0500  Weight: 102 kg 104.1 kg 104.3 kg     Data Reviewed:   CBC: Recent Labs  Lab 08/20/22 2015 08/21/22 0018 08/22/22 0742 08/23/22 0442 08/24/22 0911 08/25/22 0451  WBC 14.6* 13.8* 12.6* 15.4* 13.4* 12.5*  NEUTROABS 10.2* 9.4*  --   --   --   --   HGB 12.0 12.2 10.4* 10.4* 10.9* 11.2*  HCT 38.8 38.9 32.9* 33.0* 34.8* 35.5*  MCV 86.6 86.1 85.2 84.8 85.5 86.0  PLT 286 302 279 294 329 [id number]   Basic Metabolic Panel: Recent Labs  Lab 08/21/22 0017 08/21/22 0018 08/22/22 0742 08/23/22 0442 08/24/22 0911 08/25/22 0451  NA  --  138 133* 136 141 143  K  --  4.4 3.2* 3.5 3.2* 3.6  CL  --  98 97* 99 104 105  CO2  --  '29 26 25 27 25  '$ GLUCOSE  --  137* 337* 271* 159* 78  BUN  --  20 30* 37* 26* 23  CREATININE  --  0.83 1.00 0.94 0.78 0.70  CALCIUM  --  9.0 8.4* 8.8* 8.7* 9.0  MG 1.9  --  2.1 2.3  --  2.3   GFR: Estimated Creatinine Clearance: 84.8 mL/min (by C-G formula based on SCr of 0.7 mg/dL). Liver Function Tests: Recent Labs  Lab  08/20/22 2015 08/21/22 0018  AST 24 14*  ALT 13 9  ALKPHOS 67 71  BILITOT 1.0 0.5  PROT 7.7 7.9  ALBUMIN 3.2* 3.2*   No results for input(s): "LIPASE", "AMYLASE" in the last 168 hours. Recent Labs  Lab 08/22/22 0047  AMMONIA 26   Coagulation Profile: No results for input(s): "INR", "PROTIME" in the last 168 hours. Cardiac Enzymes: No results for input(s): "CKTOTAL", "CKMB", "CKMBINDEX", "TROPONINI" in the last 168 hours. BNP (last 3 results) No results for input(s): "PROBNP" in the last 8760 hours. HbA1C: No results for input(s): "HGBA1C" in the last 72 hours. CBG: Recent Labs  Lab 08/24/22 2056 08/24/22  2316 08/25/22 0743 08/25/22 0849 08/25/22 1128  GLUCAP 251* 220* 50* 162* 131*   Lipid Profile: No results for input(s): "CHOL", "HDL", "LDLCALC", "TRIG", "CHOLHDL", "LDLDIRECT" in the last 72 hours. Thyroid Function Tests: No results for input(s): "TSH", "T4TOTAL", "FREET4", "T3FREE", "THYROIDAB" in the last 72 hours.  Anemia Panel: No results for input(s): "VITAMINB12", "FOLATE", "FERRITIN", "TIBC", "IRON", "RETICCTPCT" in the last 72 hours.  Sepsis Labs: Recent Labs  Lab 08/20/22 2015  LATICACIDVEN 1.4    Recent Results (from the past 240 hour(s))  Culture, blood (single)     Status: None (Preliminary result)   Collection Time: 08/20/22  8:15 PM   Specimen: BLOOD  Result Value Ref Range Status   Specimen Description   Final    BLOOD LEFT ANTECUBITAL Performed at [facility] [address] [place], [place] [address]    Special Requests   Final    BOTTLES DRAWN AEROBIC AND ANAEROBIC Blood Culture results may not be optimal due to an inadequate volume of blood received in culture bottles Performed at [facility] [address] [place], [place] [address]    Culture   Final    NO GROWTH 4 DAYS Performed at [facility], [place] [address], [place], [place] [address]    Report Status PENDING  Incomplete   Culture, blood (single)     Status: None (Preliminary result)   Collection Time: 08/20/22 10:16 PM   Specimen: BLOOD  Result Value Ref Range Status   Specimen Description   Final    BLOOD Performed at [facility] [address] [place], [place] [address]    Special Requests   Final    BOTTLES DRAWN AEROBIC AND ANAEROBIC Blood Culture results may not be optimal due to an inadequate volume of blood received in culture bottles Performed at [facility] [address], [place], [place] [address]    Culture   Final    NO GROWTH 4 DAYS Performed at [facility], [place] [address], [place], [place] [address]    Report Status PENDING  Incomplete  Urine Culture (for pregnant, neutropenic or urologic patients or patients with an indwelling urinary catheter)     Status: None   Collection Time: 08/20/22 11:16 PM   Specimen: Urine, Clean Catch  Result Value Ref Range Status   Specimen Description   Final    URINE, CLEAN CATCH Performed at [facility], [place] [address] [place], [place] [address]    Special Requests   Final    NONE Performed at [facility], [place] [address] [place], [place] [address]    Culture   Final    NO GROWTH Performed at [facility], [place] [address] [place], [place] [address]    Report Status 08/22/2022 FINAL  Final  Urine Culture (for pregnant, neutropenic or urologic patients or patients with an indwelling urinary catheter)     Status: None   Collection Time: 08/22/22  4:59 AM   Specimen: Urine, Clean Catch  Result Value Ref Range Status   Specimen Description   Final    URINE, CLEAN CATCH Performed at [facility], [place] [address], [place], [place] [address]    Special Requests   Final    NONE Performed at [facility], [place] [address], [place], [place] [address]    Culture   Final    NO GROWTH Performed at [facility],  [address] [place], [place] [address]    Report Status 08/23/2022 FINAL  Final         Radiology Studies: No results found.      Scheduled Meds:  allopurinol  100 mg Oral Daily   aspirin EC  81 mg Oral BH-q7a   atorvastatin  20 mg Oral QPM   carvedilol  25 mg Oral BID WC   colchicine  0.6 mg Oral Daily   cyanocobalamin  1,000 mcg Intramuscular Daily   dapagliflozin propanediol  10 mg Oral QAC breakfast   enoxaparin (LOVENOX) injection  40 mg Subcutaneous Daily   ezetimibe  10 mg Oral QHS   furosemide  20 mg Oral Daily   hydrALAZINE  25 mg Oral Q8H   insulin aspart  0-15 Units Subcutaneous TID WC   insulin aspart  7 Units Subcutaneous TID WC   insulin glargine-yfgn  50 Units Subcutaneous Daily   losartan  100 mg Oral Daily   potassium chloride  20 mEq Oral Daily   Continuous Infusions:   ceFAZolin (ANCEF) IV 2 g (08/25/22 0555)     LOS: 5 days   Time spent= 35 mins    Keldric Poyer Arsenio Loader, MD [facility]  If 7PM-7AM, please contact night-coverage  08/25/2022, 11:49 AM

## 2022-08-26 DIAGNOSIS — L03115 Cellulitis of right lower limb: Secondary | ICD-10-CM | POA: Diagnosis not present

## 2022-08-26 LAB — CBC
HCT: 35.2 % — ABNORMAL LOW (ref 36.0–46.0)
Hemoglobin: 11.1 g/dL — ABNORMAL LOW (ref 12.0–15.0)
MCH: 27 pg (ref 26.0–34.0)
MCHC: 31.5 g/dL (ref 30.0–36.0)
MCV: 85.6 fL (ref 80.0–100.0)
Platelets: 342 10*3/uL (ref 150–400)
RBC: 4.11 MIL/uL (ref 3.87–5.11)
RDW: 13.5 % (ref 11.5–15.5)
WBC: 11.5 10*3/uL — ABNORMAL HIGH (ref 4.0–10.5)
nRBC: 0 % (ref 0.0–0.2)

## 2022-08-26 LAB — GLUCOSE, CAPILLARY
Glucose-Capillary: 83 mg/dL (ref 70–99)
Glucose-Capillary: 98 mg/dL (ref 70–99)
Glucose-Capillary: 120 mg/dL — ABNORMAL HIGH (ref 70–99)
Glucose-Capillary: 168 mg/dL — ABNORMAL HIGH (ref 70–99)

## 2022-08-26 LAB — CULTURE, BLOOD (SINGLE)
Culture: NO GROWTH
Culture: NO GROWTH

## 2022-08-26 LAB — BASIC METABOLIC PANEL
Anion gap: 11 (ref 5–15)
BUN: 22 mg/dL (ref 8–23)
CO2: 24 mmol/L (ref 22–32)
Calcium: 8.4 mg/dL — ABNORMAL LOW (ref 8.9–10.3)
Chloride: 106 mmol/L (ref 98–111)
Creatinine, Ser: 0.65 mg/dL (ref 0.44–1.00)
GFR, Estimated: >60 mL/min (ref >60)
Glucose, Bld: 94 mg/dL (ref 70–99)
Potassium: 3.8 mmol/L (ref 3.5–5.1)
Sodium: 141 mmol/L (ref 135–145)

## 2022-08-26 LAB — MAGNESIUM: Magnesium: 2.1 mg/dL (ref 1.7–2.4)

## 2022-08-26 MED ORDER — INSULIN ASPART 100 UNIT/ML IJ SOLN
6.0000 [IU] | Freq: Every day | INTRAMUSCULAR | Status: DC
  Administered 2022-08-26 – 2022-08-28 (×3): 6 [IU] via SUBCUTANEOUS

## 2022-08-26 MED ORDER — INSULIN GLARGINE-YFGN 100 UNIT/ML ~~LOC~~ SOLN
42.0000 [IU] | Freq: Every day | SUBCUTANEOUS | Status: DC
  Administered 2022-08-27 – 2022-08-29 (×3): 42 [IU] via SUBCUTANEOUS
  Filled 2022-08-26 (×3): qty 0.42

## 2022-08-26 MED ORDER — INSULIN ASPART 100 UNIT/ML IJ SOLN: 7.0000 [IU] | Freq: Two times a day (BID) | INTRAMUSCULAR | Status: DC

## 2022-08-26 MED ORDER — INSULIN ASPART 100 UNIT/ML IJ SOLN
8.0000 [IU] | Freq: Two times a day (BID) | INTRAMUSCULAR | Status: DC
  Administered 2022-08-26 – 2022-08-29 (×7): 8 [IU] via SUBCUTANEOUS

## 2022-08-26 MED ORDER — HYDRALAZINE HCL 50 MG PO TABS
50.0000 mg | ORAL_TABLET | Freq: Three times a day (TID) | ORAL | Status: DC
  Administered 2022-08-26 – 2022-08-28 (×6): 50 mg via ORAL
  Filled 2022-08-26 (×6): qty 1

## 2022-08-26 NOTE — Progress Notes (Signed)
Physical Therapy Treatment Patient Details Name: Whitney Sloan MRN: [id number] DOB: 1956/05/16 Today's Date: 08/26/2022   History of Present Illness 67 yo female admitted with cellultis, sepsis, fall at home, c/o bil LE pain. (-) fx, (+) joint effusion, pseudogout? per ortho consult. Hx of DM, CHF, morbid obesity    PT Comments    Pt pleasant and agreeable to mobilize.  Pt appears hesitant however due to her previous pain and inability to ambulate.  Pt encouraged with starting with standing and then able to march in place without major increase in pain.  Pt felt comfortable with initiating ambulation and able to ambulate 40 ft with RW today!  Pt would prefer to d/c home so recommended she discuss family ability to assist her upon d/c.  If family cannot provide current assist, SNF remains appropriate.    Recommendations for follow up therapy are one component of a multi-disciplinary discharge planning process, led by the attending physician.  Recommendations may be updated based on patient status, additional functional criteria and insurance authorization.  Follow Up Recommendations  Skilled nursing-short term rehab (<3 hours/day) Can patient physically be transported by private vehicle: Yes   Assistance Recommended at Discharge Intermittent Supervision/Assistance  Patient can return home with the following A little help with walking and/or transfers;A little help with bathing/dressing/bathroom;Assistance with cooking/housework;Assist for transportation;Help with stairs or ramp for entrance   Equipment Recommendations  Rolling walker (2 wheels)    Recommendations for Other Services       Precautions / Restrictions Precautions Precautions: Fall     Mobility  Bed Mobility               General bed mobility comments: pt in recliner    Transfers Overall transfer level: Needs assistance Equipment used: Rolling walker (2 wheels) Transfers: Sit to/from Stand Sit to Stand: Min  guard           General transfer comment: verbal cues for hand placement, min/guard for safety    Ambulation/Gait Ambulation/Gait assistance: Min guard Gait Distance (Feet): 40 Feet Assistive device: Rolling walker (2 wheels) Gait Pattern/deviations: Step-through pattern, Decreased stride length       General Gait Details: small short steps, pt reports pain still present but much improved and she is able to ambulate today, cues for use of RW, distance per pt comfort   Stairs             Wheelchair Mobility    Modified Rankin (Stroke Patients Only)       Balance                                            Cognition Arousal/Alertness: Awake/alert Behavior During Therapy: WFL for tasks assessed/performed Overall Cognitive Status: Within Functional Limits for tasks assessed                                          Exercises      General Comments        Pertinent Vitals/Pain Pain Assessment Pain Assessment: 0-10 Pain Score: 3  Pain Location: bil LEs Pain Descriptors / Indicators: Discomfort, Sore Pain Intervention(s): Repositioned, Monitored during session    Home Living  Prior Function            PT Goals (current goals can now be found in the care plan section) Progress towards PT goals: Progressing toward goals    Frequency    Min 2X/week      PT Plan Current plan remains appropriate    Co-evaluation              AM-PAC PT "6 Clicks" Mobility   Outcome Measure  Help needed turning from your back to your side while in a flat bed without using bedrails?: A Little Help needed moving from lying on your back to sitting on the side of a flat bed without using bedrails?: A Little Help needed moving to and from a bed to a chair (including a wheelchair)?: A Little Help needed standing up from a chair using your arms (e.g., wheelchair or bedside chair)?: A  Little Help needed to walk in hospital room?: A Little Help needed climbing 3-5 steps with a railing? : A Lot 6 Click Score: 17    End of Session Equipment Utilized During Treatment: Gait belt Activity Tolerance: Patient tolerated treatment well Patient left: in chair;with call bell/phone within reach;with chair alarm set Nurse Communication: Mobility status PT Visit Diagnosis: Muscle weakness (generalized) (M62.81);Difficulty in walking, not elsewhere classified (R26.2)     Time: [id number] PT Time Calculation (min) (ACUTE ONLY): 11 min  Charges:  $Gait Training: 8-22 mins                    Jannette Spanner PT, DPT Physical Therapist Acute Rehabilitation Services Preferred contact method: Secure Chat Weekend Pager Only: [phone] Office: [place] 08/26/2022, 3:01 PM

## 2022-08-26 NOTE — Progress Notes (Signed)
PROGRESS NOTE    Whitney Sloan  [id number] DOB: 06/27/1955 DOA: 08/20/2022 PCP: Nolene Ebbs, MD   Brief Narrative:  67 year old with past medical history significant for chronic diastolic heart failure, diabetes type 2, hypertension, hyperlipidemia presented with sepsis due to right lower extremity cellulitis.   Of note patient recently presented to [facility] on 08/17/2022 after a ground-level mechanical fall after which she sustained abrasion of the anterior aspect of the right lower extremity.  X-ray at that time was negative for fracture.  She was subsequently discharged home.  She returned the day of admission complaining of developing redness, increased warmth and swelling at the abrasion site.   Patient admitted for right lower extremity cellulitis. Fall, Gout/Pseudogout of BL knees, worsening left LE weakness which has improved. Plan to discharge to SNF when bed available.      Assessment & Plan:  Principal Problem:   Cellulitis of right lower extremity Active Problems:   Chronic diastolic CHF (congestive heart failure) ([place])   Essential hypertension   Hyperlipidemia   DM2 (diabetes mellitus, type 2) ([place])   Sepsis ([place])   Fall at home, initial encounter     Sepsis secondary to right lower extremity cellulitis: Right LE cellulitis  Sepsis physiology improving, initially on IV vancomycin and Rocephin.  Currently on Ancef.  Will transition to oral Keflex upon discharge.  Will plan for total of 7 days of antibiotics, Today would be the last day.    Ground-level Mechanical Fall: PT recommend SNF   Bilateral Knee pain, worse right; Gout, Pseudo Gout. Improved.  Suspect gout, pseudogout.  Ortho consulted. Recommend Tx for gout /Pseudogout.  Finished 3 days of prednisone.  Continue with colchicine and allopurinol.  X ray left knee negative. X ray right knee negative for fracture.  Pain improved, swelling improved.    Urinary incontinence for over a year.  Discussed  with Daughter, patient will need to be evaluated out patient by Urology.  Tx for UTI, UA with 21-50 WBC.  She has been able to urinate  Urine culture; No growth to date.    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 MRI 3/10 neg for stroke here. It has improved since.    Fecal incontinence.  Could be related to inability to ambulate independent.  Needs follow up with GI out patient.  She has been able to tell staff when she needs to have Bowel movement.    Chronic Diastolic HF Resume Lasix. Coreg, losartan   Diabetes Type 2: Hyperglycemia, uncontrolled.  Blood sugars are very labile.  BS >120 all day yesterday, this morning 83.  Cont Semglee and reduce Premeal insulin >Change to Bfast and lunch only.  Will discuss with diabetic coordinator to see if she will be a good candidate for 70/30     HTN; Continue with Carvedilol and Losartan   Hyperlipidemia:  Continue with Zetia and lipitor.    Acute metabolic encephalopathy; Per daughter patient has been more lethargic, sleep at home.  She wonder if she has sleep apnea. I advised patient needs to follow up with PCP for sleep study. MRI Brain is neg.  - B 12 low, TSH normal , Ammonia normal.  ABG Oxygen low at 70   Acute hypoxia; Resolved on RA now.,    B 12 Deficiency; 153. Started B 12 supplement.    Hypokalemia; Replaced.  Started oral supplement.      Estimated body mass index is 35.21 kg/m as calculated  from the following:   Height as of this encounter: 5\' 7"  (1.702 m).   Weight as of this encounter: 102 kg.     DVT prophylaxis: Lovenox Code Status: Full code Family Communication:  SIL at bedside Disposition Plan:  Status is: Inpatient Remains inpatient appropriate because: management LE cellulitis. SNF placement.       Subjective: Sitting up in the chair, no complaints.   Examination: Constitutional: Not in acute distress Respiratory: Clear to auscultation  bilaterally Cardiovascular: Normal sinus rhythm, no rubs Abdomen: Nontender nondistended good bowel sounds Musculoskeletal: No edema noted Skin: No rashes seen Neurologic: CN 2-12 grossly intact.  And nonfocal Psychiatric: Normal judgment and insight. Alert and oriented x 3. Normal mood.    Objective: Vitals:   08/25/22 1138 08/25/22 2042 08/26/22 0500 08/26/22 0536  BP: (!) 147/86 (!) 145/66  (!) 148/73  Pulse: 64 64  67  Resp: 20 18  18   Temp: 98.1 F (36.7 C) (!) 97.3 F (36.3 C)  97.8 F (36.6 C)  TempSrc: Oral Oral  Oral  SpO2: 97% 99%  98%  Weight:   103.8 kg   Height:        Intake/Output Summary (Last 24 hours) at 08/26/2022 0810 Last data filed at 08/25/2022 2045 Gross per 24 hour  Intake 1840 ml  Output 200 ml  Net 1640 ml   Filed Weights   08/24/22 0559 08/25/22 0500 08/26/22 0500  Weight: 104.1 kg 104.3 kg 103.8 kg     Data Reviewed:   CBC: Recent Labs  Lab 08/20/22 2015 08/21/22 0018 08/22/22 0742 08/23/22 0442 08/24/22 0911 08/25/22 0451 08/26/22 0543  WBC 14.6* 13.8* 12.6* 15.4* 13.4* 12.5* 11.5*  NEUTROABS 10.2* 9.4*  --   --   --   --   --   HGB 12.0 12.2 10.4* 10.4* 10.9* 11.2* 11.1*  HCT 38.8 38.9 32.9* 33.0* 34.8* 35.5* 35.2*  MCV 86.6 86.1 85.2 84.8 85.5 86.0 85.6  PLT 286 302 279 294 329 358 [id number]   Basic Metabolic Panel: Recent Labs  Lab 08/21/22 0017 08/21/22 0018 08/22/22 0742 08/23/22 0442 08/24/22 0911 08/25/22 0451 08/26/22 0543  NA  --    < > 133* 136 141 143 141  K  --    < > 3.2* 3.5 3.2* 3.6 3.8  CL  --    < > 97* 99 104 105 106  CO2  --    < > 26 25 27 25 24   GLUCOSE  --    < > 337* 271* 159* 78 94  BUN  --    < > 30* 37* 26* 23 22  CREATININE  --    < > 1.00 0.94 0.78 0.70 0.65  CALCIUM  --    < > 8.4* 8.8* 8.7* 9.0 8.4*  MG 1.9  --  2.1 2.3  --  2.3 2.1   < > = values in this interval not displayed.   GFR: Estimated Creatinine Clearance: 84.6 mL/min (by C-G formula based on SCr of 0.65 mg/dL). Liver  Function Tests: Recent Labs  Lab 08/20/22 2015 08/21/22 0018  AST 24 14*  ALT 13 9  ALKPHOS 67 71  BILITOT 1.0 0.5  PROT 7.7 7.9  ALBUMIN 3.2* 3.2*   No results for input(s): "LIPASE", "AMYLASE" in the last 168 hours. Recent Labs  Lab 08/22/22 0047  AMMONIA 26   Coagulation Profile: No results for input(s): "INR", "PROTIME" in the last 168 hours. Cardiac Enzymes: No results for input(s): "CKTOTAL", "  CKMB", "CKMBINDEX", "TROPONINI" in the last 168 hours. BNP (last 3 results) No results for input(s): "PROBNP" in the last 8760 hours. HbA1C: No results for input(s): "HGBA1C" in the last 72 hours. CBG: Recent Labs  Lab 08/25/22 0849 08/25/22 1128 08/25/22 1715 08/25/22 2038 08/26/22 0740  GLUCAP 162* 131* 145* 247* 83   Lipid Profile: No results for input(s): "CHOL", "HDL", "LDLCALC", "TRIG", "CHOLHDL", "LDLDIRECT" in the last 72 hours. Thyroid Function Tests: No results for input(s): "TSH", "T4TOTAL", "FREET4", "T3FREE", "THYROIDAB" in the last 72 hours.  Anemia Panel: No results for input(s): "VITAMINB12", "FOLATE", "FERRITIN", "TIBC", "IRON", "RETICCTPCT" in the last 72 hours.  Sepsis Labs: Recent Labs  Lab 08/20/22 2015  LATICACIDVEN 1.4    Recent Results (from the past 240 hour(s))  Culture, blood (single)     Status: None (Preliminary result)   Collection Time: 08/20/22  8:15 PM   Specimen: BLOOD  Result Value Ref Range Status   Specimen Description   Final    BLOOD LEFT ANTECUBITAL Performed at [facility] [address] [place], [place] [address]    Special Requests   Final    BOTTLES DRAWN AEROBIC AND ANAEROBIC Blood Culture results may not be optimal due to an inadequate volume of blood received in culture bottles Performed at [facility] [address], [place], [place] [address]    Culture   Final    NO GROWTH 4 DAYS Performed at [facility], [place] [address] [place], [place] [address]     Report Status PENDING  Incomplete  Culture, blood (single)     Status: None (Preliminary result)   Collection Time: 08/20/22 10:16 PM   Specimen: BLOOD  Result Value Ref Range Status   Specimen Description   Final    BLOOD Performed at [facility] [address], [place], [place] [address]    Special Requests   Final    BOTTLES DRAWN AEROBIC AND ANAEROBIC Blood Culture results may not be optimal due to an inadequate volume of blood received in culture bottles Performed at [facility] [address] [place], [place] [address]    Culture   Final    NO GROWTH 4 DAYS Performed at [facility], [place] [address], [place], [place] [address]    Report Status PENDING  Incomplete  Urine Culture (for pregnant, neutropenic or urologic patients or patients with an indwelling urinary catheter)     Status: None   Collection Time: 08/20/22 11:16 PM   Specimen: Urine, Clean Catch  Result Value Ref Range Status   Specimen Description   Final    URINE, CLEAN CATCH Performed at [facility], [place] [address] [place], [place] [address]    Special Requests   Final    NONE Performed at [facility], [place] [address], [place], [place] [address]    Culture   Final    NO GROWTH Performed at [facility], [place] [address], [place], [place] [address]    Report Status 08/22/2022 FINAL  Final  Urine Culture (for pregnant, neutropenic or urologic patients or patients with an indwelling urinary catheter)     Status: None   Collection Time: 08/22/22  4:59 AM   Specimen: Urine, Clean Catch  Result Value Ref Range Status   Specimen Description   Final    URINE, CLEAN CATCH Performed at [facility], [place] [address] [place], [place] [address]  Special Requests   Final    NONE Performed at [facility], [place] [address] [place], [place] [address]    Culture   Final    NO  GROWTH Performed at [facility], [place] [address] [place], [place] [address]    Report Status 08/23/2022 FINAL  Final         Radiology Studies: No results found.      Scheduled Meds:  allopurinol  100 mg Oral Daily   aspirin EC  81 mg Oral BH-q7a   atorvastatin  20 mg Oral QPM   carvedilol  25 mg Oral BID WC   colchicine  0.6 mg Oral Daily   cyanocobalamin  1,000 mcg Intramuscular Daily   dapagliflozin propanediol  10 mg Oral QAC breakfast   enoxaparin (LOVENOX) injection  40 mg Subcutaneous Daily   ezetimibe  10 mg Oral QHS   furosemide  20 mg Oral Daily   hydrALAZINE  25 mg Oral Q8H   insulin aspart  0-15 Units Subcutaneous TID WC   insulin aspart  7 Units Subcutaneous BID WC   insulin glargine-yfgn  50 Units Subcutaneous Daily   losartan  100 mg Oral Daily   potassium chloride  20 mEq Oral Daily   Continuous Infusions:   ceFAZolin (ANCEF) IV 2 g (08/26/22 0525)     LOS: 6 days   Time spent= 35 mins    Korey Arroyo Arsenio Loader, MD [facility]  If 7PM-7AM, please contact night-coverage  08/26/2022, 8:10 AM

## 2022-08-26 NOTE — TOC Progression Note (Addendum)
Transition of Care [facility]) - Progression Note    Patient Details  Name: Whitney Sloan MRN: [id number] Date of Birth: [birth year]  [facility] CM/SW Contact  Lakedra Washington, Juliann Pulse, RN Phone Number: 08/26/2022, 3:05 PM  Clinical Narrative: Patient/dtr chose [facility] I will initiate auth.  -3:39p- [facility]-can accept on Monday-initiate auth Sunday. -auth to be initiated tomorrow.    Expected Discharge Plan: [place] Barriers to Discharge: Continued Medical Work up  Expected Discharge Plan and Services                                               Social Determinants of Health (SDOH) Interventions SDOH Screenings   Food Insecurity: No Food Insecurity (08/21/2022)  Housing: Low Risk  (08/21/2022)  Transportation Needs: No Transportation Needs (08/21/2022)  Utilities: Not At Risk (08/21/2022)  Depression (PHQ2-9): Low Risk  (03/24/2021)  Tobacco Use: Low Risk  (08/20/2022)    Readmission Risk Interventions    09/02/2021    3:21 PM  Readmission Risk Prevention Plan  Post Dischage Appt Complete  Medication Screening Complete  Transportation Screening Complete

## 2022-08-26 NOTE — Inpatient Diabetes Management (Signed)
Inpatient Diabetes Program Recommendations  AACE/ADA: New Consensus Statement on Inpatient Glycemic Control (2015)  Target Ranges:  Prepandial:   less than 140 mg/dL      Peak postprandial:   less than 180 mg/dL (1-2 hours)      Critically ill patients:  140 - 180 mg/dL   Lab Results  Component Value Date   GLUCAP 98 08/26/2022   HGBA1C 13.9 (H) 08/21/2022    Review of Glycemic Control  Diabetes history: DM2 Outpatient Diabetes medications: None since January 2024; previously on Tresiba 80 QD, Novolog 20 TID, metformin 500QD and Farxiga 10 QD Current orders for Inpatient glycemic control: Semglee 42 units QD, Novolog 0-15 TID  + 8-8-6 units TID  HgbA1C - 13.9%  Inpatient Diabetes Program Recommendations:    Agree with orders.  Spoke with pt and dtr at bedside regarding poor glycemic control and HgbA1C of 13.9%. Pt states she quit taking all of her diabetes meds back in Jan 2024 because she just got tired of taking them. Has not been following healthy diet even though daughter has been cooking good meals for her. Eating a large meal at 10 pm and nothing else during the day. Discussed importance of getting back on track with taking care of herself. Discussed glucose and HgbA1C goals. DIscussed taking meds as prescribed, monitoring, healthy eating and trying to get a little exercise to help with her blood sugar control. Discussed risk of complications from uncontrolled DM. Discussed hypoglycemia s/s and treatment. Pt states she is willing to start taking meds again and try to get her diabetes under control. Will f/u with PCP. Encouraged her to take meter so PCP can review results and make changes to insulin if needed. Will order Living Well with Diabetes book for reference. Pt and dtr appreciative of visit.   Secure text to MD Discussed with RN.  Will follow.  Thank you. Lorenda Peck, RD, LDN, [place] Inpatient Diabetes Coordinator [phone]

## 2022-08-27 DIAGNOSIS — L03115 Cellulitis of right lower limb: Secondary | ICD-10-CM | POA: Diagnosis not present

## 2022-08-27 LAB — BASIC METABOLIC PANEL
Anion gap: 7 (ref 5–15)
BUN: 20 mg/dL (ref 8–23)
CO2: 26 mmol/L (ref 22–32)
Calcium: 8.3 mg/dL — ABNORMAL LOW (ref 8.9–10.3)
Chloride: 108 mmol/L (ref 98–111)
Creatinine, Ser: 0.69 mg/dL (ref 0.44–1.00)
GFR, Estimated: >60 mL/min (ref >60)
Glucose, Bld: 101 mg/dL — ABNORMAL HIGH (ref 70–99)
Potassium: 3.8 mmol/L (ref 3.5–5.1)
Sodium: 141 mmol/L (ref 135–145)

## 2022-08-27 LAB — GLUCOSE, CAPILLARY
Glucose-Capillary: 102 mg/dL — ABNORMAL HIGH (ref 70–99)
Glucose-Capillary: 155 mg/dL — ABNORMAL HIGH (ref 70–99)
Glucose-Capillary: 129 mg/dL — ABNORMAL HIGH (ref 70–99)
Glucose-Capillary: 138 mg/dL — ABNORMAL HIGH (ref 70–99)
Glucose-Capillary: 148 mg/dL — ABNORMAL HIGH (ref 70–99)

## 2022-08-27 LAB — CBC
HCT: 33.9 % — ABNORMAL LOW (ref 36.0–46.0)
Hemoglobin: 10.7 g/dL — ABNORMAL LOW (ref 12.0–15.0)
MCH: 27.3 pg (ref 26.0–34.0)
MCHC: 31.6 g/dL (ref 30.0–36.0)
MCV: 86.5 fL (ref 80.0–100.0)
Platelets: 327 10*3/uL (ref 150–400)
RBC: 3.92 MIL/uL (ref 3.87–5.11)
RDW: 13.8 % (ref 11.5–15.5)
WBC: 10.3 10*3/uL (ref 4.0–10.5)
nRBC: 0 % (ref 0.0–0.2)

## 2022-08-27 LAB — MAGNESIUM: Magnesium: 2.3 mg/dL (ref 1.7–2.4)

## 2022-08-27 MED ORDER — CEFAZOLIN SODIUM-DEXTROSE 2-4 GM/100ML-% IV SOLN
2.0000 g | Freq: Three times a day (TID) | INTRAVENOUS | Status: AC
  Administered 2022-08-27 (×2): 2 g via INTRAVENOUS
  Filled 2022-08-27 (×2): qty 100

## 2022-08-27 NOTE — Progress Notes (Signed)
Agree with previous RN assessment, assumed care of patient

## 2022-08-27 NOTE — Progress Notes (Signed)
Mobility Specialist - Progress Note   08/27/22 1114  Mobility  Activity Ambulated with assistance in hallway;Ambulated with assistance to bathroom  Level of Assistance Standby assist, set-up cues, supervision of patient - no hands on  Assistive Device Front wheel walker  Distance Ambulated (ft) 40 ft  Activity Response Tolerated well  Mobility Referral Yes  $Mobility charge 1 Mobility   Pt received in recliner and agreeable to mobility. No complaints during session. Upon returning to room, pt requested assistance to BR. Pt to recliner after session with all needs met.   [facility]

## 2022-08-27 NOTE — Progress Notes (Signed)
PROGRESS NOTE    Whitney Sloan  [id number] DOB: March 23, 1956 DOA: 08/20/2022 PCP: Nolene Ebbs, MD   Brief Narrative:  67 year old with past medical history significant for chronic diastolic heart failure, diabetes type 2, hypertension, hyperlipidemia presented with sepsis due to right lower extremity cellulitis.   Of note patient recently presented to [facility] on 08/17/2022 after a ground-level mechanical fall after which she sustained abrasion of the anterior aspect of the right lower extremity.  X-ray at that time was negative for fracture.  She was subsequently discharged home.  She returned the day of admission complaining of developing redness, increased warmth and swelling at the abrasion site.   Patient admitted for right lower extremity cellulitis. Fall, Gout/Pseudogout of BL knees, worsening left LE weakness which has improved. Plan to discharge to SNF when bed available.    Requested TOC to initiate authorization.  Assessment & Plan:  Principal Problem:   Cellulitis of right lower extremity Active Problems:   Chronic diastolic CHF (congestive heart failure) ([place])   Essential hypertension   Hyperlipidemia   DM2 (diabetes mellitus, type 2) ([place])   Sepsis ([place])   Fall at home, initial encounter     Sepsis secondary to right lower extremity cellulitis: Right LE cellulitis  Sepsis physiology improving, initially on IV vancomycin and Rocephin.  Currently on Ancef.  Today is her last day of antibiotics.   Ground-level Mechanical Fall: PT recommend SNF   Bilateral Knee pain, worse right; Gout, Pseudo Gout. Improved.  Suspect gout, pseudogout.  Ortho consulted. Recommend Tx for gout /Pseudogout.  Finished 3 days of prednisone.  Continue with colchicine and allopurinol.  X ray left knee negative. X ray right knee negative for fracture.  Pain improved, swelling improved.    Urinary incontinence for over a year.  Discussed with Daughter, patient will need to be  evaluated out patient by Urology.  Tx for UTI, UA with 21-50 WBC.  She has been able to urinate  Urine culture; No growth to date.    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 MRI 3/10 neg for stroke here. It has improved since.    Fecal incontinence.  Could be related to inability to ambulate independent.  Needs follow up with GI out patient.  She has been able to tell staff when she needs to have Bowel movement.    Chronic Diastolic HF Resume Lasix. Coreg, losartan   Diabetes Type 2: Hyperglycemia, uncontrolled.  Blood sugars are very labile.  Blood glucose has improved.  Today we will continue Semglee 42 units daily, NovoLog 8 units before breakfast and lunch and 6 units before dinner.     HTN; Continue with Carvedilol and Losartan   Hyperlipidemia:  Continue with Zetia and lipitor.    Acute metabolic encephalopathy; Resolved.  MRI brain negative - B 12 low, TSH normal , Ammonia normal.  ABG Oxygen low at 70   Acute hypoxia; Resolved on RA now.,    B 12 Deficiency; 153. Started B 12 supplement.    Hypokalemia; Replaced.  Started oral supplement.      Estimated body mass index is 35.21 kg/m as calculated from the following:   Height as of this encounter: 5\' 7"  (1.702 m).   Weight as of this encounter: 102 kg.     DVT prophylaxis: Lovenox Code Status: Full code Family Communication:  Disposition Plan:  Status is: Inpatient Remains inpatient appropriate because: SNF bed on Monday    Subjective: Seen  and examined at bedside.  No complaints  Examination: Constitutional: Not in acute distress Respiratory: Clear to auscultation bilaterally Cardiovascular: Normal sinus rhythm, no rubs Abdomen: Nontender nondistended good bowel sounds Musculoskeletal: No edema noted Skin: No rashes seen Neurologic: CN 2-12 grossly intact.  And nonfocal Psychiatric: Normal judgment and insight. Alert and oriented x 3. Normal  mood. Objective: Vitals:   08/26/22 2035 08/27/22 0409 08/27/22 0803 08/27/22 0808  BP: 137/74 (!) 149/74 (!) 149/68 (!) 149/68  Pulse: 72 78 80 80  Resp: 18  18   Temp: 98.4 F (36.9 C) 98.1 F (36.7 C) 97.9 F (36.6 C)   TempSrc: Oral Oral Oral   SpO2: 100% 96% 98%   Weight:  106.3 kg    Height:        Intake/Output Summary (Last 24 hours) at 08/27/2022 0814 Last data filed at 08/27/2022 0600 Gross per 24 hour  Intake 1206.6 ml  Output 300 ml  Net 906.6 ml   Filed Weights   08/25/22 0500 08/26/22 0500 08/27/22 0409  Weight: 104.3 kg 103.8 kg 106.3 kg     Data Reviewed:   CBC: Recent Labs  Lab 08/20/22 2015 08/21/22 0018 08/22/22 0742 08/23/22 0442 08/24/22 0911 08/25/22 0451 08/26/22 0543 08/27/22 0459  WBC 14.6* 13.8*   < > 15.4* 13.4* 12.5* 11.5* 10.3  NEUTROABS 10.2* 9.4*  --   --   --   --   --   --   HGB 12.0 12.2   < > 10.4* 10.9* 11.2* 11.1* 10.7*  HCT 38.8 38.9   < > 33.0* 34.8* 35.5* 35.2* 33.9*  MCV 86.6 86.1   < > 84.8 85.5 86.0 85.6 86.5  PLT 286 302   < > 294 329 358 342 327   < > = values in this interval not displayed.   Basic Metabolic Panel: Recent Labs  Lab 08/22/22 0742 08/23/22 0442 08/24/22 0911 08/25/22 0451 08/26/22 0543 08/27/22 0459  NA 133* 136 141 143 141 141  K 3.2* 3.5 3.2* 3.6 3.8 3.8  CL 97* 99 104 105 106 108  CO2 26 25 27 25 24 26   GLUCOSE 337* 271* 159* 78 94 101*  BUN 30* 37* 26* 23 22 20   CREATININE 1.00 0.94 0.78 0.70 0.65 0.69  CALCIUM 8.4* 8.8* 8.7* 9.0 8.4* 8.3*  MG 2.1 2.3  --  2.3 2.1 2.3   GFR: Estimated Creatinine Clearance: 85.6 mL/min (by C-G formula based on SCr of 0.69 mg/dL). Liver Function Tests: Recent Labs  Lab 08/20/22 2015 08/21/22 0018  AST 24 14*  ALT 13 9  ALKPHOS 67 71  BILITOT 1.0 0.5  PROT 7.7 7.9  ALBUMIN 3.2* 3.2*   No results for input(s): "LIPASE", "AMYLASE" in the last 168 hours. Recent Labs  Lab 08/22/22 0047  AMMONIA 26   Coagulation Profile: No results for  input(s): "INR", "PROTIME" in the last 168 hours. Cardiac Enzymes: No results for input(s): "CKTOTAL", "CKMB", "CKMBINDEX", "TROPONINI" in the last 168 hours. BNP (last 3 results) No results for input(s): "PROBNP" in the last 8760 hours. HbA1C: No results for input(s): "HGBA1C" in the last 72 hours. CBG: Recent Labs  Lab 08/26/22 0740 08/26/22 1130 08/26/22 1647 08/26/22 2036 08/27/22 0753  GLUCAP 83 98 120* 168* 102*   Lipid Profile: No results for input(s): "CHOL", "HDL", "LDLCALC", "TRIG", "CHOLHDL", "LDLDIRECT" in the last 72 hours. Thyroid Function Tests: No results for input(s): "TSH", "T4TOTAL", "FREET4", "T3FREE", "THYROIDAB" in the last 72 hours.  Anemia Panel: No  results for input(s): "VITAMINB12", "FOLATE", "FERRITIN", "TIBC", "IRON", "RETICCTPCT" in the last 72 hours.  Sepsis Labs: Recent Labs  Lab 08/20/22 2015  LATICACIDVEN 1.4    Recent Results (from the past 240 hour(s))  Culture, blood (single)     Status: None   Collection Time: 08/20/22  8:15 PM   Specimen: BLOOD  Result Value Ref Range Status   Specimen Description   Final    BLOOD LEFT ANTECUBITAL Performed at [facility] [address], [place], [place] [address]    Special Requests   Final    BOTTLES DRAWN AEROBIC AND ANAEROBIC Blood Culture results may not be optimal due to an inadequate volume of blood received in culture bottles Performed at [facility] [address], [place], [place] [address]    Culture   Final    NO GROWTH 5 DAYS Performed at [facility], [place] [address] [place], [place] [address]    Report Status 08/26/2022 FINAL  Final  Culture, blood (single)     Status: None   Collection Time: 08/20/22 10:16 PM   Specimen: BLOOD  Result Value Ref Range Status   Specimen Description   Final    BLOOD Performed at [facility] [address] [place], [place] [address]    Special Requests   Final    BOTTLES  DRAWN AEROBIC AND ANAEROBIC Blood Culture results may not be optimal due to an inadequate volume of blood received in culture bottles Performed at [facility] [address], [place], [place] [address]    Culture   Final    NO GROWTH 5 DAYS Performed at [facility], [place] [address] [place], [place] [address]    Report Status 08/26/2022 FINAL  Final  Urine Culture (for pregnant, neutropenic or urologic patients or patients with an indwelling urinary catheter)     Status: None   Collection Time: 08/20/22 11:16 PM   Specimen: Urine, Clean Catch  Result Value Ref Range Status   Specimen Description   Final    URINE, CLEAN CATCH Performed at [facility], [place] [address] [place], [place] [address]    Special Requests   Final    NONE Performed at [facility], [place] [address], [place], [place] [address]    Culture   Final    NO GROWTH Performed at [facility], [place] [address], [place], [place] [address]    Report Status 08/22/2022 FINAL  Final  Urine Culture (for pregnant, neutropenic or urologic patients or patients with an indwelling urinary catheter)     Status: None   Collection Time: 08/22/22  4:59 AM   Specimen: Urine, Clean Catch  Result Value Ref Range Status   Specimen Description   Final    URINE, CLEAN CATCH Performed at [facility], [place] [address], [place], [place] [address]    Special Requests   Final    NONE Performed at [facility], [place] [address], [place], [place] [address]    Culture   Final    NO GROWTH Performed at [facility], [place] [address], [place], [place] [address]    Report Status 08/23/2022 FINAL  Final         Radiology Studies: No results found.      Scheduled Meds:  allopurinol  100 mg Oral Daily   aspirin EC  81 mg Oral BH-q7a   atorvastatin  20 mg Oral QPM   carvedilol  25 mg Oral BID WC   colchicine  0.6 mg Oral  Daily   cyanocobalamin  1,000 mcg Intramuscular Daily   dapagliflozin propanediol  10 mg Oral QAC breakfast   enoxaparin (LOVENOX) injection  40 mg Subcutaneous Daily   ezetimibe  10 mg Oral QHS   furosemide  20 mg Oral Daily   hydrALAZINE  50 mg Oral Q8H   insulin aspart  0-15 Units Subcutaneous TID WC   insulin aspart  6 Units Subcutaneous QAC supper   insulin aspart  8 Units Subcutaneous BID WC   insulin glargine-yfgn  42 Units Subcutaneous Daily   losartan  100 mg Oral Daily   potassium chloride  20 mEq Oral Daily   Continuous Infusions:   ceFAZolin (ANCEF) IV 2 g (08/27/22 0545)     LOS: 7 days   Time spent= 35 mins    Phylicia Mcgaugh Arsenio Loader, MD [facility]  If 7PM-7AM, please contact night-coverage  08/27/2022, 8:14 AM

## 2022-08-27 NOTE — TOC Progression Note (Signed)
Transition of Care [facility]) - Progression Note    Patient Details  Name: Whitney Sloan MRN: [id number] Date of Birth: 25-Jul-1955  Transition of Care [facility]) CM/SW Contact  Clemens Lachman, Juliann Pulse, RN Phone Number: 08/27/2022, 11:10 AM  Clinical Narrative: Insurance auth initiated for [employer]. MD aware.      Expected Discharge Plan: Skilled Nursing Facility Barriers to Discharge: Insurance Authorization  Expected Discharge Plan and Services                                               Social Determinants of Health (SDOH) Interventions SDOH Screenings   Food Insecurity: No Food Insecurity (08/21/2022)  Housing: Low Risk  (08/21/2022)  Transportation Needs: No Transportation Needs (08/21/2022)  Utilities: Not At Risk (08/21/2022)  Depression (PHQ2-9): Low Risk  (03/24/2021)  Tobacco Use: Low Risk  (08/20/2022)    Readmission Risk Interventions    09/02/2021    3:21 PM  Readmission Risk Prevention Plan  Post Dischage Appt Complete  Medication Screening Complete  Transportation Screening Complete

## 2022-08-28 DIAGNOSIS — L03115 Cellulitis of right lower limb: Secondary | ICD-10-CM | POA: Diagnosis not present

## 2022-08-28 LAB — BASIC METABOLIC PANEL
Anion gap: 9 (ref 5–15)
BUN: 16 mg/dL (ref 8–23)
CO2: 24 mmol/L (ref 22–32)
Calcium: 8.3 mg/dL — ABNORMAL LOW (ref 8.9–10.3)
Chloride: 107 mmol/L (ref 98–111)
Creatinine, Ser: 0.81 mg/dL (ref 0.44–1.00)
GFR, Estimated: >60 mL/min (ref >60)
Glucose, Bld: 116 mg/dL — ABNORMAL HIGH (ref 70–99)
Potassium: 4.3 mmol/L (ref 3.5–5.1)
Sodium: 140 mmol/L (ref 135–145)

## 2022-08-28 LAB — CBC
HCT: 34.9 % — ABNORMAL LOW (ref 36.0–46.0)
Hemoglobin: 10.3 g/dL — ABNORMAL LOW (ref 12.0–15.0)
MCH: 26.8 pg (ref 26.0–34.0)
MCHC: 29.5 g/dL — ABNORMAL LOW (ref 30.0–36.0)
MCV: 90.9 fL (ref 80.0–100.0)
Platelets: 230 10*3/uL (ref 150–400)
RBC: 3.84 MIL/uL — ABNORMAL LOW (ref 3.87–5.11)
RDW: 14 % (ref 11.5–15.5)
WBC: 11.3 10*3/uL — ABNORMAL HIGH (ref 4.0–10.5)
nRBC: 0 % (ref 0.0–0.2)

## 2022-08-28 LAB — GLUCOSE, CAPILLARY
Glucose-Capillary: 160 mg/dL — ABNORMAL HIGH (ref 70–99)
Glucose-Capillary: 130 mg/dL — ABNORMAL HIGH (ref 70–99)
Glucose-Capillary: 147 mg/dL — ABNORMAL HIGH (ref 70–99)
Glucose-Capillary: 136 mg/dL — ABNORMAL HIGH (ref 70–99)

## 2022-08-28 LAB — MAGNESIUM: Magnesium: 2.3 mg/dL (ref 1.7–2.4)

## 2022-08-28 MED ORDER — ENSURE MAX PROTEIN PO LIQD
11.0000 [oz_av] | Freq: Every day | ORAL | Status: DC
  Administered 2022-08-28: 11 [oz_av] via ORAL
  Filled 2022-08-28 (×2): qty 330

## 2022-08-28 MED ORDER — HYDRALAZINE HCL 50 MG PO TABS
75.0000 mg | ORAL_TABLET | Freq: Three times a day (TID) | ORAL | Status: DC
  Administered 2022-08-28 – 2022-08-29 (×4): 75 mg via ORAL
  Filled 2022-08-28 (×4): qty 1

## 2022-08-28 NOTE — Progress Notes (Signed)
Mobility Specialist - Progress Note   08/28/22 1059  Mobility  Activity Ambulated with assistance in hallway  Level of Assistance Standby assist, set-up cues, supervision of patient - no hands on  Assistive Device Front wheel walker  Distance Ambulated (ft) 80 ft  Activity Response Tolerated well  Mobility Referral Yes  $Mobility charge 1 Mobility   Pt received in recliner and agreeable to mobility. Pt was MinA from sit>stand. No complaints during session. Pt was really excited that she was able to walk further today. Pt to recliner after session.  [facility]

## 2022-08-28 NOTE — Progress Notes (Signed)
Nutrition Brief Note  Patient identified on the Malnutrition Screening Tool (MST) Report; score of 2.0  Wt Readings from Last 15 Encounters:  08/28/22 106 kg  08/17/22 109.8 kg  03/02/22 111.6 kg  12/12/21 109.8 kg  09/02/21 106.6 kg  08/27/21 111.1 kg  03/24/21 106.1 kg  03/10/21 110 kg  12/18/20 111.2 kg  10/14/20 113 kg  04/10/20 104.3 kg  03/26/20 111.7 kg  03/18/20 112 kg  01/23/20 106.6 kg  01/02/20 108 kg    Body mass index is 36.59 kg/m. Patient meets criteria for obesity based on current BMI. Weight today is 234 lb and weight on 3/6 was 241 lb. This indicates 7 lb / 3% wt loss in the past 11 days. Patient receiving furosemide with hx of CHF and noted cellulitis with associated swelling to RLE on admission (08/20/22).  Current diet order is Carb Modified, patient is consuming 100% of all meals since dinner on 3/15. Labs and medications reviewed.   Ordered Ensure Max once/day (150 kcal and 30 grams protein/bottle). No other nutrition interventions warranted at this time. If nutrition issues arise, please consult RD.     Jarome Matin, MS, RD, LDN, CNSC Clinical Dietitian PRN/Relief staff On-call/weekend pager # available in [facility]

## 2022-08-28 NOTE — Progress Notes (Signed)
PROGRESS NOTE    Whitney Sloan  [id number] DOB: [birth year] DOA: 08/20/2022 PCP: Nolene Ebbs, MD   Brief Narrative:  67 year old with past medical history significant for chronic diastolic heart failure, diabetes type 2, hypertension, hyperlipidemia presented with sepsis due to right lower extremity cellulitis.   Of note patient recently presented to [facility] on 08/17/2022 after a ground-level mechanical fall after which she sustained abrasion of the anterior aspect of the right lower extremity.  X-ray at that time was negative for fracture.  She was subsequently discharged home.  She returned the day of admission complaining of developing redness, increased warmth and swelling at the abrasion site.   Patient admitted for right lower extremity cellulitis. Fall, Gout/Pseudogout of BL knees, worsening left LE weakness which has improved. Plan to discharge to SNF when bed available.    Requested TOC to initiate authorization.  Assessment & Plan:  Principal Problem:   Cellulitis of right lower extremity Active Problems:   Chronic diastolic CHF (congestive heart failure) ([place])   Essential hypertension   Hyperlipidemia   DM2 (diabetes mellitus, type 2) ([place])   Sepsis ([place])   Fall at home, initial encounter     Sepsis secondary to right lower extremity cellulitis: Right LE cellulitis  Sepsis physiology improving, initially on IV vancomycin and Rocephin.  Completed her 7-day course of antibiotics in the hospital.   Ground-level Mechanical Fall: PT recommend SNF   Bilateral Knee pain, worse right; Gout, Pseudo Gout. Improved.  Suspect gout, pseudogout.  Ortho consulted. Recommend Tx for gout /Pseudogout.  Finished 3 days of prednisone.  Continue with colchicine and allopurinol.  X ray left knee negative. X ray right knee negative for fracture.  Pain improved, swelling improved.    Urinary incontinence for over a year.  Discussed with Daughter, patient will need to be  evaluated out patient by Urology.  Tx for UTI, UA with 21-50 WBC.  She has been able to urinate  Urine culture; No growth to date.    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 MRI 3/10 neg for stroke here. It has improved since.    Fecal incontinence.  Could be related to inability to ambulate independent.  Needs follow up with GI out patient.  She has been able to tell staff when she needs to have Bowel movement.    Chronic Diastolic HF Resume Lasix. Coreg, losartan   Diabetes Type 2: Hyperglycemia, uncontrolled.  Blood sugars are very labile.  Blood glucose has improved.  Today we will continue Semglee 42 units daily, NovoLog 8 units before breakfast and lunch and 6 units before dinner.     HTN; Continue with Carvedilol and Losartan   Hyperlipidemia:  Continue with Zetia and lipitor.    Acute metabolic encephalopathy; Resolved.  MRI brain negative - B 12 low, TSH normal , Ammonia normal.  ABG Oxygen low at 70   Acute hypoxia; Resolved on RA now.,    B 12 Deficiency; 153. Started B 12 supplement.    Hypokalemia; Replaced.  Started oral supplement.      Estimated body mass index is 35.21 kg/m as calculated from the following:   Height as of this encounter: 5\' 7"  (1.702 m).   Weight as of this encounter: 102 kg.     DVT prophylaxis: Lovenox Code Status: Full code Family Communication:  Disposition Plan:  Status is: Inpatient Remains inpatient appropriate because: SNF bed on Monday    Subjective: Doing well, ambulating  in the room with the help of walker.  Overall in good spirits  Examination: Constitutional: Not in acute distress Respiratory: Clear to auscultation bilaterally Cardiovascular: Normal sinus rhythm, no rubs Abdomen: Nontender nondistended good bowel sounds Musculoskeletal: No edema noted Skin: No rashes seen Neurologic: CN 2-12 grossly intact.  And nonfocal Psychiatric: Normal judgment and insight.  Alert and oriented x 3. Normal mood. Objective: Vitals:   08/27/22 1331 08/27/22 2148 08/28/22 0426 08/28/22 0427  BP: 134/64 (!) 155/61  (!) 160/72  Pulse: 73 81  75  Resp: 18 17  (!) 22  Temp: 97.8 F (36.6 C) 98 F (36.7 C)  98.4 F (36.9 C)  TempSrc: Oral Oral  Oral  SpO2: 99% 97%  97%  Weight:   106 kg   Height:        Intake/Output Summary (Last 24 hours) at 08/28/2022 1217 Last data filed at 08/28/2022 0959 Gross per 24 hour  Intake 1420 ml  Output 1500 ml  Net -80 ml   Filed Weights   08/26/22 0500 08/27/22 0409 08/28/22 0426  Weight: 103.8 kg 106.3 kg 106 kg     Data Reviewed:   CBC: Recent Labs  Lab 08/24/22 0911 08/25/22 0451 08/26/22 0543 08/27/22 0459 08/28/22 0527  WBC 13.4* 12.5* 11.5* 10.3 11.3*  HGB 10.9* 11.2* 11.1* 10.7* 10.3*  HCT 34.8* 35.5* 35.2* 33.9* 34.9*  MCV 85.5 86.0 85.6 86.5 90.9  PLT 329 358 342 327 [id number]   Basic Metabolic Panel: Recent Labs  Lab 08/23/22 0442 08/24/22 0911 08/25/22 0451 08/26/22 0543 08/27/22 0459 08/28/22 0527  NA 136 141 143 141 141 140  K 3.5 3.2* 3.6 3.8 3.8 4.3  CL 99 104 105 106 108 107  CO2 25 27 25 24 26 24   GLUCOSE 271* 159* 78 94 101* 116*  BUN 37* 26* 23 22 20 16   CREATININE 0.94 0.78 0.70 0.65 0.69 0.81  CALCIUM 8.8* 8.7* 9.0 8.4* 8.3* 8.3*  MG 2.3  --  2.3 2.1 2.3 2.3   GFR: Estimated Creatinine Clearance: 84.5 mL/min (by C-G formula based on SCr of 0.81 mg/dL). Liver Function Tests: No results for input(s): "AST", "ALT", "ALKPHOS", "BILITOT", "PROT", "ALBUMIN" in the last 168 hours.  No results for input(s): "LIPASE", "AMYLASE" in the last 168 hours. Recent Labs  Lab 08/22/22 0047  AMMONIA 26   Coagulation Profile: No results for input(s): "INR", "PROTIME" in the last 168 hours. Cardiac Enzymes: No results for input(s): "CKTOTAL", "CKMB", "CKMBINDEX", "TROPONINI" in the last 168 hours. BNP (last 3 results) No results for input(s): "PROBNP" in the last 8760 hours. HbA1C: No  results for input(s): "HGBA1C" in the last 72 hours. CBG: Recent Labs  Lab 08/27/22 1127 08/27/22 1640 08/27/22 2021 08/28/22 0807 08/28/22 1111  GLUCAP 129* 138* 148* 160* 130*   Lipid Profile: No results for input(s): "CHOL", "HDL", "LDLCALC", "TRIG", "CHOLHDL", "LDLDIRECT" in the last 72 hours. Thyroid Function Tests: No results for input(s): "TSH", "T4TOTAL", "FREET4", "T3FREE", "THYROIDAB" in the last 72 hours.  Anemia Panel: No results for input(s): "VITAMINB12", "FOLATE", "FERRITIN", "TIBC", "IRON", "RETICCTPCT" in the last 72 hours.  Sepsis Labs: No results for input(s): "PROCALCITON", "LATICACIDVEN" in the last 168 hours.   Recent Results (from the past 240 hour(s))  Culture, blood (single)     Status: None   Collection Time: 08/20/22  8:15 PM   Specimen: BLOOD  Result Value Ref Range Status   Specimen Description   Final    BLOOD LEFT ANTECUBITAL Performed at [facility]  [place] [address], [place], [place] [address]    Special Requests   Final    BOTTLES DRAWN AEROBIC AND ANAEROBIC Blood Culture results may not be optimal due to an inadequate volume of blood received in culture bottles Performed at [place] [address] [place], [place] [address]    Culture   Final    NO GROWTH 5 DAYS Performed at [facility], [place] [address], [place], [place] [address]    Report Status 08/26/2022 FINAL  Final  Culture, blood (single)     Status: None   Collection Time: 08/20/22 10:16 PM   Specimen: BLOOD  Result Value Ref Range Status   Specimen Description   Final    BLOOD Performed at [facility] [address], [place], [place] [address]    Special Requests   Final    BOTTLES DRAWN AEROBIC AND ANAEROBIC Blood Culture results may not be optimal due to an inadequate volume of blood received in culture bottles Performed at [place] [address], [place], [place] [address]     Culture   Final    NO GROWTH 5 DAYS Performed at [facility], [place] [address], [place], [place] [address]    Report Status 08/26/2022 FINAL  Final  Urine Culture (for pregnant, neutropenic or urologic patients or patients with an indwelling urinary catheter)     Status: None   Collection Time: 08/20/22 11:16 PM   Specimen: Urine, Clean Catch  Result Value Ref Range Status   Specimen Description   Final    URINE, CLEAN CATCH Performed at [facility], [place] [address] [place], [place] [address]    Special Requests   Final    NONE Performed at [facility], [place] [address], [place], [place] [address]    Culture   Final    NO GROWTH Performed at [facility], [place] [address], [place], [place] [address]    Report Status 08/22/2022 FINAL  Final  Urine Culture (for pregnant, neutropenic or urologic patients or patients with an indwelling urinary catheter)     Status: None   Collection Time: 08/22/22  4:59 AM   Specimen: Urine, Clean Catch  Result Value Ref Range Status   Specimen Description   Final    URINE, CLEAN CATCH Performed at [facility], [place] [address] [place], [place] [address]    Special Requests   Final    NONE Performed at [facility], [place] [address], [place], [place] [address]    Culture   Final    NO GROWTH Performed at [facility], [place] [address], [place],  [address]    Report Status 08/23/2022 FINAL  Final         Radiology Studies: No results found.      Scheduled Meds:  allopurinol  100 mg Oral Daily   aspirin EC  81 mg Oral BH-q7a   atorvastatin  20 mg Oral QPM   carvedilol  25 mg Oral BID WC   colchicine  0.6 mg Oral Daily   dapagliflozin propanediol  10 mg Oral QAC breakfast   enoxaparin (LOVENOX) injection  40 mg Subcutaneous Daily   ezetimibe  10 mg Oral QHS   furosemide  20 mg Oral Daily   hydrALAZINE  75 mg Oral Q8H   insulin  aspart  0-15 Units Subcutaneous TID WC   insulin  aspart  6 Units Subcutaneous QAC supper   insulin aspart  8 Units Subcutaneous BID WC   insulin glargine-yfgn  42 Units Subcutaneous Daily   losartan  100 mg Oral Daily   Continuous Infusions:     LOS: 8 days   Time spent= 35 mins    Kamren Heskett Arsenio Loader, MD [facility]  If 7PM-7AM, please contact night-coverage  08/28/2022, 12:17 PM

## 2022-08-28 NOTE — Plan of Care (Signed)
  Problem: Fluid Volume: Goal: Ability to maintain a balanced intake and output will improve Outcome: Progressing   Problem: Tissue Perfusion: Goal: Adequacy of tissue perfusion will improve Outcome: Progressing   Problem: Activity: Goal: Risk for activity intolerance will decrease Outcome: Progressing

## 2022-08-28 NOTE — TOC Progression Note (Signed)
Transition of Care [facility]) - Progression Note    Patient Details  Name: Whitney Sloan MRN: [id number] Date of Birth: [birth year]  Transition of Care [facility]) CM/SW Contact  Lennart Pall, LCSW Phone Number: 08/28/2022, 10:07 AM  Clinical Narrative:    Have received insurance authorization [phone] Josem Kaufmann 3/16-3/18).  Reached out to [facility] to check if could possibly admit today but no bed available until tomorrow.   Expected Discharge Plan: Skilled Nursing Facility Barriers to Discharge: Insurance Authorization  Expected Discharge Plan and Services                                               Social Determinants of Health (SDOH) Interventions SDOH Screenings   Food Insecurity: No Food Insecurity (08/21/2022)  Housing: Low Risk  (08/21/2022)  Transportation Needs: No Transportation Needs (08/21/2022)  Utilities: Not At Risk (08/21/2022)  Depression (PHQ2-9): Low Risk  (03/24/2021)  Tobacco Use: Low Risk  (08/20/2022)    Readmission Risk Interventions    09/02/2021    3:21 PM  Readmission Risk Prevention Plan  Post Dischage Appt Complete  Medication Screening Complete  Transportation Screening Complete

## 2022-08-28 NOTE — Plan of Care (Signed)
  Problem: Nutritional: Goal: Maintenance of adequate nutrition will improve Outcome: Not Progressing Goal: Progress toward achieving an optimal weight will improve Outcome: Not Progressing

## 2022-08-29 DIAGNOSIS — L03115 Cellulitis of right lower limb: Secondary | ICD-10-CM | POA: Diagnosis not present

## 2022-08-29 LAB — BASIC METABOLIC PANEL
Anion gap: 9 (ref 5–15)
BUN: 14 mg/dL (ref 8–23)
CO2: 26 mmol/L (ref 22–32)
Calcium: 8.7 mg/dL — ABNORMAL LOW (ref 8.9–10.3)
Chloride: 105 mmol/L (ref 98–111)
Creatinine, Ser: 0.78 mg/dL (ref 0.44–1.00)
GFR, Estimated: >60 mL/min (ref >60)
Glucose, Bld: 107 mg/dL — ABNORMAL HIGH (ref 70–99)
Potassium: 3.8 mmol/L (ref 3.5–5.1)
Sodium: 140 mmol/L (ref 135–145)

## 2022-08-29 LAB — CBC
HCT: 36.1 % (ref 36.0–46.0)
Hemoglobin: 11.1 g/dL — ABNORMAL LOW (ref 12.0–15.0)
MCH: 26.9 pg (ref 26.0–34.0)
MCHC: 30.7 g/dL (ref 30.0–36.0)
MCV: 87.4 fL (ref 80.0–100.0)
Platelets: 339 10*3/uL (ref 150–400)
RBC: 4.13 MIL/uL (ref 3.87–5.11)
RDW: 14.1 % (ref 11.5–15.5)
WBC: 10.8 10*3/uL — ABNORMAL HIGH (ref 4.0–10.5)
nRBC: 0 % (ref 0.0–0.2)

## 2022-08-29 LAB — GLUCOSE, CAPILLARY
Glucose-Capillary: 202 mg/dL — ABNORMAL HIGH (ref 70–99)
Glucose-Capillary: 117 mg/dL — ABNORMAL HIGH (ref 70–99)

## 2022-08-29 LAB — MAGNESIUM: Magnesium: 2.4 mg/dL (ref 1.7–2.4)

## 2022-08-29 MED ORDER — HYDRALAZINE HCL 25 MG PO TABS: 75.0000 mg | ORAL_TABLET | Freq: Three times a day (TID) | ORAL | Status: AC

## 2022-08-29 MED ORDER — INSULIN ASPART 100 UNIT/ML IJ SOLN: 6.0000 [IU] | Freq: Every day | INTRAMUSCULAR | 11 refills | Status: DC

## 2022-08-29 MED ORDER — VITAMIN B-12 1000 MCG PO TABS: 1000.0000 ug | ORAL_TABLET | Freq: Every day | ORAL | Status: AC

## 2022-08-29 MED ORDER — FUROSEMIDE 20 MG PO TABS: 20.0000 mg | ORAL_TABLET | Freq: Every day | ORAL | Status: AC

## 2022-08-29 MED ORDER — INSULIN GLARGINE-YFGN 100 UNIT/ML ~~LOC~~ SOLN: 42.0000 [IU] | Freq: Every day | SUBCUTANEOUS | 11 refills | Status: DC

## 2022-08-29 MED ORDER — ALLOPURINOL 100 MG PO TABS: 100.0000 mg | ORAL_TABLET | Freq: Every day | ORAL | Status: AC

## 2022-08-29 MED ORDER — INSULIN ASPART 100 UNIT/ML IJ SOLN: 0.0000 [IU] | Freq: Three times a day (TID) | INTRAMUSCULAR | 11 refills | Status: DC

## 2022-08-29 MED ORDER — COLCHICINE 0.6 MG PO TABS: 0.6000 mg | ORAL_TABLET | Freq: Every day | ORAL | 0 refills | Status: AC

## 2022-08-29 MED ORDER — INSULIN ASPART 100 UNIT/ML IJ SOLN: 8.0000 [IU] | Freq: Two times a day (BID) | INTRAMUSCULAR | 11 refills | Status: DC

## 2022-08-29 MED ORDER — ENSURE MAX PROTEIN PO LIQD: 11.0000 [oz_av] | Freq: Two times a day (BID) | ORAL | Status: AC

## 2022-08-29 NOTE — Discharge Summary (Signed)
Physician Discharge Summary  Whitney Sloan [id number] DOB: [birth year] DOA: 08/20/2022  PCP: Nolene Ebbs, MD  Admit date: 08/20/2022 Discharge date: 08/29/2022  Admitted From: Home Disposition:  SNF  Recommendations for Outpatient Follow-up:  Follow up with PCP in 1-2 weeks Please obtain BMP/CBC in one week your next doctors visit.  Oral daily colchicine for 5 days.  Allopurinol 100 mg daily Hydralazine 75 mg 3 times daily. Insulin regimen has been adjusted.  Current regimen will be long-acting Semglee 42 units daily, NovoLog 8 units Premeal before breakfast and lunch, 6 units before supper.  Sliding scale as prescribed.  This can further be adjusted as necessary. For now we will discontinue a Inspra, metformin, torsemide, potassium supplements.  This can be adjusted in the future as necessary Currently on Lasix   Discharge Condition: Stable CODE STATUS: Full code Diet recommendation: Diabetic  Brief/Interim Summary: 67 year old with past medical history significant for chronic diastolic heart failure, diabetes type 2, hypertension, hyperlipidemia presented with sepsis due to right lower extremity cellulitis.   Of note patient recently presented to [facility] on 08/17/2022 after a ground-level mechanical fall after which she sustained abrasion of the anterior aspect of the right lower extremity.  X-ray at that time was negative for fracture.  She was subsequently discharged home.  She returned the day of admission complaining of developing redness, increased warmth and swelling at the abrasion site.   Patient admitted for right lower extremity cellulitis. Fall, Gout/Pseudogout of BL knees, worsening left LE weakness which has improved. Plan to discharge to SNF when bed available.    Requested TOC to initiate authorization.  Insulin has been adjusted as mentioned above. Medically stable for discharge.   Assessment & Plan:  Principal Problem:   Cellulitis of right lower  extremity Active Problems:   Chronic diastolic CHF (congestive heart failure) ([place])   Essential hypertension   Hyperlipidemia   DM2 (diabetes mellitus, type 2) ([place])   Sepsis ([place])   Fall at home, initial encounter       Sepsis secondary to right lower extremity cellulitis: Right LE cellulitis  Sepsis physiology improving, initially on IV vancomycin and Rocephin.  Completed her 7-day course of antibiotics in the hospital.   Ground-level Mechanical Fall: PT recommend SNF   Bilateral Knee pain, worse right; Gout, Pseudo Gout. Improved.  Suspect gout, pseudogout.  Ortho consulted. Recommend Tx for gout /Pseudogout.  Finished 3 days of prednisone.  Continue with colchicine for 5 more days and allopurinol daily.  X ray left knee negative. X ray right knee negative for fracture.  Pain improved, swelling improved.    Urinary incontinence for over a year.  Discussed with Daughter, patient will need to be evaluated out patient by Urology.  Tx for UTI, UA with 21-50 WBC.  She has been able to urinate  Urine culture; No growth to date.    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 MRI 3/10 neg for stroke here. It has improved since.    Fecal incontinence.  Could be related to inability to ambulate independent.  Needs follow up with GI out patient.  She has been able to tell staff when she needs to have Bowel movement.    Chronic Diastolic HF Resume Lasix. Coreg, losartan   Diabetes Type 2: Hyperglycemia, uncontrolled but overall much better controlled Blood sugars are very labile.  Blood glucose has improved.  Today we will continue Semglee 42 units daily, NovoLog 8 units before breakfast  and lunch and 6 units before dinner.     HTN; medications as above   Hyperlipidemia:  Continue with Zetia and lipitor.    Acute metabolic encephalopathy; Resolved.  MRI brain negative - B 12 low, TSH normal , Ammonia normal.  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 Started B 12 supplement.    Hypokalemia; Replaced.  Started oral supplement.  On daily Lasix    Discharge Diagnoses:  Principal Problem:   Cellulitis of right lower extremity Active Problems:   Chronic diastolic CHF (congestive heart failure) ([place])   Essential hypertension   Hyperlipidemia   DM2 (diabetes mellitus, type 2) ([place])   Sepsis ([place])   Fall at home, initial encounter      Consultations: None  Subjective: Feels well no complaints.  Waiting to be discharged to a rehab facility.  Discharge Exam: Vitals:   08/29/22 0530 08/29/22 0531  BP: (!) 179/75 (!) 179/75  Pulse:  65  Resp:    Temp:  98.2 F (36.8 C)  SpO2:  100%   Vitals:   08/28/22 1419 08/28/22 1959 08/29/22 0530 08/29/22 0531  BP: (!) 147/75 (!) 181/59 (!) 179/75 (!) 179/75  Pulse: 62 68  65  Resp: (!) 21 20    Temp: 97.7 F (36.5 C) 98.3 F (36.8 C)  98.2 F (36.8 C)  TempSrc: Oral Oral  Oral  SpO2: 99% 99%  100%  Weight:    106.5 kg  Height:        General: Pt is alert, awake, not in acute distress Cardiovascular: RRR, S1/S2 +, no rubs, no gallops Respiratory: CTA bilaterally, no wheezing, no rhonchi Abdominal: Soft, NT, ND, bowel sounds + Extremities: no edema, no cyanosis  Discharge Instructions   Allergies as of 08/29/2022       Reactions   Oxycodone-acetaminophen Nausea And Vomiting        Medication List     STOP taking these medications    eplerenone 25 MG tablet Commonly known as: INSPRA   fluconazole 150 MG tablet Commonly known as: DIFLUCAN   magnesium oxide 400 MG tablet Commonly known as: MAG-OX   metFORMIN 500 MG tablet Commonly known as: GLUCOPHAGE   NovoLOG FlexPen 100 UNIT/ML FlexPen Generic drug: insulin aspart Replaced by: insulin aspart 100 UNIT/ML injection   potassium chloride SA 20 MEQ tablet Commonly known as: KLOR-CON M   torsemide 20 MG tablet Commonly known as:  DEMADEX   Tresiba FlexTouch 200 UNIT/ML FlexTouch Pen Generic drug: insulin degludec       TAKE these medications    acetaminophen 500 MG tablet Commonly known as: TYLENOL Take 500 mg by mouth every 6 (six) hours as needed for moderate pain or headache.   ALKA-SELTZER PLUS COLD PO Take 2 tablets by mouth every 6 (six) hours as needed (cold symptoms).   allopurinol 100 MG tablet Commonly known as: ZYLOPRIM Take 1 tablet (100 mg total) by mouth daily.   Aspir-Low 81 MG tablet Generic drug: aspirin EC Take 81 mg by mouth every morning.   atorvastatin 20 MG tablet Commonly known as: LIPITOR Take 1 tablet (20 mg total) by mouth every evening.   carvedilol 25 MG tablet Commonly known as: Coreg Take 1 tablet (25 mg total) by mouth 2 (two) times daily.   colchicine 0.6 MG tablet Take 1 tablet (0.6 mg total) by mouth daily for 5 days.   cyanocobalamin 1000 MCG tablet Commonly  known as: VITAMIN B12 Take 1 tablet (1,000 mcg total) by mouth daily.   dapagliflozin propanediol 10 MG Tabs tablet Commonly known as: Farxiga Take 1 tablet (10 mg total) by mouth daily before breakfast.   Dexcom G7 Sensor Misc 1 Device by Does not apply route as directed.   Diclofenac Sodium 3 % Gel Apply 2 g topically 2 (two) times daily as needed. What changed: reasons to take this   ELDERBERRY PO Take 1 tablet by mouth daily as needed (immune support). Gummy   Ensure Max Protein Liqd Take 330 mLs (11 oz total) by mouth 2 (two) times daily.   Ex-Lax 15 MG Tabs Generic drug: Sennosides Take 15 mg by mouth daily as needed (constipation).   ezetimibe 10 MG tablet Commonly known as: ZETIA Take 10 mg by mouth at bedtime.   furosemide 20 MG tablet Commonly known as: LASIX Take 1 tablet (20 mg total) by mouth daily. What changed:  medication strength how much to take   hydrALAZINE 25 MG tablet Commonly known as: APRESOLINE Take 3 tablets (75 mg total) by mouth every 8 (eight) hours.    insulin aspart 100 UNIT/ML injection Commonly known as: novoLOG Inject 0-15 Units into the skin 3 (three) times daily with meals. Replaces: NovoLOG FlexPen 100 UNIT/ML FlexPen   insulin aspart 100 UNIT/ML injection Commonly known as: novoLOG Inject 8 Units into the skin 2 (two) times daily with breakfast and lunch.   insulin aspart 100 UNIT/ML injection Commonly known as: novoLOG Inject 6 Units into the skin daily before supper.   insulin glargine-yfgn 100 UNIT/ML injection Commonly known as: SEMGLEE Inject 0.42 mLs (42 Units total) into the skin daily.   Insulin Pen Needle 32G X 4 MM Misc 1 Device by Does not apply route as directed.   B-D UF III MINI PEN NEEDLES 31G X 5 MM Misc Generic drug: Insulin Pen Needle SMARTSIG:injection 3 Times Daily   losartan 100 MG tablet Commonly known as: COZAAR Take 1 tablet (100 mg total) by mouth daily.   polyethylene glycol 17 g packet Commonly known as: MIRALAX / GLYCOLAX Take 17 g by mouth daily as needed for mild constipation.   tamsulosin 0.4 MG Caps capsule Commonly known as: FLOMAX Take 0.4 mg by mouth daily.   VITAMIN D (CHOLECALCIFEROL) PO Take 1 capsule by mouth daily.        Contact information for follow-up providers     Nolene Ebbs, MD Follow up in 1 week(s).   Specialty: Internal Medicine Contact information: [place] [address] [id number]         Nolene Ebbs, MD .   Specialty: Internal Medicine Contact information: [place] [address] [phone]              Contact information for after-discharge care     Destination     [facility] Preferred SNF .   Service: Skilled Nursing Contact information: [id number] [address] [phone]                    Allergies  Allergen Reactions   Oxycodone-Acetaminophen Nausea And Vomiting    You were cared for by a hospitalist during  your hospital stay. If you have any questions about your discharge medications or the care you received while you were in the hospital after you are discharged, you can call the unit and asked to speak with the hospitalist on call if the hospitalist  that took care of you is not available. Once you are discharged, your primary care physician will handle any further medical issues. Please note that no refills for any discharge medications will be authorized once you are discharged, as it is imperative that you return to your primary care physician (or establish a relationship with a primary care physician if you do not have one) for your aftercare needs so that they can reassess your need for medications and monitor your lab values.  You were cared for by a hospitalist during your hospital stay. If you have any questions about your discharge medications or the care you received while you were in the hospital after you are discharged, you can call the unit and asked to speak with the hospitalist on call if the hospitalist that took care of you is not available. Once you are discharged, your primary care physician will handle any further medical issues. Please note that NO REFILLS for any discharge medications will be authorized once you are discharged, as it is imperative that you return to your primary care physician (or establish a relationship with a primary care physician if you do not have one) for your aftercare needs so that they can reassess your need for medications and monitor your lab values.  Please request your Prim.MD to go over all Hospital Tests and Procedure/Radiological results at the follow up, please get all Hospital records sent to your Prim MD by signing hospital release before you go home.  Get CBC, CMP, 2 view Chest X ray checked  by Primary MD during your next visit or SNF MD in 5-7 days ( we routinely change or add medications that can affect your baseline labs and fluid status,  therefore we recommend that you get the mentioned basic workup next visit with your PCP, your PCP may decide not to get them or add new tests based on their clinical decision)  On your next visit with your primary care physician please Get Medicines reviewed and adjusted.  If you experience worsening of your admission symptoms, develop shortness of breath, life threatening emergency, suicidal or homicidal thoughts you must seek medical attention immediately by calling 911 or calling your MD immediately  if symptoms less severe.  You Must read complete instructions/literature along with all the possible adverse reactions/side effects for all the Medicines you take and that have been prescribed to you. Take any new Medicines after you have completely understood and accpet all the possible adverse reactions/side effects.   Do not drive, operate heavy machinery, perform activities at heights, swimming or participation in water activities or provide baby sitting services if your were admitted for syncope or siezures until you have seen by Primary MD or a Neurologist and advised to do so again.  Do not drive when taking Pain medications.   Procedures/Studies: MR BRAIN WO CONTRAST  Result Date: 08/21/2022 CLINICAL DATA:  Left-sided weakness and confusion EXAM: MRI HEAD WITHOUT CONTRAST TECHNIQUE: Multiplanar, multiecho pulse sequences of the brain and surrounding structures were obtained without intravenous contrast. COMPARISON:  03/29/2009 FINDINGS: Brain: No acute infarct, mass effect or extra-axial collection. No chronic microhemorrhage or siderosis. There is multifocal hyperintense T2-weighted signal within the white matter. Parenchymal volume and CSF spaces are normal. Old left basal ganglia small vessel infarct. Punctate chronic infarct of the right cerebellum. The midline structures are normal. Vascular: Normal flow voids. Skull and upper cervical spine: Normal marrow signal. Sinuses/Orbits:  Negative. Other: None. IMPRESSION: 1. No acute intracranial abnormality. 2. Old  left basal ganglia small vessel infarct and findings of chronic small vessel ischemia. Electronically Signed   By: Ulyses Jarred M.D.   On: 08/21/2022 19:52   DG Chest 1 View  Result Date: 08/21/2022 CLINICAL DATA:  Hypoxia. EXAM: CHEST  1 VIEW COMPARISON:  12/12/2021. FINDINGS: 1731 hours. Low lung volumes accentuate the pulmonary vasculature and cardiomediastinal silhouette. No focal airspace opacity. No pleural effusion or pneumothorax. Visualized bones and upper abdomen are unremarkable. IMPRESSION: No evidence of acute cardiopulmonary disease. Electronically Signed   By: Emmit Alexanders M.D.   On: 08/21/2022 17:46   DG Knee 1-2 Views Left  Result Date: 08/21/2022 CLINICAL DATA:  LEFT knee pain. EXAM: LEFT KNEE - 2 VIEW COMPARISON:  12/12/2021 and prior radiographs FINDINGS: There is no evidence of acute fracture or dislocation. Medial and patellofemoral compartment joint space narrowing again noted. A small knee effusion is present. No focal bony lesions are identified. IMPRESSION: 1. No acute bony abnormality. 2. Small knee effusion. 3. Medial and patellofemoral compartment degenerative changes. Electronically Signed   By: Margarette Canada M.D.   On: 08/21/2022 11:49   DG Knee Complete 4 Views Right  Result Date: 08/17/2022 CLINICAL DATA:  Fall, knee pain EXAM: RIGHT KNEE - COMPLETE 4+ VIEW COMPARISON:  None Available. FINDINGS: Moderate to large joint effusion. No well-defined fracture, but there is irregularity noted in the region of the tibial spines. This could be related to spurring, but difficult to completely exclude acute injury. No subluxation or dislocation. Soft tissues are intact. IMPRESSION: No visible fracture. Irregularity noted in the region of the tibial spines which could reflect degenerative changes, but difficult to completely exclude acute injury with the moderate to large joint effusion. Consider further  evaluation with CT. Electronically Signed   By: Rolm Baptise M.D.   On: 08/17/2022 22:01   VAS [place] LOWER EXTREMITY VENOUS (DVT) (7a-7p)  Result Date: 08/17/2022  Lower Venous DVT Study Patient Name:  IRAN BLACKNER  Date of Exam:   08/17/2022 Medical Rec #: [id number]        Accession #:    [id number] Date of Birth: 03/25/1956         Patient Gender: F Patient Age:   44 years Exam Location:  [facility] Procedure:      VAS [place] LOWER EXTREMITY VENOUS (DVT) Referring Phys: Domenic Moras --------------------------------------------------------------------------------  Indications: Knee pain and swelling, Recent fall.  Comparison Study: Previous 12/10/19 negative. Performing Technologist: McKayla Maag RVT, [place]  Examination Guidelines: A complete evaluation includes B-mode imaging, spectral Doppler, color Doppler, and power Doppler as needed of all accessible portions of each vessel. Bilateral testing is considered an integral part of a complete examination. Limited examinations for reoccurring indications may be performed as noted. The reflux portion of the exam is performed with the patient in reverse Trendelenburg.  +---------+---------------+---------+-----------+----------+-------------------+ RIGHT    CompressibilityPhasicitySpontaneityPropertiesThrombus Aging      +---------+---------------+---------+-----------+----------+-------------------+ CFV      Full           Yes      Yes                                      +---------+---------------+---------+-----------+----------+-------------------+ SFJ      Full                                                             +---------+---------------+---------+-----------+----------+-------------------+  FV Prox  Full                                                             +---------+---------------+---------+-----------+----------+-------------------+ FV Mid   Full                                                              +---------+---------------+---------+-----------+----------+-------------------+ FV DistalFull                                                             +---------+---------------+---------+-----------+----------+-------------------+ PFV      Full                                                             +---------+---------------+---------+-----------+----------+-------------------+ POP      Full           Yes      Yes                                      +---------+---------------+---------+-----------+----------+-------------------+ PTV      Full                                                             +---------+---------------+---------+-----------+----------+-------------------+ PERO     Full                                         Limited                                                                   visualization due                                                         to vessel depth     +---------+---------------+---------+-----------+----------+-------------------+   +----+---------------+---------+-----------+----------+--------------+ LEFTCompressibilityPhasicitySpontaneityPropertiesThrombus Aging +----+---------------+---------+-----------+----------+--------------+ CFV Full           Yes      Yes                                 +----+---------------+---------+-----------+----------+--------------+  SFJ Full                                                        +----+---------------+---------+-----------+----------+--------------+     Summary: RIGHT: - There is no evidence of deep vein thrombosis in the lower extremity.  - No cystic structure found in the popliteal fossa.  LEFT: - No evidence of common femoral vein obstruction.  *See table(s) above for measurements and observations. Electronically signed by Harold Barban MD on 08/17/2022 at 9:23:18 PM.    Final    CT Head Wo Contrast  Result Date: 08/05/2022 CLINICAL  DATA:  Head trauma, minor (Age >= 65y), Unwitnessed fall EXAM: CT HEAD WITHOUT CONTRAST TECHNIQUE: Contiguous axial images were obtained from the base of the skull through the vertex without intravenous contrast. RADIATION DOSE REDUCTION: This exam was performed according to the departmental dose-optimization program which includes automated exposure control, adjustment of the mA and/or kV according to patient size and/or use of iterative reconstruction technique. COMPARISON:  08/30/2021 FINDINGS: Brain: Normal anatomic configuration. Parenchymal volume loss is commensurate with the patient's age. Mild periventricular white matter changes are present likely reflecting the sequela of small vessel ischemia. Remote infarct within the left frontal periventricular white matter again noted. No abnormal intra or extra-axial mass lesion or fluid collection. No abnormal mass effect or midline shift. No evidence of acute intracranial hemorrhage or infarct. Ventricular size is normal. Cerebellum unremarkable. Vascular: No asymmetric hyperdense vasculature at the skull base. Skull: Intact Sinuses/Orbits: Paranasal sinuses are clear. Orbits are unremarkable. Other: Mastoid air cells and middle ear cavities are clear. IMPRESSION: 1. No acute intracranial abnormality. No calvarial fracture. 2. Mild senescent change. Remote left frontal periventricular white matter infarct. Electronically Signed   By: Fidela Salisbury M.D.   On: 08/05/2022 21:56   DG Hip Unilat W or Wo Pelvis 2-3 Views Left  Result Date: 08/05/2022 CLINICAL DATA:  Pain after fall. EXAM: DG HIP (WITH OR WITHOUT PELVIS) 2-3V LEFT COMPARISON:  None Available. FINDINGS: Large body habitus limits evaluation of fine bony detail. Mild bilateral sacroiliac subchondral sclerosis. Moderate bilateral femoroacetabular and pubic symphysis joint space narrowing. No acute fracture or dislocation. Mild-to-moderate vascular calcifications. 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 No acute fracture is seen, within limitations of patient large body habitus and decreased bone detail. Electronically Signed   By: Yvonne Kendall M.D.   On: 08/05/2022 21:35     The results of significant diagnostics from this hospitalization (including imaging, microbiology, ancillary and laboratory) are listed below for reference.     Microbiology: Recent Results (from the past 240 hour(s))  Culture, blood (single)     Status: None   Collection Time: 08/20/22  8:15 PM   Specimen: BLOOD  Result Value Ref Range Status   Specimen Description   Final    BLOOD LEFT ANTECUBITAL Performed at [facility] [address] [place], [place] [address]    Special Requests   Final    BOTTLES DRAWN AEROBIC AND ANAEROBIC Blood Culture results may not be optimal due to an inadequate volume of blood received in culture bottles Performed at [facility] [address], [place], [place] [address]    Culture   Final    NO GROWTH 5 DAYS Performed at [facility], [address] [place], [place] [address]    Report Status 08/26/2022 FINAL  Final  Culture, blood (single)     Status: None   Collection Time: 08/20/22 10:16 PM   Specimen: BLOOD  Result Value Ref Range Status   Specimen Description   Final    BLOOD Performed at [facility] [address], [place], [place] [address]    Special Requests   Final    BOTTLES DRAWN AEROBIC AND ANAEROBIC Blood Culture results may not be optimal due to an inadequate volume of blood received in culture bottles Performed at [facility] [address] [place], [place] [address]    Culture   Final    NO GROWTH 5 DAYS Performed at [facility], [place] [address], [place], [place] [address]    Report Status 08/26/2022 FINAL  Final  Urine Culture (for pregnant, neutropenic or urologic patients or patients with an  indwelling urinary catheter)     Status: None   Collection Time: 08/20/22 11:16 PM   Specimen: Urine, Clean Catch  Result Value Ref Range Status   Specimen Description   Final    URINE, CLEAN CATCH Performed at [facility], [place] [address], [place], [place] [address]    Special Requests   Final    NONE Performed at [facility], [place] [address] [place], [place] [address]    Culture   Final    NO GROWTH Performed at [facility], [place] [address], [place], [place] [address]    Report Status 08/22/2022 FINAL  Final  Urine Culture (for pregnant, neutropenic or urologic patients or patients with an indwelling urinary catheter)     Status: None   Collection Time: 08/22/22  4:59 AM   Specimen: Urine, Clean Catch  Result Value Ref Range Status   Specimen Description   Final    URINE, CLEAN CATCH Performed at [facility], [place] [address], [place], [place] [address]    Special Requests   Final    NONE Performed at [facility], [place] [address] [place], [place] [address]    Culture   Final    NO GROWTH Performed at [facility], [place] [address] [address], [place], [place] [address]    Report Status 08/23/2022 FINAL  Final     Labs: BNP (last 3 results) Recent Labs    09/02/21 0128 12/12/21 1929  BNP 163.4* 123456*   Basic Metabolic Panel: Recent Labs  Lab 08/25/22 0451 08/26/22 0543 08/27/22 0459 08/28/22 0527 08/29/22 0510  NA 143 141 141 140 140  K 3.6 3.8 3.8 4.3 3.8  CL 105 106 108 107 105  CO2 25 24 26 24 26   GLUCOSE 78 94 101* 116* 107*  BUN 23 22 20 16 14   CREATININE 0.70 0.65 0.69 0.81 0.78  CALCIUM 9.0 8.4* 8.3* 8.3* 8.7*  MG 2.3 2.1 2.3 2.3 2.4   Liver Function Tests: No results for input(s): "AST", "ALT", "ALKPHOS", "BILITOT", "PROT", "ALBUMIN" in the last 168 hours. No results for input(s): "LIPASE", "AMYLASE" in the last 168 hours. No results for input(s): "AMMONIA" in the  last 168 hours. CBC: Recent Labs  Lab 08/25/22 0451 08/26/22 0543 08/27/22 0459 08/28/22 0527 08/29/22 0510  WBC 12.5* 11.5* 10.3 11.3* 10.8*  HGB 11.2* 11.1* 10.7* 10.3* 11.1*  HCT 35.5* 35.2* 33.9* 34.9* 36.1  MCV 86.0 85.6 86.5 90.9 87.4  PLT 358 342 327 230 339  Cardiac Enzymes: No results for input(s): "CKTOTAL", "CKMB", "CKMBINDEX", "TROPONINI" in the last 168 hours. BNP: Invalid input(s): "POCBNP" CBG: Recent Labs  Lab 08/28/22 1111 08/28/22 1646 08/28/22 2036 08/29/22 0915 08/29/22 1146  GLUCAP 130* 147* 136* 202* 117*   D-Dimer No results for input(s): "DDIMER" in the last 72 hours. Hgb A1c No results for input(s): "HGBA1C" in the last 72 hours. Lipid Profile No results for input(s): "CHOL", "HDL", "LDLCALC", "TRIG", "CHOLHDL", "LDLDIRECT" in the last 72 hours. Thyroid function studies No results for input(s): "TSH", "T4TOTAL", "T3FREE", "THYROIDAB" in the last 72 hours.  Invalid input(s): "FREET3" Anemia work up No results for input(s): "VITAMINB12", "FOLATE", "FERRITIN", "TIBC", "IRON", "RETICCTPCT" in the last 72 hours. Urinalysis    Component Value Date/Time   COLORURINE YELLOW 08/20/2022 2316   APPEARANCEUR HAZY (A) 08/20/2022 2316   LABSPEC 1.019 08/20/2022 2316   PHURINE 5.0 08/20/2022 2316   GLUCOSEU 50 (A) 08/20/2022 2316   HGBUR SMALL (A) 08/20/2022 2316   BILIRUBINUR NEGATIVE 08/20/2022 2316   KETONESUR NEGATIVE 08/20/2022 2316   PROTEINUR >=300 (A) 08/20/2022 2316   UROBILINOGEN 0.2 07/30/2022 1152   NITRITE NEGATIVE 08/20/2022 2316   LEUKOCYTESUR LARGE (A) 08/20/2022 2316   Sepsis Labs Recent Labs  Lab 08/26/22 0543 08/27/22 0459 08/28/22 0527 08/29/22 0510  WBC 11.5* 10.3 11.3* 10.8*   Microbiology Recent Results (from the past 240 hour(s))  Culture, blood (single)     Status: None   Collection Time: 08/20/22  8:15 PM   Specimen: BLOOD  Result Value Ref Range Status   Specimen Description   Final    BLOOD LEFT  ANTECUBITAL Performed at [facility], [place] [address] [place], [place] [address]    Special Requests   Final    BOTTLES DRAWN AEROBIC AND ANAEROBIC Blood Culture results may not be optimal due to an inadequate volume of blood received in culture bottles Performed at [place] [address], [place], [place] [address]    Culture   Final    NO GROWTH 5 DAYS Performed at [facility], [place] [address] [place], [place] [address]    Report Status 08/26/2022 FINAL  Final  Culture, blood (single)     Status: None   Collection Time: 08/20/22 10:16 PM   Specimen: BLOOD  Result Value Ref Range Status   Specimen Description   Final    BLOOD Performed at [place] [address], [place], [place] [address]    Special Requests   Final    BOTTLES DRAWN AEROBIC AND ANAEROBIC Blood Culture results may not be optimal due to an inadequate volume of blood received in culture bottles Performed at [place] [address] [place], [place] [address]    Culture   Final    NO GROWTH 5 DAYS Performed at [facility], [place] [address], [place], [place] [address]    Report Status 08/26/2022 FINAL  Final  Urine Culture (for pregnant, neutropenic or urologic patients or patients with an indwelling urinary catheter)     Status: None   Collection Time: 08/20/22 11:16 PM   Specimen: Urine, Clean Catch  Result Value Ref Range Status   Specimen Description   Final    URINE, CLEAN CATCH Performed at [facility], [place] [address], [place], [place] [address]    Special Requests   Final    NONE Performed at [facility], [place] [address] [place], [place]  [address]    Culture   Final    NO GROWTH Performed at [facility], [place] [address] [place], [place] [address]    Report Status 08/22/2022 FINAL  Final  Urine Culture (for pregnant, neutropenic or urologic patients or  patients with an indwelling urinary catheter)     Status: None   Collection Time: 08/22/22  4:59 AM   Specimen: Urine, Clean Catch  Result Value Ref Range Status   Specimen Description   Final    URINE, CLEAN CATCH Performed at [facility], [place] [address], [place], [place] [address]    Special Requests   Final    NONE Performed at [facility], [place] [address], [place], [place] [address]    Culture   Final    NO GROWTH Performed at [facility], [place] [address] [place], [place] [address]    Report Status 08/23/2022 FINAL  Final     Time coordinating discharge:  I have spent 35 minutes face to face with the patient and on the ward discussing the patients care, assessment, plan and disposition with other care givers. >50% of the time was devoted counseling the patient about the risks and benefits of treatment/Discharge disposition and coordinating care.   SIGNED:   Damita Lack, MD  [facility] 08/29/2022, 11:58 AM   If 7PM-7AM, please contact night-coverage

## 2022-08-29 NOTE — TOC Transition Note (Signed)
Transition of Care [facility]) - CM/SW Discharge Note   Patient Details  Name: Whitney Sloan MRN: [id number] Date of Birth: 1956/02/21  Transition of Care [facility]) CM/SW Contact:  Dessa Phi, RN Phone Number: 08/29/2022, 3:28 PM   Clinical Narrative: d/c today to [facility].PTAR already called. See TOC prior note.      Final next level of care: [place] Barriers to Discharge:  (If [facility] can accept today;auth ends today.)   Patient Goals and CMS Choice      Discharge Placement                         Discharge Plan and Services Additional resources added to the After Visit Summary for                                       Social Determinants of Health (SDOH) Interventions SDOH Screenings   Food Insecurity: No Food Insecurity (08/21/2022)  Housing: [place]  (08/21/2022)  Transportation Needs: No Transportation Needs (08/21/2022)  Utilities: Not At Risk (08/21/2022)  Depression (PHQ2-9): Low Risk  (03/24/2021)  Tobacco Use: Low Risk  (08/20/2022)     Readmission Risk Interventions    09/02/2021    3:21 PM  Readmission Risk Prevention Plan  Post Dischage Appt Complete  Medication Screening Complete  Transportation Screening Complete

## 2022-08-29 NOTE — Care Management Important Message (Signed)
Important Message  Patient Details IM Letter given. Name: Whitney Sloan MRN: [id number] Date of Birth: 11-29-1955   Medicare Important Message Given:  Yes     Kerin Salen 08/29/2022, 1:04 PM

## 2022-08-29 NOTE — Progress Notes (Signed)
Physical Therapy Treatment Patient Details Name: Whitney Sloan MRN: [id number] DOB: 1955-07-17 Today's Date: 08/29/2022   History of Present Illness 67 yo female admitted with cellultis, sepsis, fall at home, c/o bil LE pain. (-) fx, (+) joint effusion, pseudogout? per ortho consult. Hx of DM, CHF, morbid obesity    PT Comments    Pt up in recliner on arrival and appears in good spirits.  Pt ambulated in hallway and pleased with improved distance.  Pt also performed a few exercises sitting in recliner.  Pt anticipates d/c to SNF today.    Recommendations for follow up therapy are one component of a multi-disciplinary discharge planning process, led by the attending physician.  Recommendations may be updated based on patient status, additional functional criteria and insurance authorization.  Follow Up Recommendations  Skilled nursing-short term rehab (<3 hours/day)     Assistance Recommended at Discharge Intermittent Supervision/Assistance  Patient can return home with the following A little help with walking and/or transfers;A little help with bathing/dressing/bathroom;Assistance with cooking/housework;Assist for transportation;Help with stairs or ramp for entrance   Equipment Recommendations  Rolling walker (2 wheels)    Recommendations for Other Services       Precautions / Restrictions Precautions Precautions: Fall     Mobility  Bed Mobility               General bed mobility comments: pt in recliner    Transfers Overall transfer level: Needs assistance Equipment used: Rolling walker (2 wheels) Transfers: Sit to/from Stand Sit to Stand: Min guard           General transfer comment: verbal cues for hand placement, min/guard for safety    Ambulation/Gait Ambulation/Gait assistance: Min guard Gait Distance (Feet): 100 Feet Assistive device: Rolling walker (2 wheels) Gait Pattern/deviations: Step-through pattern, Decreased stride length Gait velocity:  decr     General Gait Details: slow cautious gait, pt reports mild pain in right leg and encouraged to take more weight through UEs on RW, able to improve ambulation distance today   Stairs             Wheelchair Mobility    Modified Rankin (Stroke Patients Only)       Balance                                            Cognition Arousal/Alertness: Awake/alert Behavior During Therapy: WFL for tasks assessed/performed Overall Cognitive Status: Within Functional Limits for tasks assessed                                          Exercises General Exercises - Lower Extremity Ankle Circles/Pumps: AROM, Both, Seated, 10 reps Long Arc Quad: AROM, Both, 10 reps, Seated Hip ABduction/ADduction: AROM, Both, 10 reps, Seated Hip Flexion/Marching: AROM, Seated, Both, 10 reps    General Comments        Pertinent Vitals/Pain Pain Assessment Pain Assessment: Faces Faces Pain Scale: Hurts little more Pain Location: right leg Pain Descriptors / Indicators: Discomfort, Sore Pain Intervention(s): Repositioned, Monitored during session    Home Living                          Prior Function  PT Goals (current goals can now be found in the care plan section) Progress towards PT goals: Progressing toward goals    Frequency    Min 2X/week      PT Plan Current plan remains appropriate    Co-evaluation              AM-PAC PT "6 Clicks" Mobility   Outcome Measure  Help needed turning from your back to your side while in a flat bed without using bedrails?: A Little Help needed moving from lying on your back to sitting on the side of a flat bed without using bedrails?: A Little Help needed moving to and from a bed to a chair (including a wheelchair)?: A Little Help needed standing up from a chair using your arms (e.g., wheelchair or bedside chair)?: A Little Help needed to walk in hospital room?: A  Little Help needed climbing 3-5 steps with a railing? : A Lot 6 Click Score: 17    End of Session Equipment Utilized During Treatment: Gait belt Activity Tolerance: Patient tolerated treatment well Patient left: in chair;with call bell/phone within reach;with chair alarm set Nurse Communication: Mobility status PT Visit Diagnosis: Muscle weakness (generalized) (M62.81);Difficulty in walking, not elsewhere classified (R26.2)     Time: [id number] PT Time Calculation (min) (ACUTE ONLY): 11 min  Charges:  $Gait Training: 8-22 mins                    Arlyce Dice, DPT Physical Therapist Acute Rehabilitation Services Preferred contact method: Secure Chat Weekend Pager Only: [phone] Office: [place] 08/29/2022, 12:54 PM

## 2022-08-29 NOTE — TOC Progression Note (Addendum)
Transition of Care [facility]) - Progression Note    Patient Details  Name: FANTASIA NEWGENT MRN: [id number] Date of Birth: January 25, 1956  Transition of Care [facility]) CM/SW Contact  Kiffany Schelling, Juliann Pulse, RN Phone Number: 08/29/2022, 9:23 AM  Clinical Narrative:  [facility] which ends today. Awaiting [facility] rep Freda Munro if can accept today.  -12:28p-going to [facility] ([facility]) rep Kitty accepted,has auth,going to rm#[id number],report tel#[phone]. PTAR called. No further CM needs.    Expected Discharge Plan: Skilled Nursing Facility Barriers to Discharge:  (If [facility] can accept today;auth ends today.)  Expected Discharge Plan and Services         Expected Discharge Date: 08/29/22                                     Social Determinants of Health (SDOH) Interventions SDOH Screenings   Food Insecurity: No Food Insecurity (08/21/2022)  Housing: Low Risk  (08/21/2022)  Transportation Needs: No Transportation Needs (08/21/2022)  Utilities: Not At Risk (08/21/2022)  Depression (PHQ2-9): Low Risk  (03/24/2021)  Tobacco Use: Low Risk  (08/20/2022)    Readmission Risk Interventions    09/02/2021    3:21 PM  Readmission Risk Prevention Plan  Post Dischage Appt Complete  Medication Screening Complete  Transportation Screening Complete

## 2022-09-01 ENCOUNTER — Ambulatory Visit: Payer: Medicare Other | Admitting: Internal Medicine

## 2022-09-13 ENCOUNTER — Ambulatory Visit: Payer: 59 | Admitting: Internal Medicine

## 2022-09-13 ENCOUNTER — Encounter: Payer: Self-pay | Admitting: Internal Medicine

## 2022-09-15 ENCOUNTER — Other Ambulatory Visit: Payer: Self-pay | Admitting: Internal Medicine

## 2022-09-16 LAB — COMPLETE METABOLIC PANEL WITH GFR
AG Ratio: 0.9 (calc) — ABNORMAL LOW (ref 1.0–2.5)
ALT: 8 U/L (ref 6–29)
AST: 10 U/L (ref 10–35)
Albumin: 3.5 g/dL — ABNORMAL LOW (ref 3.6–5.1)
Alkaline phosphatase (APISO): 69 U/L (ref 37–153)
BUN: 22 mg/dL (ref 7–25)
CO2: 26 mmol/L (ref 20–32)
Calcium: 9.3 mg/dL (ref 8.6–10.4)
Chloride: 110 mmol/L (ref 98–110)
Creat: 0.78 mg/dL (ref 0.50–1.05)
Globulin: 3.7 g/dL (calc) (ref 1.9–3.7)
Glucose, Bld: 70 mg/dL (ref 65–99)
Potassium: 3.6 mmol/L (ref 3.5–5.3)
Sodium: 146 mmol/L (ref 135–146)
Total Bilirubin: 0.3 mg/dL (ref 0.2–1.2)
Total Protein: 7.2 g/dL (ref 6.1–8.1)
eGFR: 83 mL/min/{1.73_m2} (ref >=60)

## 2022-09-16 LAB — CBC
HCT: 35 % (ref 35.0–45.0)
Hemoglobin: 11 g/dL — ABNORMAL LOW (ref 11.7–15.5)
MCH: 26.5 pg — ABNORMAL LOW (ref 27.0–33.0)
MCHC: 31.4 g/dL — ABNORMAL LOW (ref 32.0–36.0)
MCV: 84.3 fL (ref 80.0–100.0)
MPV: 11.3 fL (ref 7.5–12.5)
Platelets: 314 10*3/uL (ref 140–400)
RBC: 4.15 10*6/uL (ref 3.80–5.10)
RDW: 14.3 % (ref 11.0–15.0)
WBC: 7.9 10*3/uL (ref 3.8–10.8)

## 2022-09-16 LAB — FOLATE: Folate: 7 ng/mL

## 2022-09-16 LAB — LIPID PANEL
Cholesterol: 160 mg/dL (ref <200)
HDL: 50 mg/dL (ref >=50)
LDL Cholesterol (Calc): 92 mg/dL (calc)
Non-HDL Cholesterol (Calc): 110 mg/dL (calc) (ref <130)
Total CHOL/HDL Ratio: 3.2 (calc) (ref <5.0)
Triglycerides: 86 mg/dL (ref <150)

## 2022-09-16 LAB — VITAMIN D 25 HYDROXY (VIT D DEFICIENCY, FRACTURES): Vit D, 25-Hydroxy: 40 ng/mL (ref 30–100)

## 2022-09-16 LAB — TSH: TSH: 2.03 mIU/L (ref 0.40–4.50)

## 2022-09-16 LAB — VITAMIN B12: Vitamin B-12: 485 pg/mL (ref 200–1100)

## 2022-09-16 LAB — URIC ACID: Uric Acid, Serum: 7.2 mg/dL — ABNORMAL HIGH (ref 2.5–7.0)

## 2022-09-22 ENCOUNTER — Encounter (HOSPITAL_COMMUNITY): Payer: Self-pay

## 2022-09-22 ENCOUNTER — Emergency Department (HOSPITAL_COMMUNITY)
Admission: EM | Admit: 2022-09-22 | Discharge: 2022-09-22 | Disposition: A | Discharge status: Home / Self Care | Payer: 59 | Attending: Emergency Medicine | Admitting: Emergency Medicine

## 2022-09-22 DIAGNOSIS — E11649 Type 2 diabetes mellitus with hypoglycemia without coma: Secondary | ICD-10-CM | POA: Insufficient documentation

## 2022-09-22 DIAGNOSIS — I10 Essential (primary) hypertension: Secondary | ICD-10-CM | POA: Insufficient documentation

## 2022-09-22 DIAGNOSIS — Z7982 Long term (current) use of aspirin: Secondary | ICD-10-CM | POA: Insufficient documentation

## 2022-09-22 DIAGNOSIS — Z794 Long term (current) use of insulin: Secondary | ICD-10-CM | POA: Insufficient documentation

## 2022-09-22 DIAGNOSIS — E162 Hypoglycemia, unspecified: Secondary | ICD-10-CM

## 2022-09-22 LAB — CBG MONITORING, ED
Glucose-Capillary: 146 mg/dL — ABNORMAL HIGH (ref 70–99)
Glucose-Capillary: 164 mg/dL — ABNORMAL HIGH (ref 70–99)
Glucose-Capillary: 157 mg/dL — ABNORMAL HIGH (ref 70–99)
Glucose-Capillary: 89 mg/dL (ref 70–99)

## 2022-09-22 NOTE — ED Triage Notes (Signed)
Patient arrived via [facility] due to hypoglycemia, EMS responded to patient about an hour prior to arrival who states patient was diaphoretic and CBG 45, she reports she took her Lantus and had not eaten. Ate dinner and given glucose strip from EMS and CBG resolved to 96, patient refused transport.  Called back asking to come to the hospital to be monitored overnight. EMS states CBG 160 when they arrived.

## 2022-09-22 NOTE — ED Provider Notes (Addendum)
[place] EMERGENCY DEPARTMENT AT [facility] Provider Note   CSN: [id number] Arrival date & time: 09/22/22  0104     History  Chief Complaint  Patient presents with   Hypoglycemia    Whitney Sloan is a 67 y.o. female with past medical history hypertension, hyperlipidemia, type 2 diabetes who presents to the ED for hypoglycemia.  Patient reports that she took her insulin last night without having dinner and she was in the kitchen with her daughter when she became generally weak and confused prompting her daughter to call EMS.  When EMS arrived, blood glucose was 45. Pt given food and glucose strip with resolution of hypoglycemia. Pt initially refused transport then called EMS back requesting to be transported to ED for "observation overnight." Unfortunately, extensive wait time and on my initial evaluation after pt waiting to be seen in ED for several hours, she tells me she is not currently having any symptoms and requested discharge home. She denies chest pain, shortness of breath, abdominal pain, n/v/d, lightheadedness, dizziness, weakness, headache, vision changes, or any other complaints. At baseline, she ambulates with a walker. She states on my initial interview she currently feels in her normal state of health and has a "diabetic doctor" she is trying to schedule an appointment to follow up with.       Home Medications Prior to Admission medications   Medication Sig Start Date End Date Taking? Authorizing Provider  acetaminophen (TYLENOL) 500 MG tablet Take 500 mg by mouth every 6 (six) hours as needed for moderate pain or headache.    [provider]  allopurinol (ZYLOPRIM) 100 MG tablet Take 1 tablet (100 mg total) by mouth daily. 08/29/22   Amin, Loura HaltAnkit Chirag, MD  aspirin (ASPIR-LOW) 81 MG EC tablet Take 81 mg by mouth every morning.    [provider]  atorvastatin (LIPITOR) 20 MG tablet Take 1 tablet (20 mg total) by mouth every evening. 08/30/21    Shamleffer, Konrad DoloresIbtehal Jaralla, MD  B-D UF III MINI PEN NEEDLES 31G X 5 MM MISC SMARTSIG:injection 3 Times Daily 01/18/22   [provider]  carvedilol (COREG) 25 MG tablet Take 1 tablet (25 mg total) by mouth 2 (two) times daily. 12/18/20 08/20/22  Albertine GratesXu, Fang, MD  Chlorphen-Phenyleph-ASA (ALKA-SELTZER PLUS COLD PO) Take 2 tablets by mouth every 6 (six) hours as needed (cold symptoms).    [provider]  colchicine 0.6 MG tablet Take 1 tablet (0.6 mg total) by mouth daily for 5 days. 08/29/22 09/03/22  Amin, Loura HaltAnkit Chirag, MD  Continuous Blood Gluc Sensor (DEXCOM G7 SENSOR) MISC 1 Device by Does not apply route as directed. 03/02/22   Shamleffer, Konrad DoloresIbtehal Jaralla, MD  cyanocobalamin (VITAMIN B12) 1000 MCG tablet Take 1 tablet (1,000 mcg total) by mouth daily. 08/29/22   Amin, Loura HaltAnkit Chirag, MD  dapagliflozin propanediol (FARXIGA) 10 MG TABS tablet Take 1 tablet (10 mg total) by mouth daily before breakfast. 03/02/22   Shamleffer, Konrad DoloresIbtehal Jaralla, MD  Diclofenac Sodium 3 % GEL Apply 2 g topically 2 (two) times daily as needed. Patient taking differently: Apply 2 g topically 2 (two) times daily as needed (pain). 04/10/20   Nestor RampNeal, Sara L, MD  ELDERBERRY PO Take 1 tablet by mouth daily as needed (immune support). Gummy    [provider]  Ensure Max Protein (ENSURE MAX PROTEIN) LIQD Take 330 mLs (11 oz total) by mouth 2 (two) times daily. 08/29/22   Amin, Loura HaltAnkit Chirag, MD  ezetimibe (ZETIA) 10 MG  tablet Take 10 mg by mouth at bedtime. 08/21/19   [provider]  furosemide (LASIX) 20 MG tablet Take 1 tablet (20 mg total) by mouth daily. 08/29/22   Amin, Loura Halt, MD  hydrALAZINE (APRESOLINE) 25 MG tablet Take 3 tablets (75 mg total) by mouth every 8 (eight) hours. 08/29/22   Amin, Loura Halt, MD  insulin aspart (NOVOLOG) 100 UNIT/ML injection Inject 0-15 Units into the skin 3 (three) times daily with meals. 08/29/22   Amin, Loura Halt, MD  insulin aspart (NOVOLOG) 100 UNIT/ML  injection Inject 8 Units into the skin 2 (two) times daily with breakfast and lunch. 08/29/22   Amin, Loura Halt, MD  insulin aspart (NOVOLOG) 100 UNIT/ML injection Inject 6 Units into the skin daily before supper. 08/29/22   Amin, Loura Halt, MD  insulin glargine-yfgn (SEMGLEE) 100 UNIT/ML injection Inject 0.42 mLs (42 Units total) into the skin daily. 08/29/22   Amin, Loura Halt, MD  Insulin Pen Needle 32G X 4 MM MISC 1 Device by Does not apply route as directed. 03/26/20   Shamleffer, Konrad Dolores, MD  losartan (COZAAR) 100 MG tablet Take 1 tablet (100 mg total) by mouth daily. 08/30/21   Shamleffer, Konrad Dolores, MD  polyethylene glycol (MIRALAX / GLYCOLAX) 17 g packet Take 17 g by mouth daily as needed for mild constipation.    [provider]  Sennosides (EX-LAX) 15 MG TABS Take 15 mg by mouth daily as needed (constipation).    [provider]  tamsulosin (FLOMAX) 0.4 MG CAPS capsule Take 0.4 mg by mouth daily. 07/08/22   [provider]  VITAMIN D, CHOLECALCIFEROL, PO Take 1 capsule by mouth daily.    [provider]      Allergies    Oxycodone-acetaminophen    Review of Systems   Review of Systems  All other systems reviewed and are negative.   Physical Exam Updated Vital Signs BP (!) 161/76   Pulse 69   Temp 97.6 F (36.4 C) (Oral)   Resp 16   SpO2 97%  Physical Exam Vitals and nursing note reviewed.  Constitutional:      General: She is not in acute distress.    Appearance: Normal appearance. She is not ill-appearing, toxic-appearing or diaphoretic.  HENT:     Head: Normocephalic and atraumatic.     Mouth/Throat:     Mouth: Mucous membranes are moist.     Pharynx: Oropharynx is clear. No oropharyngeal exudate or posterior oropharyngeal erythema.  Eyes:     General: No scleral icterus.    Extraocular Movements: Extraocular movements intact.     Conjunctiva/sclera: Conjunctivae normal.     Pupils: Pupils are equal, round, and  reactive to light.  Cardiovascular:     Rate and Rhythm: Normal rate and regular rhythm.     Heart sounds: No murmur heard. Pulmonary:     Effort: Pulmonary effort is normal. No respiratory distress.     Breath sounds: Normal breath sounds. No stridor. No wheezing, rhonchi or rales.  Abdominal:     General: Abdomen is flat. There is no distension.     Palpations: Abdomen is soft.     Tenderness: There is no abdominal tenderness. There is no guarding or rebound.  Musculoskeletal:        General: No deformity. Normal range of motion.     Cervical back: Neck supple.     Right lower leg: No edema.     Left lower leg: No edema.  Skin:  General: Skin is warm and dry.     Capillary Refill: Capillary refill takes less than 2 seconds.     Coloration: Skin is not jaundiced or pale.  Neurological:     Mental Status: She is alert and oriented to person, place, and time.     GCS: GCS eye subscore is 4. GCS verbal subscore is 5. GCS motor subscore is 6.     Cranial Nerves: Cranial nerves 2-12 are intact. No cranial nerve deficit, dysarthria or facial asymmetry.     Sensory: Sensation is intact.     Motor: Motor function is intact. No weakness, tremor, atrophy, abnormal muscle tone or seizure activity.     Coordination: Coordination is intact.     Gait: Gait is intact.  Psychiatric:        Mood and Affect: Mood normal.        Behavior: Behavior normal.        Thought Content: Thought content normal.        Judgment: Judgment normal.     ED Results / Procedures / Treatments   Labs (all labs ordered are listed, but only abnormal results are displayed) Labs Reviewed  CBG MONITORING, ED - Abnormal; Notable for the following components:      Result Value   Glucose-Capillary 146 (*)    All other components within normal limits  CBG MONITORING, ED - Abnormal; Notable for the following components:   Glucose-Capillary 164 (*)    All other components within normal limits  CBG MONITORING, ED -  Abnormal; Notable for the following components:   Glucose-Capillary 157 (*)    All other components within normal limits  CBG MONITORING, ED    EKG None  Radiology No results found.  Procedures Procedures   Medications Ordered in ED Medications - No data to display  ED Course/ Medical Decision Making/ A&P                             Medical Decision Making  Medical Decision Making:   Whitney Sloan is a 67 y.o. female who presented to the ED today with hypoglycemia detailed above.    Patient's presentation is complicated by their history of diabetes, HTN, HLD.  Patient placed on continuous vitals and telemetry monitoring while in ED which was reviewed periodically.  Complete initial physical exam performed, notably the patient was in no acute distress. Neurologically intact. Abdomen soft and nontender. No signs of dehydration. Normal heart and lung exam. .    Reviewed and confirmed nursing documentation for past medical history, family history, social history.    Initial Assessment:   With the patient's presentation of hypoglycemia, differential diagnosis includes but is not limited to medication reaction, ACS, arrhythmia, sepsis, renal failure, liver failure, electrolyte disturbance, acute kidney injury, dehydration, CVA/TIA, seizure. This is most consistent with an acute complicated illness  Initial Plan:  CBG monitoring Ambulation trial PO trial Additional labs/EKG/workup - declined by patient Objective evaluation as below reviewed    Final Assessment and Plan:   This is a 67 year old female who presents to ED c/o hypoglycemia. Known history of diabetes on multiple medications including insulin. Notes she took regular dose of insulin last night without meal when briefly became altered prompting family member to contact EMS where pt was found diaphoretic and with glucose in 40s that resolved with PO intake/glucose. Pt initially declined transport but later decided to  come to ED for  further evaluation. Unfortunately, extensive wait time in ED and pt not evaluated until the morning and on my initial interview with her she states she has been asymptomatic since evaluation last night, believe episode caused by missing meal while taking insulin, and requests to be discharged home. She is asymptomatic, neurologically intact. Hypertensive but otherwise vital signs within normal limits. No more hypoglycemia since ED arrival. Tolerating PO without difficulty. Benign exam. Ambulating at her baseline. Discussed with pt extensively recommendations for further testing in ED today, however, she politely declines and would like to be discharged while family is available to pick her up. Discussed with pt importance of regular meals, close monitoring of blood glucose at home, and follow up with primary care and/or endocrinology to review medication regimen and any further necessary interventions. Pt agrees to follow up outpatient. Strict ED return precautions given, all questions answered, and pt stable at time of discharge home.    Clinical Impression:  1. Hypoglycemia      Discharge    Final Clinical Impression(s) / ED Diagnoses Final diagnoses:  Hypoglycemia    Rx / DC Orders ED Discharge Orders     None         Tonette Lederer, PA-C 09/22/22 1829    Tonette Lederer, PA-C 09/22/22 1830    Jacalyn Lefevre, MD 09/23/22 [id number]

## 2022-09-22 NOTE — ED Notes (Signed)
Ambulated with walker. No issues

## 2022-09-22 NOTE — Discharge Instructions (Addendum)
Thank you for letting us take care of you today.   Please continue to carefully monitor your blood sugar at home. I recommend you take this before administering any insulin and administer medication as recommended by your doctor or hold if sugars are normal. Please call your PCP to discuss your visit today and schedule an appointment to be seen ASAP to discuss further control of your blood sugars and any recommended changes to your medications.  Please call your diabetes specialist/endocrinologist and schedule an appointment ASAP.  It is important to eat regular meals to maintain your blood sugar. Please eat and drink regularly to better control your blood sugars. For any recurrent or worsening symptoms such as lethargy, confusion, weakness, chest pain, shortness of breath, or other concerning symptoms, please return to nearest emergency department for re-evaluation.

## 2022-09-22 NOTE — ED Notes (Signed)
Pt. CBG 89, RN made aware.

## 2022-09-27 ENCOUNTER — Telehealth: Payer: Self-pay

## 2022-09-27 NOTE — Telephone Encounter (Signed)
Whitney Sloan that nurse with [facility]  was at the home with patient today and states that patient is not taking medication and sugars have been between 80-200 range. Patient has not been checking blood sugar are taking medication as she should. She states that patient has Lantus and Novolog and that she has not been taking because she has been bottom out per patient.  Patient has not been eating as she should. Would like to know if we can fit patient in or adjust medications. Patient is not on [place] at all.   [phone]

## 2022-09-28 NOTE — Telephone Encounter (Signed)
Spoke with Cathlean Cower the nurse and advised per message below.

## 2022-10-07 ENCOUNTER — Encounter (HOSPITAL_COMMUNITY): Payer: Self-pay

## 2022-10-07 ENCOUNTER — Emergency Department (HOSPITAL_COMMUNITY)
Admission: EM | Admit: 2022-10-07 | Discharge: 2022-10-07 | Disposition: A | Discharge status: Home / Self Care | Payer: 59 | Attending: Emergency Medicine | Admitting: Emergency Medicine

## 2022-10-07 ENCOUNTER — Other Ambulatory Visit: Payer: Self-pay

## 2022-10-07 ENCOUNTER — Emergency Department (HOSPITAL_COMMUNITY): Payer: 59

## 2022-10-07 DIAGNOSIS — R7989 Other specified abnormal findings of blood chemistry: Secondary | ICD-10-CM | POA: Diagnosis not present

## 2022-10-07 DIAGNOSIS — I509 Heart failure, unspecified: Secondary | ICD-10-CM | POA: Insufficient documentation

## 2022-10-07 DIAGNOSIS — Z7982 Long term (current) use of aspirin: Secondary | ICD-10-CM | POA: Diagnosis not present

## 2022-10-07 DIAGNOSIS — Z794 Long term (current) use of insulin: Secondary | ICD-10-CM | POA: Insufficient documentation

## 2022-10-07 DIAGNOSIS — R2243 Localized swelling, mass and lump, lower limb, bilateral: Secondary | ICD-10-CM | POA: Insufficient documentation

## 2022-10-07 DIAGNOSIS — R600 Localized edema: Secondary | ICD-10-CM

## 2022-10-07 LAB — COMPREHENSIVE METABOLIC PANEL
ALT: 12 U/L (ref 0–44)
AST: 17 U/L (ref 15–41)
Albumin: 3.6 g/dL (ref 3.5–5.0)
Alkaline Phosphatase: 67 U/L (ref 38–126)
Anion gap: 11 (ref 5–15)
BUN: 20 mg/dL (ref 8–23)
CO2: 24 mmol/L (ref 22–32)
Calcium: 8.8 mg/dL — ABNORMAL LOW (ref 8.9–10.3)
Chloride: 106 mmol/L (ref 98–111)
Creatinine, Ser: 0.91 mg/dL (ref 0.44–1.00)
GFR, Estimated: >60 mL/min (ref >60)
Glucose, Bld: 94 mg/dL (ref 70–99)
Potassium: 4 mmol/L (ref 3.5–5.1)
Sodium: 141 mmol/L (ref 135–145)
Total Bilirubin: 0.3 mg/dL (ref 0.3–1.2)
Total Protein: 7.4 g/dL (ref 6.5–8.1)

## 2022-10-07 LAB — CBC WITH DIFFERENTIAL/PLATELET
Abs Immature Granulocytes: 0.01 10*3/uL (ref 0.00–0.07)
Basophils Absolute: 0 10*3/uL (ref 0.0–0.1)
Basophils Relative: 0 %
Eosinophils Absolute: 0.2 10*3/uL (ref 0.0–0.5)
Eosinophils Relative: 2 %
HCT: 36.3 % (ref 36.0–46.0)
Hemoglobin: 11.1 g/dL — ABNORMAL LOW (ref 12.0–15.0)
Immature Granulocytes: 0 %
Lymphocytes Relative: 31 %
Lymphs Abs: 2.8 10*3/uL (ref 0.7–4.0)
MCH: 26.5 pg (ref 26.0–34.0)
MCHC: 30.6 g/dL (ref 30.0–36.0)
MCV: 86.6 fL (ref 80.0–100.0)
Monocytes Absolute: 0.8 10*3/uL (ref 0.1–1.0)
Monocytes Relative: 9 %
Neutro Abs: 5 10*3/uL (ref 1.7–7.7)
Neutrophils Relative %: 58 %
Platelets: 228 10*3/uL (ref 150–400)
RBC: 4.19 MIL/uL (ref 3.87–5.11)
RDW: 16.7 % — ABNORMAL HIGH (ref 11.5–15.5)
WBC: 8.8 10*3/uL (ref 4.0–10.5)
nRBC: 0 % (ref 0.0–0.2)

## 2022-10-07 LAB — URINALYSIS, ROUTINE W REFLEX MICROSCOPIC
Bilirubin Urine: NEGATIVE
Glucose, UA: >=500 mg/dL — AB
Hgb urine dipstick: NEGATIVE
Ketones, ur: NEGATIVE mg/dL
Leukocytes,Ua: NEGATIVE
Nitrite: NEGATIVE
Protein, ur: NEGATIVE mg/dL
Specific Gravity, Urine: 1.005 (ref 1.005–1.030)
pH: 5 (ref 5.0–8.0)

## 2022-10-07 LAB — BRAIN NATRIURETIC PEPTIDE: B Natriuretic Peptide: 217.6 pg/mL — ABNORMAL HIGH (ref 0.0–100.0)

## 2022-10-07 LAB — TROPONIN I (HIGH SENSITIVITY)
Troponin I (High Sensitivity): 17 ng/L (ref <18)
Troponin I (High Sensitivity): 14 ng/L (ref <18)

## 2022-10-07 MED ORDER — FUROSEMIDE 10 MG/ML IJ SOLN
20.0000 mg | Freq: Once | INTRAMUSCULAR | Status: AC
  Administered 2022-10-07: 20 mg via INTRAVENOUS
  Filled 2022-10-07: qty 4

## 2022-10-07 NOTE — ED Provider Notes (Signed)
[place] EMERGENCY DEPARTMENT AT [facility] Provider Note   CSN: [id number] Arrival date & time: 10/07/22  1810     History  Chief Complaint  Patient presents with   Leg Swelling    Whitney Sloan is a 67 y.o. female with CHF presented with 1 day of bilateral leg swelling and 10 pound weight gain.  Patient had her body last night for dinner and started noticing leg swelling and called her cardiologist who told her to take 2 of her furosemide.  Patient took the 2 torsemide last night and 2 this morning still endorses swelling.  Patient denies any skin color changes or pain.  Patient denies any nausea vomiting or diarrhea.  Patient denies any fevers, chest pain, shortness of breath, congestion, cough, skin color changes, changes sensation/motor skills  Home Medications Prior to Admission medications   Medication Sig Start Date End Date Taking? Authorizing Provider  acetaminophen (TYLENOL) 500 MG tablet Take 500 mg by mouth every 6 (six) hours as needed for moderate pain or headache.    [provider]  allopurinol (ZYLOPRIM) 100 MG tablet Take 1 tablet (100 mg total) by mouth daily. 08/29/22   Amin, Loura Halt, MD  aspirin (ASPIR-LOW) 81 MG EC tablet Take 81 mg by mouth every morning.    [provider]  atorvastatin (LIPITOR) 20 MG tablet Take 1 tablet (20 mg total) by mouth every evening. 08/30/21   Shamleffer, Konrad Dolores, MD  B-D UF III MINI PEN NEEDLES 31G X 5 MM MISC SMARTSIG:injection 3 Times Daily 01/18/22   [provider]  carvedilol (COREG) 25 MG tablet Take 1 tablet (25 mg total) by mouth 2 (two) times daily. 12/18/20 08/20/22  Albertine Grates, MD  Chlorphen-Phenyleph-ASA (ALKA-SELTZER PLUS COLD PO) Take 2 tablets by mouth every 6 (six) hours as needed (cold symptoms).    [provider]  colchicine 0.6 MG tablet Take 1 tablet (0.6 mg total) by mouth daily for 5 days. 08/29/22 09/03/22  Amin, Loura Halt, MD  Continuous Blood Gluc  Sensor (DEXCOM G7 SENSOR) MISC 1 Device by Does not apply route as directed. 03/02/22   Shamleffer, Konrad Dolores, MD  cyanocobalamin (VITAMIN B12) 1000 MCG tablet Take 1 tablet (1,000 mcg total) by mouth daily. 08/29/22   Amin, Loura Halt, MD  dapagliflozin propanediol (FARXIGA) 10 MG TABS tablet Take 1 tablet (10 mg total) by mouth daily before breakfast. 03/02/22   Shamleffer, Konrad Dolores, MD  Diclofenac Sodium 3 % GEL Apply 2 g topically 2 (two) times daily as needed. Patient taking differently: Apply 2 g topically 2 (two) times daily as needed (pain). 04/10/20   Nestor Ramp, MD  ELDERBERRY PO Take 1 tablet by mouth daily as needed (immune support). Gummy    [provider]  Ensure Max Protein (ENSURE MAX PROTEIN) LIQD Take 330 mLs (11 oz total) by mouth 2 (two) times daily. 08/29/22   Amin, Loura Halt, MD  ezetimibe (ZETIA) 10 MG tablet Take 10 mg by mouth at bedtime. 08/21/19   [provider]  furosemide (LASIX) 20 MG tablet Take 1 tablet (20 mg total) by mouth daily. 08/29/22   Amin, Loura Halt, MD  hydrALAZINE (APRESOLINE) 25 MG tablet Take 3 tablets (75 mg total) by mouth every 8 (eight) hours. 08/29/22   Amin, Loura Halt, MD  insulin aspart (NOVOLOG) 100 UNIT/ML injection Inject 0-15 Units into the skin 3 (three) times daily with meals. 08/29/22   Dimple Nanas, MD  insulin aspart (  NOVOLOG) 100 UNIT/ML injection Inject 8 Units into the skin 2 (two) times daily with breakfast and lunch. 08/29/22   Amin, Loura Halt, MD  insulin aspart (NOVOLOG) 100 UNIT/ML injection Inject 6 Units into the skin daily before supper. 08/29/22   Amin, Loura Halt, MD  insulin glargine-yfgn (SEMGLEE) 100 UNIT/ML injection Inject 0.42 mLs (42 Units total) into the skin daily. 08/29/22   Amin, Loura Halt, MD  Insulin Pen Needle 32G X 4 MM MISC 1 Device by Does not apply route as directed. 03/26/20   Shamleffer, Konrad Dolores, MD  losartan (COZAAR) 100 MG tablet Take 1 tablet (100  mg total) by mouth daily. 08/30/21   Shamleffer, Konrad Dolores, MD  polyethylene glycol (MIRALAX / GLYCOLAX) 17 g packet Take 17 g by mouth daily as needed for mild constipation.    [provider]  Sennosides (EX-LAX) 15 MG TABS Take 15 mg by mouth daily as needed (constipation).    [provider]  tamsulosin (FLOMAX) 0.4 MG CAPS capsule Take 0.4 mg by mouth daily. 07/08/22   [provider]  VITAMIN D, CHOLECALCIFEROL, PO Take 1 capsule by mouth daily.    [provider]      Allergies    Oxycodone-acetaminophen    Review of Systems   Review of Systems See HPI Physical Exam Updated Vital Signs BP 125/72   Pulse 68   Temp 98.1 F (36.7 C) (Oral)   Resp 17   Wt 105 kg   SpO2 92%   BMI 36.26 kg/m  Physical Exam Constitutional:      General: She is not in acute distress. Cardiovascular:     Rate and Rhythm: Normal rate and regular rhythm.     Heart sounds: Normal heart sounds. No murmur heard.    Comments: Unable to palpate pulses due to edema however nursing was able to get a pulse of 70 bpm Pulmonary:     Effort: Pulmonary effort is normal. No respiratory distress.     Breath sounds: Normal breath sounds.  Abdominal:     Palpations: Abdomen is soft.     Tenderness: There is no abdominal tenderness. There is no guarding or rebound.  Musculoskeletal:     Right lower leg: Edema present.     Left lower leg: Edema present.     Comments: Bilateral 2+ pitting edema Able to range bilateral ankles  Skin:    General: Skin is warm and dry.     Capillary Refill: Capillary refill takes less than 2 seconds.     Comments: No overlying skin color changes  Neurological:     Mental Status: She is alert.     Comments: Sensation intact in all 4 limbs  Psychiatric:        Mood and Affect: Mood normal.     ED Results / Procedures / Treatments   Labs (all labs ordered are listed, but only abnormal results are displayed) Labs Reviewed   COMPREHENSIVE METABOLIC PANEL - Abnormal; Notable for the following components:      Result Value   Calcium 8.8 (*)    All other components within normal limits  CBC WITH DIFFERENTIAL/PLATELET - Abnormal; Notable for the following components:   Hemoglobin 11.1 (*)    RDW 16.7 (*)    All other components within normal limits  BRAIN NATRIURETIC PEPTIDE - Abnormal; Notable for the following components:   B Natriuretic Peptide 217.6 (*)    All other components within normal limits  URINALYSIS, ROUTINE W REFLEX  MICROSCOPIC - Abnormal; Notable for the following components:   Color, Urine COLORLESS (*)    Glucose, UA >=500 (*)    Bacteria, UA RARE (*)    All other components within normal limits  TROPONIN I (HIGH SENSITIVITY)  TROPONIN I (HIGH SENSITIVITY)    EKG None  Radiology DG Chest 2 View  Result Date: 10/07/2022 CLINICAL DATA:  Bilateral leg swelling EXAM: CHEST - 2 VIEW COMPARISON:  08/21/2022 FINDINGS: Cardiomegaly with mild central congestion. No acute airspace disease or pleural effusion. No visible pneumothorax. IMPRESSION: Cardiomegaly with mild central congestion. Electronically Signed   By: Jasmine Pang M.D.   On: 10/07/2022 19:27    Procedures Procedures    Medications Ordered in ED Medications  furosemide (LASIX) injection 20 mg (20 mg Intravenous Given 10/07/22 2119)    ED Course/ Medical Decision Making/ A&P                             Medical Decision Making Amount and/or Complexity of Data Reviewed Labs: ordered. Radiology: ordered.  Risk Prescription drug management.   HARLEY MCCARTNEY 67 y.o. presented today for bilateral leg swelling. Working DDx that I considered at this time includes, but not limited to, HF exacerbation, liver cirrhosis, cellulitis, varicose veins, ischemic limb, lymphedema.  R/o DDx: liver cirrhosis, cellulitis, varicose veins, ischemic limb, lymphedema: These are considered less likely due to history of present illness and  physical exam findings  Review of prior external notes: 09/22/2022 ED provider  Unique Tests and My Interpretation:  Chest x-ray: Cardiomegaly with mild central congestion EKG: Sinus 76 bpm, similar to previous reading CBC: Unremarkable CMP: Unremarkable BNP: Elevated 217.6, up from previous result Troponin: 17, 14 UA: Negative  Discussion with Independent Historian:  Daughter  Discussion of Management of Tests:  None  Risk: Medium: prescription drug management  Risk Stratification Score: None  Staffed with Lynelle Doctor, MD  Plan: Patient presented for bilateral leg edema. On exam patient was no acute distress and stable vitals.  Patient did have bilateral 2+ pitting edema but did have good sensation and motor skill distally.  I was unable to palpate pulses due to edema but patient did have good cap refill.  No overlying skin color changes indicative of cellulitis.  Patient is afebrile and does not meet sepsis criteria.  Patient does admit to having a salty meal last night from the hibachi she had and has tried upping her Lasix dosage however patient still endorses edema.  Labs and imaging will be ordered.  Patient stable this time.  Patient's labs came back significant for slightly increased BNP of 217.6 which is up from previous result chest x-ray did not show any pleural effusions but did show vascular congestion.  Patient's renal function is good.  At this time patient was given IV Lasix to diurese her and will be monitored.  Patient most likely be discharged after diuresis if symptoms improve with cardiology follow-up.  After receiving Lasix patient states she felt much better.  Patient be discharged with outpatient follow-up with her cardiologist and I recommended she continue taking her Lasix as prescribed from her cardiologist.  I spoke to the patient about avoiding salty foods including hibachi foods as this would exacerbate her symptoms.  I spoke to the patient about how her BNP was  elevated today but only slightly and that this is not a CHF exacerbation but that she needs to monitor her salt intake.  Patient verbalized  understanding.  Patient was given return precautions. Patient stable for discharge at this time.  Patient verbalized understanding of plan.         Final Clinical Impression(s) / ED Diagnoses Final diagnoses:  Bilateral lower extremity edema  Elevated brain natriuretic peptide (BNP) level    Rx / DC Orders ED Discharge Orders     None         Remi Deter 10/07/22 2215    Linwood Dibbles, MD 10/07/22 2242

## 2022-10-07 NOTE — ED Triage Notes (Signed)
BIBA from home c/o bilateral lower extremity edema and reports 10lbs weight gain in 24hrs.  Hx CHF.  Recent increase in torsemide dosage.  Denies sob/cp.  Lungs clear

## 2022-10-07 NOTE — ED Notes (Signed)
Pt to bathroom via wheel chair, pt can stand and shuffle, pt not steady on her feet, pt back to bed, monitor replaced.

## 2022-10-07 NOTE — ED Provider Triage Note (Signed)
Emergency Medicine Provider Triage Evaluation Note  VAUDINE DUTAN , a 67 y.o. female  was evaluated in triage.  Pt complains of bilateral leg swelling.  Patient had a biopsy yesterday and from yesterday till today had a 10 pound weight increase with increase in leg swelling.  Patient currently is on torsemide that was recently increased.  Patient denies chest pain or shortness of breath or productive cough.  Patient denies skin color changes or fevers.  Review of Systems  Positive: See HPI Negative: See HPI  Physical Exam  BP (!) 152/71 (BP Location: Left Arm)   Pulse 68   Temp 98.1 F (36.7 C) (Oral)   Resp 16   Wt 105 kg   SpO2 96%   BMI 36.26 kg/m  Gen:   Awake, no distress   Resp:  Normal effort  MSK:   Moves extremities without difficulty  Other:  2+ bilateral pitting edema, lungs clear to auscultation bilaterally sensation intact distally along with good motor skills, pulses cannot be palpated due to edema  Medical Decision Making  Medically screening exam initiated at 6:28 PM.  Appropriate orders placed.  NURA CAHOON was informed that the remainder of the evaluation will be completed by another provider, this initial triage assessment does not replace that evaluation, and the importance of remaining in the ED until their evaluation is complete.  Workup initiated, patient stable this time   Remi Deter 10/07/22 1834

## 2022-10-07 NOTE — Discharge Instructions (Signed)
Please follow-up with your cardiologist regarding recent symptoms and ER visit.  Today your labs were reassuring but you did have an elevated BNP that would be suggestive of heart failure exacerbation.  Your chest x-ray did not show any fluid in your lungs and the rest your labs are reassuring.  Please avoid hibachi food or other salty foods until you are able to see your cardiologist.  Please continue take your Lasix as prescribed and monitor your symptoms.  If you begin to have skin color changes, pain with the swelling, change in sensation, etc. please return to ER.

## 2022-11-08 ENCOUNTER — Other Ambulatory Visit: Payer: Self-pay | Admitting: Internal Medicine

## 2022-11-09 LAB — BASIC METABOLIC PANEL WITH GFR
BUN: 19 mg/dL (ref 7–25)
CO2: 24 mmol/L (ref 20–32)
Calcium: 8.9 mg/dL (ref 8.6–10.4)
Chloride: 108 mmol/L (ref 98–110)
Creat: 0.87 mg/dL (ref 0.50–1.05)
Glucose, Bld: 180 mg/dL — ABNORMAL HIGH (ref 65–99)
Potassium: 3.8 mmol/L (ref 3.5–5.3)
Sodium: 144 mmol/L (ref 135–146)
eGFR: 73 mL/min/{1.73_m2} (ref >=60)

## 2022-11-09 LAB — BRAIN NATRIURETIC PEPTIDE: Brain Natriuretic Peptide: 205 pg/mL — ABNORMAL HIGH (ref <100)

## 2022-11-09 LAB — EXTRA LAV TOP TUBE

## 2022-11-17 ENCOUNTER — Other Ambulatory Visit: Payer: Self-pay | Admitting: Internal Medicine

## 2022-11-24 ENCOUNTER — Other Ambulatory Visit (HOSPITAL_COMMUNITY): Payer: Self-pay

## 2023-03-15 ENCOUNTER — Encounter: Payer: Self-pay | Admitting: Internal Medicine

## 2023-03-15 ENCOUNTER — Ambulatory Visit (INDEPENDENT_AMBULATORY_CARE_PROVIDER_SITE_OTHER): Payer: 59 | Admitting: Internal Medicine

## 2023-03-15 VITALS — BP 124/80 | HR 70 | Ht 67.0 in | Wt 235.8 lb

## 2023-03-15 DIAGNOSIS — E1142 Type 2 diabetes mellitus with diabetic polyneuropathy: Secondary | ICD-10-CM | POA: Diagnosis not present

## 2023-03-15 DIAGNOSIS — Z7985 Long-term (current) use of injectable non-insulin antidiabetic drugs: Secondary | ICD-10-CM

## 2023-03-15 DIAGNOSIS — Z794 Long term (current) use of insulin: Secondary | ICD-10-CM

## 2023-03-15 DIAGNOSIS — Z7984 Long term (current) use of oral hypoglycemic drugs: Secondary | ICD-10-CM

## 2023-03-15 DIAGNOSIS — E1165 Type 2 diabetes mellitus with hyperglycemia: Secondary | ICD-10-CM

## 2023-03-15 DIAGNOSIS — E1129 Type 2 diabetes mellitus with other diabetic kidney complication: Secondary | ICD-10-CM | POA: Diagnosis not present

## 2023-03-15 DIAGNOSIS — R809 Proteinuria, unspecified: Secondary | ICD-10-CM

## 2023-03-15 LAB — POCT GLYCOSYLATED HEMOGLOBIN (HGB A1C): Hemoglobin A1C: 10.5 % — AB (ref 4.0–5.6)

## 2023-03-15 MED ORDER — INSULIN PEN NEEDLE 32G X 4 MM MISC: 1.0000 | 3 refills | Status: AC

## 2023-03-15 MED ORDER — BD PEN NEEDLE MINI U/F 31G X 5 MM MISC: 1.0000 | Freq: Four times a day (QID) | 3 refills | Status: AC

## 2023-03-15 MED ORDER — OZEMPIC (0.25 OR 0.5 MG/DOSE) 2 MG/1.5ML ~~LOC~~ SOPN: 0.5000 mg | PEN_INJECTOR | SUBCUTANEOUS | 3 refills | Status: AC

## 2023-03-15 MED ORDER — DAPAGLIFLOZIN PROPANEDIOL 10 MG PO TABS: 10.0000 mg | ORAL_TABLET | Freq: Every day | ORAL | 3 refills | Status: AC

## 2023-03-15 MED ORDER — NOVOLOG FLEXPEN 100 UNIT/ML ~~LOC~~ SOPN: 16.0000 [IU] | PEN_INJECTOR | Freq: Three times a day (TID) | SUBCUTANEOUS | 2 refills | Status: AC

## 2023-03-15 MED ORDER — METFORMIN HCL ER 500 MG PO TB24: 500.0000 mg | ORAL_TABLET | Freq: Every day | ORAL | 2 refills | Status: AC

## 2023-03-15 NOTE — Patient Instructions (Signed)
-   Start Ozempic 0.25 mg once weekly for 6 weeks, than increase to 0.5 mg weekly - Continue Lantus  80 units once daily - Continue Metformin 500 mg , 1 tablet daily  - Decrease Novolog 16 units with each meal  - Continue Farxiga 10 mg daily     HOW TO TREAT LOW BLOOD SUGARS (Blood sugar LESS THAN 70 MG/DL) Please follow the RULE OF 15 for the treatment of hypoglycemia treatment (when your (blood sugars are less than 70 mg/dL)   STEP 1: Take 15 grams of carbohydrates when your blood sugar is low, which includes:  3-4 GLUCOSE TABS  OR 3-4 OZ OF JUICE OR REGULAR SODA OR ONE TUBE OF GLUCOSE GEL    STEP 2: RECHECK blood sugar in 15 MINUTES STEP 3: If your blood sugar is still low at the 15 minute recheck --> then, go back to STEP 1 and treat AGAIN with another 15 grams of carbohydrates.

## 2023-03-15 NOTE — Progress Notes (Signed)
Name: Whitney Sloan  Age/ Sex: 67 y.o., female   MRN/ DOB: [id number], 09/10/1955     PCP: Fleet Contras, MD   Reason for Endocrinology Evaluation: Type 2 Diabetes Mellitus  Initial Endocrine Consultative Visit: 09/10/2018    PATIENT IDENTIFIER: Whitney Sloan is a 67 y.o. female with a past medical history of HTN, T2DM and Dyslipidemia. The patient has followed with Endocrinology clinic since 09/10/2018 for consultative assistance with management of her diabetes.  DIABETIC HISTORY:  Whitney Sloan was diagnosed with T2DM in 2008, she has been on Metformin, Glipizide, Januvia and Actos in the past.She has been on insulin therapy for years. Her hemoglobin A1c has ranged from 8.0% in 2011, peaking at 12.5% in 2010.  On her initial visit to our clinic her A1c was 11.0% , she was on lantus, Invokana, Metfomin and Pioglitazone but she was not taking them. Bydureon was added.   Invokana was stopped by the patient by 06/2019 due to recurrent yeast infections Prandial insulin added 07/2019  Rubelsus causes vomiting 07/2020  SUBJECTIVE:   During the last visit (02/13/2022): A1c 13.0% .    Today (03/15/2023): Whitney Sloan is here for a follow up on diabetes..  She has NOT been to our clinic in a year. She has been checking glucose occasionally . The patient has not had hypoglycemic episodes since the last clinic visit.   Denies nausea or vomiting  Denies constipation or diarrhea     HOME DIABETES REGIMEN:  Lantus  80 units daily Novolog 20 units with each meal Metformin 500 mg daily  Farxiga 10 mg daily - not taking       METER DOWNLOAD SUMMARY:  106-291 mg/dL     HISTORY:  Past Medical History:  Past Medical History:  Diagnosis Date   Abnormal stress test 12/31/2019   medium defect of moderate severity present in the basal inferior, mid inferior and apex location.  Findings consistent with ischemia   Chronic diastolic (congestive) heart failure (HCC)    Diabetes mellitus     Hyperlipemia    Hypertension    Psoriasis    Past Surgical History:  Past Surgical History:  Procedure Laterality Date   ANAL FISSURE REPAIR     ENDOMETRIAL ABLATION     Social History:  reports that she has never smoked. She has been exposed to tobacco smoke. She has never used smokeless tobacco. She reports that she does not drink alcohol and does not use drugs. Family History:  Family History  Problem Relation Age of Onset   Dementia Mother    Renal Disease Father    Diabetes Other      HOME MEDICATIONS: Allergies as of 03/15/2023       Reactions   Oxycodone-acetaminophen Nausea And Vomiting        Medication List        Accurate as of March 15, 2023  2:45 PM. If you have any questions, ask your nurse or doctor.          acetaminophen 500 MG tablet Commonly known as: TYLENOL Take 500 mg by mouth every 6 (six) hours as needed for moderate pain or headache.   ALKA-SELTZER PLUS COLD PO Take 2 tablets by mouth every 6 (six) hours as needed (cold symptoms).   allopurinol 100 MG tablet Commonly known as: ZYLOPRIM Take 1 tablet (100 mg total) by mouth daily.   amLODipine-olmesartan 10-40 MG tablet Commonly known as: AZOR Take 1 tablet by mouth daily.  Aspir-Low 81 MG tablet Generic drug: aspirin EC Take 81 mg by mouth every morning.   atorvastatin 20 MG tablet Commonly known as: LIPITOR Take 1 tablet (20 mg total) by mouth every evening.   atorvastatin 80 MG tablet Commonly known as: LIPITOR Take 80 mg by mouth at bedtime.   carvedilol 25 MG tablet Commonly known as: Coreg Take 1 tablet (25 mg total) by mouth 2 (two) times daily.   cephALEXin 500 MG capsule Commonly known as: KEFLEX Take 500 mg by mouth 3 (three) times daily.   colchicine 0.6 MG tablet Take 1 tablet (0.6 mg total) by mouth daily for 5 days.   cyanocobalamin 1000 MCG tablet Commonly known as: VITAMIN B12 Take 1 tablet (1,000 mcg total) by mouth daily.   dapagliflozin  propanediol 10 MG Tabs tablet Commonly known as: Farxiga Take 1 tablet (10 mg total) by mouth daily before breakfast.   Dexcom G7 Sensor Misc 1 Device by Does not apply route as directed.   Diclofenac Sodium 3 % Gel Apply 2 g topically 2 (two) times daily as needed. What changed: reasons to take this   ELDERBERRY PO Take 1 tablet by mouth daily as needed (immune support). Gummy   Ensure Max Protein Liqd Take 330 mLs (11 oz total) by mouth 2 (two) times daily.   eplerenone 25 MG tablet Commonly known as: INSPRA Take 25 mg by mouth daily.   Ex-Lax 15 MG Tabs Generic drug: Sennosides Take 15 mg by mouth daily as needed (constipation).   ezetimibe 10 MG tablet Commonly known as: ZETIA Take 10 mg by mouth at bedtime.   furosemide 20 MG tablet Commonly known as: LASIX Take 1 tablet (20 mg total) by mouth daily.   furosemide 40 MG tablet Commonly known as: LASIX Take 40-80 mg by mouth daily.   gabapentin 100 MG capsule Commonly known as: NEURONTIN Take 100 mg by mouth 3 (three) times daily.   hydrALAZINE 25 MG tablet Commonly known as: APRESOLINE Take 3 tablets (75 mg total) by mouth every 8 (eight) hours.   hydrALAZINE 50 MG tablet Commonly known as: APRESOLINE Take 50 mg by mouth 3 (three) times daily.   insulin aspart 100 UNIT/ML injection Commonly known as: novoLOG Inject 0-15 Units into the skin 3 (three) times daily with meals. What changed: Another medication with the same name was changed. Make sure you understand how and when to take each.   insulin aspart 100 UNIT/ML injection Commonly known as: novoLOG Inject 8 Units into the skin 2 (two) times daily with breakfast and lunch. What changed: Another medication with the same name was changed. Make sure you understand how and when to take each.   insulin aspart 100 UNIT/ML injection Commonly known as: novoLOG Inject 6 Units into the skin daily before supper. What changed: how much to take   insulin  glargine-yfgn 100 UNIT/ML injection Commonly known as: SEMGLEE Inject 0.42 mLs (42 Units total) into the skin daily.   Insulin Pen Needle 32G X 4 MM Misc 1 Device by Does not apply route as directed.   B-D UF III MINI PEN NEEDLES 31G X 5 MM Misc Generic drug: Insulin Pen Needle SMARTSIG:injection 3 Times Daily   Lantus SoloStar 100 UNIT/ML Solostar Pen Generic drug: insulin glargine 80 Units daily.   losartan 100 MG tablet Commonly known as: COZAAR Take 1 tablet (100 mg total) by mouth daily.   metFORMIN 500 MG tablet Commonly known as: GLUCOPHAGE Take 500 mg by mouth daily.  polyethylene glycol 17 g packet Commonly known as: MIRALAX / GLYCOLAX Take 17 g by mouth daily as needed for mild constipation.   tamsulosin 0.4 MG Caps capsule Commonly known as: FLOMAX Take 0.4 mg by mouth daily.   torsemide 20 MG tablet Commonly known as: DEMADEX Take 20 mg by mouth 2 (two) times daily.   VITAMIN D (CHOLECALCIFEROL) PO Take 1 capsule by mouth daily.         OBJECTIVE:   Vital Signs: BP 124/80 (BP Location: Left Arm, Patient Position: Sitting, Cuff Size: Large)   Pulse 70   Ht 5\' 7"  (1.702 m)   Wt 235 lb 12.8 oz (107 kg)   SpO2 99%   BMI 36.93 kg/m   Wt Readings from Last 3 Encounters:  03/15/23 235 lb 12.8 oz (107 kg)  10/07/22 231 lb 7.7 oz (105 kg)  08/29/22 234 lb 12.6 oz (106.5 kg)     Exam: General: Pt appears well and is in NAD  Lungs: CTA   Heart: RRR  Extremities: Trace  pretibial edema.  Neuro: MS is good with appropriate affect, pt is alert and Ox3    DM Foot Exam 03/15/2023 The skin of the feet is without sores or ulcerations, toe deformity noted  The pedal pulses 2+ B/L The sensation is absent  to a screening 5.07, 10 gram monofilament bilaterally       DATA REVIEWED:  Lab Results  Component Value Date   HGBA1C 10.5 (A) 03/15/2023   HGBA1C 13.9 (H) 08/21/2022   HGBA1C 10.3 (A) 03/02/2022    Latest Reference Range & Units 11/08/22  00:00  Sodium 135 - 146 mmol/L 144  Potassium 3.5 - 5.3 mmol/L 3.8  Chloride 98 - 110 mmol/L 108  CO2 20 - 32 mmol/L 24  Glucose 65 - 99 mg/dL 161 (H)  BUN 7 - 25 mg/dL 19  Creatinine 0.96 - 0.45 mg/dL 4.09  Calcium 8.6 - 81.1 mg/dL 8.9  BUN/Creatinine Ratio 6 - 22 (calc) SEE NOTE:  eGFR > OR = 60 mL/min/1.44m2 73  (H): Data is abnormally high   ASSESSMENT / PLAN / RECOMMENDATIONS:   1) Type 2 Diabetes Mellitus,Poorly controlled, With neuropathic complications and microalbuminuria- Most recent A1c of 10.5 %. Goal A1c < 7.0 %.     -Patient continues with persistent hyperglycemia -Patient with multiple social determinants -I have attempted to prescribe CGM technology multiple times in the past, but the patient is not able to get it -I do believe that she has no help at home to help her manage her medications -Intolerant to Rybelsus and INvokana,  - Intolerant to higher doses of metformin  -I will start her on Ozempic, she was trained by my assistant on injection technique -I will decrease her NovoLog preemptively to prevent hypoglycemia  MEDICATIONS: -Continue Lantus  80 units daily   -Continue metformin 500 mg  daily -Decrease NovoLog  16 units with each meal -Continue Farxiga 10 mg daily  -Start Ozempic 0.25 mg once weekly for 6 weeks, then increase to 0.5 mg weekly   EDUCATION / INSTRUCTIONS: BG monitoring instructions: Patient is instructed to check her blood sugars 3 times a day, before meals Call [facility] if: BG persistently < 70  I reviewed the Rule of 15 for the treatment of hypoglycemia in detail with the patient. Literature supplied.     F/U in 4 months     Signed electronically by: Lyndle Herrlich, MD   Endocrinology  [facility]  [address], [address] [place], [place] [address] Phone: [phone] FAX: [phone]   CC: Fleet Contras, MD [address] [place] [address] Phone: [phone]   Fax: [phone]  Return to Endocrinology clinic as below: No future appointments.

## 2023-03-16 ENCOUNTER — Telehealth: Payer: Self-pay

## 2023-03-16 NOTE — Telephone Encounter (Signed)
Samples of this drug (Ozempic) were given to the patient, quantity 1, Lot Number [id number] exp. Date: 04/12/24

## 2023-03-22 ENCOUNTER — Telehealth: Payer: Self-pay

## 2023-03-22 NOTE — Telephone Encounter (Signed)
Attempted to call patient and sent mychart message

## 2023-03-22 NOTE — Telephone Encounter (Signed)
Patient left vm stating that after during research on Ozempic she doesn't feel comfortable taking it and wants to know what are her other options.

## 2023-03-23 NOTE — Telephone Encounter (Signed)
Patient aware and states that she will be transferring her care to [facility].

## 2023-03-23 NOTE — Telephone Encounter (Signed)
Attempted to contact patient no answer and vm full.

## 2023-03-23 NOTE — Telephone Encounter (Signed)
Attempted to contact patient.

## 2023-05-09 LAB — EXTERNAL GENERIC LAB PROCEDURE: COLOGUARD: NEGATIVE

## 2023-05-09 LAB — COLOGUARD: COLOGUARD: NEGATIVE

## 2023-05-29 ENCOUNTER — Other Ambulatory Visit: Payer: Self-pay | Admitting: Internal Medicine

## 2023-07-18 ENCOUNTER — Ambulatory Visit: Payer: 59 | Admitting: Internal Medicine

## 2023-08-25 ENCOUNTER — Other Ambulatory Visit: Payer: Self-pay | Admitting: Internal Medicine

## 2023-11-21 ENCOUNTER — Other Ambulatory Visit: Payer: Self-pay | Admitting: Internal Medicine

## 2023-12-24 ENCOUNTER — Other Ambulatory Visit: Payer: Self-pay | Admitting: Internal Medicine

## 2023-12-25 ENCOUNTER — Encounter: Payer: Self-pay | Admitting: Internal Medicine

## 2023-12-25 ENCOUNTER — Telehealth: Payer: Self-pay

## 2023-12-25 NOTE — Telephone Encounter (Signed)
Contact patient to schedule follow up

## 2024-01-16 ENCOUNTER — Other Ambulatory Visit: Payer: Self-pay | Admitting: Internal Medicine
